# Patient Record
Sex: Male | Born: 1935 | Race: White | Hispanic: No | Marital: Married | Smoking: Never smoker
Health system: Southern US, Community
[De-identification: ages and names within clinical notes are randomized; demographics above are authoritative.]

## PROBLEM LIST (undated history)

## (undated) DIAGNOSIS — K5790 Diverticulosis of intestine, part unspecified, without perforation or abscess without bleeding: Secondary | ICD-10-CM

## (undated) DIAGNOSIS — I471 Supraventricular tachycardia, unspecified: Secondary | ICD-10-CM

## (undated) DIAGNOSIS — R001 Bradycardia, unspecified: Secondary | ICD-10-CM

## (undated) DIAGNOSIS — K589 Irritable bowel syndrome without diarrhea: Secondary | ICD-10-CM

## (undated) DIAGNOSIS — N400 Enlarged prostate without lower urinary tract symptoms: Secondary | ICD-10-CM

## (undated) DIAGNOSIS — E7800 Pure hypercholesterolemia, unspecified: Secondary | ICD-10-CM

## (undated) DIAGNOSIS — N200 Calculus of kidney: Secondary | ICD-10-CM

## (undated) DIAGNOSIS — M25552 Pain in left hip: Secondary | ICD-10-CM

## (undated) DIAGNOSIS — M1990 Unspecified osteoarthritis, unspecified site: Secondary | ICD-10-CM

## (undated) DIAGNOSIS — D369 Benign neoplasm, unspecified site: Secondary | ICD-10-CM

## (undated) DIAGNOSIS — I2510 Atherosclerotic heart disease of native coronary artery without angina pectoris: Secondary | ICD-10-CM

## (undated) DIAGNOSIS — I5022 Chronic systolic (congestive) heart failure: Secondary | ICD-10-CM

## (undated) DIAGNOSIS — I10 Essential (primary) hypertension: Secondary | ICD-10-CM

## (undated) DIAGNOSIS — N183 Chronic kidney disease, stage 3 unspecified: Secondary | ICD-10-CM

## (undated) DIAGNOSIS — I219 Acute myocardial infarction, unspecified: Secondary | ICD-10-CM

## (undated) DIAGNOSIS — I255 Ischemic cardiomyopathy: Secondary | ICD-10-CM

## (undated) DIAGNOSIS — K219 Gastro-esophageal reflux disease without esophagitis: Secondary | ICD-10-CM

## (undated) DIAGNOSIS — G4730 Sleep apnea, unspecified: Secondary | ICD-10-CM

## (undated) HISTORY — DX: Pure hypercholesterolemia, unspecified: E78.00

## (undated) HISTORY — DX: Benign neoplasm, unspecified site: D36.9

## (undated) HISTORY — PX: TRANSURETHRAL RESECTION OF PROSTATE: SHX73

## (undated) HISTORY — DX: Atherosclerotic heart disease of native coronary artery without angina pectoris: I25.10

## (undated) HISTORY — PX: BACK SURGERY: SHX140

## (undated) HISTORY — DX: Pain in left hip: M25.552

## (undated) HISTORY — DX: Acute myocardial infarction, unspecified: I21.9

## (undated) HISTORY — PX: APPENDECTOMY: SHX54

## (undated) HISTORY — PX: FINGER SURGERY: SHX640

## (undated) HISTORY — DX: Benign prostatic hyperplasia without lower urinary tract symptoms: N40.0

## (undated) HISTORY — DX: Ischemic cardiomyopathy: I25.5

## (undated) HISTORY — DX: Supraventricular tachycardia, unspecified: I47.10

## (undated) HISTORY — PX: JOINT REPLACEMENT: SHX530

## (undated) HISTORY — DX: Unspecified osteoarthritis, unspecified site: M19.90

## (undated) HISTORY — DX: Calculus of kidney: N20.0

## (undated) HISTORY — DX: Irritable bowel syndrome, unspecified: K58.9

## (undated) HISTORY — DX: Diverticulosis of intestine, part unspecified, without perforation or abscess without bleeding: K57.90

## (undated) HISTORY — DX: Supraventricular tachycardia: I47.1

## (undated) HISTORY — PX: TONSILLECTOMY: SUR1361

---

## 1998-05-25 ENCOUNTER — Other Ambulatory Visit: Admission: RE | Admit: 1998-05-25 | Discharge: 1998-05-25 | Payer: Self-pay | Admitting: Urology

## 1999-01-25 ENCOUNTER — Ambulatory Visit (HOSPITAL_BASED_OUTPATIENT_CLINIC_OR_DEPARTMENT_OTHER): Admission: RE | Admit: 1999-01-25 | Discharge: 1999-01-25 | Payer: Self-pay | Admitting: Surgery

## 1999-09-26 ENCOUNTER — Emergency Department (HOSPITAL_COMMUNITY): Admission: EM | Admit: 1999-09-26 | Discharge: 1999-09-26 | Payer: Self-pay | Admitting: Emergency Medicine

## 1999-10-02 ENCOUNTER — Ambulatory Visit (HOSPITAL_COMMUNITY): Admission: RE | Admit: 1999-10-02 | Discharge: 1999-10-02 | Payer: Self-pay | Admitting: Orthopedic Surgery

## 1999-10-02 ENCOUNTER — Encounter: Payer: Self-pay | Admitting: Orthopedic Surgery

## 1999-10-12 ENCOUNTER — Inpatient Hospital Stay (HOSPITAL_COMMUNITY): Admission: RE | Admit: 1999-10-12 | Discharge: 1999-10-13 | Payer: Self-pay | Admitting: Neurosurgery

## 1999-10-12 ENCOUNTER — Encounter: Payer: Self-pay | Admitting: Neurosurgery

## 1999-12-17 DIAGNOSIS — I219 Acute myocardial infarction, unspecified: Secondary | ICD-10-CM

## 1999-12-17 HISTORY — DX: Acute myocardial infarction, unspecified: I21.9

## 2000-10-28 ENCOUNTER — Ambulatory Visit (HOSPITAL_COMMUNITY): Admission: RE | Admit: 2000-10-28 | Discharge: 2000-10-28 | Payer: Self-pay | Admitting: Cardiology

## 2000-10-28 ENCOUNTER — Encounter: Payer: Self-pay | Admitting: Cardiology

## 2000-12-16 HISTORY — PX: ANGIOPLASTY: SHX39

## 2001-01-29 ENCOUNTER — Encounter: Payer: Self-pay | Admitting: Urology

## 2001-01-29 ENCOUNTER — Encounter: Admission: RE | Admit: 2001-01-29 | Discharge: 2001-01-29 | Payer: Self-pay | Admitting: Urology

## 2001-05-21 ENCOUNTER — Encounter (INDEPENDENT_AMBULATORY_CARE_PROVIDER_SITE_OTHER): Payer: Self-pay | Admitting: Specialist

## 2001-05-21 ENCOUNTER — Other Ambulatory Visit: Admission: RE | Admit: 2001-05-21 | Discharge: 2001-05-21 | Payer: Self-pay | Admitting: Internal Medicine

## 2001-10-11 ENCOUNTER — Encounter: Payer: Self-pay | Admitting: Cardiology

## 2001-10-11 ENCOUNTER — Inpatient Hospital Stay (HOSPITAL_COMMUNITY): Admission: EM | Admit: 2001-10-11 | Discharge: 2001-10-17 | Payer: Self-pay | Admitting: Emergency Medicine

## 2001-10-27 ENCOUNTER — Encounter (HOSPITAL_COMMUNITY): Admission: RE | Admit: 2001-10-27 | Discharge: 2002-01-25 | Payer: Self-pay | Admitting: *Deleted

## 2002-01-26 ENCOUNTER — Encounter (HOSPITAL_COMMUNITY): Admission: RE | Admit: 2002-01-26 | Discharge: 2002-01-28 | Payer: Self-pay | Admitting: *Deleted

## 2002-02-12 ENCOUNTER — Encounter: Payer: Self-pay | Admitting: Emergency Medicine

## 2002-02-13 ENCOUNTER — Inpatient Hospital Stay (HOSPITAL_COMMUNITY): Admission: EM | Admit: 2002-02-13 | Discharge: 2002-02-16 | Payer: Self-pay | Admitting: Emergency Medicine

## 2002-02-14 ENCOUNTER — Encounter: Payer: Self-pay | Admitting: Cardiovascular Disease

## 2002-04-18 ENCOUNTER — Emergency Department (HOSPITAL_COMMUNITY): Admission: EM | Admit: 2002-04-18 | Discharge: 2002-04-18 | Payer: Self-pay | Admitting: Emergency Medicine

## 2002-04-20 ENCOUNTER — Encounter: Admission: RE | Admit: 2002-04-20 | Discharge: 2002-04-20 | Payer: Self-pay | Admitting: Urology

## 2002-04-20 ENCOUNTER — Encounter: Payer: Self-pay | Admitting: Urology

## 2002-05-07 ENCOUNTER — Encounter: Payer: Self-pay | Admitting: Emergency Medicine

## 2002-05-07 ENCOUNTER — Inpatient Hospital Stay (HOSPITAL_COMMUNITY): Admission: EM | Admit: 2002-05-07 | Discharge: 2002-05-08 | Payer: Self-pay | Admitting: Emergency Medicine

## 2002-05-07 HISTORY — PX: CARDIAC CATHETERIZATION: SHX172

## 2003-09-06 ENCOUNTER — Emergency Department (HOSPITAL_COMMUNITY): Admission: EM | Admit: 2003-09-06 | Discharge: 2003-09-06 | Payer: Self-pay | Admitting: Emergency Medicine

## 2005-01-02 ENCOUNTER — Ambulatory Visit (HOSPITAL_COMMUNITY): Admission: RE | Admit: 2005-01-02 | Discharge: 2005-01-02 | Payer: Self-pay | Admitting: Neurosurgery

## 2005-01-10 ENCOUNTER — Ambulatory Visit: Payer: Self-pay | Admitting: Internal Medicine

## 2005-01-11 ENCOUNTER — Ambulatory Visit: Payer: Self-pay | Admitting: Internal Medicine

## 2005-01-18 ENCOUNTER — Encounter: Admission: RE | Admit: 2005-01-18 | Discharge: 2005-02-14 | Payer: Self-pay | Admitting: Neurosurgery

## 2005-03-20 ENCOUNTER — Ambulatory Visit (HOSPITAL_COMMUNITY): Admission: RE | Admit: 2005-03-20 | Discharge: 2005-03-20 | Payer: Self-pay | Admitting: Neurosurgery

## 2005-10-03 ENCOUNTER — Ambulatory Visit: Payer: Self-pay | Admitting: Internal Medicine

## 2006-01-21 ENCOUNTER — Ambulatory Visit (HOSPITAL_COMMUNITY): Admission: RE | Admit: 2006-01-21 | Discharge: 2006-01-21 | Payer: Self-pay | Admitting: Neurosurgery

## 2006-04-29 ENCOUNTER — Ambulatory Visit (HOSPITAL_BASED_OUTPATIENT_CLINIC_OR_DEPARTMENT_OTHER): Admission: RE | Admit: 2006-04-29 | Discharge: 2006-04-29 | Payer: Self-pay | Admitting: Cardiology

## 2006-05-04 ENCOUNTER — Ambulatory Visit: Payer: Self-pay | Admitting: Internal Medicine

## 2006-09-04 ENCOUNTER — Encounter: Admission: RE | Admit: 2006-09-04 | Discharge: 2006-09-04 | Payer: Self-pay | Admitting: Neurosurgery

## 2006-09-30 ENCOUNTER — Encounter: Admission: RE | Admit: 2006-09-30 | Discharge: 2006-09-30 | Payer: Self-pay | Admitting: Neurosurgery

## 2006-12-15 ENCOUNTER — Ambulatory Visit: Payer: Self-pay | Admitting: Internal Medicine

## 2007-01-28 ENCOUNTER — Ambulatory Visit: Payer: Self-pay | Admitting: Internal Medicine

## 2007-02-03 ENCOUNTER — Ambulatory Visit: Payer: Self-pay | Admitting: Internal Medicine

## 2007-03-11 ENCOUNTER — Ambulatory Visit (HOSPITAL_COMMUNITY): Admission: RE | Admit: 2007-03-11 | Discharge: 2007-03-11 | Payer: Self-pay | Admitting: Internal Medicine

## 2007-03-17 ENCOUNTER — Ambulatory Visit: Payer: Self-pay | Admitting: Internal Medicine

## 2007-04-21 ENCOUNTER — Encounter: Admission: RE | Admit: 2007-04-21 | Discharge: 2007-04-21 | Payer: Self-pay | Admitting: Orthopedic Surgery

## 2007-05-12 ENCOUNTER — Encounter: Admission: RE | Admit: 2007-05-12 | Discharge: 2007-05-12 | Payer: Self-pay | Admitting: Orthopedic Surgery

## 2007-05-13 ENCOUNTER — Ambulatory Visit (HOSPITAL_BASED_OUTPATIENT_CLINIC_OR_DEPARTMENT_OTHER): Admission: RE | Admit: 2007-05-13 | Discharge: 2007-05-13 | Payer: Self-pay | Admitting: Orthopedic Surgery

## 2007-05-13 ENCOUNTER — Encounter (INDEPENDENT_AMBULATORY_CARE_PROVIDER_SITE_OTHER): Payer: Self-pay | Admitting: Orthopedic Surgery

## 2008-02-25 ENCOUNTER — Encounter: Payer: Self-pay | Admitting: *Deleted

## 2008-02-25 DIAGNOSIS — M6530 Trigger finger, unspecified finger: Secondary | ICD-10-CM | POA: Insufficient documentation

## 2008-02-25 DIAGNOSIS — N138 Other obstructive and reflux uropathy: Secondary | ICD-10-CM

## 2008-02-25 DIAGNOSIS — Z8669 Personal history of other diseases of the nervous system and sense organs: Secondary | ICD-10-CM | POA: Insufficient documentation

## 2008-02-25 DIAGNOSIS — Z9889 Other specified postprocedural states: Secondary | ICD-10-CM | POA: Insufficient documentation

## 2008-02-25 DIAGNOSIS — K5730 Diverticulosis of large intestine without perforation or abscess without bleeding: Secondary | ICD-10-CM

## 2008-02-25 DIAGNOSIS — N401 Enlarged prostate with lower urinary tract symptoms: Secondary | ICD-10-CM

## 2008-02-25 DIAGNOSIS — E785 Hyperlipidemia, unspecified: Secondary | ICD-10-CM | POA: Insufficient documentation

## 2008-02-25 DIAGNOSIS — Z9861 Coronary angioplasty status: Secondary | ICD-10-CM

## 2008-02-25 DIAGNOSIS — I219 Acute myocardial infarction, unspecified: Secondary | ICD-10-CM

## 2008-02-25 DIAGNOSIS — K219 Gastro-esophageal reflux disease without esophagitis: Secondary | ICD-10-CM

## 2008-02-25 DIAGNOSIS — I25119 Atherosclerotic heart disease of native coronary artery with unspecified angina pectoris: Secondary | ICD-10-CM

## 2008-02-25 DIAGNOSIS — Z9189 Other specified personal risk factors, not elsewhere classified: Secondary | ICD-10-CM

## 2008-02-25 DIAGNOSIS — Z9089 Acquired absence of other organs: Secondary | ICD-10-CM

## 2008-02-25 DIAGNOSIS — D126 Benign neoplasm of colon, unspecified: Secondary | ICD-10-CM

## 2008-02-25 DIAGNOSIS — I110 Hypertensive heart disease with heart failure: Secondary | ICD-10-CM

## 2008-02-25 DIAGNOSIS — R7309 Other abnormal glucose: Secondary | ICD-10-CM | POA: Insufficient documentation

## 2008-02-25 DIAGNOSIS — I471 Supraventricular tachycardia: Secondary | ICD-10-CM

## 2008-02-25 DIAGNOSIS — M479 Spondylosis, unspecified: Secondary | ICD-10-CM

## 2008-02-25 DIAGNOSIS — Z87442 Personal history of urinary calculi: Secondary | ICD-10-CM | POA: Insufficient documentation

## 2008-02-25 HISTORY — DX: Hypertensive heart disease with heart failure: I11.0

## 2008-02-25 HISTORY — DX: Other specified personal risk factors, not elsewhere classified: Z91.89

## 2008-02-25 HISTORY — DX: Gastro-esophageal reflux disease without esophagitis: K21.9

## 2008-02-25 HISTORY — DX: Acute myocardial infarction, unspecified: I21.9

## 2008-02-25 HISTORY — DX: Coronary angioplasty status: Z98.61

## 2008-02-25 HISTORY — DX: Benign neoplasm of colon, unspecified: D12.6

## 2008-02-25 HISTORY — DX: Atherosclerotic heart disease of native coronary artery with unspecified angina pectoris: I25.119

## 2008-02-25 HISTORY — DX: Personal history of urinary calculi: Z87.442

## 2008-02-25 HISTORY — DX: Other abnormal glucose: R73.09

## 2008-02-25 HISTORY — DX: Diverticulosis of large intestine without perforation or abscess without bleeding: K57.30

## 2008-02-25 HISTORY — DX: Acquired absence of other organs: Z90.89

## 2008-02-25 HISTORY — DX: Spondylosis, unspecified: M47.9

## 2008-02-25 HISTORY — DX: Hyperlipidemia, unspecified: E78.5

## 2008-02-25 HISTORY — DX: Other obstructive and reflux uropathy: N13.8

## 2008-02-25 HISTORY — DX: Trigger finger, unspecified finger: M65.30

## 2008-02-25 HISTORY — DX: Other specified postprocedural states: Z98.89

## 2008-10-31 ENCOUNTER — Telehealth: Payer: Self-pay | Admitting: Internal Medicine

## 2008-11-02 ENCOUNTER — Telehealth (INDEPENDENT_AMBULATORY_CARE_PROVIDER_SITE_OTHER): Payer: Self-pay | Admitting: *Deleted

## 2008-11-03 ENCOUNTER — Ambulatory Visit: Payer: Self-pay | Admitting: Internal Medicine

## 2008-11-03 DIAGNOSIS — R059 Cough, unspecified: Secondary | ICD-10-CM

## 2008-11-03 DIAGNOSIS — R05 Cough: Secondary | ICD-10-CM | POA: Insufficient documentation

## 2008-11-03 HISTORY — DX: Cough, unspecified: R05.9

## 2008-11-07 ENCOUNTER — Inpatient Hospital Stay (HOSPITAL_COMMUNITY): Admission: RE | Admit: 2008-11-07 | Discharge: 2008-11-10 | Payer: Self-pay | Admitting: Orthopedic Surgery

## 2008-11-08 ENCOUNTER — Telehealth: Payer: Self-pay | Admitting: Internal Medicine

## 2008-11-11 ENCOUNTER — Telehealth: Payer: Self-pay | Admitting: Internal Medicine

## 2009-05-18 ENCOUNTER — Ambulatory Visit (HOSPITAL_COMMUNITY): Admission: EM | Admit: 2009-05-18 | Discharge: 2009-05-18 | Payer: Self-pay | Admitting: Emergency Medicine

## 2009-06-14 ENCOUNTER — Ambulatory Visit (HOSPITAL_COMMUNITY): Admission: EM | Admit: 2009-06-14 | Discharge: 2009-06-15 | Payer: Self-pay | Admitting: Emergency Medicine

## 2009-07-11 ENCOUNTER — Telehealth: Payer: Self-pay | Admitting: Internal Medicine

## 2009-10-05 IMAGING — CR DG HIP 1V PORT*R*
1 series · 1 of 1 positions shown · non-contrast
Comparison: 10/27/2008

CLINICAL DATA: Postoperative status post right total hip
arthroplasty

PORTABLE RIGHT HIP - 1 VIEW

[view not recorded]
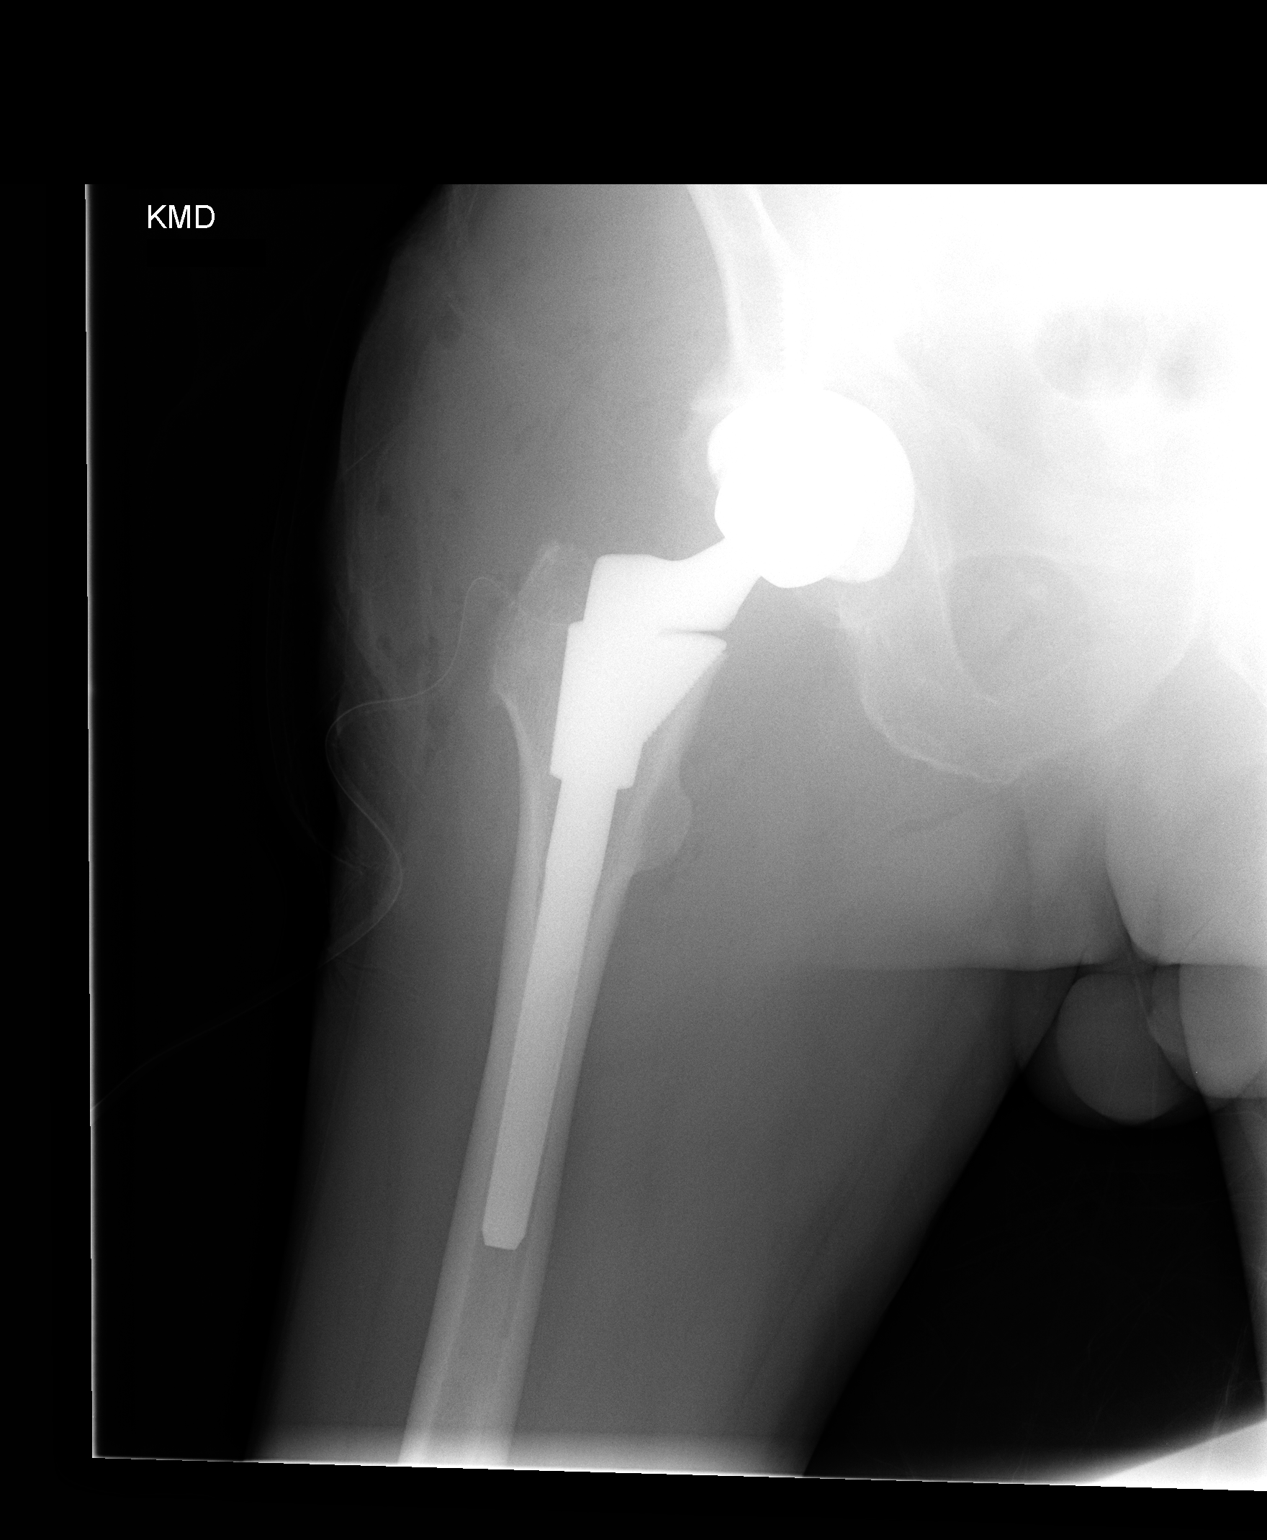

[1 of 1 positions shown; findings below may reference images not displayed]

FINDINGS: Right total hip arthroplasty noted without fracture line
or radiographic evidence for hardware failure.
IMPRESSION: Expected postoperative appearance after right total hip
arthroplasty.

## 2010-05-13 ENCOUNTER — Observation Stay (HOSPITAL_COMMUNITY): Admission: EM | Admit: 2010-05-13 | Discharge: 2010-05-14 | Payer: Self-pay | Admitting: Emergency Medicine

## 2010-08-10 ENCOUNTER — Ambulatory Visit: Payer: Self-pay | Admitting: Cardiology

## 2011-01-25 ENCOUNTER — Emergency Department (HOSPITAL_COMMUNITY): Payer: Medicare Other

## 2011-01-25 ENCOUNTER — Observation Stay (HOSPITAL_COMMUNITY)
Admission: EM | Admit: 2011-01-25 | Discharge: 2011-01-25 | Disposition: A | Discharge status: Home / Self Care | Payer: Medicare Other | Attending: Specialist | Admitting: Specialist

## 2011-01-25 DIAGNOSIS — X500XXA Overexertion from strenuous movement or load, initial encounter: Secondary | ICD-10-CM | POA: Insufficient documentation

## 2011-01-25 DIAGNOSIS — Z01812 Encounter for preprocedural laboratory examination: Secondary | ICD-10-CM | POA: Insufficient documentation

## 2011-01-25 DIAGNOSIS — I10 Essential (primary) hypertension: Secondary | ICD-10-CM | POA: Insufficient documentation

## 2011-01-25 DIAGNOSIS — Z96649 Presence of unspecified artificial hip joint: Secondary | ICD-10-CM | POA: Insufficient documentation

## 2011-01-25 DIAGNOSIS — T84029A Dislocation of unspecified internal joint prosthesis, initial encounter: Principal | ICD-10-CM | POA: Insufficient documentation

## 2011-01-25 DIAGNOSIS — K219 Gastro-esophageal reflux disease without esophagitis: Secondary | ICD-10-CM | POA: Insufficient documentation

## 2011-01-25 DIAGNOSIS — G4730 Sleep apnea, unspecified: Secondary | ICD-10-CM | POA: Insufficient documentation

## 2011-01-25 DIAGNOSIS — I2510 Atherosclerotic heart disease of native coronary artery without angina pectoris: Secondary | ICD-10-CM | POA: Insufficient documentation

## 2011-01-25 DIAGNOSIS — Z01810 Encounter for preprocedural cardiovascular examination: Secondary | ICD-10-CM | POA: Insufficient documentation

## 2011-01-25 DIAGNOSIS — I252 Old myocardial infarction: Secondary | ICD-10-CM | POA: Insufficient documentation

## 2011-01-25 LAB — CBC
MCH: 30.6 pg (ref 26.0–34.0)
MCHC: 34.3 g/dL (ref 30.0–36.0)
MCV: 89.4 fL (ref 78.0–100.0)
RBC: 4.34 MIL/uL (ref 4.22–5.81)
RDW: 12.9 % (ref 11.5–15.5)
WBC: 8.8 10*3/uL (ref 4.0–10.5)

## 2011-01-25 LAB — DIFFERENTIAL
Basophils Absolute: 0 10*3/uL (ref 0.0–0.1)
Eosinophils Relative: 1 % (ref 0–5)
Lymphocytes Relative: 12 % (ref 12–46)
Lymphs Abs: 1 10*3/uL (ref 0.7–4.0)
Monocytes Absolute: 0.6 10*3/uL (ref 0.1–1.0)
Neutro Abs: 7 10*3/uL (ref 1.7–7.7)
Neutrophils Relative %: 80 % — ABNORMAL HIGH (ref 43–77)

## 2011-01-25 LAB — BASIC METABOLIC PANEL
CO2: 26 mEq/L (ref 19–32)
GFR calc non Af Amer: 50 mL/min — ABNORMAL LOW (ref >60)
Glucose, Bld: 105 mg/dL — ABNORMAL HIGH (ref 70–99)
Potassium: 4.6 mEq/L (ref 3.5–5.1)
Sodium: 139 mEq/L (ref 135–145)

## 2011-02-12 ENCOUNTER — Ambulatory Visit (INDEPENDENT_AMBULATORY_CARE_PROVIDER_SITE_OTHER): Payer: Medicare Other | Admitting: Cardiology

## 2011-02-12 DIAGNOSIS — Z79899 Other long term (current) drug therapy: Secondary | ICD-10-CM

## 2011-02-12 DIAGNOSIS — I471 Supraventricular tachycardia: Secondary | ICD-10-CM

## 2011-02-12 DIAGNOSIS — I252 Old myocardial infarction: Secondary | ICD-10-CM

## 2011-02-12 DIAGNOSIS — E7800 Pure hypercholesterolemia, unspecified: Secondary | ICD-10-CM

## 2011-02-13 ENCOUNTER — Ambulatory Visit: Payer: Self-pay | Admitting: Cardiology

## 2011-02-27 ENCOUNTER — Telehealth (INDEPENDENT_AMBULATORY_CARE_PROVIDER_SITE_OTHER): Payer: Self-pay | Admitting: *Deleted

## 2011-02-28 ENCOUNTER — Encounter: Payer: Self-pay | Admitting: Internal Medicine

## 2011-02-28 ENCOUNTER — Ambulatory Visit (HOSPITAL_COMMUNITY): Payer: Medicare Other | Attending: Internal Medicine

## 2011-02-28 DIAGNOSIS — I498 Other specified cardiac arrhythmias: Secondary | ICD-10-CM

## 2011-02-28 DIAGNOSIS — I2510 Atherosclerotic heart disease of native coronary artery without angina pectoris: Secondary | ICD-10-CM | POA: Insufficient documentation

## 2011-02-28 DIAGNOSIS — I4949 Other premature depolarization: Secondary | ICD-10-CM

## 2011-02-28 DIAGNOSIS — R0602 Shortness of breath: Secondary | ICD-10-CM

## 2011-03-05 NOTE — Assessment & Plan Note (Signed)
Summary: Cardiology Nuclear Testing  Nuclear Med Background Indications for Stress Test: Evaluation for Ischemia, Surgical Clearance, Stent Patency, PTCA Patency  Indications Comments: Pending hip Surgery: Dr. Bevely Palmer  History: Ablation, Angioplasty, Heart Catheterization, Myocardial Infarction, Myocardial Perfusion Study, Stents  History Comments: '02 LAD stent '03 Heart Cath: EF= 20%, angioplasty '09 MPS: NL, EF= 38%  Symptoms: DOE, Fatigue, Palpitations    Nuclear Pre-Procedure Cardiac Risk Factors: Hypertension, Lipids Caffeine/Decaff Intake: None NPO After: 5:30 PM Lungs: clear IV 0.9% NS with Angio Cath: 22g     IV Site: R Forearm IV Started by: Bonnita Levan, RN Chest Size (in) 46     Height (in): 64 Weight (lb): 168 BMI: 28.94  Nuclear Med Study 1 or 2 day study:  1 day     Stress Test Type:  Lexiscan Reading MD:  Dietrich Pates, MD     Referring MD:  T.Brackbill Resting Radionuclide:  Technetium 27m Tetrofosmin     Resting Radionuclide Dose:  10.7 mCi  Stress Radionuclide:  Technetium 16m Tetrofosmin     Stress Radionuclide Dose:  33.0 mCi   Stress Protocol  Max Systolic BP: 178 mm Hg Lexiscan: 0.4 mg   Stress Test Technologist:  Milana Na, EMT-P     Nuclear Technologist:  Domenic Polite, CNMT  Rest Procedure  Myocardial perfusion imaging was performed at rest 45 minutes following the intravenous administration of Technetium 45m Tetrofosmin.  Stress Procedure  The patient received IV Lexiscan 0.4 mg over 15-seconds.  Technetium 55m Tetrofosmin injected at 30-seconds.  There were non specific changes and a rare pac with infusion.  Quantitative spect images were obtained after a 45 minute delay.  QPS Raw Data Images:  soft tissue (diaphragm, bowel activity) underlie heart. Stress Images:  Large defect involving anterior wall (base, mid, distal), anteroseptal wall (base, mid, distal), septal wall)base, mid, distal), anterolateral wall (mid, distal) and  apex. Rest Images:  Comparison with the stress images reveals no significant change. Subtraction (SDS):  No evidence of ischemia. Transient Ischemic Dilatation:  0.99  (Normal <1.22)  Lung/Heart Ratio:  0.26  (Normal <0.45)  Quantitative Gated Spect Images QGS EDV:  229 ml QGS ESV:  153 ml QGS EF:  33 %   Overall Impression  Exercise Capacity: Lexiscan with low level exercise BP Response: Normal blood pressure response. Clinical Symptoms: No chest pain ECG Impression: No significant ST T wave changes. Overall Impression Comments: Extensive scar in the anteriior, anterolateral, anteroseptal, septal and apical walls.  No evidence of ischemia.  LVEF calculated at 33%

## 2011-03-05 NOTE — Progress Notes (Signed)
Summary: Nuclear Pre-Procedure  Phone Note Outgoing Call Call back at [facility] Phone [phone]   Call placed by: Stanton Kidney, EMT-P,  February 27, 2011 1:32 PM Action Taken: Phone Call Completed Summary of Call: Reviewed information on Myoview Information Sheet (see scanned document for further details).  Spoke with the patient's wife. Stanton Kidney, EMT-P  February 27, 2011 1:32 PM      Nuclear Med Background Indications for Stress Test: Evaluation for Ischemia, Surgical Clearance, Stent Patency, PTCA Patency  Indications Comments: Pending hip Surgery: Dr. Bevely Palmer  History: Angioplasty, Heart Catheterization, Myocardial Infarction, Myocardial Perfusion Study, Stents  History Comments: '02 LAD stent '03 Heart Cath: EF= 20%, angioplasty '09 MPS: NL, EF= 38%     Nuclear Pre-Procedure Cardiac Risk Factors: Hypertension, Lipids Height (in): 64

## 2011-03-07 ENCOUNTER — Other Ambulatory Visit: Payer: Self-pay | Admitting: *Deleted

## 2011-03-07 DIAGNOSIS — N400 Enlarged prostate without lower urinary tract symptoms: Secondary | ICD-10-CM

## 2011-03-07 MED ORDER — FINASTERIDE 5 MG PO TABS: 5.0000 mg | ORAL_TABLET | Freq: Every day | ORAL | Status: DC

## 2011-03-07 NOTE — Telephone Encounter (Signed)
Refilled meds per fax request.  

## 2011-03-25 LAB — DIFFERENTIAL
Eosinophils Absolute: 0.2 10*3/uL (ref 0.0–0.7)
Eosinophils Absolute: 0.1 10*3/uL (ref 0.0–0.7)
Eosinophils Relative: 3 % (ref 0–5)
Eosinophils Relative: 2 % (ref 0–5)
Lymphocytes Relative: 12 % (ref 12–46)
Lymphs Abs: 1 10*3/uL (ref 0.7–4.0)
Lymphs Abs: 0.9 10*3/uL (ref 0.7–4.0)
Monocytes Absolute: 0.6 10*3/uL (ref 0.1–1.0)
Monocytes Relative: 10 % (ref 3–12)
Monocytes Relative: 9 % (ref 3–12)

## 2011-03-25 LAB — BASIC METABOLIC PANEL
BUN: 35 mg/dL — ABNORMAL HIGH (ref 6–23)
Chloride: 111 mEq/L (ref 96–112)
Chloride: 108 mEq/L (ref 96–112)
GFR calc Af Amer: 52 mL/min — ABNORMAL LOW (ref >60)
GFR calc non Af Amer: 43 mL/min — ABNORMAL LOW (ref >60)
Potassium: 4.4 mEq/L (ref 3.5–5.1)
Potassium: 4.6 mEq/L (ref 3.5–5.1)
Sodium: 140 mEq/L (ref 135–145)

## 2011-03-25 LAB — CBC
HCT: 35.5 % — ABNORMAL LOW (ref 39.0–52.0)
HCT: 38 % — ABNORMAL LOW (ref 39.0–52.0)
Hemoglobin: 11.9 g/dL — ABNORMAL LOW (ref 13.0–17.0)
Hemoglobin: 12.3 g/dL — ABNORMAL LOW (ref 13.0–17.0)
MCV: 91 fL (ref 78.0–100.0)
MCV: 89.8 fL (ref 78.0–100.0)
Platelets: 135 10*3/uL — ABNORMAL LOW (ref 150–400)
RBC: 4.24 MIL/uL (ref 4.22–5.81)
WBC: 6.4 10*3/uL (ref 4.0–10.5)
WBC: 7.2 10*3/uL (ref 4.0–10.5)

## 2011-03-25 LAB — PROTIME-INR
INR: 1.1 (ref 0.00–1.49)
Prothrombin Time: 14.5 seconds (ref 11.6–15.2)

## 2011-03-25 LAB — TYPE AND SCREEN

## 2011-03-30 ENCOUNTER — Other Ambulatory Visit: Payer: Self-pay | Admitting: Cardiology

## 2011-03-30 DIAGNOSIS — I10 Essential (primary) hypertension: Secondary | ICD-10-CM

## 2011-04-01 NOTE — Telephone Encounter (Signed)
Refill request

## 2011-04-15 ENCOUNTER — Telehealth: Payer: Self-pay | Admitting: Cardiology

## 2011-04-15 NOTE — Telephone Encounter (Signed)
NEEDS SAMPLES OF CRESTOR 5MG ,  PLAVIX ,   CELEBRIX 200MG 

## 2011-04-15 NOTE — Telephone Encounter (Signed)
Samples gathered for patient 

## 2011-04-23 ENCOUNTER — Other Ambulatory Visit: Payer: Self-pay | Admitting: Orthopedic Surgery

## 2011-04-23 ENCOUNTER — Other Ambulatory Visit (HOSPITAL_COMMUNITY): Payer: Self-pay | Admitting: Orthopedic Surgery

## 2011-04-23 ENCOUNTER — Ambulatory Visit (HOSPITAL_COMMUNITY)
Admission: RE | Admit: 2011-04-23 | Discharge: 2011-04-23 | Disposition: A | Discharge status: Home / Self Care | Payer: Medicare Other | Source: Ambulatory Visit | Attending: Orthopedic Surgery | Admitting: Orthopedic Surgery

## 2011-04-23 ENCOUNTER — Encounter (HOSPITAL_COMMUNITY): Payer: Medicare Other

## 2011-04-23 DIAGNOSIS — M47814 Spondylosis without myelopathy or radiculopathy, thoracic region: Secondary | ICD-10-CM | POA: Insufficient documentation

## 2011-04-23 DIAGNOSIS — Z01818 Encounter for other preprocedural examination: Secondary | ICD-10-CM

## 2011-04-23 DIAGNOSIS — Z01811 Encounter for preprocedural respiratory examination: Secondary | ICD-10-CM

## 2011-04-23 DIAGNOSIS — J984 Other disorders of lung: Secondary | ICD-10-CM | POA: Insufficient documentation

## 2011-04-23 DIAGNOSIS — I10 Essential (primary) hypertension: Secondary | ICD-10-CM | POA: Insufficient documentation

## 2011-04-23 LAB — COMPREHENSIVE METABOLIC PANEL
AST: 28 U/L (ref 0–37)
Albumin: 3.6 g/dL (ref 3.5–5.2)
BUN: 25 mg/dL — ABNORMAL HIGH (ref 6–23)
Calcium: 9.7 mg/dL (ref 8.4–10.5)
Chloride: 103 mEq/L (ref 96–112)
Creatinine, Ser: 1.56 mg/dL — ABNORMAL HIGH (ref 0.4–1.5)
GFR calc Af Amer: 52 mL/min — ABNORMAL LOW (ref >60)
Total Protein: 6.4 g/dL (ref 6.0–8.3)

## 2011-04-23 LAB — CBC
Hemoglobin: 13 g/dL (ref 13.0–17.0)
MCH: 29.7 pg (ref 26.0–34.0)
MCV: 90.6 fL (ref 78.0–100.0)
Platelets: 151 10*3/uL (ref 150–400)
RBC: 4.38 MIL/uL (ref 4.22–5.81)
WBC: 6.3 10*3/uL (ref 4.0–10.5)

## 2011-04-23 LAB — URINALYSIS, ROUTINE W REFLEX MICROSCOPIC
Glucose, UA: NEGATIVE mg/dL
Ketones, ur: NEGATIVE mg/dL
Nitrite: NEGATIVE
Specific Gravity, Urine: 1.019 (ref 1.005–1.030)
pH: 5.5 (ref 5.0–8.0)

## 2011-04-23 LAB — PROTIME-INR: INR: 1 (ref 0.00–1.49)

## 2011-04-23 LAB — SURGICAL PCR SCREEN: MRSA, PCR: NEGATIVE

## 2011-04-23 LAB — APTT: aPTT: 29 seconds (ref 24–37)

## 2011-04-27 ENCOUNTER — Other Ambulatory Visit: Payer: Self-pay | Admitting: Cardiology

## 2011-04-27 DIAGNOSIS — N400 Enlarged prostate without lower urinary tract symptoms: Secondary | ICD-10-CM

## 2011-04-29 NOTE — Telephone Encounter (Signed)
escribe request  

## 2011-04-30 NOTE — Op Note (Signed)
NAMEALVON, NYGAARD NO.:  [id number]   MEDICAL RECORD NO.:  [id number]          PATIENT TYPE:  INP   LOCATION:  0098                         FACILITY:  [facility]   PHYSICIAN:  Erasmo Leventhal, M.D.DATE OF BIRTH:  [birth year]   DATE OF PROCEDURE:  05/18/2009  DATE OF DISCHARGE:  05/18/2009                               OPERATIVE REPORT   PREOPERATIVE DIAGNOSIS:  Dislocated right total hip arthroplasty.   POSTOPERATIVE DIAGNOSIS:  Dislocated right total hip arthroplasty.   PROCEDURES:  1. Closed reduction of dislocated total hip arthroplasty on the right.  2. C-Arm radiography stress fuse.   SURGEON:  Erasmo Leventhal, M.D.   ASSISTANT:  Joan Mayans, PA-C.   ANESTHESIA:  General.   BLOOD LOSS:  None.   COMPLICATIONS:  None.   DISPOSITION:  PACU, stable.   DETAILS:  The patient was counseled in the holding area, taken to the  operating room, put in the supine position, general anesthesia, placed  on the OR table.  He had a flexed, shortened externally rotated profile.  Pulses were intact.  Utilizing general traction and hip reduction  maneuvers, the hip was reduced anatomically with a palpable and audible  reduction.  Leg lengths were symmetric at this point in time.  The C-Arm  was utilized to confirm no complicating factors or problems.  In  addition, he was stable with 90 degrees of flexion and 30 degrees of  external rotation.  Fluoroscopy confirmed the hip was also  concentrically reduced and articulating properly.   He was placed in a knee immobilizer, awakened, and taken to the  operating room and having stable condition.   PLAN:  Restabilization, PACU, discharged home with knee immobilizer.  Use ice pack.  Will be discharged on Norco and Robaxin.  He will follow  up with Dr. Lavinia Sharps.  Call me if he has any problems.  He will also have  neurovascular examination in the PACU prior to discharge to home.     ______________________________  Erasmo Leventhal, M.D.    RAC/MEDQ  D:  05/18/2009  T:  05/18/2009  Job:  [id number]

## 2011-04-30 NOTE — Op Note (Signed)
NAMEGRAHAM, [name]              ACCOUNT NO.:  [id number]   MEDICAL RECORD NO.:  [id number]          PATIENT TYPE:  AMB   LOCATION:  DSC                          FACILITY:  [facility]   PHYSICIAN:  Cindee Salt, M.D.       DATE OF BIRTH:  06-02-1925   DATE OF PROCEDURE:  05/13/2007  DATE OF DISCHARGE:                               OPERATIVE REPORT   PREOPERATIVE DIAGNOSIS:  Mass right index finger.   POSTOPERATIVE DIAGNOSIS:  Mass right index finger.   OPERATION:  Tenosynovectomy flexor tendons, debridement of FDS with a  release A1 pulley right middle finger.   SURGEON:  Cindee Salt, M.D.   ASSISTANTCarolyne Fiscal, R.N.   ANESTHESIA:  Forearm based IV regional.   HISTORY:  The patient is a 77 year old male with a history of an  enlarging mass right index finger palmar aspect at metacarpophalangeal  joint crease.  He is desirous of removal being aware of risks and  complications including infection, recurrence, injury to arteries,  nerves, tendons, complete relief of symptoms, dystrophy.  Questions were  encouraged and answered preoperatively.  In the preoperative area the  patient is seen.  The mass marked by both the patient and surgeon.  Antibiotic given.   PROCEDURE:  The patient is brought to the operating room where a forearm  based IV regional anesthetic was carried out without difficulty.  Was  prepped using DuraPrep, supine position, right arm free.  An oblique  incision was made over the A1 pulley palm of the index finger right  hand, carried down through subcutaneous tissue.  Bleeders were  electrocauterized.  Neurovascular structures were identified and  protected.  A large cystic mass was immediately encountered.  This was  followed down to the flexor tendons.  This was proximal to the A1 pulley  and revealed a large tenosynovitis of the flexor tendons.  Significant  fraying of the flexor digitorum superficialis was noted.  This was  debrided.  The profundus tendon showed  no significant changes.  The  tenosynovial tissue was removed with both sharp and blunt dissection  using rongeur and sharp dissection.  The A1 pulley was then released on  its radial aspect.  A  small incision made centrally in the A2 pulley.  No further lesions were identified.  The finger placed through full  range of motion.  The wound was then copiously irrigated with saline.  The tenosynovial tissue was sent for pathological examination.  The skin  was then closed with interrupted 5-0 Vicryl Rapide sutures.  A sterile compressive dressing was applied.  The patient tolerated the  procedure well was taken to the recovery room for observation in  satisfactory condition.  He will be discharged home to return to the  [facility] [place] in 1 week on Vicodin.           ______________________________  Cindee Salt, M.D.     GK/MEDQ  D:  05/13/2007  T:  05/13/2007  Job:  [id number]   cc:   Rosalyn Gess. Norins, MD

## 2011-04-30 NOTE — H&P (Signed)
[name], ALRED NO.:  [id number]   MEDICAL RECORD NO.:  [id number]          PATIENT TYPE:  EMS   LOCATION:  ED                           FACILITY:  [facility]   PHYSICIAN:  Erasmo Leventhal, M.D.DATE OF BIRTH:  1925-02-26   DATE OF ADMISSION:  05/18/2009  DATE OF DISCHARGE:                              HISTORY & PHYSICAL   CHIEF COMPLAINT:  Dislocated right hip.   HISTORY OF PRESENT ILLNESS:  The patient is an 77 year old gentleman,  patient of Dr. Ollen Gross, who was working at [employer] attic earlier  this date when he states he bent over and jerked his right hip, he felt  it pop out of place.  He has had this happen in the past with his left  total hip arthroplasty many times and knew what he did.  He was able to  hobble and get to where he could get some assistance and he was brought  to the emergency room by ambulance.  X-rays confirmed that he did in  fact have a dislocated total hip arthroplasty on the right.   PAST MEDICAL HISTORY:  1. Hypertension.  2. Coronary artery disease with history of MI.  3. Tachycardia.  4. Reflux disease.  5. Sleep apnea.  6. Hypercholesterolemia.  7. BPH.  8. History of a left total hip arthroplasty.   SOCIAL HISTORY:  The patient married and lives with his wife.  No  tobacco or ETOH.   ALLERGIES:  PREDNISONE.   MEDICATIONS:  Diovan, Plavix, Toprol, Hytrin, aspirin, Crestor and  Celebrex.   REVIEW OF SYSTEMS:  At present time, he has no other complaints of any  other injuries.  He has no shortness breath, chest pains, irregular  heart rhythms or any neurologic complaints.   PHYSICAL EXAMINATION:  VITAL SIGNS:  Temperature is 98.6, pulses is 55,  respirations 14, blood pressure 146/82.  GENERAL:  This a healthy-appearing gentleman, conscious, alert and  appropriate, appears to be fairly comfortable on a hospital room gurney  with his legs flexed up.  HEENT:  Head was normocephalic.  Pupils equal and round  and gross  hearing was intact.  NECK:  Supple.  No palpable lymphadenopathy.  CHEST:  Lung sounds were clear and equal bilaterally.  HEART:  Regular rate and rhythm.  ABDOMEN:  Soft.  Bowel sounds present.  EXTREMITIES:  In the upper extremities, he had good range of motion of  his upper extremities without any difficulty.  Right hip had pain with  hip flexed up and slightly shortened as compared to the opposite side.  His distal leg was neurologically intact.  He had good gentle motion of  his left hip without any discomfort.   X-rays reveal he does have a dislocated right total hip arthroplasty.   IMPRESSION:  Right total hip arthroplasty dislocation, requiring a  closed reduction.   PLAN:  The patient will have routine preoperative workups and taken to  the OR under general anesthesia for a  closed reduction of the right  total hip arthroplasty at then to be discharged as an outpatient with  follow-up  with Dr. Lequita Halt.      Jamelle Rushing, P.A.    ______________________________  Erasmo Leventhal, M.D.    RWK/MEDQ  D:  05/18/2009  T:  05/18/2009  Job:  [id number]   cc:   Erasmo Leventhal, M.D.  Fax: [phone]

## 2011-04-30 NOTE — Op Note (Signed)
[name], [name]              ACCOUNT NO.:  [id number]   MEDICAL RECORD NO.:  [id number]          PATIENT TYPE:  INP   LOCATION:  1525                         FACILITY:  [facility]   PHYSICIAN:  Marlowe Kays, M.D.  DATE OF BIRTH:  09/11/1925   DATE OF PROCEDURE:  06/14/2009  DATE OF DISCHARGE:                               OPERATIVE REPORT   PREOPERATIVE DIAGNOSIS:  Recurrent superior dislocation right total hip  replacement.   POSTOPERATIVE DIAGNOSIS:  Recurrent superior dislocation right total hip  replacement.   OPERATION:  Closed reduction of the dislocation of right total hip  replacement.   SURGEON:  Marlowe Kays, M.D.   ASSISTANTDruscilla Brownie. Cherlynn June.   ANESTHESIA:  General.   INDICATIONS FOR PROCEDURE:  Original surgery was done by Dr. Lequita Halt in  November 2009.  Three weeks ago he had his first dislocation with closed  reduction with Dr. Thomasena Edis, one of our partners and today he was bending  over at the waist and the hip dislocated one more time.  He is here  today consequently for the above-mentioned procedure.   PROCEDURE:  Under satisfactory general anesthesia, the C-arm utilized,  this proved somewhat difficult to reduce.  Basically I used a maneuver  of flexion and adduction, bringing the femoral head down to the  acetabulum and then with traction rotation was finally able to get it to  reduce.  This was confirmed by C-arm x-rays.  Placed in knee  immobilizer.  At the time of dictation he is on his way to recovery in  satisfactory condition with no known complication.           ______________________________  Marlowe Kays, M.D.     JA/MEDQ  D:  06/14/2009  T:  06/15/2009  Job:  [id number]

## 2011-04-30 NOTE — H&P (Signed)
NAMEAMIN, FORNWALT NO.:  [id number]   MEDICAL RECORD NO.:  [id number]          PATIENT TYPE:  INP   LOCATION:  1525                         FACILITY:  [facility]   PHYSICIAN:  Marlowe Kays, M.D.  DATE OF BIRTH:  02/22/1925   DATE OF ADMISSION:  06/14/2009  DATE OF DISCHARGE:                              HISTORY & PHYSICAL   PRESENT ILLNESS:  This is an 77 year old white male who was at home in  his garden today when he bent over to couple two hoses together.  He had  immediate instability to his right hip and fell in the garden.  He  suffered no other injuries.  The ambulance was called and brought him to  the [facility] Emergency Room where x-rays revealed a superior  dislocation of his right total hip replacement arthroplasty.  He tells  [place] that this hip was inserted by Ollen Gross, M.D. back in November of  2009.  About 3 weeks ago, he was bending to pick up a board and  dislocated his right hip at that time.  That was reduced and he was  doing fairly well according to his report until today.  He has a total  hip in the other side which has done well.   Examination reveals the right knee and hip to be flexed which he finds  to be a comfortable position.  Neurovascular is intact to his right  lower extremity and he can move his ankle well.   PAST MEDICAL HISTORY:  1. The patient has history of MI in the past with stents applied.  2. He has also had an appendectomy.   MEDICAL PROBLEMS:  Include:  1. BPH.  2. HTN.  3. Coronary artery disease with MI.  4. Reflux problems.  5. Sleep apnea.   HE IS ALLERGIC TO PREDNISONE.   CURRENT MEDICATIONS:  1. Aricept 10 mg daily.  2. Aspirin 81 mg daily.  3. Celebrex 200 mg daily.  4. Diovan/hydrochlorothiazide 160/12 daily.  5. Finasteride 5 mg daily.  6. Fish oil one tablet daily.  7. Metoprolol tartrate 50 mg b.i.d.  8. Plavix 75 mg daily.  9. Terazosin 5 mg daily.   FAMILY HISTORY:  Noncontributory.   SOCIAL HISTORY:  The patient is retired, married, no intake of alcohol  or tobacco products.   REVIEW OF SYSTEMS:  CNS:  No seizure disorder, paralysis, numbness or  double vision.  RESPIRATORY:  No productive cough, no hemoptysis or  shortness of breath.  Cardiovascular:  No chest pain, no angina or  orthopnea.  Gastrointestinal:  No nausea, vomiting, or melenic stool.  GENITOURINARY:  No discharge, dysuria or hematuria, but he does have  difficulty getting his stream started and he has frequency.  MUSCULOSKELETAL:  Primarily in present illness.   PHYSICAL EXAMINATION:  Alert, cooperative and friendly 77 year old white  male seen lying on emergency room stretcher accompanied by his wife.  VITAL SIGNS:  Blood pressure 129/64, pulse 47, respirations 16,  temperature 98.1.  HEENT:  Normocephalic, PERRLA.  Oropharynx is clear.  CHEST:  Clear to auscultation.  No rhonchi or  rales.  No wheezes with  the patient  auscultated supine.  HEART:  Regular rate and rhythm.  Heart sounds are a little distant.  No  murmurs are heard.  ABDOMEN:  Soft, nontender.  Bowel sounds present.  GENITALIA/RECTAL:  Not done not pertinent to present illness.  RIGHT LOWER EXTREMITY:  As in present illness above.   ADMITTING DIAGNOSES:  1. Recurrent dislocation, right total knee replacement arthroplasty.  2. Hypertension.  3. History of myocardial infarction.  4. Sleep apnea.  5. Hypercholesterolemia.  6. Coronary artery disease with history of myocardial infarction.   PLAN:  We will perform a closed reduction under general anesthesia as  soon as operating room schedule permits.      Dooley L. Cherlynn June.    ______________________________  Marlowe Kays, M.D.    DLU/MEDQ  D:  06/14/2009  T:  06/15/2009  Job:  [id number]

## 2011-04-30 NOTE — H&P (Signed)
[name], SLYTER NO.:  [id number]   MEDICAL RECORD NO.:  [id number]          PATIENT TYPE:  INP   LOCATION:                               FACILITY:  [facility]   PHYSICIAN:  Ollen Gross, M.D.    DATE OF BIRTH:  05-31-1925   DATE OF ADMISSION:  11/07/2008  DATE OF DISCHARGE:                              HISTORY & PHYSICAL   CHIEF COMPLAINT:  Right hip pain.   HISTORY OF PRESENT ILLNESS:  The patient is 77 year old gentleman with  several years' history of progressively worsening pain in his right hip  and difficulty with range of motion.  The patient has failed  conservative treatment.  The patient has elected to proceed with a total  hip arthroplasty. His x-ray shows he is bone on bone, femoral head and  acetabular.   ALLERGIES:  PREDNISONE.   PRIMARY CARE PHYSICIAN:  Dr. Illene Regulus.   UROLOGIST:  Dr. Marcelyn Bruins.   CARDIOLOGIST:  Dr. Patty Sermons.   CURRENT MEDICATIONS:  1. Diovan 80 mg a day.  2. Plavix 75 mg a day.  3. Toprol 100 mg b.i.d.  4. Hytrin 5 mg a day.  5. Aspirin 81 mg a day.  6. Foltx 400 mg once a day.  7. Crestor 5 mg a day.  8. Celebrex 200 mg a day.   PRESENT MEDICAL HISTORY:  1. Hypertension.  2. Coronary artery disease with a history of an MI and cardiac      stenting in 2001.  3. Occasional tachycardia secondary to stress and reflux.  Self treats      with an ice cold glass of water.  4. Sleep apnea.  5. Hypercholesterolemia.  6. History of BPH.  7. History of kidney stones.  8. History of left total hip arthroplasty in 1989.   REVIEW OF SYSTEMS:  Negative for any neurologic issues other than  reading glasses.  PULMONARY: He is recently tested for sleep apnea.  He  has, over the last 2 months, started using a CPAP machine.  CARDIOVASCULAR:  He occasionally has tachycardiac events.  He states it  is related to his reflux.  He drinks a large ice cold glass of water  that quiets down his heart rate.  Last stress test 10  years previous.  He does have one cardiac stent with a history of MI in 2001.  He denies  any recent chest pains.  GI:  Positive for occasional reflux.  GU:  He  does have a history of a kidney stone 10 years previous.  He does have  some BPH which is well controlled with his Hytrin.   PAST SURGICAL HISTORY:  Includes appendectomy, a hernia repair, left  total hip arthroplasty, sleep apnea surgery in 2002, and back surgery in  2003 without any anesthesia complications.   FAMILY MEDICAL HISTORY:  Father is deceased from a heart attack and  stroke issues at the age of 70.  Mother is deceased from Alzheimer's at  35.   SOCIAL HISTORY:  The patient is married.  He has never smoked.  No  alcohol.  He  has three grown children, lives with his wife who will help  provide care for him postop.   PHYSICAL EXAMINATION:  VITALS:  Height is 5 feet 8 inches, weight is 158  pounds, blood pressure was 154/68, pulse of 60.  Respirations 12.  The  patient was afebrile.  GENERAL:  This is a healthy-appearing gentleman, conscious, alert and  appropriate, easily gets on and off the exam table.  Appears to be in no  distress.  HEENT: Head was normocephalic.  Pupils equal, round and reactive.  Gross  hearing is intact.  NECK:  Neck was supple.  No palpable lymphadenopathy.  Good range of  motion.  CHEST:  Lung sounds were clear and equal bilaterally.  No wheezes,  rales, rhonchi.  HEART:  Regular rate and rhythm.  No murmurs, rubs or gallops.  ABDOMEN:  Soft, nontender.  Bowel sounds present.  EXTREMITIES:  Upper extremities had excellent range of motion of  shoulders, elbows, wrists.  Motor strength is 5/5.  LOWER EXTREMITIES:  Left hip had full extension.  He easily flexed up to  120.  He had 30 degrees internal-external rotation without any  discomfort.  His right hip he was able to fully extend. He is only able  to flex it up about 110. He had zero degrees of internal rotation, about  20 degrees of  external rotation with groin pain.  Both knees, he was  able to fully extend, flexed them back to 120, no instability.  Both  ankles had good motion.  Peripheral vascular carotid pulses were 2+ no  bruits.  Radial pulses 2+, dorsalis pedis pulses were 1+.  He had no  lower extremity edema.  NEURO:  The patient was conscious, alert and appropriate.  He is a good  conversationalist. He had no gross neurologic defects.  BREAST/RECTAL/GU:  Exams were deferred at this time.   IMPRESSION:  1. End-stage osteoarthritis, right hip, bone on bone.  2. Hypertension.  3. History of myocardial infarction in 2001 with cardiac stenting.  4. History of occasional tachycardia which he self treats with a glass      of ice cold water.  5. Sleep apnea on a continuous positive airway pressure machine.  6. Reflux disease.  7. Benign prostatic hypertrophy.  8. History of kidney stones.   PLAN:  The patient has been evaluated by Dr. Cassell Clement and has  been cleared for this upcoming surgical procedure.  The patient will  undergo all routine labs and tests prior to having a right total hip  arthroplasty by Dr. Lequita Halt at [facility] on November 07, 2008.      Jamelle Rushing, P.A.      Ollen Gross, M.D.  Electronically Signed    RWK/MEDQ  D:  10/21/2008  T:  10/21/2008  Job:  [id number]

## 2011-04-30 NOTE — Op Note (Signed)
NAMESHLOIME, KEILMAN NO.:  [id number]   MEDICAL RECORD NO.:  [id number]          PATIENT TYPE:  INP   LOCATION:  0003                         FACILITY:  [facility]   PHYSICIAN:  Ollen Gross, M.D.    DATE OF BIRTH:  03-26-1925   DATE OF PROCEDURE:  11/07/2008  DATE OF DISCHARGE:                               OPERATIVE REPORT   PREOPERATIVE DIAGNOSIS:  Osteoarthritis, right hip.   POSTOPERATIVE DIAGNOSIS:  Osteoarthritis, right hip.   PROCEDURE:  Right total hip arthroplasty.   SURGEON:  Ollen Gross, M.D.   ASSISTANT:  Alexzandrew L. Perkins, P.A.C.   ANESTHESIA:  General.   ESTIMATED BLOOD LOSS:  600.   DRAINS:  Hemovac x1.   COMPLICATIONS:  None.   CONDITION:  Stable to the recovery room.   BRIEF CLINICAL NOTE:  Mr. Rawdon is an 77 year old male with severe end-  stage arthritis of the right hip with a significantly dysplastic  acetabulum.  He has had intractable pain and presents with total hip  arthroplasty.   PROCEDURE IN DETAIL:  After successful administration of a general  anesthetic, the patient is placed in a left lateral decubitus position  with the right side up and held with a positioner.  The right lower  extremity is isolated from his perineum with plastic drapes.  Prepped  and draped in the usual sterile fashion.  A short posterolateral  incision is made through the skin, through subcutaneous tissue, to the  level of the fascia lata, which is increased in-line with the skin  incision.  The sciatic nerve is palpated and protected, and a short  external rotator is isolated off the femur.  The capsulectomy is  performed, and the hip is dislocated.  The center of the femoral head is  marked, and a trial prosthesis is placed such that the center of the  trial head corresponds to the center of his native femoral head.  An  osteotomy line is marked on the femoral neck, and an osteotomy made with  the oscillating saw.  The femoral head is  then removed, and the femur  retracted anteriorly to gain acetabular exposure.   Acetabular retractors are placed in the labrum, and osteophytes removed.  Acetabular reaming begins at 45 mm, coursing increments of 2 to a 55,  and a 56-mm Pinnacle acetabular shell is placed in anatomic position and  transfixed with 2 dome screws.  There is a little bit of uncovering  superiorly due to the dysplasia.  The trial 36-mm neutral +4 liner is  placed.   The femur is prepared with a canal finder and irrigation.  Axial reaming  is performed up to 13.5 mm proximally, reaming to an 18.5 and a sleeve  machine to a large.  The 18.5 large trial sleeve is placed with an 18 x  13 stem and a 36+8 neck, 10 degrees beyond native anteversion.  The  trial 36 posterior head is placed, and the hip is reduced with  outstanding stability.  There is full extension and full external  rotation, 70 degrees flexion, 40 degrees abduction, and 90 degrees  internal rotation, and 90 degrees of flexion and 70 degrees of internal  rotation.  By placing the right leg on top of the left, it is felt as  though the leg lengths are equal.  The hip is then dislocated, and all  trials are removed.  The permanent apex hole eliminator is placed into  the acetabular shell, then a permanent 32-mm neutral +4 Marathon liner  is placed.  On the femoral side, the 70F large sleeve is placed with an  18 x 13 stem and a 36+8 neck, again 10 degrees beyond native  anteversion.  For the trial, we used a 36+3, so we used a 36+3 head for  the permanent.  The hip is reduced at the same stability parameters.  It  was copiously irrigated with saline solution.  Short rotators were  reattached to the femur through the drill holes.  The fascia lata is  closed over a Hemovac drain with interrupted #1 Vicryl.  The subcu  closed with #1 and 2-0 Vicryl, and subcuticular with running 4-0  Monocryl.  The drains are hooked to suction.  The incision is  cleaned  and dried.  Steri-Strips and a bulky sterile dressing are applied.  He  is then placed into a knee immobilizer, awakened and transported to  recovery in stable condition.      Ollen Gross, M.D.  Electronically Signed     FA/MEDQ  D:  11/07/2008  T:  11/07/2008  Job:  [id number]

## 2011-05-01 ENCOUNTER — Inpatient Hospital Stay (HOSPITAL_COMMUNITY)
Admission: RE | Admit: 2011-05-01 | Discharge: 2011-05-03 | DRG: 468 | Disposition: A | Discharge status: Home Health | Payer: Medicare Other | Source: Ambulatory Visit | Attending: Orthopedic Surgery | Admitting: Orthopedic Surgery

## 2011-05-01 ENCOUNTER — Inpatient Hospital Stay (HOSPITAL_COMMUNITY): Payer: Medicare Other

## 2011-05-01 DIAGNOSIS — Z7902 Long term (current) use of antithrombotics/antiplatelets: Secondary | ICD-10-CM

## 2011-05-01 DIAGNOSIS — Z96649 Presence of unspecified artificial hip joint: Secondary | ICD-10-CM

## 2011-05-01 DIAGNOSIS — Z7982 Long term (current) use of aspirin: Secondary | ICD-10-CM

## 2011-05-01 DIAGNOSIS — I10 Essential (primary) hypertension: Secondary | ICD-10-CM | POA: Diagnosis present

## 2011-05-01 DIAGNOSIS — Z9861 Coronary angioplasty status: Secondary | ICD-10-CM

## 2011-05-01 DIAGNOSIS — G4730 Sleep apnea, unspecified: Secondary | ICD-10-CM | POA: Diagnosis present

## 2011-05-01 DIAGNOSIS — IMO0002 Reserved for concepts with insufficient information to code with codable children: Secondary | ICD-10-CM | POA: Diagnosis present

## 2011-05-01 DIAGNOSIS — N400 Enlarged prostate without lower urinary tract symptoms: Secondary | ICD-10-CM | POA: Diagnosis present

## 2011-05-01 DIAGNOSIS — T84069A Wear of articular bearing surface of unspecified internal prosthetic joint, initial encounter: Principal | ICD-10-CM | POA: Diagnosis present

## 2011-05-01 DIAGNOSIS — Z79899 Other long term (current) drug therapy: Secondary | ICD-10-CM

## 2011-05-01 DIAGNOSIS — I2510 Atherosclerotic heart disease of native coronary artery without angina pectoris: Secondary | ICD-10-CM | POA: Diagnosis present

## 2011-05-01 DIAGNOSIS — Y831 Surgical operation with implant of artificial internal device as the cause of abnormal reaction of the patient, or of later complication, without mention of misadventure at the time of the procedure: Secondary | ICD-10-CM | POA: Diagnosis present

## 2011-05-01 DIAGNOSIS — I252 Old myocardial infarction: Secondary | ICD-10-CM

## 2011-05-01 LAB — TYPE AND SCREEN
ABO/RH(D): O POS
Antibody Screen: NEGATIVE

## 2011-05-02 LAB — BASIC METABOLIC PANEL
BUN: 17 mg/dL (ref 6–23)
Calcium: 8.1 mg/dL — ABNORMAL LOW (ref 8.4–10.5)
Creatinine, Ser: 1.28 mg/dL (ref 0.4–1.5)
GFR calc non Af Amer: 54 mL/min — ABNORMAL LOW (ref >60)
Glucose, Bld: 131 mg/dL — ABNORMAL HIGH (ref 70–99)

## 2011-05-02 LAB — CBC
HCT: 33.4 % — ABNORMAL LOW (ref 39.0–52.0)
MCH: 29.5 pg (ref 26.0–34.0)
MCHC: 32.9 g/dL (ref 30.0–36.0)
MCV: 89.5 fL (ref 78.0–100.0)
Platelets: 126 10*3/uL — ABNORMAL LOW (ref 150–400)
RDW: 13.1 % (ref 11.5–15.5)

## 2011-05-02 NOTE — H&P (Signed)
[name], OZBURN NO.:  [id number]  MEDICAL RECORD NO.:  [id number]           PATIENT TYPE:  I  LOCATION:  0007                         FACILITY:  [facility]  PHYSICIAN:  Rozell Searing, [facility]    DATE OF BIRTH:  09-24-1925  DATE OF ADMISSION:  05/01/2011 DATE OF DISCHARGE:                             HISTORY & PHYSICAL   PRIMARY CARE PHYSICIAN:  Windle Guard, M.D.  PHYSICIAN:  Ollen Gross, M.D.  UROLOGIST:  Jamison Neighbor, M.D.  CARDIOLOGIST:  Cassell Clement, M.D.  CHIEF COMPLAINT:  Left hip pain.  BRIEF HISTORY:  Mr. Potenza have been seen by Dr. Lequita Halt for both hips. Dr. Lequita Halt performed right total hip arthroplasty.  Mr. Jaggers already had a left total hip arthroplasty over 20 years ago.  He came in to see Dr. Lequita Halt in February for worsening pain with weightbearing in the left hip.  He was found to have significant poly-wear in the left hip. He now presents for left hip prosthetic joint implant failure.  MEDICATION ALLERGIES:  PREDNISONE causes increased heart rate.  He has been cleared for surgery by Dr. Patty Sermons.  CURRENT MEDICATIONS: 1. Diovan. 2. Plavix, which he will stop 5 days before surgery. 3. Toprol. 4. Hytrin. 5. Aspirin. 6. Proscar. 7. Crestor. 8. Celebrex. 9. Vitamin D. 10.Vitamin C. 11.Vitamin E.  PAST MEDICAL HISTORY: 1. Sleep apnea.  Patient uses CPAP machine and he will bring the mask. 2. Myocardial infarction in October 2002. 3. History of heart stenting x1. 4. Degenerative disk disease.  PAST SURGICAL HISTORY: 1. Appendectomy. 2. Hernia repair. 3. Left total hip arthroplasty. 4. Sleep apnea surgery. 5. Back surgery. 6. Right total hip arthroplasty.  Patient says he has no complications with anesthesia.  FAMILY HISTORY:  Father passed at the age of 41 of CVA.  Mother passed at the age of 17.  She had Alzheimer's.  SOCIAL HISTORY:  The patient is married.  He denies past or present use of alcohol or  tobacco products.  He lives at home with his wife.  He does plan to go home following his hospital stay.  REVIEW OF SYSTEMS:  GENERAL:  Negative for fevers, chills or weight change. HEENT:  Positive for hearing loss.  Negative for headache or insomnia. DERMATOLOGIC:  Negative for rash or lesion. RESPIRATORY:  Negative for shortness of breath at rest or on exertion. Last chest x-ray was in 2011. CARDIOVASCULAR:  Negative for chest pain or palpitations.  EKG and stress test performed recently by Dr. Patty Sermons. GI:  Negative for nausea, vomiting, diarrhea. GU:  Negative for hematuria, dysuria. MUSCULOSKELETAL:  Positive for joint pain.  PHYSICAL EXAMINATION:  VITAL SIGNS:  Pulse 54, respirations 18, blood pressure 138/72 in the left arm. GENERAL:  Mr. [name] is alert and oriented x3.  He is a pleasant 77- year-old male.  He has a stated height of 5 feet 7 inches and stated weight of 165 pounds, well-developed, well-nourished, in no apparent distress. HEENT:  Normocephalic, atraumatic.  Extraocular movements intact. NECK:  Supple.  Full range of motion without lymphadenopathy. CHEST:  Lungs are clear to auscultation bilaterally without wheezes, rhonchi  or rales. HEART:  Regular rate and rhythm without murmur. ABDOMEN:  Bowel sounds present in all four quadrants.  Abdomen is soft, nontender to palpation. EXTREMITIES:  Left hip able to be flexed to 100 degrees, 20 degrees of internal rotation, 30 degrees of external rotation and he is able to abduct to 30 degrees. SKIN:  Unremarkable. NEUROLOGIC:  Intact. PERIPHERAL VASCULAR:  Carotid pulses 2+ bilaterally without bruits.  RADIOGRAPHS:  AP and lateral views of the left hip revealed eccentric polyethylene wear with ostial lysis of proximal femur.  IMPRESSION:  Failed left total hip arthroplasty.  PLAN:  Left total hip arthroplasty revision.     Rozell Searing, [facility]     LD/MEDQ  D:  05/01/2011  T:  05/01/2011  Job:   [id number]  Electronically Signed by Rozell Searing  on 05/02/2011 10:43:56 AM

## 2011-05-03 LAB — CBC
HCT: 34.5 % — ABNORMAL LOW (ref 39.0–52.0)
MCHC: 33.3 g/dL (ref 30.0–36.0)
Platelets: 115 10*3/uL — ABNORMAL LOW (ref 150–400)
RDW: 13.2 % (ref 11.5–15.5)
WBC: 9.8 10*3/uL (ref 4.0–10.5)

## 2011-05-03 LAB — PROTIME-INR
INR: 1.41 (ref 0.00–1.49)
Prothrombin Time: 17.5 seconds — ABNORMAL HIGH (ref 11.6–15.2)

## 2011-05-03 LAB — BASIC METABOLIC PANEL
BUN: 17 mg/dL (ref 6–23)
Calcium: 8 mg/dL — ABNORMAL LOW (ref 8.4–10.5)
Chloride: 104 mEq/L (ref 96–112)
Creatinine, Ser: 1.41 mg/dL (ref 0.4–1.5)
GFR calc Af Amer: 58 mL/min — ABNORMAL LOW (ref >60)

## 2011-05-03 NOTE — Discharge Summary (Signed)
[place]. [facility]  Patient:    [name], [name] Visit Number: [id number] MRN: [id number]          Service Type: MED Location: [facility] [id number] Attending Physician:  Darlin Priestly Dictated by:   Raymon Mutton, P.A. Admit Date:  10/11/2001 Discharge Date: 10/17/2001                             Discharge Summary  DATE OF BIRTH:  01/29/1925.  DISCHARGE DIAGNOSES: 1. Coronary artery disease, status post acute myocardial infarction of    anterior wall, treated with emergency percutaneous coronary intervention    to left anterior descending coronary artery. 2. Left ventricular function with ejection fraction 35 to 40%. 3. Hypertension. 4. Episode of supraventricular tachycardia resolved with increased dose of    beta-blockers. 5. Status post left hip replacement. 6. Status post laminectomy. 7. Benign prostatic hypertrophy. 8. Gastroesophageal reflux disease/hiatal hernia. 9. Sleep apnea.  HISTORY OF PRESENT ILLNESS:  A 77 year old Caucasian man presented to the emergency room with chest pain with left and right shoulder radiation and upper epigastric pain.  He denied diaphoresis and had some shortness of breath and also severe nausea and vomiting.  He has been experiencing these symptoms of less degree for four to five days but did not report them and he has no prior history of coronary artery disease.  EKG in the emergency room showed elevated ST and Q-wave in the anterolateral leads and cardiac enzymes showed CPK 175, CK-MB 6.7 and troponin 0.13 first set.  The second set CPK was 714 and CK-MB 574.  The third set showed _____________ to 4426 of CPK and 327 CK-MB with 175.84 troponin.  The enzymes were monitored for a few days and on November 2001 showed 75 CPK but CK-MB came back to normal 2.9.  At the emergency room the patient was given IV heparin and IV nitroglycerin and was taken to the lab for the risk of PTCI.  Cardiac  catheterization performed by Lenise Herald, M.D., on October 11, 2001.  The procedure revealed lesion in the proximal mid LAD and he underwent AngioJet anxiolytics atherectomy with adjunct percutaneous transluminal coronary balloon angioplasty and placement of VAX velocity 3.0 x 28 mm stent and the patient tolerated that well and it showed the final angiogram revealed less than 10% of residual stenosis in the proximal to mid LAD with TIMI-2.5 to 3 flow in the distal LAD.  He also was found to have a depressed left ventricular systolic function with wall motion abnormalities ad ejection fraction was estimated at 35 to 40%.  The patient was transferred to telemetry in stable condition and was given bolus and drip of Integrelin and continued to remained stable throughout admission.  On October 14, 2001, he developed supraventricular tachycardia with rate of 150 to 140 and the dose of his Toprol was elevated and the patient came back to normal sinus rhythm with heart rate 70 to 80 beats per minute.  He was discharged on October 17, 2001, after his blood pressure and heart rate stabilized and 72 hours passed since that acute episode of MI and procedure.  DISCHARGE MEDICATIONS: 1. Toprol XL 50 mg q.d. 2. Lipitor 20 mg q.d. 3. Enteric coated aspirin 325 mg q.d. 4. Plavix 75 mg q.d. 5. Flomax 0.4 mg q.d. 6. Lanoxin 0.125 mg q.d. 7. Nitroglycerin p.r.n. 8. Altace 2.5 mg q.d. for a week and then increase to  5 mg q.d.  DISCHARGE INSTRUCTIONS: No strenuous physical activity.  No driving for two to three days after discharge.  DISCHARGE DIET:  Low salt, low cholesterol, low fat diet.  WOUND CARE:  The patient was allowed to take a shower.  He was instructed to wash groin __________ with mild soap and avoid rubbing that dry.  He was also instructed instructed to report any problems such as oozing or bleeding, swelling or increased pain or redness in the area area.  FOLLOW-UP:  He will be seen  in follow-up by Dr. Jenne Campus in two weeks. Since discharge was performed on the weekend, the patient was provided with the phone number in the office and was advised to make a phone call to schedule a follow-up appointment with Dr. Jenne Campus. Dictated by:   Raymon Mutton, P.A. Attending Physician:  Darlin Priestly DD:  10/26/01 TD:  10/27/01 Job: [id number]

## 2011-05-03 NOTE — Discharge Summary (Signed)
NAMEMINARD, MILLIRONS              ACCOUNT NO.:  [id number]   MEDICAL RECORD NO.:  [id number]          PATIENT TYPE:  INP   LOCATION:  1606                         [facility]ACILITY:  [facility]   PHYSICIAN:  Ollen Gross, M.D.    DATE OF BIRTH:  1925-01-24   DATE OF ADMISSION:  11/07/2008  DATE OF DISCHARGE:  11/10/2008                               DISCHARGE SUMMARY   ADMITTING DIAGNOSES:  1. End-stage arthritis right hip bone-on-bone.  2. Hypertension.  3. History of myocardial infarction 2001 with cardiac stenting.  4. History of occasional tachycardia.  5. Sleep apnea with CPAP.  6. Reflux disease.  7. Benign prostatic hypertrophy.  8. History of renal calculi.   DISCHARGE DIAGNOSES:  1. Osteoarthritis right hip status post right total hip replacement      arthroplasty.  2. Mild postop blood loss anemia with transfusion.  3. Hypertension.  4. History of myocardial infarction 2001 with cardiac stenting.  5. History of occasional tachycardia.  6. Sleep apnea with CPAP.  7. Reflux disease.  8. Benign prostatic hypertrophy.  9. History of renal calculi.   PROCEDURE:  November 04, 2008 right total hip.  Surgeon, Dr. Lequita Halt.  Assistant, Avel Peace PA-C.  Anesthesia was spinal.   BRIEF HISTORY:  Mr. Laski is an 77 year old male with severe  osteoarthritis of the right hip with significant dysplastic acetabulum,  intractable pain now presents for total hip arthroplasty.   LABORATORY DATA:  Previous CBC showed hemoglobin 12, hematocrit 39.4  white cell count 7.6, platelets 161,000.  Postop hemoglobin down to 9.5  given 2 units blood back up to 10, drifted down to 8.7.  Last H and H  9.0 and 26.4.  PT/PTT preop 13.6, 26 respectively with INR of 1.0.  Serial pro time followed.  PT/INR 19.9 and 1.6 Chem panel on admission  all within normal limits.  Serial BMET followed.  Electrolytes remained  within normal limits.  Preop UA negative.  Blood group type O positive.   EKG October 27, 2008 sinus bradycardia, septal infarct, T-wave  abnormality consider lateral ischemia worsened since last tracing.  ST-T  wave changes are less pronounced, confirmed with Dr. Diona Browner.   Chest x-ray October 27, 2008 no acute cardiopulmonary disease noted.  Right hip film October 27, 2008 severe osteoarthritis right hip.  Lucency present around the proximal aspect of left arthroplasty femoral  stem.  Pelvis and right hip films November 07, 2008, expected  postoperative appearance of right total hip arthroplasty.   HOSPITAL COURSE:  The patient was admitted to [facility] taken  to OR, underwent above-stated procedure without complication.  The  patient tolerated procedure well.  He was noted to have some low  pressures postoperatively and significant amount of output from his  Hemovac that did require some fluid boluses to help with pressure in  recovery room.  By the next morning his pressure had improved and he was  doing fairly well without too much pain, tolerated the PCA.  Weaned over  to p.o. meds.  We started back on his blood pressure meds put on  parameters since  his blood pressure was a little on the soft side and a  history of tachycardia but his rate was controlled around 60.  He had  decent urinary output.  Had good response to fluids.  Actually got up  walking 50 feet on day 1.  By day 2, pressure stabilized.  Hemoglobin  was down to 8.7 and dressing change incision looked good.  Monitoring  his pressure.  He did receive 1 unit of blood on the evening of surgery,  early morning hours his hemoglobin dropped back down to 8.7.  By day 2.  We rechecked and by day 3, came back up to 9 and was stable.  Electrolytes were doing well and remained stable.  He progressed with  physical therapy and by November 10, 2008 he was meeting his goals,  tolerating his medicine and was discharged home.   DISCHARGE/PLAN:  1. The patient discharged home November 10, 2008.  2.  Discharge diagnoses, please see above.  3. Discharge meds Percocet, Robaxin, Coumadin and Nu-Iron.   ACTIVITY:  Partial weightbearing hip precautions.  Home health PT, home  health nursing follow up 2 weeks.   DISPOSITION:  Home.   CONDITION ON DISCHARGE:  Improved.      Alexzandrew L. Perkins, P.A.C.      Ollen Gross, M.D.  Electronically Signed    ALP/MEDQ  D:  12/14/2008  T:  12/14/2008  Job:  [id number]   cc:   Cassell Clement, M.D.  Fax: [phone]   Rosalyn Gess. Norins, MD  [address]  [place]  [place] [address]

## 2011-05-03 NOTE — H&P (Signed)
[place]. [facility]  Patient:    [name], [name]. Visit Number: [id number] MRN: [id number]          Service Type: Attending:  Winn Jock. Smitty Cords, M.D. Dictated by:   Winn Jock Smitty Cords, M.D. Adm. Date:  10/11/01   CC:         Lenise Herald, M.D.                         History and Physical  CHIEF COMPLAINT:  Chest pain about three or four hours duration.  PRESENT ILLNESS:  This is a 77 year old retired [employer] who was admitted because of chest pain described as epigastric and midsternal with left and right shoulder radiation, no arm radiation, no diaphoresis.  However, it was associated with rather severe nausea and vomiting and pain becoming increasingly severe.  In retrospect he had been having symptoms of less degree for four or five days but had not reported it.  He had no past history of coronary disease but has had moderate essential hypertension which has been under good control.  Likewise, he had been treated for reflux esophageal hernia and had also been treated for apnea with pulmonary involvement.  MEDICATIONS: 1. Cozaar 50 mg. 2. HCT 25. 3. Toprol 25. 4. Hytrin 5. 5. Celebrex one daily.  PRIOR SURGERIES:  Disk operation for degenerative disk disease, left hip replacement, nasopharyngeal procedure by Dr. Jearld Fenton, appendectomy.  FAMILY HISTORY:  Father died of stroke.  No family history of diabetes.  No coronary disease.  SOCIAL HISTORY:  The patient is married with three children living and well.  ALLERGIES:  Currently has reaction to PREDNISONE manifested by tachycardia.  REVIEW OF SYSTEMS:  EENT: Negative no history of syncope, headache or dizziness.  Respiratory: Has had symptoms related to sleep apnea and presently has shortness of breath, frequent cough and frequent bronchitis like illness. No history of tobacco or other inhalation irritants.  Cardiovascular: No prior history of angina.  Prior EKGs have revealed mild left  ventricular hypertrophy by U-wave only.  Gastrointestinal: Has had frequent symptoms of indigestion, gas and history of reflux which had been on occasional Prilosec.  In addition, he has had two colonoscopies for a benign polyp.  No history of blood, vomiting or black stools.  Genitourinary: There is occasional nocturia, currently on Hytrin.  Musculoskeletal: Significant degenerative arthritis involving the lower spine and right hip.  He had a left hip replacement. Endocrine: There is no history of diabetes and his weight is stable. Neuropsychiatric: Negative.  PHYSICAL EXAMINATION:  GENERAL:  This is a well-developed white male in some moderate distress. VITAL SIGNS:  Blood pressure 150/86, pulse 60 and regular.  Temperature 98.6. Weight is 160.  HEENT:  No neck bruits.  No jugular venous distension.  LUNGS:  Clear.  HEART:  Somewhat slow but no gallop or murmur.  ABDOMEN:  Soft and nontender.  No masses.  GENITALIA:  Normal.  RECTAL:  Examination reveals prostate firm and large x 2.  Stool negative guaiac.  SKIN:  Slightly dry with no jaundice.  NEUROLOGIC:  Negative.  EKG in the emergency room revealed acute ST elevation involving a Q-wave in aVL suggestive of an anterolateral myocardial infarction.  Enzymes are pending.  IMPRESSION:  Acute myocardial infarction suspected.  Dr. Lenise Herald to see the patient.  OTHER DIAGNOSES: 1. History of hiatal hernia with reflux. 2. Sleep apnea with secondary pulmonary involvement. 3. Status post left hip replacement.  4. Status post laminectomy.  PRESENT PLAN:  Proceed probably with cardiac catheterization. Dictated by:   Winn Jock Smitty Cords, M.D. Attending:  Winn Jock. Smitty Cords, M.D. DD:  10/11/01 TD:  10/12/01 Job: [id number]

## 2011-05-03 NOTE — Discharge Summary (Signed)
[place]. [facility]  Patient:    [name], [name] Visit Number: [id number] MRN: [id number]          Service Type: MED Location: [id number] Attending Physician:  Rudean Hitt Dictated by:   Alvia Grove., M.D. Admit Date:  02/12/2002 Discharge Date: 02/16/2002   CC:         Fayrene Fearing C. Smitty Cords, M.D.  Thomas A. Patty Sermons, M.D.   Discharge Summary  DISCHARGE DIAGNOSES: 1. Supraventricular tachycardia. 2. Small subendocardial myocardial infarction. 3. Status post percutaneous transluminal coronary angioplasty with    re-expansion of his LAD stent. 4. Significant left ventricular systolic dysfunction secondary to    previous anterior wall myocardial infarction.  DISCHARGE MEDICATIONS: 1. Plavix 75 mg q.d. 2. Enteric-coated aspirin 325 mg q.d. 3. Lipitor 20 mg q.d. 4. Toprol XL 50 mg q.d. 5. Diovan 80 mg q.d. 6. Flomax 0.4 mg q.d. 7. Nitroglycerin 0.4 mg sublingually as needed. 8. Propranolol 10 mg four times a day as needed.  DISPOSITION:  The patient will see Dr. Patty Sermons in 1-2 weeks.  He is to call if he has any further episodes of pain or problems.  HISTORY:  [name] is a 77 year old gentleman with a history of supraventricular tachycardia.  He was admitted with SVT and some episodes of chest pain.  Please see the dictated H&P for further details.  HOSPITAL COURSE: #1 - CHEST PAIN/ABNORMAL EKG:  The patient had ST segment depression during his episode of SVT.  Subsequent cardiac enzymes revealed a small subendocardial myocardial infarction.  On February 15, 2002, he underwent heart catheterization which revealed a 70 percent stenosis within the stent.  He underwent successful PTCA with cutting balloon of the in-stent restenosis. The residual stenosis after the procedure was about 5-10 percent.  The patient tolerated the procedure quite well.  His EKG was unremarkable following the procedure.  #2 - SUPRAVENTRICULAR  TACHYCARDIA:  The patients Toprol was increased from 50 mg up to 100 mg q.d.  He did well and had no further episodes of SVT.  On the morning of discharge, his heart rate was noted to be 45.  The Toprol was decreased back down to 50 mg q.d. and he was given some Inderal tablets to take as needed.  His eventual home dose might be somewhere near 75 mg of Toprol q.d.  We will let Dr. Patty Sermons adjust that.  #3 - HYPERCHOLESTEROLEMIA:  Stable. Dictated by:   Alvia Grove., M.D. Attending Physician:  Rudean Hitt DD:  02/16/02 TD:  02/16/02 Job: [id number]

## 2011-05-03 NOTE — Op Note (Signed)
[name], [name]              ACCOUNT NO.:  [id number]   MEDICAL RECORD NO.:  [id number]          PATIENT TYPE:  OIB   LOCATION:  2899                         FACILITY:  [facility]   PHYSICIAN:  Hilda Lias, M.D.   DATE OF BIRTH:  06/26/1925   DATE OF PROCEDURE:  03/20/2005  DATE OF DISCHARGE:                                 OPERATIVE REPORT   PREOPERATIVE DIAGNOSIS:  Bilateral carpal tunnel syndrome, right worse than  the left one.   POSTOPERATIVE DIAGNOSIS:  Bilateral carpal tunnel syndrome, right worse than  the left one.   PROCEDURE:  Decompression of the right median nerve.   SURGEON:  Hilda Lias, M.D.   ANESTHESIA:  Local plus IV sedation.   CLINICAL HISTORY:  The patient has been complaining of bilateral hand pain  with weakness and numbness.  The nerve conduction velocity was positive for  carpal tunnel syndrome.  The right hand is worse than the left one.  He  wanted to have decompression of the right median nerve.  The surgery was  explained to him, including the possibilities of infection, no improvement  whatsoever, and need for further surgery.   PROCEDURE:  The patient was taken to the O.R.  Then, the right hand was  prepped with Betadine.  Infiltration with Xylocaine was made along the base  of the thumb.  Incision following the base of the thumb was made through the  skin, volar ligament, and through a thick, calcified carpal ligament.  Decompression medial and distal was made along the ulnar aspect of the  nerve.  At the end of the procedure, we had plenty of decompression and  plenty of room for the median nerve.  The nerve was found to be flat and  yellowish.  At the end, he was able to move the fingers including the right  thumb.  From then on, the area was irrigated.  Hemostasis was done with  bipolar.  The wound was closed with nylon.  The patient is going to go home,  to be followed by me in my office tomorrow.      EB/MEDQ  D:  03/20/2005  T:   03/20/2005  Job:  [id number]

## 2011-05-03 NOTE — Discharge Summary (Signed)
[place]  Patient:    [name], [name] Visit Number: [id number] MRN: [id number]          Service Type: MED Location: [id number] Attending Physician:  Rudean Hitt Dictated by:   Jennet Maduro Earl Gala, R.N., A.N.P. Admit Date:  05/07/2002 Discharge Date: 05/08/2002   CC:         Thomas A. Patty Sermons, M.D.   Discharge Summary  DISCHARGE DIAGNOSES: 1. Chest pain in the setting of known coronary atherosclerosis and previous    percutaneous coronary intervention; with subsequent elective cardiac    catheterization by Alvia Grove., M.D. showing no restenosis of the    previously placed stent in the left anterior descending. 2. Known left ventricular dysfunction. 3. Longstanding supraventricular tachycardia. 4. Recent hematuria/prostatitis. 5. Hypertension. 6. Previous acute anterior septal myocardial infarction in October of 2002. 7. Hypercholesterolemia.  HISTORY OF PRESENT ILLNESS:  The patient is a 77 year old white male who has a history of anterior septal myocardial infarction in October of 2002.  At that time that was treated with percutaneous coronary intervention involving a 3.0 x 28 mm BX Velocity stent originally placed by Lenise Herald, M.D.  He was admitted in March of 2003 with recurrent episode of supraventricular tachycardia and had abnormal enzymes.  Repeat cardiac catheterization at that time revealed in-stent restenosis which was treated with cutting balloon by Alvia Grove., M.D.  He presents to the emergency department on the early morning hours of May 07, 2002, with lower abdominal and epigastric discomfort that was similar to his previous chest pain syndrome.  He took nitroglycerin x3 as well as propanolol and became nauseated.  He subsequently became frightened and was referred to the emergency room.  His pain did not move into the chest area.  When he arrived to the emergency room, he  was treated with IV nitroglycerin and heparin and was subsequently pain-free when seen and evaluated by W. Ashley Royalty., M.D.  Please see the history and physical per W. Ashley Royalty., M.D. for further patient presentation and profile.  ADMISSION LABORATORY DATA:  His EKG shows a previous anterior septal MI with ST elevation in lead V2 to V4 and it was unchanged from prior tracing.  CBC showed a hemoglobin was 12.5, hematocrit 36.7, white count 5.9, platelets 193.  Chemistries were all within normal limits.  Cardiac enzymes were negative x3.  Troponin remained slightly elevated at 0.06 and 0.08.  HOSPITAL COURSE:  The patient was admitted.  He was placed on IV heparin and IV nitroglycerin.  His aspirin and Plavix were continued along with home medications.  Repeat cardiac catheterization was subsequently carried out by Alvia Grove., M.D. on May 07, 2002.  This revealed no restenosis of the left anterior descending stent that had previously been placed as well as the recent cutting balloon procedure as well.  His ejection fraction was noted to be 25%.  The LV is dilated.  Please see the dictated report for further details.  On May 08, 2002, he is doing well without complaints.  He is up and ambulatory within the room and he is felt to be a stable candidate for discharge with further evaluation to occur on an outpatient basis.  CONDITION ON DISCHARGE:  Stable.  DISCHARGE MEDICATIONS:  He will resume his home medicines.  We will add Nexium 40 mg a day.  ACTIVITY:  Progressive.  FOLLOW-UP:  Follow up with Maisie Fus A. Patty Sermons, M.D. in  approximately 10 days. He is asked to call the office to schedule that appointment. Dictated by:   Jennet Maduro Earl Gala, R.N., A.N.P. Attending Physician:  Rudean Hitt DD:  05/08/02 TD:  05/11/02 Job: [id number]

## 2011-05-03 NOTE — Cardiovascular Report (Signed)
[name]. [facility]  Patient:    UMBERTO, [name] Visit Number: [id number] MRN: [id number]          Service Type: MED Location: [facility] 2930 [id number] Attending Physician:  Darlin Priestly Dictated by:   Lenise Herald, M.D. Proc. Date: 10/11/01 Admit Date:  10/11/2001   CC:         [name], M.D.   Cardiac Catheterization  PROCEDURES: 1. Left heart catheterization. 2. Coronary angiography. 3. Left ventriculogram. 4. Left anterior descending artery--proximal    - AngioJet rheolytic    - Percutaneous transluminal coronary balloon angioplasty    - Placement of intracoronary stent  ATTENDING:  Lenise Herald, M.D.  COMPLICATIONS:  None.  INDICATIONS:  [name] is a 77 year old white male patient of Dr. Smitty Randolph with a history of hypertension and BPH who was admitted on October 11, 2001, with acute anterior wall MI.  The patient had stuttering chest pain for five days; however, he presented with two hours of sudden onset of substernal chest pain which began approximately 7:30 in the evening on October 27.  He is now brought to the cardiac catheterization lab for emergent PTCA.  DESCRIPTION OF PROCEDURE:  After giving informed written consent, the patient was brought to the cardiac catheterization lab where his right and left groin were shaved, prepped, and draped in the usual sterile fashion.  ECG monitoring was established.  Using a modified Seldinger technique, a 6-French venous sheath was inserted in the right femoral vein and an 8-French arterial sheath inserted in the right femoral artery.  The 6-French diagnostic catheters were then used to perform diagnostic angiography.  This reveals a large RCA which is noted to be dominant that gives rise to a PDA as well as a posterolateral branch.  The RCA is noted to have a shepherds crook in its proximal portion with about a 50% stenosis.  There is no significant disease in the remainder of the RCA.   The PDA and posterolateral branches are large vessels with no significant disease.  Following the right coronary angiography, an 8-French JL4 guiding catheter was then coaxially engaged in the left coronary ostium and selective angiogram was then performed.  This reveals a large left main with no significant disease. The LAD was a large vessel which was noted to be totally occluded in its proximal portion.  It did give rise to one small diagonal and one septal perforator before its occlusion.  The circumflex is a large vessel which coursed in the AV groove and gave rise to one large obtuse marginal branch and a small AV nodal branch.  The AV groove circumflex had no significant disease. The first OM is a large vessel which bifurcates in its mid segment and has no significant disease.  A left ventriculogram reveals a mildly depressed EF with 35-40% with anterior anterolateral and apical akinesis.  HEMODYNAMICS:  Systemic arterial pressure 143/78.  LV systemic pressure 130/12.  LVEDP of 26.  INTERVENTIONAL PROCEDURE:  Following diagnostic angiography, a 0.014 exchange length Patriot guidewire was advanced out of the guiding catheter into the proximal LAD.  The guidewire was then easily passed across the total occlusion and positioned in the distal LAD without difficulty.  Prior to the procedure, the patient had consented for possible enrollment in the acute MI trial using the AngioJet rheolytic catheter.  At this point, he was unblinded and he was chosen to receive the AngioJet catheter.  A 4-French AngioJet catheter was then easily passed into  the mid LAD.  Three subsequent pullbacks were then performed throughout the mid and proximal LAD.  Follow-up angiogram revealed no evidence of dissection or thrombus with TIMI-2 to -3 flow in the distal LAD.  This catheter was then removed.  There was a residual 90% lesion in the proximal LAD.  The AngioJet catheter was then removed and a Maverick  3.0- x 20-mm balloon was then tracked over the LAD guidewire and positioned in the proximal LAD.  Multiple inflations were then performed throughout the mid and proximal LAD up to eight atmospheres for a total of two minutes.  Follow-up angiogram revealed TIMI-2 flow into the distal LAD with persistent irregularities.  This balloon was then removed and a BX Velocity 3.0- x 28-mm hepacoat stent was then positioned in the proximal LAD with careful attention to cover both the proximal and distal ends of disease.  We also confirmed the stent was not encroaching on the left main.  The stent was then deployed to a maximum of 18 atmospheres for a total of one minute and five seconds. Follow-up angiogram revealed no evidence of dissection or thrombus; however, there was noted to be TIMI-2 flow into the distal LAD.  Multiple doses of intracoronary nitroglycerin, intracoronary adenosine, and intracoronary verapamil were given and the patient did ultimately develop TIMI-2.5 to 3 flow in the distal LAD.  His ST segments had returned to baseline.  The patient continued to have some mild chest soreness; however, his chest pain had almost completely resolved.  IV Integrilin was used.  IV doses of heparin were given to maintain ACT within 200-300.  Final orthogonal angiograms revealed less than 10% residual stenosis in the proximal and mid LAD stenotic lesion with TIMI-2.5 to 3 flow in the distal LAD.  At this point, we elected to conclude the procedure.  All balloons, wires, and catheters were removed.  Transvenous pacing wire was then removed. The hemostatic sheath was sewn in place.  The patient was transferred back to the ward in stable condition.  CONCLUSIONS: 1. Successful AngioJet rheolytic atherectomy with adjunct percutaneous    transluminal coronary balloon angioplasty and placement of a BX Velocity    3.0- x 28-mm stent in the proximal and mid left anterior descending artery    stenotic  lesion. 2. Moderately depressed left ventricular systolic function with wall motion     abnormalities noted above. 3. Elevated left ventricular end diastolic pressure. 4. Insertion of temporary transvenous pacing wire. 5. Adjunct use of Integrilin infusion.Dictated by:   Lenise Herald, M.D.  Attending Physician:  Darlin Priestly DD:  10/12/01 TD:  10/12/01 Job: [id number]

## 2011-05-03 NOTE — Procedures (Signed)
[name], [name] NO.:  [id number]   MEDICAL RECORD NO.:  [id number]          PATIENT TYPE:  OUT   LOCATION:  SLEEP CENTER                 FACILITY:  [facility]   PHYSICIAN:  Clinton D. Maple Hudson, M.D. DATE OF BIRTH:  1925-12-01   DATE OF STUDY:  04/29/2006                              NOCTURNAL POLYSOMNOGRAM   REFERRING PHYSICIAN:  Dr. Othelia Pulling.   INDICATIONS FOR STUDY:  Hypersomnia with sleep apnea.   EPWORTH SLEEPINESS SCORE:  03/24.   BMI:  23.7.   WEIGHT:  157 pounds.   HOME MEDICATIONS:  Diovan, Plavix, Toprol, Hytrin, aspirin, Foltx, Celebrex.   SLEEP ARCHITECTURE:  Total sleep time 309 minutes with sleep efficiency 72%.  Stage I was 8%, stage II 69%, stages III and IV 1%, REM 23% of total sleep  time.  Sleep latency 46 minutes, REM latency 62 minutes, awake after sleep  onset 79 minutes, arousal index 20.  He had taken all of his medications  prior to arrival.   RESPIRATORY DATA:  Split study protocol.  Apnea/hypopnea index (AHI, RDI)  14.7 obstructive events per hour indicating mild to moderate obstructive  sleep apnea/hypopnea syndrome before CPAP.  Normal range is 0 to 5 per hour.  This included a total of four obstructive apneas, five central apneas and 29  hypopneas before CPAP.  Events were not positional, noted mostly while  supine and on right side.  REM AHI 3.4 per hour.  CPAP was titrated to 16  CWP, AHI 0 per hour.  A medium ResMed Quattro full-face mask was used with  heated humidifier.   OXYGEN DATA:  Moderate snoring with oxygen desaturation to a nadir of 80% on  room air before CPAP.  After CPAP control saturation held 91-94% on room  air.  He was noted to a cough intermittently through the study, interfering  with his sleep.  The patient attributed this to a cold or allergy.   CARDIAC DATA:  Sinus rhythm with inverted T-wave on single lead monitor and  occasional PVCs.   MOVEMENT/PARASOMNIA:  Occasional leg jerk, insignificant.   Bathroom times  one.   IMPRESSION/RECOMMENDATIONS:  1.  Mild to moderate obstructive sleep apnea slash hypopnea syndrome, AHI      14.7 obstructive events per hour, non positional.  Moderate snoring with      oxygen desaturation to a nadir of 80% before CPAP.  2.  Successful CPAP titration to 16 CWP, AHI 0 per hour.  A medium ResMed      Quattro full-face mask was used with heated humidifier.  3.  Note report of frequent coughing through the study which fragmented      sleep to some degree.  If it is a sustained problem then specific      therapy may be appropriate.      Clinton D. Maple Hudson, M.D.  Diplomate, Biomedical engineer of Sleep Medicine  Electronically Signed     CDY/MEDQ  D:  05/04/2006 14:09:27  T:  05/05/2006 10:03:00  Job:  [id number]

## 2011-05-03 NOTE — Assessment & Plan Note (Signed)
[facility]                         GASTROENTEROLOGY OFFICE NOTE   STRATTON, VILLWOCK                     MRN:          [id number]  DATE:02/03/2007                            DOB:          06-18-1925    Mr. [name] is a 77 year old gentleman, the father of [name], our  ultra-sonographer.  He is here to discuss colorectal screening.  We saw  Mr. [name] in June of 2002 for screening colonoscopy, which was done  with findings of tubulo adenoma of the right colon.  There was also mild  diverticulosis of the left colon.  He remains asymptomatic, having  irregular bowel habits.  He denies rectal bleeding.  Mr. [name] has a  history of coronary artery disease status post supraventricular  tachycardia.  He has hyperlipidemia and a coronary artery stent placed  in the LAD in October 2001 with occlusion and re catheterization in 2002  due to restenosis with successful redilatation by Dr. Elease Randolph.   MEDICATIONS:  1. Diovan 80 mg p.o. daily.  2. Plavix 75 mg p.o. daily.  3. Toprol 100 mg p.o. daily.  4. Hytrin 5 mg p.o. daily.  5. Aspirin 81 mg p.o. daily.  6. Foltx 400 mcg p.o. daily.  7. Pepcid AC p.r.n. heartburn.  8. Proscar 5 mg p.o. daily.  9. Crestor 5 mg p.o. daily.  10.Calcium supplements.   PHYSICAL EXAM:  Blood pressure 130/72, pulse 68, and weight 161 pounds.  He was alert and oriented.  Somewhat hard of hearing.  Sclerae not icteric.  LUNGS:  Clear to auscultation.  COR:  Quiet S1, S2.  No murmur.  ABDOMEN:  Soft and nontender with normoactive bowel sounds.  Liver edge  at costal margin.  No abnormal fullness.  RECTAL:  Not done.  EXTREMITIES:  Trace edema bilaterally.   IMPRESSION:  28. A 77 year old white male with a history of tubular adenoma of the      right colon status post  polypectomy in 2002.  He is due for repeat      colonoscopy.  2. Anticoagulation with Plavix and aspirin for coronary artery stent      placed in  2002.  The patient needs to stay on the anticoagulant to      prevent restenosis.   PLAN:  Colonoscopy scheduled with routine colonoscopy prep.  The patient  will remain on aspirin and Plavix throughout the procedure.     Hedwig Morton. Juanda Chance, MD  Electronically Signed    DMB/MedQ  DD: 02/03/2007  DT: 02/03/2007  Job #: [id number]   cc:   Rosalyn Gess. Norins, MD  Cassell Clement, M.D.

## 2011-05-03 NOTE — H&P (Signed)
[place]. [facility]  Patient:    [name], [name] Visit Number: [id number] MRN: [id number]          Service Type: MED Location: [id number] Attending Physician:  Rudean Hitt Dictated by:   Darden Palmer., M.D. Admit Date:  05/07/2002   CC:         Thomas A. Patty Sermons, M.D.  [place]. Smitty Cords, M.D.   History and Physical  HISTORY: This is a 77 year old male, who has a prior history of coronary artery disease.  He had an acute anterior MI October 11, 2001 and had cardiac catheterization by Dr. Jenne Campus done acutely at that time.  He had a 3.0 x 28 mm Hepacoat stent in the proximal LAD and had angio-jet atherectomy done at that time.  He evolved an anterior MI and had Integrelin given at that time. He has had recurrent SVT, which has been present for several years.  He was admitted with prolonged tachycardia on February 12, 2002 by Dr. Patty Sermons and had abnormal troponins and cardiac enzymes associated with that event.  He underwent catheterization by Dr. [name] on February 15, 2002 and was found to have a 70% in-stent restenosis distally and 40% proximally.  There was insignificant disease in the other vessels and there was a dilated LV with an anteroapical akinesis and some dyskinesis and EF of 30-35%.  He had angioplasty done with a 3.5 x 15 mm cutting balloon with good angiographic result and good TIMI flow.  He was stable following the procedure. He was discharged on aspirin and Persantine.  About three weeks ago he developed prostatitis and fairly severe gross hematuria.  He was seen by Dr. Logan Bores and had a Foley catheter placed.  His aspirin and Plavix were stopped for a week, and he then had restarted Plavix alone approximately ten days ago.  He has had at least three episodes of recurrent supraventricular tachycardia requiring propranolol since his angioplasty back in March 2003.  Around 11 p.m. this evening he developed the onset  of mid epigastric and lower abdominal pain similar to his previous symptoms with some radiation upwards into the chest.  He thought he might be developing SVT, took a propranolol, took an aspirin, and then he took a series of three nitroglycerin, which had some transient improvement in symptoms.  He then began to get nauseated and after several hours came to the emergency room, where he was having ongoing epigastric and abdominal symptoms similar to his previous symptoms.  He was administered intravenous heparin, IV nitroglycerin, and morphine, and at the present time is currently pain-free. His EKG is unchanged from the previous ECG done in March 2003 following his angioplasty.  PAST MEDICAL HISTORY:  1. Long-standing hypertension.  2. Hyperlipidemia, treated with Lipitor.  3. Known history of supraventricular tachycardia.  PAST SURGICAL HISTORY:  1. Lumbar disk surgery by Dr. Jeral Fruit in 2001.  2. Left hip surgery in 1987 by Dr. Fannie Knee.  3. Several procedures for sleep apnea.  ALLERGIES: He develops a cough on ACE INHIBITORS.  CURRENT MEDICATIONS:  1. Lipitor 20 mg q.d.  2. Plavix 75 mg q.d.  3. Toprol-XL 50 mg q.d.  4. Flomax 0.4 mg q.d.  5. Diovan 80 mg q.d.  FAMILY HISTORY: Recorded in previously dictated records in February 2003 and unchanged.  SOCIAL HISTORY: He is married.  He does not use alcohol or tobacco to excess. He is a retired [employer] social research officer.  REVIEW OF SYSTEMS: He has  no skin problems.  Normally no eye, ear, nose, or throat problems.  He has had symptoms of indigestion and gastroesophageal reflux treated previously with Prilosec.  He has had colonoscopies for polyps in the past.  He has had significant hematuria noted as in the above history. There is no history of diabetes.  Does have some degenerative joint disease. Other than as noted above, the remainder of the Review Of Systems is unremarkable.  PHYSICAL EXAMINATION:  GENERAL: He is a pleasant male,  appearing his stated age, and is currently in no acute distress and is pain-free.  VITAL SIGNS: Blood pressure 145/86, pulse 50 and regular.  SKIN: Warm and dry.  No diaphoresis.  HEENT: EOMI.  PERRLA.  C&S clear.  Pharynx negative.  NECK: Supple without masses.  No JVD and no thyromegaly.  LUNGS: Clear.  CARDIAC: Normal S1 and S2.  No S3.  ABDOMEN: Slightly firm but is nontender.  There is no mass, no guarding. EXTREMITIES: Femoral pulses 2+, peripheral pulses 2+.  GU/RECTAL: Deferred due to cardiac cause.  IMPRESSION:  1. Lower abdominal pain, possible unstable angina pectoris.  2. Coronary artery disease with     a. Anterior myocardial infarction.     b. Angioplasty with a 3.0 x 28 mm Hepacoat stent in October 2002.     c. Cutting balloon angioplasty with 3.5 device by Dr. [name] February 15, 2002.  3. Hypertensive heart disease without heart failure.  4. Hiatal hernia with reflux.  5. Hyperlipidemia, under treatment.  6. History of sleep apnea.  7. Recent hematuria during prostatitis and benign prostatic hypertrophy.  8. Supraventricular tachycardia, which has been recurrent through the     years.  PLAN: The patient is currently pain-free on heparin and IV nitroglycerin.  We will place him on the catheterization schedule for repeat catheterization to exclude restenosis of the previously stented segment as a cause for his recurrent presentation.  Because of the frequency of the supraventricular tachycardia he could conceivably be a candidate for catheter ablation once he stabilizes from this. Dictated by:   Darden Palmer., M.D. Attending Physician:  Rudean Hitt DD:  05/07/02 TD:  05/08/02 Job: [id number]
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
## 2011-05-03 NOTE — Procedures (Signed)
[name], MALAND NO.:  [id number]   MEDICAL RECORD NO.:  [id number]          PATIENT TYPE:  OUT   LOCATION:  SLEEP CENTER                 FACILITY:  [facility]   PHYSICIAN:  Clinton D. Maple Hudson, M.D. DATE OF BIRTH:  [birth year]   DATE OF STUDY:  04/29/2006                              NOCTURNAL POLYSOMNOGRAM   REFERRING PHYSICIAN:  Dr. Othelia Pulling.   INDICATIONS FOR STUDY:  Hypersomnia with sleep apnea.   EPWORTH SLEEPINESS SCORE:  03/24.   BMI:  23.7.   WEIGHT:  157 pounds.   HOME MEDICATIONS:  Diovan, Plavix, Toprol, Hytrin, aspirin, Foltx, Celebrex.   SLEEP ARCHITECTURE:  Total sleep time 309 minutes with sleep efficiency 72%.  Stage I was 8%, stage II 69%, stages III and IV 1%, REM 23% of total sleep  time.  Sleep latency 46 minutes, REM latency 62 minutes, awake after sleep  onset 79 minutes, arousal index 20.  He had taken all of his medications  prior to arrival.   RESPIRATORY DATA:  Split study protocol.  Apnea/hypopnea index (AHI, RDI)  14.7 obstructive events per hour indicating mild to moderate obstructive  sleep apnea/hypopnea syndrome before CPAP.  Normal range is 0 to 5 per hour.  This included a total of four obstructive apneas, five central apneas and 29  hypopneas before CPAP.  Events were not positional, noted mostly while  supine and on right side.  REM AHI 3.4 per hour.  CPAP was titrated to 16  CWP, AHI 0 per hour.  A medium ResMed Quattro full-face mask was used with  heated humidifier.   OXYGEN DATA:  Moderate snoring with oxygen desaturation to a nadir of 80% on  room air before CPAP.  After CPAP control saturation held 91-94% on room  air.  He was noted to a cough intermittently through the study, interfering  with his sleep.  The patient attributed this to a cold or allergy.   CARDIAC DATA:  Sinus rhythm with inverted T-wave on single lead monitor and  occasional PVCs.   MOVEMENT/PARASOMNIA:  Occasional leg jerk, insignificant.   Bathroom times  one.   IMPRESSION/RECOMMENDATIONS:  1.  Mild to moderate obstructive sleep apnea slash hypopnea syndrome, AHI      14.7 obstructive events per hour, non positional.  Moderate snoring with      oxygen desaturation to a nadir of 80% before CPAP.  2.  Successful CPAP titration to 16 CWP, AHI 0 per hour.  A medium ResMed      Quattro full-face mask was used with heated humidifier.  3.  Note report of frequent coughing through the study which fragmented      sleep to some degree.  If it is a sustained problem then specific      therapy may be appropriate.      Clinton D. Maple Hudson, M.D.  Diplomate, Biomedical engineer of Sleep Medicine  Electronically Signed     CDY/MEDQ  D:  05/04/2006 14:09:27  T:  05/05/2006 10:02:41  Job:  [id number]
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
## 2011-05-03 NOTE — Cardiovascular Report (Signed)
. [facility]  Patient:    WEILAND, TOMICH Visit Number: [id number] MRN: [id number]          Service Type: MED Location: [id number] Attending Physician:  Rudean Hitt Dictated by:   Alvia Grove., M.D. Proc. Date: 05/07/02 Admit Date:  05/07/2002 Discharge Date: 05/08/2002   CC:         Thomas A. Patty Sermons, M.D.   Cardiac Catheterization  INDICATIONS: The patient is a 77 year old gentleman with a history of coronary artery disease and ischemic dilated cardiomyopathy. He was referred from the emergency room for episodes of chest pain and shortness of breath. He is referred for heart catheterization for further evaluation.  PROCEDURE: Left heart catheterization with coronary angiography.  HEMODYNAMICS: The left ventricular pressure is 153/31 with an aortic pressure of 152/77.  ANGIOGRAPHY: The left main coronary artery is smooth and normal.  The left anterior descending artery has a stent in the proximal segment. This stent has only minor luminal irregularities between 10 and 15%. These restenoses do not appear to obstruct flow. This appears to be about the same as his postangioplasty images in March.  His first diagonal branch was a moderate sized vessel and is basically smooth and normal. The remainder of the LAD is relatively smooth and normal.  The left circumflex artery is a fairly large vessel. It gives off a large obtuse marginal artery and is normal. The OM-1 in the posterolateral segment artery are both normal.  The right coronary artery is relatively smooth and normal and is dominant. The posterior descending artery and the posterolateral segment artery are normal.  LEFT VENTRICULOGRAM: The left ventriculogram was performed in a 30 RAO position. It reveals a moderate to secondary left ventricular dilatation with an ejection fraction of around 20%. There is mild mitral regurgitation. There is akinesis of  the anterior apex. There is dyskinesis of the apical walls. This is basically the same as his previous left ventriculograms.  COMPLICATIONS: None.  CONCLUSIONS: 1. A history of coronary artery disease but no evidence of obstructive    plaque now. His left anterior descending stent remains patent. 2. Moderate to severe left ventricular dysfunction presumably caused by    ischemia and his previous anterior apical myocardial infarction.  PLAN: We will continue with medical therapy. We will watch him overnight and anticipate discharge tomorrow. Dictated by:   Alvia Grove., M.D. Attending Physician:  Rudean Hitt DD:  05/07/02 TD:  05/10/02 Job: [id number]

## 2011-05-03 NOTE — Cardiovascular Report (Signed)
[place]. [facility]  Patient:    [name], [name] Visit Number: [id number] MRN: [id number]          Service Type: MED Location: [id number] Attending Physician:  Rudean Hitt Dictated by:   Alvia Grove., M.D. Proc. Date: 02/15/02 Admit Date:  02/12/2002   CC:         Thomas A. Patty Sermons, M.D.  Cardiac Catheterization Laboratory   Cardiac Catheterization  INDICATIONS: The patient is a 77 year old gentleman with a history of a rather large anterior wall myocardial infarction back in October. He is status post successful PTCA and stenting of his proximal LAD. He recently presented with an episode of supraventricular tachycardia, which resolved with adenosine. He was kept overnight for observation and was found to rule in for a small myocardial infarction with serial CPKs. He was scheduled for heart catheterization based on this enzyme abnormality.  PROCEDURES: Left heart catheterization with coronary angiography. He had PTCA/Cutting Balloon of the proximal left anterior descending stent.  DESCRIPTION OF PROCEDURE: The right femoral artery was easily cannulated using a modified Seldinger technique.  HEMODYNAMICS: The left ventricular pressure is 148/6 with an aortic pressure of 151/73.  ANGIOGRAPHY: The left main coronary artery is smooth and normal.  The left anterior descending artery has a stent proximally. There is a 40% stenosis within the proximal aspect of this stent. There is a 70% in-stent re-stenosis in the distal aspect of the stent. The remainder of the LAD is unremarkable.  The left circumflex artery is smooth and normal.  The right coronary artery is large and dominant. It is quite tortuous and there is a proximal shepherds crook. There is no significant stenosis. The posterior descending artery and the posterolateral segment artery are normal.  LEFT VENTRICULOGRAM: The left ventriculogram was performed in a  30 RAO position. It reveals a mildly to moderately dilated left ventricle with moderate left ventricular dysfunction. The ejection fraction is between 30-35%. There is anterior apical akinesis/dyskinesis. The remaining wall seemed to contract fairly normally.  PERCUTANEOUS TRANSLUMINAL CORONARY ANGIOPLASTY: The patient was given 3600 units of heparin followed by a double bolus Integrilin drip. The left main was cannulated using a 7 French Judkins left 4 catheter.  A trooper guide wire was placed down the left anterior descending artery. A 3.5 x 15 mm Cutting Balloon was positioned in the distal aspect of the stent. It was inflated twice up to 7 atmospheres for 60 seconds and then 48 seconds. It was then pulled back to the proximal aspect of the stent and was inflated up to 7 atmospheres for 34 seconds and then 32 seconds. This resulted in a very nice angiographic result. There was still perhaps a 5-10% residual stenosis in the distal aspect of the stent.  There is no significant stenosis in the proximal aspect of the stent. There is good TIMI grade 3 flow throughout the stent.  COMPLICATIONS: None.  CONCLUSIONS: Successful percutaneous transluminal coronary angioplasty/Cutting Balloon reexpansion of the proximal left anterior descending stent. The patient does have significant left ventricular dysfunction. He is already on Toprol-XL 100 mg a day and is on Diovan. We may need to think about adding Coreg. He has been on Toprol-XL for his post myocardial infarction care, as well as his supraventricular tachycardia. We need to maximize his congestive heart failure medicines.  He also has significant akinesis/dyskinesis of his anterior apical wall. We may need to consider Coumadin therapy if this area does not heal and certainly  if he develops any sort of evidence of thrombus. He is stable in the catheterization lab. Dictated by:   Alvia Grove., M.D. Attending Physician:   Rudean Hitt DD:  02/15/02 TD:  02/15/02 Job: [id number]

## 2011-05-08 NOTE — Op Note (Signed)
[name], [name]              ACCOUNT NO.:  [id number]  MEDICAL RECORD NO.:  [id number]           PATIENT TYPE:  I  LOCATION:  1515                         FACILITY:  [facility]  PHYSICIAN:  Ollen Gross, M.D.    DATE OF BIRTH:  [birth year]  DATE OF PROCEDURE:  05/01/2011 DATE OF DISCHARGE:                              OPERATIVE REPORT   PREOPERATIVE DIAGNOSIS:  Failed polyethylene left total hip arthroplasty.  POSTOPERATIVE DIAGNOSIS:  Failed polyethylene left total hip arthroplasty.  PROCEDURE:  Left hip acetabular polyethylene revision.  SURGEON:  Ollen Gross, MD  ASSISTANT:  Alexzandrew L. Julien Girt, PA-C  ANESTHESIA:  General.  ESTIMATED BLOOD LOSS:  100.  DRAIN:  Hemovac x1.  COMPLICATIONS:  None.  CONDITION:  Stable to Recovery.  BRIEF CLINICAL NOTE:  [name] is an 77 year old male who had a left total hip arthroplasty done 15 years ago.  He has gone on to wear out his polyethylene.  He has had significant eccentric wear.  Started to get some discomfort in the groin.  The components looked well fixed on radiographs.  He does not have evidence of any significant osteolysis in the acetabulum.  There was a little bit of proximal femoral lysis, but no signs of loosening of the component.  He presents now for polyethylene revision versus total hip revision.  PROCEDURE IN DETAIL:  After successful administration of general anesthetic, the patient was placed in the right lateral decubitus position with the left side up and held with a hip positioner.  Left lower extremity was isolated from its perineum with plastic drapes and prepped and draped in usual sterile fashion.  Short posterolateral incision was made with a 10 blade through subcutaneous tissue to the fascia lata which was incised in line with the skin incision.  The sciatic nerve was palpated and protected and then the posterior pseudocapsule excised off the femur.  We did not encounter any  intra- articular fluid.  There was some intra-articular wear debris which was all debrided.  We cleared off the edge of the acetabular shell and dislocated the hip.  The femoral component was in good position and is well fixed.  I removed the femoral head from the trunnion and then retracted the femur anteriorly to gain acetabular exposure.  Tissues cleaned from the edge of the cup again and then the acetabular liner was removed from the acetabular shell.  This was a 54/56 shell. We placed a trial liner which is a 32 mm internal diameter for a 54, 56 Osteonics cup.  The liner was impacted and then the trial head which was a 32 +10 was placed.  The hip was reduced with outstanding stability. There was full extension and external rotation, 70 degrees flexion, 40 degrees adduction, 90 degrees internal rotation, 90 degrees of flexion, and about 50 degrees of internal rotation.  By placing the left leg on top of the right, I felt as though the leg lengths were equal.  Hip was then dislocated and trials were removed.  Femur was again retracted anteriorly and then a permanent 32 mm 10 degree liner was impacted into the acetabular shell  with a 10 degree wall in approximally the 2 o'clock position.  The permanent 32 +10 Morse taper head was placed and then the hip was reduced with the same stability parameters.  We used a +10 head because that is what came out from his previous hip.  Wound was then copiously irrigated with saline solution.  Note that the acetabular component was in good position and was well fixed.  Wound was copiously irrigated with saline solution, then the short rotators and capsule reattached to the femur through drill holes with Ethibond suture. Fascia lata was closed over Hemovac drain with interrupted #1 Vicryl, subcutaneous closed with #1-0 and #2-0 Vicryl, and subcuticular closed with a running 4-0 Monocryl.  The incision was then cleaned and dried. The catheter for  Marcaine pain pump was placed and the pump was initiated.  Steri-Strips and a bulky sterile dressing were applied and he was placed into a knee immobilizer, awakened, and transported to Recovery in stable condition.     Ollen Gross, M.D.     FA/MEDQ  D:  05/01/2011  T:  05/02/2011  Job:  [id number]  Electronically Signed by Ollen Gross M.D. on 05/08/2011 12:55:04 PM

## 2011-05-08 NOTE — Discharge Summary (Signed)
[name], ESGUERRA              ACCOUNT NO.:  [id number]  MEDICAL RECORD NO.:  [id number]           PATIENT TYPE:  I  LOCATION:  1515                         FACILITY:  [facility]  PHYSICIAN:  Ollen Gross, M.D.    DATE OF BIRTH:  06-13-1925  DATE OF ADMISSION:  05/01/2011 DATE OF DISCHARGE:  05/03/2011                              DISCHARGE SUMMARY   ADMITTING DIAGNOSES: 1. 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 2. Sleep apnea. 3. History of myocardial infarction. 4. Coronary arterial disease, status post coronary stenting. 5. Degenerative disk disease. 6. History of paroxysmal supraventricular tachycardia. 7. History of benign prostatic hypertrophy. 8. Hypertension.  DISCHARGE DIAGNOSES: 1. Failed polyethylene of left total hip arthroplasty status post     revision left total hip, head and liner exchange. 2. Sleep apnea. 3. History of myocardial infarction. 4. Coronary arterial disease, status post coronary stenting. 5. Degenerative disk disease. 6. History of paroxysmal supraventricular tachycardia. 7. History of benign prostatic hypertrophy. 8. Hypertension.  PROCEDURE:  May 01, 2011, left hip acetabular polyethylene revision with head liner exchange.  Surgeon, Dr. Lequita Halt.  Assistant, [name], P.A.C.  Anesthesia, general.  CONSULTS:  None.  BRIEF HISTORY:  [name] is an 77 year old male with left total hip done approximately 15 years ago.  He has gone on to have polyethylene wear, has eccentric wearing of the acetabular liner.  The components looked very well fixed on radiograph and do not have any evidence of osteolysis.  He has a little bit of proximal femoral lysis but no signs of loosening.  It is felt he would benefit from undergoing head liner exchange and revision.  Risks and benefits have been discussed.  He elected to proceed with surgery.  LABORATORY DATA:  Preop CBC showed hemoglobin preop of 13.0,  hematocrit of 39.7, white cell count 6.3, platelets 151.  PT/INR 13.41 with PTT of 29.  Chem panel on admission, slightly elevated potassium preop of 5.3, BUN 25, creatinine slightly elevated to 1.56.  Remaining Chem panel all within normal limits.  Preop UA was negative.  Blood group type is O positive.  Nasal swabs were negative for staph aureus and negative for MRSA.  Serial CBCs were followed for 2 days.  Hemoglobin dropped down to 11 where it stabilized and last noted H and H of 11.5.  White count remained normal.  Serial protime were followed per Coumadin protocol. Last noted PT/INR on postop day #2 was 17.5 with an INR 1.41.  Serial BMETs were followed for 48 hours.  The slightly elevated BUN and creatinine preop came down to normal levels of 17 and 1.41, may have just been a little bit dehydration component preop but his BUN and creatinine were normal on day #1 and day #2.  The potassium also came down to a normal level of 4.0 and remained normal for 2 days.  EKG dated February 2012, marked sinus bradycardia; lateral infarct, age indeterminate; ST and T-wave abnormalities; consider inferior ischemia; marked sinus bradycardia since September 27, 2009; no change; rate of 43, confirmed by Dr. Patty Sermons.  HOSPITAL COURSE:  The patient  was admitted to [facility], taken to OR, underwent the above-stated procedure.  It was noted upon induction of his anesthetic the patient had severe elevation in his blood pressures with systolic above 200 and his diastolic near 110.  He was treated with IV medications through the anesthesia department and responded very quickly and his pressure stabilized prior to proceeding with the surgery.  He remained stable during the procedure, later was transferred to the recovery room and was recommended to go to the telemetry floor.  He was placed on telemetry postop, did very well on the evening of surgery.  He was seen on morning of day #1 by  Dr. Lequita Halt.  He was initially placed on p.o. and IV analgesics.  He was actually doing very well.  His pulse was about 48 but he was on calcium channel blocker and also beta blockers and his rate was in the 50s preop.  He is also noted to have rate of 43 on his preop EKG from February, so he was a blocked heart rate patient because of his heart history.  His pressure was very good with systolic in the 120s on postop day #1.  Therefore, he did not have any further problems with elevated pressure, felt to be due to anesthetic induction.  He was placed back on his cardiac meds including his Diovan and his metoprolol.  He has a prior history of SVT and we wanted to make sure that he remained stable. We monitored his pressures closely and also was heart rate, started back on all of his home meds.  Hemoglobin was 11 on day #1.  He was doing very well.  Since he only had liner exchange, we did not have to adjust the components.  He was allowed to be weightbearing as tolerated. Actually got up with therapy on the first day, doing few steps, turn and pivot.  By that afternoon though he actually got up and walked in the hallway approximately 300 feet.  He did extremely well and was seen on the morning of day #2 by Dr. Lequita Halt, doing well, no complaints. Dressing changed.  Incision looked good.  He was tolerating his p.o. meds.  Since he was doing so well, he was discharged home.  It was noted he was put on Coumadin protocol.  Since he was doing so well, we will have him on  Coumadin for total of 9 to 10 days.  He is normally on Plavix.  His Plavix was held postop.  We would resume his Plavix as soon as he completes approximately 10-day course of Coumadin.  DISCHARGE/PLAN: 1. The patient is discharged home on May 03, 2011. 2. Discharge diagnoses, please see above. 3. Discharge meds:  Current medications at time of discharge include: 1. Robaxin. 2. OxyIR. 3. Coumadin protocol.  He will be on  Coumadin for approximately 9 to     10 days.  His last dose of Coumadin will be on Saturday May 11, 2011. 4. Resume aspirin. 5. Celebrex. 6. Crestor. 7. Diovan/HCTZ 8. Donepezil. 9. Hytrin 10.Metoprolol. 11.Proscar.  Medications on hold include his Plavix but he will resume his Plavix on Sunday May 12, 2011, the day after completing his Coumadin protocol. His vitamins and supplements remain on hold while he is on his Coumadin.  DIET:  Heart-healthy cardiac diet.  ACTIVITY:  He is weightbearing as tolerated to the left lower extremity, will have home health PT, home health nursing for gait training, ambulation, ADLs.  Follow up in the office in approximately 2 weeks.  DISPOSITION:  He will be going home today.  CONDITION ON DISCHARGE:  Improved.     Charles L. Julien Girt, P.A.C.   ______________________________ Ollen Gross, M.D.    ALP/MEDQ  D:  05/03/2011  T:  05/03/2011  Job:  [id number]  cc:   Windle Guard, M.D. Fax: [phone]  Jamison Neighbor, M.D. Fax: [phone]  Cassell Clement, M.D. Fax: [phone]  Electronically Signed by Patrica Duel P.A.C. on 05/06/2011 11:07:16 AM Electronically Signed by Ollen Gross M.D. on 05/08/2011 12:55:06 PM

## 2011-05-20 ENCOUNTER — Other Ambulatory Visit: Payer: Self-pay | Admitting: Urology

## 2011-05-20 DIAGNOSIS — N200 Calculus of kidney: Secondary | ICD-10-CM

## 2011-05-27 ENCOUNTER — Telehealth: Payer: Self-pay | Admitting: Cardiology

## 2011-05-27 NOTE — Telephone Encounter (Signed)
Called in today wanting to know if we had any samples of Crestor to give to her husband. Please call back and feel free to leave a message on the voice mail if you are unable to get them.

## 2011-05-27 NOTE — Telephone Encounter (Signed)
Samples gathered for patient 

## 2011-05-29 ENCOUNTER — Telehealth: Payer: Self-pay | Admitting: Cardiology

## 2011-05-29 NOTE — Telephone Encounter (Signed)
Pt's wife called wanted samples of plavix and celebrex please call and let her know if we have any

## 2011-05-29 NOTE — Telephone Encounter (Signed)
Samples gathered for patient

## 2011-06-03 ENCOUNTER — Other Ambulatory Visit: Payer: Self-pay | Admitting: *Deleted

## 2011-06-03 DIAGNOSIS — R413 Other amnesia: Secondary | ICD-10-CM

## 2011-06-03 MED ORDER — DONEPEZIL HCL 10 MG PO TABS: 10.0000 mg | ORAL_TABLET | Freq: Every evening | ORAL | Status: DC | PRN

## 2011-06-03 NOTE — Telephone Encounter (Signed)
Faxed refill to [facility] for donepezil

## 2011-06-10 ENCOUNTER — Ambulatory Visit
Admission: RE | Admit: 2011-06-10 | Discharge: 2011-06-10 | Disposition: A | Discharge status: Home / Self Care | Payer: Medicare Other | Source: Ambulatory Visit | Attending: Urology | Admitting: Urology

## 2011-06-10 ENCOUNTER — Telehealth: Payer: Self-pay | Admitting: Cardiology

## 2011-06-10 DIAGNOSIS — N200 Calculus of kidney: Secondary | ICD-10-CM

## 2011-06-10 NOTE — Telephone Encounter (Signed)
Pt's wife called wants samples for Don of plavix celebrex and crestor

## 2011-06-11 NOTE — Telephone Encounter (Signed)
Samples of crestor gathered for patient, advised wife

## 2011-07-11 ENCOUNTER — Other Ambulatory Visit: Payer: Self-pay | Admitting: Cardiology

## 2011-07-11 DIAGNOSIS — I259 Chronic ischemic heart disease, unspecified: Secondary | ICD-10-CM

## 2011-07-30 ENCOUNTER — Other Ambulatory Visit: Payer: Self-pay | Admitting: Cardiology

## 2011-07-30 DIAGNOSIS — M1990 Unspecified osteoarthritis, unspecified site: Secondary | ICD-10-CM

## 2011-07-30 NOTE — Telephone Encounter (Signed)
Pt had been using wife's Celebrex.  Pt's wife states to please call in new script for Celebrex 200mg  for himself.  Pt nearly out, wife states does not need fast shipping, Pt uses [facility]

## 2011-07-31 ENCOUNTER — Encounter: Payer: Self-pay | Admitting: Cardiology

## 2011-07-31 MED ORDER — CELECOXIB 200 MG PO CAPS: 200.0000 mg | ORAL_CAPSULE | Freq: Every day | ORAL | Status: DC

## 2011-07-31 NOTE — Telephone Encounter (Signed)
Ok to fill per  Dr. Brackbill   

## 2011-08-01 ENCOUNTER — Encounter: Payer: Self-pay | Admitting: Cardiology

## 2011-08-12 ENCOUNTER — Encounter: Payer: Self-pay | Admitting: Cardiology

## 2011-08-12 ENCOUNTER — Other Ambulatory Visit: Payer: Self-pay | Admitting: Cardiology

## 2011-08-12 ENCOUNTER — Other Ambulatory Visit (INDEPENDENT_AMBULATORY_CARE_PROVIDER_SITE_OTHER): Payer: Medicare Other | Admitting: *Deleted

## 2011-08-12 ENCOUNTER — Ambulatory Visit (INDEPENDENT_AMBULATORY_CARE_PROVIDER_SITE_OTHER): Payer: Medicare Other | Admitting: Cardiology

## 2011-08-12 VITALS — BP 160/80 | HR 60 | Wt 165.0 lb

## 2011-08-12 DIAGNOSIS — N400 Enlarged prostate without lower urinary tract symptoms: Secondary | ICD-10-CM

## 2011-08-12 DIAGNOSIS — E785 Hyperlipidemia, unspecified: Secondary | ICD-10-CM

## 2011-08-12 DIAGNOSIS — I219 Acute myocardial infarction, unspecified: Secondary | ICD-10-CM

## 2011-08-12 DIAGNOSIS — I471 Supraventricular tachycardia: Secondary | ICD-10-CM

## 2011-08-12 DIAGNOSIS — I10 Essential (primary) hypertension: Secondary | ICD-10-CM

## 2011-08-12 DIAGNOSIS — I2510 Atherosclerotic heart disease of native coronary artery without angina pectoris: Secondary | ICD-10-CM

## 2011-08-12 DIAGNOSIS — I119 Hypertensive heart disease without heart failure: Secondary | ICD-10-CM

## 2011-08-12 DIAGNOSIS — I259 Chronic ischemic heart disease, unspecified: Secondary | ICD-10-CM

## 2011-08-12 DIAGNOSIS — Z8679 Personal history of other diseases of the circulatory system: Secondary | ICD-10-CM

## 2011-08-12 DIAGNOSIS — M1990 Unspecified osteoarthritis, unspecified site: Secondary | ICD-10-CM

## 2011-08-12 DIAGNOSIS — E7800 Pure hypercholesterolemia, unspecified: Secondary | ICD-10-CM

## 2011-08-12 DIAGNOSIS — I498 Other specified cardiac arrhythmias: Secondary | ICD-10-CM

## 2011-08-12 LAB — BASIC METABOLIC PANEL
BUN: 27 mg/dL — ABNORMAL HIGH (ref 6–23)
CO2: 27 mEq/L (ref 19–32)
Chloride: 107 mEq/L (ref 96–112)
Potassium: 4.4 mEq/L (ref 3.5–5.1)

## 2011-08-12 LAB — HEPATIC FUNCTION PANEL
ALT: 19 U/L (ref 0–53)
AST: 34 U/L (ref 0–37)
Bilirubin, Direct: 0.1 mg/dL (ref 0.0–0.3)
Total Bilirubin: 0.7 mg/dL (ref 0.3–1.2)
Total Protein: 6.6 g/dL (ref 6.0–8.3)

## 2011-08-12 LAB — LIPID PANEL
Cholesterol: 141 mg/dL (ref 0–200)
LDL Cholesterol: 74 mg/dL (ref 0–99)

## 2011-08-12 NOTE — Assessment & Plan Note (Signed)
The patient is on Crestor 10 mg daily.  He's not having a myalgias.  Blood work is pending.

## 2011-08-12 NOTE — Assessment & Plan Note (Signed)
The patient has not been experiencing any chest pain or angina pectoris. 

## 2011-08-12 NOTE — Progress Notes (Signed)
[name] Date of Birth:  [birth year] [facility] / [facility] [address]   [address] [place], [place]  [address] [phone]           Fax   [phone]  History of Present Illness: This pleasant 77 year old gentleman is seen for a scheduled followup office visit.  He has a history of known ischemic heart disease.  He had angioplasty and stent in 2002 and another angioplasty in 2003.  In November 2009 he had a nuclear stress test which did not show any reversible ischemia.  He had another nuclear stress test on 02/28/11 as preop clearance for a hip revision procedure and the nuclear stress test showed no evidence of ischemia but did show an old scar.  The patient denies any recent chest discomfort or shortness of breath.  He's had no dizziness or syncope.  He's had occasional short runs of paroxysmal supraventricular tachycardia which he can abort by drinking cold water or drinking some soda and burping.  Current Outpatient Prescriptions  Medication Sig Dispense Refill  . aspirin 81 MG tablet Take 81 mg by mouth daily.        [name] CALCIUM PO Take by mouth.        . celecoxib (CELEBREX) 200 MG capsule Take 1 capsule (200 mg total) by mouth daily.  90 capsule  3  . Cholecalciferol (VITAMIN D PO) Take by mouth.        . clopidogrel (PLAVIX) 75 MG tablet TAKE 1 TABLET DAILY  90 tablet  3  . DIOVAN HCT 160-12.5 MG per tablet TAKE 1 TABLET DAILY  90 tablet  3  . donepezil (ARICEPT) 10 MG tablet Take 1 tablet (10 mg total) by mouth at bedtime as needed.  90 tablet  3  . Famotidine (PEPCID PO) Take by mouth. As needed      . finasteride (PROSCAR) 5 MG tablet Take 1 tablet (5 mg total) by mouth daily.  90 tablet  3  . metoprolol (LOPRESSOR) 50 MG tablet Take 50 mg by mouth 2 (two) times daily.        . Multiple Vitamin (MULTI-VITAMIN PO) Take by mouth.        . rosuvastatin (CRESTOR) 10 MG tablet Take 5 mg by mouth daily.       [name] terazosin (HYTRIN) 5 MG capsule TAKE 1 CAPSULE  DAILY  90 capsule  3  . VITAMIN A PO Take by mouth.          Allergies  Allergen Reactions  . Prednisone     REACTION: causes tachycardia    Patient Active Problem List  Diagnoses  . TUBULOVILLOUS ADENOMA, COLON  . HYPERLIPIDEMIA  . HYPERTENSION  . MYOCARDIAL INFARCTION  . CORONARY ARTERY DISEASE  . GERD  . DIVERTICULOSIS, COLON  . DEGENERATIVE JOINT DISEASE, CERVICAL SPINE  . TRIGGER FINGER  . COUGH  . HYPERGLYCEMIA  . CARPAL TUNNEL SYNDROME, HX OF  . SUPRAVENTRICULAR TACHYCARDIA, HX OF  . NEPHROLITHIASIS, HX OF  . BENIGN PROSTATIC HYPERTROPHY, HX OF  . Unspecified Personal History Presenting Hazards to Health  . APPENDECTOMY, HX OF  . PERCUTANEOUS TRANSLUMINAL CORONARY ANGIOPLASTY, HX OF  . CARPAL TUNNEL RELEASE, RIGHT, HX OF    History  Smoking status  . Never Smoker   Smokeless tobacco  . Not on file    History  Alcohol Use No    Family History  Problem Relation Age of Onset  . Heart attack Father   . Stroke  Father     Review of Systems: Constitutional: no fever chills diaphoresis or fatigue or change in weight.  Head and neck: no hearing loss, no epistaxis, no photophobia or visual disturbance. Respiratory: No cough, shortness of breath or wheezing. Cardiovascular: No chest pain peripheral edema, palpitations. Gastrointestinal: No abdominal distention, no abdominal pain, no change in bowel habits hematochezia or melena. Genitourinary: No dysuria, no frequency, no urgency, no nocturia. Musculoskeletal:No arthralgias, no back pain, no gait disturbance or myalgias. Neurological: No dizziness, no headaches, no numbness, no seizures, no syncope, no weakness, no tremors. Hematologic: No lymphadenopathy, no easy bruising. Psychiatric: No confusion, no hallucinations, no sleep disturbance.    Physical Exam: Filed Vitals:   08/12/11 0942  BP: 160/80  Pulse: 60  The general appearance reveals a well-developed well-nourished elderly gentleman in no  distress.The head and neck exam reveals pupils equal and reactive.  Extraocular movements are full.  There is no scleral icterus.  The mouth and pharynx are normal.  The neck is supple.  The carotids reveal no bruits.  The jugular venous pressure is normal.  The  thyroid is not enlarged.  There is no lymphadenopathy.  The chest is clear to percussion and auscultation.  There are no rales or rhonchi.  Expansion of the chest is symmetrical.  The precordium is quiet.  The first heart sound is normal.  The second heart sound is physiologically split.  There is no murmur gallop rub or click.  There is no abnormal lift or heave.  The abdomen is soft and nontender.  The bowel sounds are normal.  The liver and spleen are not enlarged.  There are no abdominal masses.  There are no abdominal bruits.  Extremities reveal good pedal pulses.  There is no phlebitis or edema.  There is no cyanosis or clubbing.  Strength is normal and symmetrical in all extremities.  There is no lateralizing weakness.  There are no sensory deficits.  The skin is warm and dry.  There is no rash.     Assessment / Plan: Continue same medication.  Recheck in 6 months for followup office visit and fasting lab work

## 2011-08-12 NOTE — Assessment & Plan Note (Signed)
Patient has had no recent episodes of supraventricular tachycardia.

## 2011-08-12 NOTE — Assessment & Plan Note (Signed)
The patient had recent successful revision of his left hip surgery and did well with no cardiac complications.

## 2011-08-14 ENCOUNTER — Telehealth: Payer: Self-pay | Admitting: *Deleted

## 2011-08-14 NOTE — Telephone Encounter (Signed)
Mailed copy of labs 

## 2011-08-14 NOTE — Telephone Encounter (Signed)
Message copied by Eugenia Pancoast on Wed Aug 14, 2011 12:41 PM ------      Message from: Cassell Clement      Created: Tue Aug 13, 2011  8:26 AM       Lipids are very good. LFTs are normal.      BS is better.      Kidneys dry so drink plenty of water including when he comes for fasting lab work

## 2011-08-14 NOTE — Progress Notes (Signed)
Mailed copy of labs 

## 2011-08-20 ENCOUNTER — Other Ambulatory Visit: Payer: Self-pay | Admitting: *Deleted

## 2011-08-20 DIAGNOSIS — I119 Hypertensive heart disease without heart failure: Secondary | ICD-10-CM

## 2011-08-20 MED ORDER — METOPROLOL TARTRATE 50 MG PO TABS: 50.0000 mg | ORAL_TABLET | Freq: Two times a day (BID) | ORAL | Status: DC

## 2011-08-20 NOTE — Telephone Encounter (Signed)
escribe request Refill error, resent to [facility]

## 2011-09-13 ENCOUNTER — Telehealth: Payer: Self-pay | Admitting: Cardiology

## 2011-09-13 NOTE — Telephone Encounter (Signed)
Gathered samples of crestor

## 2011-09-13 NOTE — Telephone Encounter (Signed)
Pt wants samples of aricept 10 mg, crestor 5 mg please call

## 2011-09-17 LAB — BASIC METABOLIC PANEL
BUN: 18
BUN: 22
CO2: 28
Calcium: 8.1 — ABNORMAL LOW
Chloride: 104
Creatinine, Ser: 1.35
Creatinine, Ser: 1.38
GFR calc Af Amer: >60
GFR calc non Af Amer: 49 — ABNORMAL LOW
Glucose, Bld: 121 — ABNORMAL HIGH
Glucose, Bld: 157 — ABNORMAL HIGH

## 2011-09-17 LAB — CBC
HCT: 26.2 — ABNORMAL LOW
HCT: 39.4
MCHC: 32.6
MCHC: 33.5
MCHC: 34.1
MCV: 92.2
MCV: 92.3
MCV: 92.4
Platelets: 113 — ABNORMAL LOW
Platelets: 123 — ABNORMAL LOW
Platelets: 139 — ABNORMAL LOW
Platelets: 161
RBC: 2.86 — ABNORMAL LOW
RBC: 3.24 — ABNORMAL LOW
RDW: 13.2
RDW: 13.2
RDW: 13.4
WBC: 13 — ABNORMAL HIGH
WBC: 14.5 — ABNORMAL HIGH

## 2011-09-17 LAB — COMPREHENSIVE METABOLIC PANEL
Albumin: 3.6
BUN: 23
Calcium: 9.2
Creatinine, Ser: 1.33
Total Bilirubin: 0.7
Total Protein: 6.8

## 2011-09-17 LAB — TYPE AND SCREEN: Antibody Screen: NEGATIVE

## 2011-09-17 LAB — PROTIME-INR
INR: 1
INR: 1.2
INR: 1.6 — ABNORMAL HIGH
Prothrombin Time: 15.8 — ABNORMAL HIGH
Prothrombin Time: 19.9 — ABNORMAL HIGH
Prothrombin Time: 20.8 — ABNORMAL HIGH

## 2011-09-17 LAB — APTT: aPTT: 26

## 2011-09-17 LAB — URINALYSIS, ROUTINE W REFLEX MICROSCOPIC
Glucose, UA: NEGATIVE
Hgb urine dipstick: NEGATIVE
Ketones, ur: NEGATIVE
Protein, ur: NEGATIVE

## 2011-09-17 LAB — PREPARE RBC (CROSSMATCH)

## 2011-09-17 LAB — ABO/RH: ABO/RH(D): O POS

## 2011-11-06 ENCOUNTER — Telehealth: Payer: Self-pay | Admitting: Cardiology

## 2011-11-06 NOTE — Telephone Encounter (Signed)
New message:  Pt is needing samples of Crestor 5 mg.  Please call and let them know if any are available. [phone]

## 2011-11-06 NOTE — Telephone Encounter (Signed)
Crestor 5 mg x 4 lot # [id number] exp 09/15 gathered for patient, advised wife

## 2011-11-26 ENCOUNTER — Telehealth: Payer: Self-pay | Admitting: Cardiology

## 2011-11-27 ENCOUNTER — Other Ambulatory Visit: Payer: Self-pay | Admitting: *Deleted

## 2011-11-27 DIAGNOSIS — I119 Hypertensive heart disease without heart failure: Secondary | ICD-10-CM

## 2011-11-27 MED ORDER — METOPROLOL TARTRATE 50 MG PO TABS: 50.0000 mg | ORAL_TABLET | Freq: Two times a day (BID) | ORAL | Status: DC

## 2011-11-27 NOTE — Telephone Encounter (Signed)
Called to let patient know we called refill

## 2011-11-27 NOTE — Telephone Encounter (Signed)
Refilled metoprolol 

## 2011-11-27 NOTE — Telephone Encounter (Signed)
New message:  Pt needs Metoprolol 8-10 pills called into [facility].  [phone] to hold him until his mail order comes in.  He is out.

## 2011-11-28 ENCOUNTER — Other Ambulatory Visit: Payer: Self-pay | Admitting: *Deleted

## 2011-12-04 ENCOUNTER — Telehealth: Payer: Self-pay | Admitting: Cardiology

## 2011-12-04 NOTE — Telephone Encounter (Signed)
New message:  Do you have any samples of Crestor that patient can get?  Please call wife and let him know if she can pick any up.

## 2011-12-04 NOTE — Telephone Encounter (Signed)
Crestor 10 mg 1/2 daily #28 lot #[id number] exp 09/15. Left message

## 2011-12-04 NOTE — Telephone Encounter (Signed)
erore

## 2011-12-30 ENCOUNTER — Other Ambulatory Visit: Payer: Self-pay | Admitting: Cardiology

## 2011-12-30 DIAGNOSIS — I259 Chronic ischemic heart disease, unspecified: Secondary | ICD-10-CM

## 2011-12-30 MED ORDER — CLOPIDOGREL BISULFATE 75 MG PO TABS: 75.0000 mg | ORAL_TABLET | Freq: Every day | ORAL | Status: DC

## 2011-12-30 MED ORDER — VALSARTAN-HYDROCHLOROTHIAZIDE 160-12.5 MG PO TABS: 1.0000 | ORAL_TABLET | Freq: Every day | ORAL | Status: DC

## 2012-01-28 ENCOUNTER — Telehealth: Payer: Self-pay | Admitting: Cardiology

## 2012-01-28 NOTE — Telephone Encounter (Signed)
New Msg: Pt wife calling wanting to know if we have any crestor 5 mg samples. Please return pt wife call to discuss further.

## 2012-01-29 NOTE — Telephone Encounter (Signed)
Done by Hector Brunswick

## 2012-01-31 ENCOUNTER — Encounter (HOSPITAL_COMMUNITY)
Admission: EM | Disposition: A | Discharge status: Home / Self Care | Payer: Self-pay | Source: Home / Self Care | Attending: Emergency Medicine

## 2012-01-31 ENCOUNTER — Emergency Department (HOSPITAL_COMMUNITY): Payer: Medicare Other

## 2012-01-31 ENCOUNTER — Emergency Department (HOSPITAL_COMMUNITY): Payer: Medicare Other | Admitting: Anesthesiology

## 2012-01-31 ENCOUNTER — Other Ambulatory Visit: Payer: Self-pay

## 2012-01-31 ENCOUNTER — Encounter (HOSPITAL_COMMUNITY): Payer: Self-pay | Admitting: Anesthesiology

## 2012-01-31 ENCOUNTER — Ambulatory Visit (HOSPITAL_COMMUNITY)
Admission: EM | Admit: 2012-01-31 | Discharge: 2012-01-31 | Disposition: A | Discharge status: Home / Self Care | Payer: Medicare Other | Attending: Emergency Medicine | Admitting: Emergency Medicine

## 2012-01-31 ENCOUNTER — Encounter (HOSPITAL_COMMUNITY): Payer: Self-pay

## 2012-01-31 DIAGNOSIS — M1990 Unspecified osteoarthritis, unspecified site: Secondary | ICD-10-CM | POA: Insufficient documentation

## 2012-01-31 DIAGNOSIS — E7800 Pure hypercholesterolemia, unspecified: Secondary | ICD-10-CM | POA: Insufficient documentation

## 2012-01-31 DIAGNOSIS — I2510 Atherosclerotic heart disease of native coronary artery without angina pectoris: Secondary | ICD-10-CM | POA: Insufficient documentation

## 2012-01-31 DIAGNOSIS — X500XXA Overexertion from strenuous movement or load, initial encounter: Secondary | ICD-10-CM | POA: Insufficient documentation

## 2012-01-31 DIAGNOSIS — Z79899 Other long term (current) drug therapy: Secondary | ICD-10-CM | POA: Insufficient documentation

## 2012-01-31 DIAGNOSIS — T84029A Dislocation of unspecified internal joint prosthesis, initial encounter: Secondary | ICD-10-CM | POA: Insufficient documentation

## 2012-01-31 DIAGNOSIS — S73006A Unspecified dislocation of unspecified hip, initial encounter: Secondary | ICD-10-CM

## 2012-01-31 DIAGNOSIS — Z7982 Long term (current) use of aspirin: Secondary | ICD-10-CM | POA: Insufficient documentation

## 2012-01-31 DIAGNOSIS — Z9861 Coronary angioplasty status: Secondary | ICD-10-CM | POA: Insufficient documentation

## 2012-01-31 DIAGNOSIS — Z96649 Presence of unspecified artificial hip joint: Secondary | ICD-10-CM | POA: Insufficient documentation

## 2012-01-31 DIAGNOSIS — N400 Enlarged prostate without lower urinary tract symptoms: Secondary | ICD-10-CM | POA: Insufficient documentation

## 2012-01-31 DIAGNOSIS — I252 Old myocardial infarction: Secondary | ICD-10-CM | POA: Insufficient documentation

## 2012-01-31 HISTORY — PX: HIP CLOSED REDUCTION: SHX983

## 2012-01-31 HISTORY — DX: Unspecified dislocation of unspecified hip, initial encounter: S73.006A

## 2012-01-31 LAB — BASIC METABOLIC PANEL
BUN: 30 mg/dL — ABNORMAL HIGH (ref 6–23)
CO2: 29 mEq/L (ref 19–32)
Calcium: 9.6 mg/dL (ref 8.4–10.5)
Creatinine, Ser: 1.59 mg/dL — ABNORMAL HIGH (ref 0.50–1.35)
Glucose, Bld: 119 mg/dL — ABNORMAL HIGH (ref 70–99)

## 2012-01-31 LAB — DIFFERENTIAL
Basophils Absolute: 0 10*3/uL (ref 0.0–0.1)
Eosinophils Relative: 4 % (ref 0–5)
Lymphocytes Relative: 15 % (ref 12–46)
Lymphs Abs: 1 10*3/uL (ref 0.7–4.0)
Monocytes Absolute: 0.7 10*3/uL (ref 0.1–1.0)
Monocytes Relative: 11 % (ref 3–12)

## 2012-01-31 LAB — CBC
Hemoglobin: 12.5 g/dL — ABNORMAL LOW (ref 13.0–17.0)
MCH: 30 pg (ref 26.0–34.0)
MCHC: 33.3 g/dL (ref 30.0–36.0)
Platelets: 139 10*3/uL — ABNORMAL LOW (ref 150–400)
RBC: 4.17 MIL/uL — ABNORMAL LOW (ref 4.22–5.81)

## 2012-01-31 SURGERY — CLOSED MANIPULATION, JOINT, HIP
Anesthesia: General | Site: Hip | Laterality: Right

## 2012-01-31 MED ORDER — FENTANYL CITRATE 0.05 MG/ML IJ SOLN
INTRAMUSCULAR | Status: DC | PRN
  Administered 2012-01-31: 25 ug via INTRAVENOUS

## 2012-01-31 MED ORDER — LIDOCAINE HCL (CARDIAC) 20 MG/ML IV SOLN
INTRAVENOUS | Status: DC | PRN
  Administered 2012-01-31: 50 mg via INTRAVENOUS

## 2012-01-31 MED ORDER — ETOMIDATE 2 MG/ML IV SOLN
INTRAVENOUS | Status: DC | PRN
  Administered 2012-01-31: 10 mg via INTRAVENOUS

## 2012-01-31 MED ORDER — PROPOFOL 10 MG/ML IV BOLUS
INTRAVENOUS | Status: DC | PRN
  Administered 2012-01-31: 100 mg via INTRAVENOUS

## 2012-01-31 MED ORDER — LACTATED RINGERS IV SOLN
INTRAVENOUS | Status: DC | PRN
  Administered 2012-01-31: 19:00:00 via INTRAVENOUS

## 2012-01-31 MED ORDER — SUCCINYLCHOLINE CHLORIDE 20 MG/ML IJ SOLN
INTRAMUSCULAR | Status: DC | PRN
  Administered 2012-01-31: 100 mg via INTRAVENOUS

## 2012-01-31 MED ORDER — HYDROMORPHONE HCL PF 1 MG/ML IJ SOLN: 0.2500 mg | INTRAMUSCULAR | Status: DC | PRN

## 2012-01-31 MED ORDER — PROMETHAZINE HCL 25 MG/ML IJ SOLN: 6.2500 mg | INTRAMUSCULAR | Status: DC | PRN

## 2012-01-31 SURGICAL SUPPLY — 11 items
BANDAGE ADHESIVE 1X3 (GAUZE/BANDAGES/DRESSINGS) IMPLANT
CLOTH BEACON ORANGE TIMEOUT ST (SAFETY) ×2 IMPLANT
GOWN STRL NON-REIN LRG LVL3 (GOWN DISPOSABLE) ×2 IMPLANT
GOWN STRL REIN XL XLG (GOWN DISPOSABLE) ×2 IMPLANT
MANIFOLD NEPTUNE II (INSTRUMENTS) ×2 IMPLANT
NDL SAFETY ECLIPSE 18X1.5 (NEEDLE) IMPLANT
NEEDLE HYPO 18GX1.5 SHARP (NEEDLE)
POSITIONER SURGICAL ARM (MISCELLANEOUS) ×2 IMPLANT
SPONGE GAUZE 4X4 12PLY (GAUZE/BANDAGES/DRESSINGS) IMPLANT
SYR CONTROL 10ML LL (SYRINGE) IMPLANT
TOWEL OR 17X26 10 PK STRL BLUE (TOWEL DISPOSABLE) ×4 IMPLANT

## 2012-01-31 NOTE — H&P (Signed)
[name] is an 78 y.o. male.   Chief Complaint: right hip pain HPI: Patient bent over today working on tractor and felt a pop.  He has had multiple dislocations of right THA.  Patient will be taken to OR by Dr Lequita Halt for closed reduction.  Risks and benefits of surgery discussed with patient and they wish to proceed. Past Medical History  Diagnosis Date  . Coronary artery disease   . Myocardial infarction     Previous anterolateral myocardial infarction with presistent ST-T wave changes  . Left hip pain     Chronic left hip discomfort  . 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 Tachycardia     History of paroxysmal supraventricular tachycardia  . Hypercholesterolemia   . History of BPH   . Osteoarthritis     Past Surgical History  Procedure Date  . Cardiac catheterization 05/07/2002    Ejection fraction 20%  . Angioplasty 2002    with stent and angioplasty in 2003  . Hip surgery     Family History  Problem Relation Age of Onset  . Heart attack Father   . Stroke Father    Social History:  reports that he has never smoked. He has never used smokeless tobacco. He reports that he does not drink alcohol. His drug history not on file.  Allergies:  Allergies  Allergen Reactions  . Prednisone     REACTION: causes tachycardia    No current facility-administered medications on file as of 01/31/2012.   Medications Prior to Admission  Medication Sig Dispense Refill  . aspirin 81 MG tablet Take 81 mg by mouth daily.        . celecoxib (CELEBREX) 200 MG capsule Take 1 capsule (200 mg total) by mouth daily.  90 capsule  3  . Cholecalciferol (VITAMIN D PO) Take by mouth.        . clopidogrel (PLAVIX) 75 MG tablet Take 1 tablet (75 mg total) by mouth daily.  90 tablet  3  . donepezil (ARICEPT) 10 MG tablet Take 1 tablet (10 mg total) by mouth at bedtime as needed.  90 tablet  3  . finasteride (PROSCAR) 5 MG tablet Take 1 tablet (5 mg  total) by mouth daily.  90 tablet  3  . metoprolol (LOPRESSOR) 50 MG tablet Take 1 tablet (50 mg total) by mouth 2 (two) times daily.  180 tablet  3  . rosuvastatin (CRESTOR) 10 MG tablet Take 5 mg by mouth daily.       [name] terazosin (HYTRIN) 5 MG capsule TAKE 1 CAPSULE DAILY  90 capsule  3  . valsartan-hydrochlorothiazide (DIOVAN HCT) 160-12.5 MG per tablet Take 1 tablet by mouth daily.  90 tablet  3    Results for orders placed during the hospital encounter of 01/31/12 (from the past 48 hour(s))  CBC     Status: Abnormal   Collection Time   01/31/12  4:15 PM      Component Value Range Comment   WBC 7.0  4.0 - 10.5 (K/uL)    RBC 4.17 (*) 4.22 - 5.81 (MIL/uL)    Hemoglobin 12.5 (*) 13.0 - 17.0 (g/dL)    HCT 04.5 (*) 40.9 - 52.0 (%)    MCV 89.9  78.0 - 100.0 (fL)    MCH 30.0  26.0 - 34.0 (pg)    MCHC 33.3  30.0 - 36.0 (g/dL)    RDW 81.1  91.4 - 78.2 (%)  Platelets 139 (*) 150 - 400 (K/uL)   DIFFERENTIAL     Status: Normal   Collection Time   01/31/12  4:15 PM      Component Value Range Comment   Neutrophils Relative 71  43 - 77 (%)    Neutro Abs 4.9  1.7 - 7.7 (K/uL)    Lymphocytes Relative 15  12 - 46 (%)    Lymphs Abs 1.0  0.7 - 4.0 (K/uL)    Monocytes Relative 11  3 - 12 (%)    Monocytes Absolute 0.7  0.1 - 1.0 (K/uL)    Eosinophils Relative 4  0 - 5 (%)    Eosinophils Absolute 0.3  0.0 - 0.7 (K/uL)    Basophils Relative 1  0 - 1 (%)    Basophils Absolute 0.0  0.0 - 0.1 (K/uL)   BASIC METABOLIC PANEL     Status: Abnormal   Collection Time   01/31/12  4:15 PM      Component Value Range Comment   Sodium 139  135 - 145 (mEq/L)    Potassium 4.9  3.5 - 5.1 (mEq/L)    Chloride 105  96 - 112 (mEq/L)    CO2 29  19 - 32 (mEq/L)    Glucose, Bld 119 (*) 70 - 99 (mg/dL)    BUN 30 (*) 6 - 23 (mg/dL)    Creatinine, Ser 1.61 (*) 0.50 - 1.35 (mg/dL)    Calcium 9.6  8.4 - 10.5 (mg/dL)    GFR calc non Af Amer 38 (*) >90 (mL/min)    GFR calc Af Amer 44 (*) >90 (mL/min)   PROTIME-INR      Status: Normal   Collection Time   01/31/12  4:15 PM      Component Value Range Comment   Prothrombin Time 14.0  11.6 - 15.2 (seconds) RESULT CHECKED   INR 1.06  0.00 - 1.49     Dg Chest 1 View  01/31/2012  *RADIOLOGY REPORT*  Clinical Data: Hypertension, right hip dislocation  CHEST - 1 VIEW  Comparison: 04/23/2011  Findings: Enlargement of cardiac silhouette. Atherosclerotic calcification aorta. Slight pulmonary vascular congestion. No acute infiltrate, pleural effusion or pneumothorax. No acute osseous findings.  IMPRESSION: Enlargement of cardiac silhouette with mild pulmonary vascular congestion. No acute abnormalities.  Original Report Authenticated By: Lollie Marrow, M.D.   Dg Hip Complete Right  01/31/2012  *RADIOLOGY REPORT*  Clinical Data: Right hip dislocation  RIGHT HIP - COMPLETE 2+ VIEW  Comparison: 01/25/2011  Findings: Bilateral hip prostheses. Superior dislocation of right hip prosthesis. Left hip prosthesis appears normally located. No periprosthetic lucency. No acute fracture or bone destruction identified. Scattered pelvic phleboliths.  IMPRESSION: Superior dislocation of right hip prosthesis. Normally located left hip prosthesis. Osseous demineralization.  Original Report Authenticated By: Lollie Marrow, M.D.    Review of Systems: The patient denies any fever, chills, night sweats, or bleeding tendencies. CNS: No blurred double vision, seizure, headache, paralysis. RESPIRATORY: No shortness of breath, productive cough, or hemoptysis. CARDIOVASCULAR: No chest pain, angina, or orthopnea, GU: No dysuria, hematuria, or discharge. MUSCULOSKELETAL: Pertinent per HPI.  Blood pressure 175/84, pulse 45, resp. rate 20, SpO2 98.00%. Extremities: positive deformity to right LE NV intact right LE.  Assessment/Plan To OR for closed reduction right hip by Dr Lequita Halt  STRADER,CHRISTINE R. 01/31/2012, 6:00 PM  Gus Rankin. Bexley Laubach, MD    01/31/2012, 6:19 PM

## 2012-01-31 NOTE — ED Provider Notes (Addendum)
History     CSN: [id number]  Arrival date & time 01/31/12  1403   First MD Initiated Contact with Patient 01/31/12 1459      Chief Complaint  Patient presents with  . Dislocation    right hip    (Consider location/radiation/quality/duration/timing/severity/associated sxs/prior treatment) HPI Patient presents to the ED complaints of right hip dislocation.  He had a hip replacement surgery approximately 2-3 years ago. He states that since that time he's had 5 prior dislocations of that hip. Patient was bending down working on his tractor when he felt his hip pop out. Patient complains of pain in the right hip area. He is unable to move the hip because of the discomfort. He denies any other injuries. Patient did not lose consciousness Past Medical History  Diagnosis Date  . Coronary artery disease   . Myocardial infarction     Previous anterolateral myocardial infarction with presistent ST-T wave changes  . Left hip pain     Chronic left hip discomfort  . 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 Tachycardia     History of paroxysmal supraventricular tachycardia  . Hypercholesterolemia   . History of BPH   . Osteoarthritis     Past Surgical History  Procedure Date  . Cardiac catheterization 05/07/2002    Ejection fraction 20%  . Angioplasty 2002    with stent and angioplasty in 2003  . Hip surgery     Family History  Problem Relation Age of Onset  . Heart attack Father   . Stroke Father     History  Substance Use Topics  . Smoking status: Never Smoker   . Smokeless tobacco: Not on file  . Alcohol Use: No      Review of Systems  All other systems reviewed and are negative.    Allergies  Prednisone  Home Medications   Current Outpatient Rx  Name Route Sig Dispense Refill  . ASPIRIN 81 MG PO TABS Oral Take 81 mg by mouth daily.      . CELECOXIB 200 MG PO CAPS Oral Take 1 capsule (200 mg total) by mouth daily. 90  capsule 3  . VITAMIN D PO Oral Take by mouth.      . CLOPIDOGREL BISULFATE 75 MG PO TABS Oral Take 1 tablet (75 mg total) by mouth daily. 90 tablet 3  . DONEPEZIL HCL 10 MG PO TABS Oral Take 1 tablet (10 mg total) by mouth at bedtime as needed. 90 tablet 3  . FINASTERIDE 5 MG PO TABS Oral Take 1 tablet (5 mg total) by mouth daily. 90 tablet 3  . METOPROLOL TARTRATE 50 MG PO TABS Oral Take 1 tablet (50 mg total) by mouth 2 (two) times daily. 180 tablet 3  . ROSUVASTATIN CALCIUM 10 MG PO TABS Oral Take 5 mg by mouth daily.     [name] TERAZOSIN HCL 5 MG PO CAPS  TAKE 1 CAPSULE DAILY 90 capsule 3  . VALSARTAN-HYDROCHLOROTHIAZIDE 160-12.5 MG PO TABS Oral Take 1 tablet by mouth daily. 90 tablet 3    There were no vitals taken for this visit.  Physical Exam  Nursing note and vitals reviewed. Constitutional: He appears well-developed and well-nourished. No distress.  HENT:  Head: Normocephalic and atraumatic.  Right Ear: External ear normal.  Left Ear: External ear normal.  Eyes: Conjunctivae are normal. Right eye exhibits no discharge. Left eye exhibits no discharge. No scleral icterus.  Neck: Neck supple. No  tracheal deviation present.  Cardiovascular: Normal rate, regular rhythm and intact distal pulses.   Pulmonary/Chest: Effort normal and breath sounds normal. No stridor. No respiratory distress. He has no wheezes. He has no rales.  Abdominal: Soft. Bowel sounds are normal. He exhibits no distension. There is no tenderness. There is no rebound and no guarding.  Musculoskeletal: He exhibits tenderness. He exhibits no edema.       Right hip: He exhibits decreased range of motion, tenderness, bony tenderness and deformity. He exhibits normal strength, no swelling and no crepitus.  Neurological: He is alert. He has normal strength. No sensory deficit. Cranial nerve deficit:  no gross defecits noted. He exhibits normal muscle tone. He displays no seizure activity. Coordination normal.  Skin: Skin is  warm and dry. No rash noted.  Psychiatric: He has a normal mood and affect.    ED Course  Procedures (including critical care time)  Labs Reviewed  CBC - Abnormal; Notable for the following:    RBC 4.17 (*)    Hemoglobin 12.5 (*)    HCT 37.5 (*)    Platelets 139 (*)    All other components within normal limits  BASIC METABOLIC PANEL - Abnormal; Notable for the following:    Glucose, Bld 119 (*)    BUN 30 (*)    Creatinine, Ser 1.59 (*)    GFR calc non Af Amer 38 (*)    GFR calc Af Amer 44 (*)    All other components within normal limits  DIFFERENTIAL  PROTIME-INR   Dg Chest 1 View  01/31/2012  *RADIOLOGY REPORT*  Clinical Data: Hypertension, right hip dislocation  CHEST - 1 VIEW  Comparison: 04/23/2011  Findings: Enlargement of cardiac silhouette. Atherosclerotic calcification aorta. Slight pulmonary vascular congestion. No acute infiltrate, pleural effusion or pneumothorax. No acute osseous findings.  IMPRESSION: Enlargement of cardiac silhouette with mild pulmonary vascular congestion. No acute abnormalities.  Original Report Authenticated By: Lollie Marrow, M.D.   Dg Hip Complete Right  01/31/2012  *RADIOLOGY REPORT*  Clinical Data: Right hip dislocation  RIGHT HIP - COMPLETE 2+ VIEW  Comparison: 01/25/2011  Findings: Bilateral hip prostheses. Superior dislocation of right hip prosthesis. Left hip prosthesis appears normally located. No periprosthetic lucency. No acute fracture or bone destruction identified. Scattered pelvic phleboliths.  IMPRESSION: Superior dislocation of right hip prosthesis. Normally located left hip prosthesis. Osseous demineralization.  Original Report Authenticated By: Lollie Marrow, M.D.     Date: 01/31/2012  Rate: 47  Rhythm: sinus bradycardia  QRS Axis: normal  Intervals: normal  ST/T Wave abnormalities: ST elevations anteriorly inverted lateral T waves Conduction Disutrbances:none  Narrative Interpretation:   Old EKG Reviewed:  unchanged     MDM  Patient does have a recurrent dislocation of his right hip. In the past he states he has gone to the operating room for reduction. I will consult the orthopedic surgeon on call for Dr. Despina Hick.  Medical clearance labs for the OR have been ordered.      Celene Kras, MD 01/31/12 1626  Celene Kras, MD 01/31/12 [phone]

## 2012-01-31 NOTE — Transfer of Care (Signed)
Immediate Anesthesia Transfer of Care Note  Patient: [name]  Procedure(s) Performed: Procedure(s) (LRB): CLOSED MANIPULATION HIP (Right)  Patient Location: PACU  Anesthesia Type: General  Level of Consciousness: sedated  Airway & Oxygen Therapy: Patient Spontanous Breathing and Patient connected to face mask oxygen  Post-op Assessment: Report given to PACU RN and Post -op Vital signs reviewed and stable  Post vital signs: Reviewed and stable  Complications: No apparent anesthesia complications

## 2012-01-31 NOTE — Anesthesia Procedure Notes (Signed)
Procedure Name: Intubation Date/Time: 01/31/2012 7:06 PM Performed by: Joycie Peek Pre-anesthesia Checklist: Patient identified, Timeout performed, Emergency Drugs available, Suction available and Patient being monitored Patient Re-evaluated:Patient Re-evaluated prior to inductionOxygen Delivery Method: Circle System Utilized Preoxygenation: Pre-oxygenation with 100% oxygen Intubation Type: IV induction, Rapid sequence and Cricoid Pressure applied Laryngoscope Size: Mac and 4 Grade View: Grade II Tube type: Oral Tube size: 7.5 mm Number of attempts: 1 Airway Equipment and Method: stylet Placement Confirmation: ETT inserted through vocal cords under direct vision,  breath sounds checked- equal and bilateral and positive ETCO2 Secured at: 22 cm Tube secured with: Tape Dental Injury: Teeth and Oropharynx as per pre-operative assessment

## 2012-01-31 NOTE — Brief Op Note (Signed)
01/31/2012  7:13 PM  PATIENT:  [name]  78 y.o. male  PRE-OPERATIVE DIAGNOSIS:  dislocated right hip  POST-OPERATIVE DIAGNOSIS:  dislocated right hip  PROCEDURE:  Procedure(s) (LRB): Closed reduction right hip dislocation  SURGEON:  Surgeon(s) and Role:    * Loanne Drilling, MD - Primary  ASSISTANTS: none   ANESTHESIA:   general  DICTATION: .Other Dictation: Dictation Number [id number]  PLAN OF CARE: Discharge to home after PACU  PATIENT DISPOSITION:  PACU - hemodynamically stable.   Gus Rankin Toneisha Savary, MD    01/31/2012, 7:16 PM

## 2012-01-31 NOTE — Interval H&P Note (Signed)
History and Physical Interval Note:  01/31/2012 6:19 PM  [name]  has presented today for surgery, with the diagnosis of dislocated right hip  The various methods of treatment have been discussed with the patient and family. After consideration of risks, benefits and other options for treatment, the patient has consented to  Procedure(s) (LRB): CLOSED MANIPULATION HIP (Right) as a surgical intervention .  The patients' history has been reviewed, patient examined, no change in status, stable for surgery.  I have reviewed the patients' chart and labs.  Questions were answered to the patient's satisfaction.     Loanne Drilling

## 2012-01-31 NOTE — ED Provider Notes (Signed)
History     CSN: [id number]  Arrival date & time 01/31/12  1403   First MD Initiated Contact with Patient 01/31/12 1459      Chief Complaint  Patient presents with  . Dislocation    right hip    (Consider location/radiation/quality/duration/timing/severity/associated sxs/prior treatment) HPI  Past Medical History  Diagnosis Date  . Coronary artery disease   . Myocardial infarction     Previous anterolateral myocardial infarction with presistent ST-T wave changes  . Left hip pain     Chronic left hip discomfort  . 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 Tachycardia     History of paroxysmal supraventricular tachycardia  . Hypercholesterolemia   . History of BPH   . Osteoarthritis     Past Surgical History  Procedure Date  . Cardiac catheterization 05/07/2002    Ejection fraction 20%  . Angioplasty 2002    with stent and angioplasty in 2003  . Hip surgery     Family History  Problem Relation Age of Onset  . Heart attack Father   . Stroke Father     History  Substance Use Topics  . Smoking status: Never Smoker   . Smokeless tobacco: Not on file  . Alcohol Use: No      Review of Systems  Allergies  Prednisone  Home Medications   Current Outpatient Rx  Name Route Sig Dispense Refill  . ASPIRIN 81 MG PO TABS Oral Take 81 mg by mouth daily.      [name] CALCIUM PO Oral Take by mouth.      . CELECOXIB 200 MG PO CAPS Oral Take 1 capsule (200 mg total) by mouth daily. 90 capsule 3  . VITAMIN D PO Oral Take by mouth.      . CLOPIDOGREL BISULFATE 75 MG PO TABS Oral Take 1 tablet (75 mg total) by mouth daily. 90 tablet 3  . DONEPEZIL HCL 10 MG PO TABS Oral Take 1 tablet (10 mg total) by mouth at bedtime as needed. 90 tablet 3  . PEPCID PO Oral Take by mouth. As needed    . FINASTERIDE 5 MG PO TABS Oral Take 1 tablet (5 mg total) by mouth daily. 90 tablet 3  . METOPROLOL TARTRATE 50 MG PO TABS Oral Take 1 tablet (50 mg  total) by mouth 2 (two) times daily. 180 tablet 3  . MULTI-VITAMIN PO Oral Take by mouth.      . ROSUVASTATIN CALCIUM 10 MG PO TABS Oral Take 5 mg by mouth daily.     [name] TERAZOSIN HCL 5 MG PO CAPS  TAKE 1 CAPSULE DAILY 90 capsule 3  . VALSARTAN-HYDROCHLOROTHIAZIDE 160-12.5 MG PO TABS Oral Take 1 tablet by mouth daily. 90 tablet 3  . VITAMIN A PO Oral Take by mouth.        There were no vitals taken for this visit.  Physical Exam  ED Course  Procedures (including critical care time)  Labs Reviewed - No data to display No results found.   No diagnosis found.    MDM  Not my patient. Not seen by me        Doug Sou, MD 02/01/12 [phone]

## 2012-01-31 NOTE — ED Notes (Signed)
[id number]> Expected date:<BR> Expected time:<BR> Means of arrival:<BR> Comments:<BR> Ems/ dislocation of hip

## 2012-01-31 NOTE — ED Notes (Signed)
Pt brought by ems from home s/p injury to right hip. Pt was working on his tractor and when he twisted his body he felt it pop. Pt has had this happen before.

## 2012-01-31 NOTE — Anesthesia Preprocedure Evaluation (Signed)
Anesthesia Evaluation  Patient identified by MRN, date of birth, ID band Patient awake  General Assessment Comment:Ate lunch 11:00  Reviewed: Allergy & Precautions, H&P , NPO status , Patient's Chart, lab work & pertinent test results, reviewed documented beta blocker date and time   Airway Mallampati: II TM Distance: >3 FB Neck ROM: Full    Dental  (+) Teeth Intact and Dental Advisory Given   Pulmonary neg pulmonary ROS,  clear to auscultation        Cardiovascular hypertension, Pt. on medications + CAD and + Past MI Regular Normal Cad, s/p PTca w/ stent 2002 Currently asymptomatic   Neuro/Psych Some memory deficits Negative Psych ROS   GI/Hepatic negative GI ROS, Neg liver ROS,   Endo/Other  Negative Endocrine ROS  Renal/GU Cr 1.59  Genitourinary negative   Musculoskeletal negative musculoskeletal ROS (+)   Abdominal   Peds negative pediatric ROS (+)  Hematology negative hematology ROS (+)   Anesthesia Other Findings   Reproductive/Obstetrics negative OB ROS                           Anesthesia Physical Anesthesia Plan  ASA: III and Emergent  Anesthesia Plan: General   Post-op Pain Management:    Induction: Intravenous, Rapid sequence and Cricoid pressure planned  Airway Management Planned: Oral ETT  Additional Equipment:   Intra-op Plan:   Post-operative Plan: Extubation in OR  Informed Consent: I have reviewed the patients History and Physical, chart, labs and discussed the procedure including the risks, benefits and alternatives for the proposed anesthesia with the patient or authorized representative who has indicated his/her understanding and acceptance.     Plan Discussed with: CRNA and Surgeon  Anesthesia Plan Comments:         Anesthesia Quick Evaluation

## 2012-02-01 NOTE — Op Note (Signed)
NAMEKRISTJAN, DERNER NO.:  [id number]  MEDICAL RECORD NO.:  [id number]  LOCATION:  [facility]                         FACILITY:  [facility]  PHYSICIAN:  Ollen Gross, M.D.    DATE OF BIRTH:  1925/02/21  DATE OF PROCEDURE:  01/31/2012 DATE OF DISCHARGE:  01/31/2012                              OPERATIVE REPORT   PREOPERATIVE DIAGNOSIS:  Dislocated right total hip arthroplasty.  POSTOPERATIVE DIAGNOSIS:  Dislocated right total hip arthroplasty.  PROCEDURE:  Closed reduction of right hip.  SURGEON:  Ollen Gross, M.D.  ASSISTANT:  No assistant.  ANESTHESIA:  General.  ESTIMATED BLOOD LOSS:  None.  COMPLICATIONS:  None.  CONDITION:  Stable to Recovery.  BRIEF CLINICAL NOTE:  Mr. Louvier is an 78 year old male who had a right total hip arthroplasty done several years ago.  He has had few prior dislocations.  Today, he was working on his tractor, bent down to get something and dislocated the hip again.  He was noted to have a posterior superior dislocation in the x-ray in the emergency room.  He was taken to the operating room now for closed reduction.  PROCEDURE IN DETAIL:  After successful administration of general anesthetic, a time-out was performed and his hip was placed into 90:90 position and traction applied.  Audible reduction was noted.  I was then able to flex the hip to 90, rotated about 30 in each direction and abduct 40 without any instability.  He had full extension, full external rotation, also.  Radiograph of AP pelvis confirmed concentric reduction of the hip.  He was placed into a knee immobilizer, awakened, and transported to Recovery in stable condition.     Ollen Gross, M.D.    FA/MEDQ  D:  01/31/2012  T:  02/01/2012  Job:  [id number]

## 2012-02-01 NOTE — Anesthesia Postprocedure Evaluation (Signed)
  Anesthesia Post-op Note  Patient: [name]  Procedure(s) Performed: Procedure(s) (LRB): CLOSED MANIPULATION HIP (Right)  Patient Location: PACU  Anesthesia Type: General  Level of Consciousness: oriented and sedated  Airway and Oxygen Therapy: Patient Spontanous Breathing  Post-op Pain: mild  Post-op Assessment: Post-op Vital signs reviewed, Patient's Cardiovascular Status Stable, Respiratory Function Stable and Patent Airway  Post-op Vital Signs: stable  Complications: No apparent anesthesia complications

## 2012-02-11 ENCOUNTER — Ambulatory Visit (INDEPENDENT_AMBULATORY_CARE_PROVIDER_SITE_OTHER): Payer: Medicare Other | Admitting: Cardiology

## 2012-02-11 ENCOUNTER — Encounter: Payer: Self-pay | Admitting: Cardiology

## 2012-02-11 VITALS — BP 158/80 | HR 52 | Ht 68.0 in | Wt 173.0 lb

## 2012-02-11 DIAGNOSIS — Z8679 Personal history of other diseases of the circulatory system: Secondary | ICD-10-CM

## 2012-02-11 DIAGNOSIS — E7800 Pure hypercholesterolemia, unspecified: Secondary | ICD-10-CM

## 2012-02-11 DIAGNOSIS — I2510 Atherosclerotic heart disease of native coronary artery without angina pectoris: Secondary | ICD-10-CM

## 2012-02-11 DIAGNOSIS — I219 Acute myocardial infarction, unspecified: Secondary | ICD-10-CM

## 2012-02-11 DIAGNOSIS — S73006A Unspecified dislocation of unspecified hip, initial encounter: Secondary | ICD-10-CM

## 2012-02-11 LAB — HEPATIC FUNCTION PANEL
Alkaline Phosphatase: 93 U/L (ref 39–117)
Bilirubin, Direct: 0.1 mg/dL (ref 0.0–0.3)
Total Bilirubin: 1 mg/dL (ref 0.3–1.2)

## 2012-02-11 LAB — BASIC METABOLIC PANEL
BUN: 30 mg/dL — ABNORMAL HIGH (ref 6–23)
Chloride: 105 mEq/L (ref 96–112)
Creatinine, Ser: 1.3 mg/dL (ref 0.4–1.5)
Glucose, Bld: 93 mg/dL (ref 70–99)
Potassium: 4.5 mEq/L (ref 3.5–5.1)

## 2012-02-11 LAB — LIPID PANEL
Cholesterol: 143 mg/dL (ref 0–200)
LDL Cholesterol: 72 mg/dL (ref 0–99)
VLDL: 12.8 mg/dL (ref 0.0–40.0)

## 2012-02-11 NOTE — Assessment & Plan Note (Signed)
The patient had another dislocation of his prosthetic hip joint recently.  He was trying to fix a heavy lawnmower and had his leg propped unusual angle when the hip popped out of joint.  He had to go to the emergency room where he was given conscious sedation anesthesia and his orthopedist at the hip joint back in place.

## 2012-02-11 NOTE — Progress Notes (Signed)
[name] Date of Birth:  1925-10-23 [facility] [address] [place], [place]  [address] [phone]         Fax   [phone]  History of Present Illness: This pleasant 78 year old gentleman is seen for a followup office visit.  He has a history of ischemic heart disease.  He had angioplasty and stent in 2002.  He had another angioplasty in 2003.  In November 2009 he had a nuclear stress test which showed no reversible ischemia.  On 02/28/11 he had another nuclear stress test as preop clearance for a hip revision procedure and there was no evidence of ischemia but there was an old scar present.  Patient has a past history of paroxysmal supraventricular tachycardia.  As a history of osteoarthritis.  He has had dyslipidemia.  Current Outpatient Prescriptions  Medication Sig Dispense Refill  . aspirin 81 MG tablet Take 81 mg by mouth daily.        . clopidogrel (PLAVIX) 75 MG tablet Take 1 tablet (75 mg total) by mouth daily.  90 tablet  3  . finasteride (PROSCAR) 5 MG tablet Take 1 tablet (5 mg total) by mouth daily.  90 tablet  3  . metoprolol (LOPRESSOR) 50 MG tablet Take 1 tablet (50 mg total) by mouth 2 (two) times daily.  180 tablet  3  . rosuvastatin (CRESTOR) 10 MG tablet Take 5 mg by mouth daily.       [name] terazosin (HYTRIN) 5 MG capsule TAKE 1 CAPSULE DAILY  90 capsule  3  . valsartan-hydrochlorothiazide (DIOVAN HCT) 160-12.5 MG per tablet Take 1 tablet by mouth daily.  90 tablet  3  . celecoxib (CELEBREX) 200 MG capsule Take 1 capsule (200 mg total) by mouth daily.  90 capsule  3  . Cholecalciferol (VITAMIN D PO) Take by mouth.        . donepezil (ARICEPT) 10 MG tablet Take 1 tablet (10 mg total) by mouth at bedtime as needed.  90 tablet  3    Allergies  Allergen Reactions  . Prednisone     REACTION: causes tachycardia    Patient Active Problem List  Diagnoses  . TUBULOVILLOUS ADENOMA, COLON  . HYPERLIPIDEMIA  . HYPERTENSION  . MYOCARDIAL  INFARCTION  . CORONARY ARTERY DISEASE  . GERD  . DIVERTICULOSIS, COLON  . DEGENERATIVE JOINT DISEASE, CERVICAL SPINE  . TRIGGER FINGER  . COUGH  . HYPERGLYCEMIA  . CARPAL TUNNEL SYNDROME, HX OF  . SUPRAVENTRICULAR TACHYCARDIA, HX OF  . NEPHROLITHIASIS, HX OF  . BENIGN PROSTATIC HYPERTROPHY, HX OF  . Unspecified Personal History Presenting Hazards to Health  . APPENDECTOMY, HX OF  . PERCUTANEOUS TRANSLUMINAL CORONARY ANGIOPLASTY, HX OF  . CARPAL TUNNEL RELEASE, RIGHT, HX OF  . Osteoarthritis  . Hip dislocation    History  Smoking status  . Never Smoker   Smokeless tobacco  . Never Used    History  Alcohol Use No    Family History  Problem Relation Age of Onset  . Heart attack Father   . Stroke Father     Review of Systems: Constitutional: no fever chills diaphoresis or fatigue or change in weight.  Head and neck: no hearing loss, no epistaxis, no photophobia or visual disturbance. Respiratory: No cough, shortness of breath or wheezing. Cardiovascular: No chest pain peripheral edema, palpitations. Gastrointestinal: No abdominal distention, no abdominal pain, no change in bowel habits hematochezia or melena. Genitourinary: No dysuria, no frequency, no urgency, no nocturia. Musculoskeletal:No  arthralgias, no back pain, no gait disturbance or myalgias. Neurological: No dizziness, no headaches, no numbness, no seizures, no syncope, no weakness, no tremors. Hematologic: No lymphadenopathy, no easy bruising. Psychiatric: No confusion, no hallucinations, no sleep disturbance.    Physical Exam: Filed Vitals:   02/11/12 0828  BP: 158/80  Pulse: 52   the general appearance reveals a well-developed well-nourished gentleman in no distress.Pupils equal and reactive.   Extraocular Movements are full.  There is no scleral icterus.  The mouth and pharynx are normal.  The neck is supple.  The carotids reveal no bruits.  The jugular venous pressure is normal.  The thyroid is not  enlarged.  There is no lymphadenopathy.  The chest is clear to percussion and auscultation. There are no rales or rhonchi. Expansion of the chest is symmetrical.  The precordium is quiet.  The first heart sound is normal.  The second heart sound is physiologically split.  There is no murmur gallop rub or click.  There is no abnormal lift or heave.  The abdomen is soft and nontender. Bowel sounds are normal. The liver and spleen are not enlarged. There Are no abdominal masses. There are no bruits.  The pedal pulses are good.  There is no phlebitis or edema.  There is no cyanosis or clubbing. Strength is normal and symmetrical in all extremities.  There is no lateralizing weakness.  There are no sensory deficits.  The skin is warm and dry.  There is no rash.    Assessment / Plan: Continue on same medication.  Recheck in 6 months for a followup office visit EKG and fasting lab work.  Blood work today is pending.  He will work hard to get the extra 8 pounds off before next visit

## 2012-02-11 NOTE — Patient Instructions (Signed)
Will obtain labs today and call you with the results (lp/bmet/hfp) Your physician recommends that you continue on your current medications as directed. Please refer to the Current Medication list given to you today. Your physician wants you to follow-up in: 6 months You will receive a reminder letter in the mail two months in advance. If you don't receive a letter, please call our office to schedule the follow-up appointment.     

## 2012-02-11 NOTE — Assessment & Plan Note (Signed)
Patient has not been experiencing any chest pain or angina pectoris.  He has been more short of breath which she attributes to having gained 8 pounds over the winter.

## 2012-02-11 NOTE — Assessment & Plan Note (Signed)
The patient has had no recurrent episodes of SVT.  If he does have an episode he can stop it by drinking a glass of cold water or drinking cold soda.

## 2012-02-12 ENCOUNTER — Telehealth: Payer: Self-pay | Admitting: *Deleted

## 2012-02-12 NOTE — Telephone Encounter (Signed)
Message copied by Burnell Blanks on Wed Feb 12, 2012 10:27 AM ------      Message from: Cassell Clement      Created: Wed Feb 12, 2012  6:09 AM       Please report to patient.  The recent labs are stable. Continue same medication and careful diet.

## 2012-02-12 NOTE — Telephone Encounter (Signed)
Mailed copy of labs and left message to call if any questions  

## 2012-02-12 NOTE — Progress Notes (Signed)
Quick Note:  Please report to patient. The recent labs are stable. Continue same medication and careful diet. ______ 

## 2012-02-17 ENCOUNTER — Encounter (HOSPITAL_COMMUNITY): Payer: Self-pay | Admitting: Orthopedic Surgery

## 2012-02-26 ENCOUNTER — Telehealth: Payer: Self-pay | Admitting: Cardiology

## 2012-02-26 NOTE — Telephone Encounter (Signed)
Please return call to patient wife Pryor Curia at hm# [phone]  Patient calling concerning crestor samples, wife can be reached at hm# [phone]

## 2012-02-26 NOTE — Telephone Encounter (Signed)
Samples gathered crestor 10 mg [id number] ex 9/15   No answer at home or cell

## 2012-02-27 NOTE — Telephone Encounter (Signed)
F/U  Patient calling to fu on crestor sample request.  Patient can be reached at [phone].

## 2012-02-27 NOTE — Telephone Encounter (Signed)
Advised wife  

## 2012-03-20 IMAGING — CR DG HIP (WITH OR WITHOUT PELVIS) 2-3V*L*
3 series · 3 of 3 positions shown · non-contrast
Comparison: None

CLINICAL DATA: Osteolysis.  Preop revision.

LEFT HIP - COMPLETE 2+ VIEW

[t pelvis a.p.]
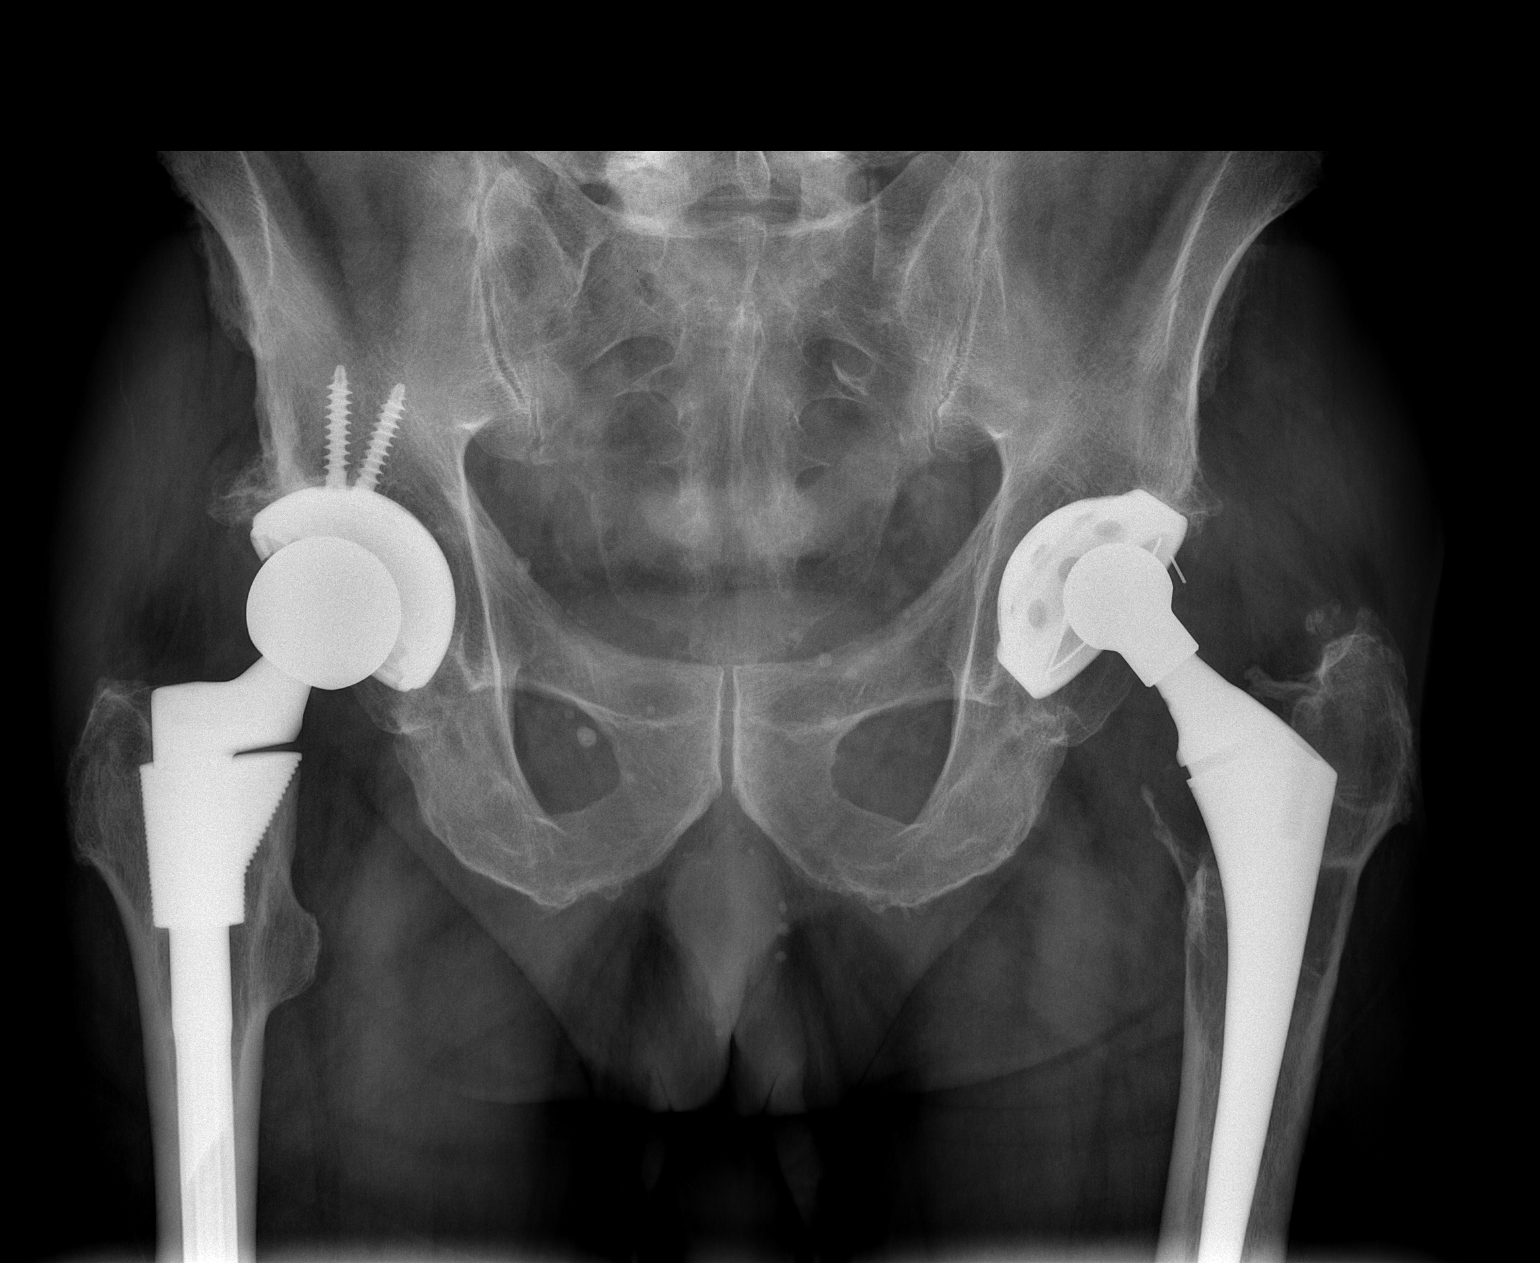

[t hip ap left]
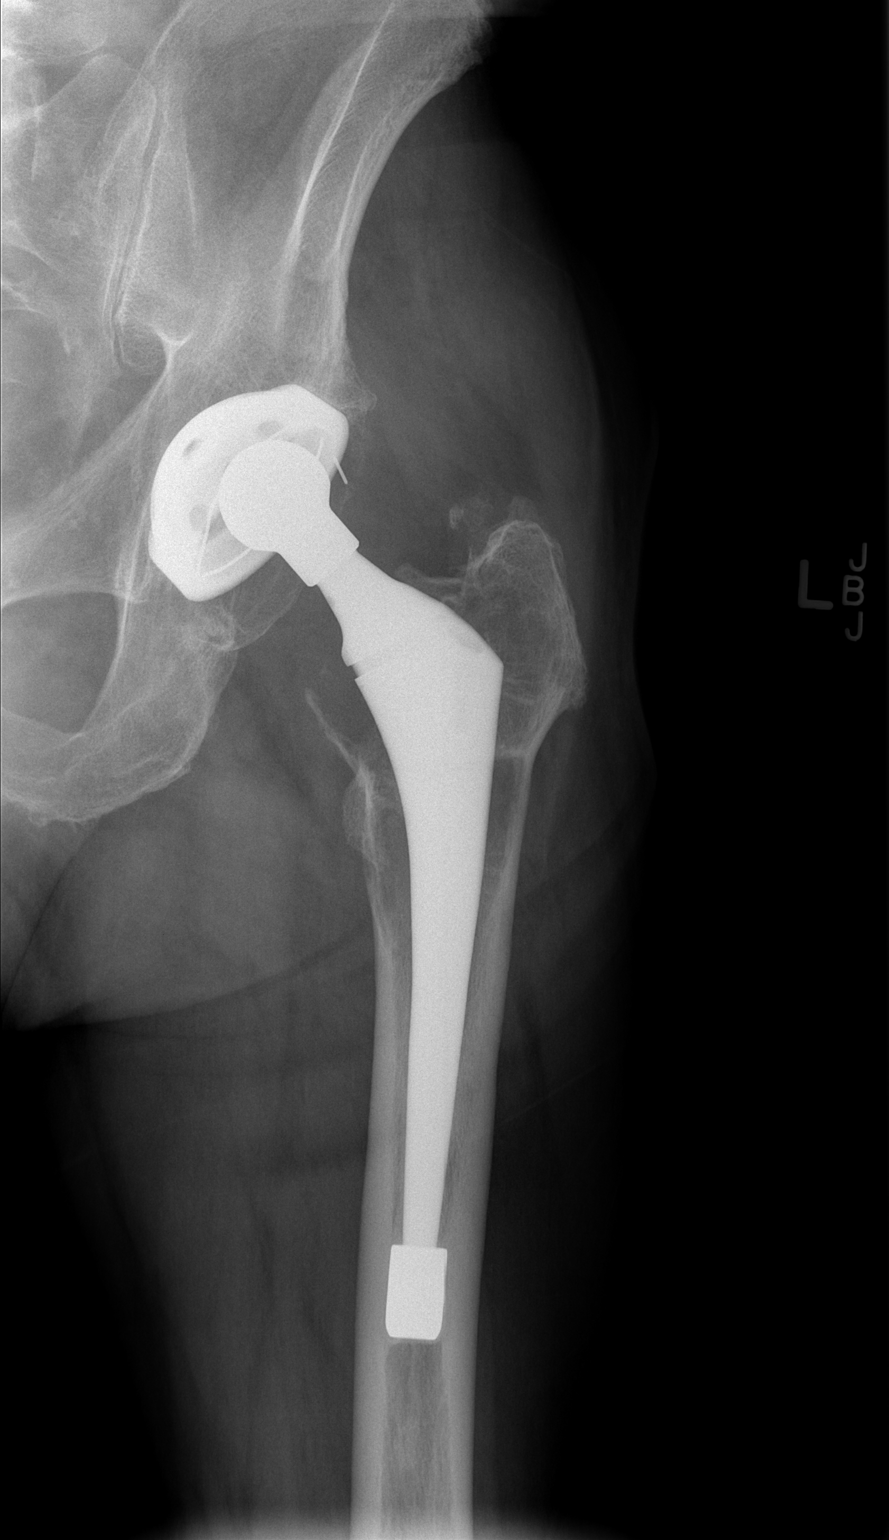

[t hip frog leg left]
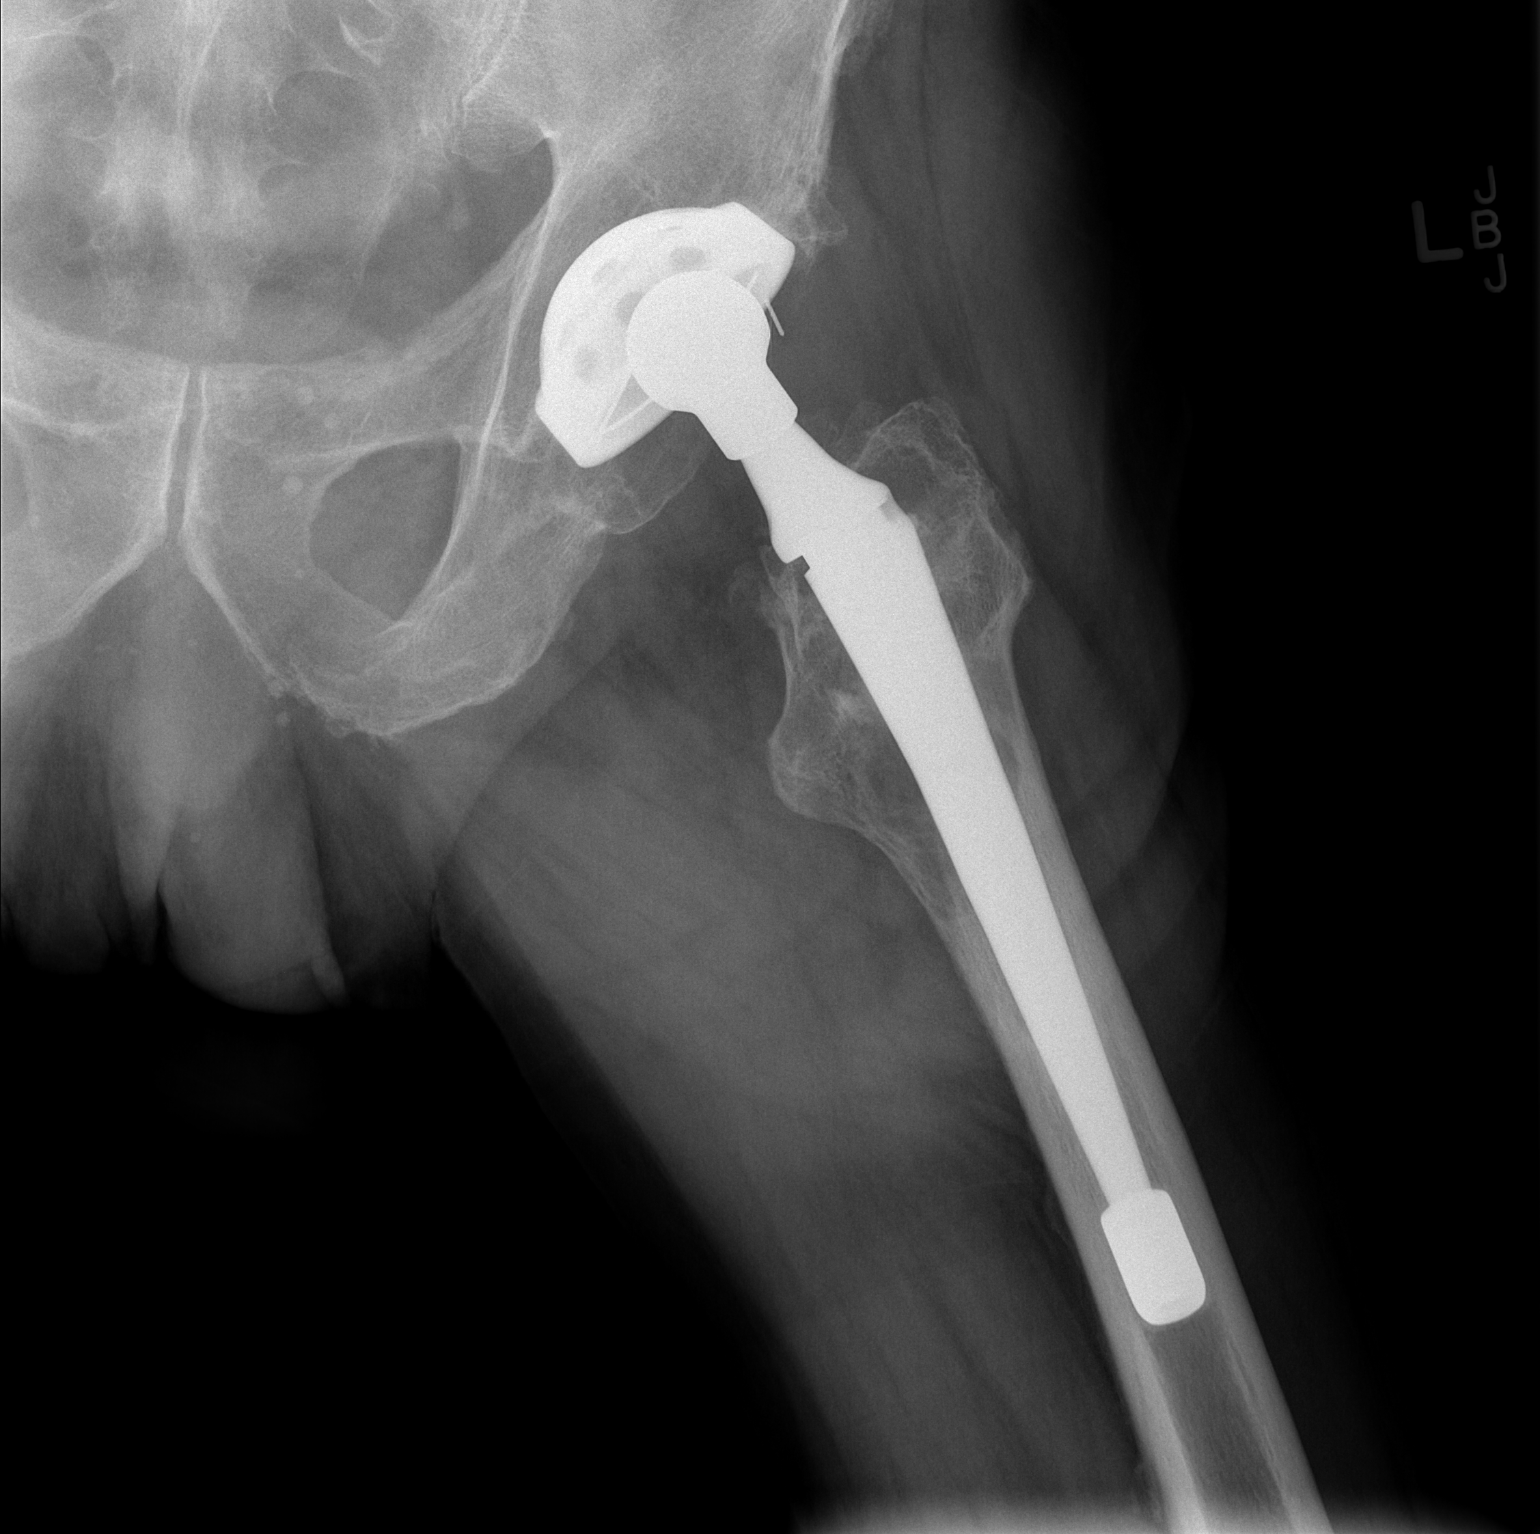

[3 of 3 positions shown; findings below may reference images not displayed]

FINDINGS: Bilateral hip replacements are noted.  Lucency noted
around the proximal femoral shaft component in the
intertrochanteric region, presumably loosening.  No fracture.
IMPRESSION: Lucency around the proximal femoral stem of the left hip
replacement.  Compatible with loosening.

## 2012-03-28 IMAGING — CR DG HIP 1V PORT*L*
1 series · 1 of 1 positions shown · non-contrast
Comparison: 04/23/2011

CLINICAL DATA: Postop left hip

PORTABLE LEFT HIP - 1 VIEW

[AP]
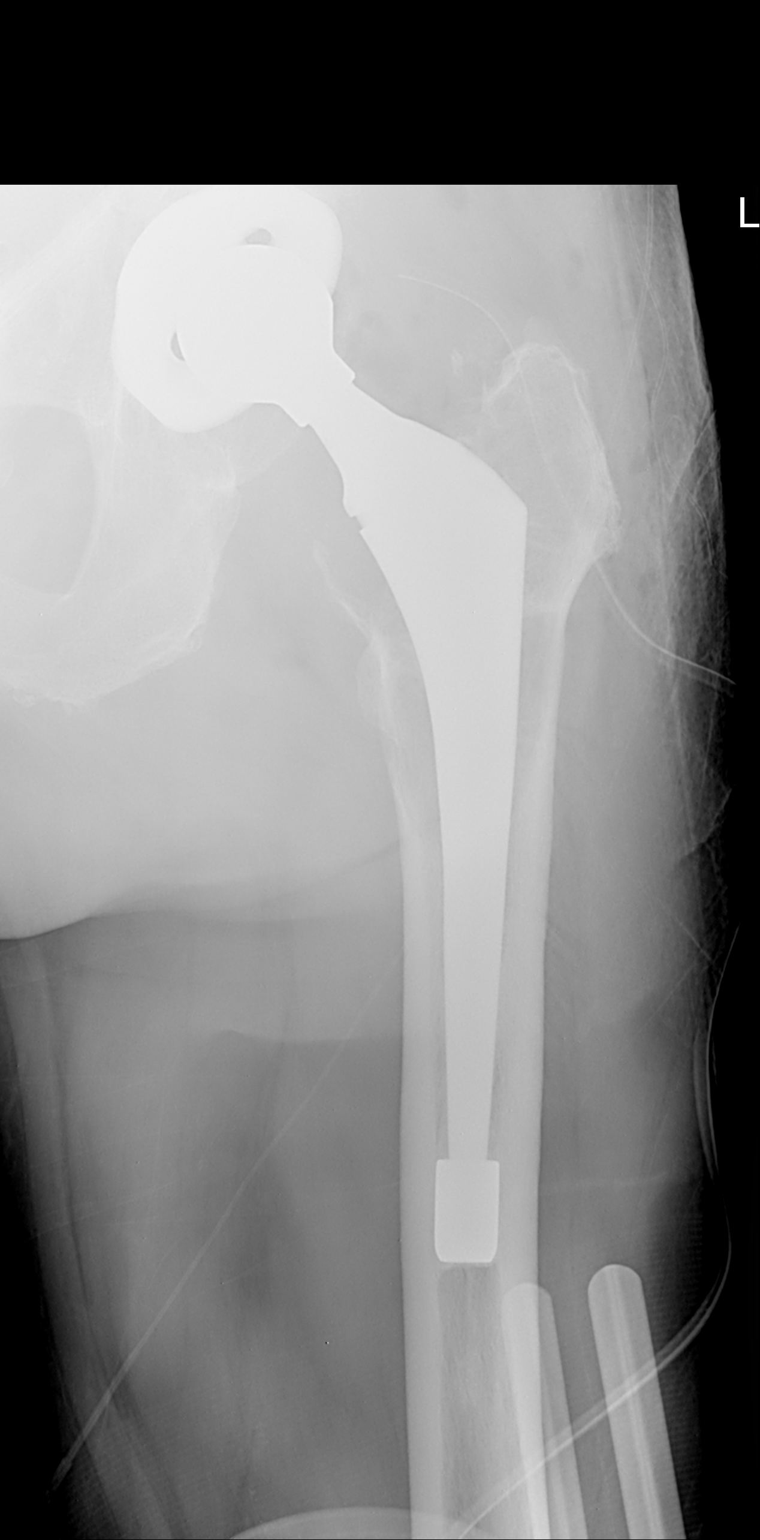

[1 of 1 positions shown; findings below may reference images not displayed]

FINDINGS: Revision of left hip replacement.  There appears to be
new femoral head component.  Normal alignment and no acute
radiographic complication.
IMPRESSION: Satisfactory left hip replacement.

## 2012-04-06 ENCOUNTER — Telehealth: Payer: Self-pay | Admitting: Cardiology

## 2012-04-06 NOTE — Telephone Encounter (Signed)
Samples of crestor 5 mg # 28 Lot # [id number] exp 10/15 gathered, called and advised

## 2012-04-06 NOTE — Telephone Encounter (Signed)
Please return call to patient wife Pryor Curia [phone] regarding crestor samples.

## 2012-05-06 ENCOUNTER — Telehealth: Payer: Self-pay | Admitting: Cardiology

## 2012-05-06 NOTE — Telephone Encounter (Signed)
Pt needs samples of crestor, pls call [phone]

## 2012-05-06 NOTE — Telephone Encounter (Signed)
Samples gathered and put up front

## 2012-06-22 ENCOUNTER — Telehealth: Payer: Self-pay | Admitting: Cardiology

## 2012-06-22 NOTE — Telephone Encounter (Signed)
Pt needs samples of crestor 5mg 

## 2012-06-22 NOTE — Telephone Encounter (Signed)
Patient's wife called crestor 10 mg take 1/2 tablet samples left at front desk 3 rd floor.

## 2012-07-21 ENCOUNTER — Other Ambulatory Visit: Payer: Self-pay | Admitting: Cardiology

## 2012-07-21 DIAGNOSIS — N400 Enlarged prostate without lower urinary tract symptoms: Secondary | ICD-10-CM

## 2012-07-21 NOTE — Telephone Encounter (Signed)
Refill medication

## 2012-07-22 MED ORDER — TERAZOSIN HCL 5 MG PO CAPS: 5.0000 mg | ORAL_CAPSULE | Freq: Every day | ORAL | Status: DC

## 2012-08-05 ENCOUNTER — Telehealth: Payer: Self-pay | Admitting: Cardiology

## 2012-08-05 NOTE — Telephone Encounter (Signed)
Samples left up front for patient

## 2012-08-05 NOTE — Telephone Encounter (Signed)
New msg Pt's wife called and wanted crestor samples. Please let her know

## 2012-08-18 ENCOUNTER — Other Ambulatory Visit: Payer: Self-pay | Admitting: *Deleted

## 2012-08-18 ENCOUNTER — Telehealth: Payer: Self-pay | Admitting: Cardiology

## 2012-08-18 DIAGNOSIS — N400 Enlarged prostate without lower urinary tract symptoms: Secondary | ICD-10-CM

## 2012-08-18 DIAGNOSIS — I119 Hypertensive heart disease without heart failure: Secondary | ICD-10-CM

## 2012-08-18 MED ORDER — FINASTERIDE 5 MG PO TABS: 5.0000 mg | ORAL_TABLET | Freq: Every day | ORAL | Status: DC

## 2012-08-18 MED ORDER — METOPROLOL TARTRATE 50 MG PO TABS: 50.0000 mg | ORAL_TABLET | Freq: Two times a day (BID) | ORAL | Status: DC

## 2012-08-18 NOTE — Telephone Encounter (Signed)
.   Requested Prescriptions   Signed Prescriptions Disp Refills  . finasteride (PROSCAR) 5 MG tablet 90 tablet 3    Sig: Take 1 tablet (5 mg total) by mouth daily.    Authorizing Provider: Cassell Clement    Ordering User: Lacie Scotts metoprolol (LOPRESSOR) 50 MG tablet 180 tablet 3    Sig: Take 1 tablet (50 mg total) by mouth 2 (two) times daily.    Authorizing Provider: Cassell Clement    Ordering User: Lacie Scotts   E-filled to listed pharmacy in epic.

## 2012-08-18 NOTE — Telephone Encounter (Signed)
New Problem:    Patient's wife called in needing refills of the patient's metoprolol (LOPRESSOR) 50 MG tablet and finasteride (PROSCAR) 5 MG tablet faxed in to the pharmacy listed on file.

## 2012-08-25 ENCOUNTER — Telehealth: Payer: Self-pay | Admitting: Cardiology

## 2012-08-25 NOTE — Telephone Encounter (Signed)
Left message to call back  

## 2012-08-25 NOTE — Telephone Encounter (Signed)
Wife will call PCP since  Dr. Patty Sermons out of the office until 9/23

## 2012-08-25 NOTE — Telephone Encounter (Signed)
Pt's wife requesting pt go back on aricept, wants faxed  into [facility]

## 2012-08-28 ENCOUNTER — Telehealth: Payer: Self-pay | Admitting: Cardiology

## 2012-08-28 DIAGNOSIS — M1990 Unspecified osteoarthritis, unspecified site: Secondary | ICD-10-CM

## 2012-08-28 MED ORDER — CELECOXIB 200 MG PO CAPS: 200.0000 mg | ORAL_CAPSULE | Freq: Every day | ORAL | Status: AC

## 2012-08-28 NOTE — Telephone Encounter (Signed)
Rx sent to [facility] as requested.  

## 2012-08-28 NOTE — Telephone Encounter (Signed)
plz return call to patient  Wife [phone], who is trying to refill Celebrex via [facility] but there is no RX on file.

## 2012-09-14 ENCOUNTER — Telehealth: Payer: Self-pay | Admitting: Cardiology

## 2012-09-14 NOTE — Telephone Encounter (Signed)
Crestor 5 mg lot # [id number]  Exp 3/16. Up front for patient to pick up, advised wife

## 2012-09-14 NOTE — Telephone Encounter (Signed)
plz return call to pt wife gwen concerning Crestor samples. She can be reached at hm#

## 2012-10-05 ENCOUNTER — Telehealth: Payer: Self-pay | Admitting: Cardiology

## 2012-10-05 NOTE — Telephone Encounter (Signed)
crestor samples 10 mg exp 04/16 # [id number] placed up front for pick up  No answer, will try again tomorrow

## 2012-10-05 NOTE — Telephone Encounter (Signed)
Pt needs samples of crestor °

## 2012-10-06 NOTE — Telephone Encounter (Signed)
Advised wife  

## 2012-10-10 ENCOUNTER — Encounter (HOSPITAL_COMMUNITY): Payer: Self-pay | Admitting: *Deleted

## 2012-10-10 ENCOUNTER — Emergency Department (HOSPITAL_COMMUNITY): Payer: Medicare Other

## 2012-10-10 ENCOUNTER — Emergency Department (HOSPITAL_COMMUNITY)
Admission: EM | Admit: 2012-10-10 | Discharge: 2012-10-10 | Disposition: A | Discharge status: Home / Self Care | Payer: Medicare Other | Attending: Emergency Medicine | Admitting: Emergency Medicine

## 2012-10-10 ENCOUNTER — Encounter (HOSPITAL_COMMUNITY)
Admission: EM | Disposition: A | Discharge status: Home / Self Care | Payer: Self-pay | Source: Home / Self Care | Attending: Emergency Medicine

## 2012-10-10 ENCOUNTER — Emergency Department (HOSPITAL_COMMUNITY): Payer: Medicare Other | Admitting: *Deleted

## 2012-10-10 DIAGNOSIS — X500XXA Overexertion from strenuous movement or load, initial encounter: Secondary | ICD-10-CM | POA: Insufficient documentation

## 2012-10-10 DIAGNOSIS — I252 Old myocardial infarction: Secondary | ICD-10-CM | POA: Insufficient documentation

## 2012-10-10 DIAGNOSIS — E7800 Pure hypercholesterolemia, unspecified: Secondary | ICD-10-CM | POA: Insufficient documentation

## 2012-10-10 DIAGNOSIS — T84029A Dislocation of unspecified internal joint prosthesis, initial encounter: Secondary | ICD-10-CM | POA: Insufficient documentation

## 2012-10-10 DIAGNOSIS — I10 Essential (primary) hypertension: Secondary | ICD-10-CM | POA: Insufficient documentation

## 2012-10-10 DIAGNOSIS — I2510 Atherosclerotic heart disease of native coronary artery without angina pectoris: Secondary | ICD-10-CM | POA: Insufficient documentation

## 2012-10-10 DIAGNOSIS — Z96649 Presence of unspecified artificial hip joint: Secondary | ICD-10-CM | POA: Insufficient documentation

## 2012-10-10 HISTORY — PX: HIP CLOSED REDUCTION: SHX983

## 2012-10-10 HISTORY — DX: Essential (primary) hypertension: I10

## 2012-10-10 LAB — CBC WITH DIFFERENTIAL/PLATELET
Basophils Absolute: 0 10*3/uL (ref 0.0–0.1)
Basophils Relative: 1 % (ref 0–1)
Eosinophils Absolute: 0.2 10*3/uL (ref 0.0–0.7)
Eosinophils Relative: 3 % (ref 0–5)
HCT: 35 % — ABNORMAL LOW (ref 39.0–52.0)
MCH: 30.4 pg (ref 26.0–34.0)
MCHC: 33.7 g/dL (ref 30.0–36.0)
MCV: 90.2 fL (ref 78.0–100.0)
Monocytes Absolute: 0.6 10*3/uL (ref 0.1–1.0)
Platelets: 140 10*3/uL — ABNORMAL LOW (ref 150–400)
RDW: 13.2 % (ref 11.5–15.5)

## 2012-10-10 LAB — POCT I-STAT, CHEM 8
Creatinine, Ser: 1.6 mg/dL — ABNORMAL HIGH (ref 0.50–1.35)
HCT: 37 % — ABNORMAL LOW (ref 39.0–52.0)
Hemoglobin: 12.6 g/dL — ABNORMAL LOW (ref 13.0–17.0)
Sodium: 142 mEq/L (ref 135–145)
TCO2: 24 mmol/L (ref 0–100)

## 2012-10-10 LAB — BASIC METABOLIC PANEL
CO2: 26 mEq/L (ref 19–32)
Calcium: 8.7 mg/dL (ref 8.4–10.5)
Creatinine, Ser: 1.48 mg/dL — ABNORMAL HIGH (ref 0.50–1.35)
GFR calc Af Amer: 48 mL/min — ABNORMAL LOW (ref >90)
GFR calc non Af Amer: 42 mL/min — ABNORMAL LOW (ref >90)
Sodium: 137 mEq/L (ref 135–145)

## 2012-10-10 SURGERY — CLOSED MANIPULATION, JOINT, HIP
Anesthesia: General | Site: Hip | Laterality: Right | Wound class: Clean

## 2012-10-10 MED ORDER — ETOMIDATE 2 MG/ML IV SOLN
INTRAVENOUS | Status: DC | PRN
  Administered 2012-10-10: 4 mg via INTRAVENOUS

## 2012-10-10 MED ORDER — METHOCARBAMOL 500 MG PO TABS: 500.0000 mg | ORAL_TABLET | Freq: Four times a day (QID) | ORAL | Status: DC | PRN

## 2012-10-10 MED ORDER — GLYCOPYRROLATE 0.2 MG/ML IJ SOLN
INTRAMUSCULAR | Status: DC | PRN
  Administered 2012-10-10: 0.2 mg via INTRAVENOUS

## 2012-10-10 MED ORDER — PROMETHAZINE HCL 25 MG/ML IJ SOLN: 6.2500 mg | INTRAMUSCULAR | Status: DC | PRN

## 2012-10-10 MED ORDER — FENTANYL CITRATE 0.05 MG/ML IJ SOLN: 25.0000 ug | INTRAMUSCULAR | Status: DC | PRN

## 2012-10-10 MED ORDER — LIDOCAINE HCL (CARDIAC) 20 MG/ML IV SOLN
INTRAVENOUS | Status: DC | PRN
  Administered 2012-10-10: 50 mg via INTRAVENOUS

## 2012-10-10 MED ORDER — ONDANSETRON HCL 4 MG/2ML IJ SOLN
INTRAMUSCULAR | Status: DC | PRN
  Administered 2012-10-10: .5 mg via INTRAVENOUS

## 2012-10-10 MED ORDER — MEPERIDINE HCL 50 MG/ML IJ SOLN: 6.2500 mg | INTRAMUSCULAR | Status: DC | PRN

## 2012-10-10 MED ORDER — LACTATED RINGERS IV SOLN: INTRAVENOUS | Status: DC

## 2012-10-10 MED ORDER — PROPOFOL 10 MG/ML IV EMUL
INTRAVENOUS | Status: DC | PRN
  Administered 2012-10-10: 40 mg via INTRAVENOUS

## 2012-10-10 MED ORDER — HYDROCODONE-ACETAMINOPHEN 5-325 MG PO TABS: 1.0000 | ORAL_TABLET | ORAL | Status: DC | PRN

## 2012-10-10 MED ORDER — FENTANYL CITRATE 0.05 MG/ML IJ SOLN
INTRAMUSCULAR | Status: DC | PRN
  Administered 2012-10-10: 25 ug via INTRAVENOUS

## 2012-10-10 MED ORDER — LACTATED RINGERS IV SOLN
INTRAVENOUS | Status: DC | PRN
  Administered 2012-10-10: 20:00:00 via INTRAVENOUS

## 2012-10-10 SURGICAL SUPPLY — 3 items
CLOTH BEACON ORANGE TIMEOUT ST (SAFETY) ×2 IMPLANT
IMMOBILIZER KNEE 20 (SOFTGOODS) ×2
IMMOBILIZER KNEE 20 THIGH 36 (SOFTGOODS) ×1 IMPLANT

## 2012-10-10 NOTE — H&P (Signed)
[name] is an 78 y.o. male.   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 Patient is a patient of Dr. Lequita Halt. Patient was bending over to put a shoe on this evening when he felt his hip dislocate. Patient was brought to the emergency room for evaluation found to have a dislocated right total hip arthroplasty  Past Medical History  Diagnosis Date  . Coronary artery disease   . Myocardial infarction     Previous anterolateral myocardial infarction with presistent ST-T wave changes  . Left hip pain     Chronic left hip discomfort  . 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 Tachycardia     History of paroxysmal supraventricular tachycardia  . Hypercholesterolemia   . History of BPH   . Osteoarthritis   . Hypertension     Past Surgical History  Procedure Date  . Cardiac catheterization 05/07/2002    Ejection fraction 20%  . Angioplasty 2002    with stent and angioplasty in 2003  . Hip surgery   . Hip closed reduction 01/31/2012    Procedure: CLOSED MANIPULATION HIP;  Surgeon: Loanne Drilling, MD;  Location: [facility];  Service: Orthopedics;  Laterality: Right;  closed reduction right dislocated hip  . Appendectomy   . Tonsillectomy     Family History  Problem Relation Age of Onset  . Heart attack Father   . Stroke Father   . Stroke Mother   . Alzheimer's disease Mother    Social History:  reports that he has never smoked. He has never used smokeless tobacco. He reports that he does not drink alcohol or use illicit drugs.  Allergies:  Allergies  Allergen Reactions  . Prednisone     REACTION: causes tachycardia     (Not in a hospital admission)  Results for orders placed during the hospital encounter of 10/10/12 (from the past 48 hour(s))  CBC WITH DIFFERENTIAL     Status: Abnormal   Collection Time   10/10/12  7:25 PM      Component Value Range Comment   WBC 5.9  4.0 - 10.5 K/uL    RBC 3.88 (*) 4.22 - 5.81 MIL/uL    Hemoglobin 11.8 (*) 13.0 - 17.0 g/dL    HCT 16.1 (*) 09.6 - 52.0 %    MCV 90.2  78.0 - 100.0 fL    MCH 30.4  26.0 - 34.0 pg    MCHC 33.7  30.0 - 36.0 g/dL    RDW 04.5  40.9 - 81.1 %    Platelets 140 (*) 150 - 400 K/uL    Neutrophils Relative 69  43 - 77 %    Neutro Abs 4.1  1.7 - 7.7 K/uL    Lymphocytes Relative 18  12 - 46 %    Lymphs Abs 1.1  0.7 - 4.0 K/uL    Monocytes Relative 10  3 - 12 %    Monocytes Absolute 0.6  0.1 - 1.0 K/uL    Eosinophils Relative 3  0 - 5 %    Eosinophils Absolute 0.2  0.0 - 0.7 K/uL    Basophils Relative 1  0 - 1 %    Basophils Absolute 0.0  0.0 - 0.1 K/uL    Dg Hip Complete Right  10/10/2012  *RADIOLOGY REPORT*  Clinical Data: 78 year old male with pain in the right hip.  RIGHT HIP - COMPLETE 2+ VIEW  Comparison: 01/31/2012.  Findings: Recurrent the superior dislocation of the right total hip arthroplasty.  Similar positioning to the prior episode.  Hardware components appear intact.  No associated acute fracture identified. Left total hip arthroplasty changes appear stable.  Degenerative changes in the lower lumbar spine with disc space narrowing and endplate spurring and sclerosis.  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   Original Report Authenticated By: Harley Hallmark, M.D.     Review of Systems  Musculoskeletal: Positive for joint pain.       Right hip after popping out.  All other systems reviewed and are negative.    Blood pressure 174/70, pulse 41, temperature 98.8 F (37.1 C), resp. rate 14, SpO2 93.00%. Physical Exam patient is conscious alert and appropriate appears to be fairly comfortable in the emergency room gurney. He is sitting with his right hip flexed up on a pillow for comfort he's got good pulses good sensation in his right ankle. His left lower extremity was normal to physical  exam with range of motion the hip knee and ankle with good pulses. Patient's lungs were clear throughout his heart was regular though slow his abdomen was soft and nontender. You good range of motion of his upper extremities he had no pain with range of motion of his neck  Assessment/Plan Dislocated right total hip arthroplasty   Plan blood work will be drawn patient will be taken to the OR for a closed reduction under general anesthesia. Patient is aware of the procedure as he has had it done 5 or 6 times previous.  Jamelle Rushing 10/10/2012, 7:48 PM

## 2012-10-10 NOTE — Anesthesia Postprocedure Evaluation (Signed)
  Anesthesia Post-op Note  Patient: [name]  Procedure(s) Performed: Procedure(s) (LRB): CLOSED MANIPULATION HIP (Right)  Patient Location: PACU  Anesthesia Type: General  Level of Consciousness: fully awake and alert. Chatting. appropriate  Airway and Oxygen Therapy: Patient Spontanous Breathing  Post-op Pain: none  Post-op Assessment: Post-op Vital signs reviewed, Patient's Cardiovascular Status Stable, Respiratory Function Stable, Patent Airway and No signs of Nausea or vomiting  Post-op Vital Signs: stable  Complications: No apparent anesthesia complications

## 2012-10-10 NOTE — ED Notes (Signed)
Pt states he was bending over to put shoes on and felt/heard a pop in the R hip with some pain. Pt states his hip occasionally (has happened 4 times prior to this one) and he has to "get it put back in."

## 2012-10-10 NOTE — ED Notes (Signed)
Bed:WA15<BR> Expected date:<BR> Expected time:<BR> Means of arrival:<BR> Comments:<BR>

## 2012-10-10 NOTE — Preoperative (Signed)
Beta Blockers   Reason not to administer Beta Blockers :Not Applicable took beta blocker this a.m. 

## 2012-10-10 NOTE — Op Note (Signed)
Preop diagnosis dislocated right total hip arthroplasty Postoperative diagnosis same Procedure closed reduction of dislocated total hip arthroplasty. Exam under anesthesia. Stress radiography Surgeon Valma Cava M.D. Anesthesia Gen. Estimate blood loss none Complications none Disposition PACU stable.  Operative details Patient was counseled in the holding area cracks thousand and 5 marked appropriately. Also discussed this with the patient and his wife. Taken to the operating room placed under general anesthesia. Placed on the OR table gently. Utilizing longitudinal traction flexion and gentle internal or external rotation to close reduction was obtained palpable and audible. Lightly for symmetric. Stable range of motion and 9 of flexion 30 internal and external rotation and 20 of abduction. C-arm confirmed about stress radiography findings. C-arm was utilized to confirm no complicating factors. Placed into a knee immobilizer. Awakened taken from the operating room to the PACU in stable condition. Pulses remained intact unchanged.  Plan will be stable patient will be stabilized in the PACU and then discharge to home in the care of his wife as he is familiar with dislocations what to do afterwards.

## 2012-10-10 NOTE — H&P (Signed)
[name] is an 78 y.o. male.   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 Patient is a patient of Dr. Lequita Halt. Patient was bending over to put a shoe on this evening when he felt his hip dislocate. Patient was brought to the emergency room for evaluation found to have a dislocated right total hip arthroplasty  Past Medical History  Diagnosis Date  . Coronary artery disease   . Myocardial infarction     Previous anterolateral myocardial infarction with presistent ST-T wave changes  . Left hip pain     Chronic left hip discomfort  . 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 Tachycardia     History of paroxysmal supraventricular tachycardia  . Hypercholesterolemia   . History of BPH   . Osteoarthritis   . Hypertension     Past Surgical History  Procedure Date  . Cardiac catheterization 05/07/2002    Ejection fraction 20%  . Angioplasty 2002    with stent and angioplasty in 2003  . Hip surgery   . Hip closed reduction 01/31/2012    Procedure: CLOSED MANIPULATION HIP;  Surgeon: Loanne Drilling, MD;  Location: [facility];  Service: Orthopedics;  Laterality: Right;  closed reduction right dislocated hip  . Appendectomy   . Tonsillectomy     Family History  Problem Relation Age of Onset  . Heart attack Father   . Stroke Father   . Stroke Mother   . Alzheimer's disease Mother    Social History:  reports that he has never smoked. He has never used smokeless tobacco. He reports that he does not drink alcohol or use illicit drugs.  Allergies:  Allergies  Allergen Reactions  . Prednisone     REACTION: causes tachycardia     (Not in a hospital admission)  Results for orders placed during the hospital encounter of 10/10/12 (from the past 48 hour(s))  CBC WITH DIFFERENTIAL     Status: Abnormal   Collection Time   10/10/12  7:25 PM      Component Value Range Comment   WBC 5.9  4.0 - 10.5 K/uL    RBC 3.88 (*) 4.22 - 5.81 MIL/uL    Hemoglobin 11.8 (*) 13.0 - 17.0 g/dL    HCT 16.1 (*) 09.6 - 52.0 %    MCV 90.2  78.0 - 100.0 fL    MCH 30.4  26.0 - 34.0 pg    MCHC 33.7  30.0 - 36.0 g/dL    RDW 04.5  40.9 - 81.1 %    Platelets 140 (*) 150 - 400 K/uL    Neutrophils Relative 69  43 - 77 %    Neutro Abs 4.1  1.7 - 7.7 K/uL    Lymphocytes Relative 18  12 - 46 %    Lymphs Abs 1.1  0.7 - 4.0 K/uL    Monocytes Relative 10  3 - 12 %    Monocytes Absolute 0.6  0.1 - 1.0 K/uL    Eosinophils Relative 3  0 - 5 %    Eosinophils Absolute 0.2  0.0 - 0.7 K/uL    Basophils Relative 1  0 - 1 %    Basophils Absolute 0.0  0.0 - 0.1 K/uL   POCT I-STAT, CHEM 8     Status: Abnormal   Collection Time   10/10/12  7:56 PM  Component Value Range Comment   Sodium 142  135 - 145 mEq/L    Potassium 4.9  3.5 - 5.1 mEq/L    Chloride 107  96 - 112 mEq/L    BUN 32 (*) 6 - 23 mg/dL    Creatinine, Ser 2.13 (*) 0.50 - 1.35 mg/dL    Glucose, Bld 97  70 - 99 mg/dL    Calcium, Ion 0.86  5.78 - 1.30 mmol/L    TCO2 24  0 - 100 mmol/L    Hemoglobin 12.6 (*) 13.0 - 17.0 g/dL    HCT 46.9 (*) 62.9 - 52.0 %    Dg Hip Complete Right  10/10/2012  *RADIOLOGY REPORT*  Clinical Data: 78 year old male with pain in the right hip.  RIGHT HIP - COMPLETE 2+ VIEW  Comparison: 01/31/2012.  Findings: Recurrent the superior dislocation of the right total hip arthroplasty.  Similar positioning to the prior episode.  Hardware components appear intact.  No associated acute fracture identified. Left total hip arthroplasty changes appear stable.  Degenerative changes in the lower lumbar spine with disc space narrowing and endplate spurring and sclerosis.  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   Original Report Authenticated By: Harley Hallmark, M.D.     Review of Systems  Musculoskeletal: Positive for joint pain.        Right hip after popping out.  All other systems reviewed and are negative.    Blood pressure 174/70, pulse 41, temperature 98.8 F (37.1 C), resp. rate 14, SpO2 93.00%. Physical Exam patient is conscious alert and appropriate appears to be fairly comfortable in the emergency room gurney. He is sitting with his right hip flexed up on a pillow for comfort he's got good pulses good sensation in his right ankle. His left lower extremity was normal to physical exam with range of motion the hip knee and ankle with good pulses. Patient's lungs were clear throughout his heart was regular though slow his abdomen was soft and nontender. You good range of motion of his upper extremities he had no pain with range of motion of his neck  Assessment/Plan Dislocated right total hip arthroplasty   Plan blood work will be drawn patient will be taken to the OR for a closed reduction under general anesthesia. Patient is aware of the procedure as he has had it done 5 or 6 times previous.  Whitnie Deleon ANDREW 10/10/2012, 8:22 PM

## 2012-10-10 NOTE — Transfer of Care (Signed)
Immediate Anesthesia Transfer of Care Note  Patient: [name]  Procedure(s) Performed: Procedure(s) (LRB) with comments: CLOSED MANIPULATION HIP (Right)  Patient Location: PACU  Anesthesia Type: General  Level of Consciousness: awake, alert , oriented and patient cooperative  Airway & Oxygen Therapy: Patient Spontanous Breathing  Post-op Assessment: Report given to PACU RN, Post -op Vital signs reviewed and stable and Patient moving all extremities  Post vital signs: Reviewed and stable  Complications: No apparent anesthesia complications

## 2012-10-10 NOTE — Anesthesia Preprocedure Evaluation (Signed)
Anesthesia Evaluation  Patient identified by MRN, date of birth, ID band Patient awake  General Assessment Comment:Ate lunch 11:00  Reviewed: Allergy & Precautions, H&P , NPO status , Patient's Chart, lab work & pertinent test results, reviewed documented beta blocker date and time   Airway Mallampati: II TM Distance: >3 FB Neck ROM: Full    Dental  (+) Teeth Intact and Dental Advisory Given   Pulmonary neg pulmonary ROS,  breath sounds clear to auscultation        Cardiovascular hypertension, Pt. on medications + CAD and + Past MI Rhythm:Regular Rate:Normal  Cad, s/p PTca w/ stent 2002 Currently asymptomatic   Neuro/Psych Some memory deficits negative psych ROS   GI/Hepatic negative GI ROS, Neg liver ROS,   Endo/Other  negative endocrine ROS  Renal/GU Cr 1.59  negative genitourinary   Musculoskeletal negative musculoskeletal ROS (+)   Abdominal   Peds negative pediatric ROS (+)  Hematology negative hematology ROS (+)   Anesthesia Other Findings   Reproductive/Obstetrics negative OB ROS                           Anesthesia Physical  Anesthesia Plan  ASA: III and Emergent  Anesthesia Plan: General   Post-op Pain Management:    Induction: Intravenous  Airway Management Planned: Mask  Additional Equipment:   Intra-op Plan:   Post-operative Plan:   Informed Consent: I have reviewed the patients History and Physical, chart, labs and discussed the procedure including the risks, benefits and alternatives for the proposed anesthesia with the patient or authorized representative who has indicated his/her understanding and acceptance.     Plan Discussed with: CRNA and Surgeon  Anesthesia Plan Comments:         Anesthesia Quick Evaluation

## 2012-10-10 NOTE — ED Provider Notes (Addendum)
History    CSN: [id number] Arrival date & time 10/10/12  1756 First MD Initiated Contact with Patient 10/10/12 1853     Chief Complaint  Patient presents with  . Hip Pain    pt states, "it is popped out of joint."   HPI Comments: Pt has history of recurrent shoulder dislocation.  He was bending over to put on his shoes when he felt a pop in his right hip.  This is the 5th time it has occurred.  He previously had it reduced in the OR.  Patient is a 78 y.o. male presenting with hip pain. The history is provided by the patient.  Hip Pain This is a new problem. The current episode started 1 to 2 hours ago. The problem occurs constantly. The problem has not changed since onset.Pertinent negatives include no chest pain, no abdominal pain, no headaches and no shortness of breath. Exacerbated by: movement.    Past Medical History  Diagnosis Date  . Coronary artery disease   . Myocardial infarction     Previous anterolateral myocardial infarction with presistent ST-T wave changes  . Left hip pain     Chronic left hip discomfort  . 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 Tachycardia     History of paroxysmal supraventricular tachycardia  . Hypercholesterolemia   . History of BPH   . Osteoarthritis   . Hypertension     Past Surgical History  Procedure Date  . Cardiac catheterization 05/07/2002    Ejection fraction 20%  . Angioplasty 2002    with stent and angioplasty in 2003  . Hip surgery   . Hip closed reduction 01/31/2012    Procedure: CLOSED MANIPULATION HIP;  Surgeon: Loanne Drilling, MD;  Location: [facility];  Service: Orthopedics;  Laterality: Right;  closed reduction right dislocated hip  . Appendectomy   . Tonsillectomy     Family History  Problem Relation Age of Onset  . Heart attack Father   . Stroke Father   . Stroke Mother   . Alzheimer's disease Mother     History  Substance Use Topics  . Smoking status: Never Smoker     . Smokeless tobacco: Never Used  . Alcohol Use: No      Review of Systems  Constitutional: Negative for fever.  Respiratory: Negative for shortness of breath.   Cardiovascular: Negative for chest pain.  Gastrointestinal: Negative for abdominal pain.  Neurological: Negative for headaches.  All other systems reviewed and are negative.    Allergies  Prednisone  Home Medications   Current Outpatient Rx  Name Route Sig Dispense Refill  . ASPIRIN EC 81 MG PO TBEC Oral Take 81 mg by mouth daily.    . CELECOXIB 200 MG PO CAPS Oral Take 1 capsule (200 mg total) by mouth daily. 90 capsule 3  . VITAMIN D 1000 UNITS PO TABS Oral Take 1,000 Units by mouth daily.    [name] CLOPIDOGREL BISULFATE 75 MG PO TABS Oral Take 1 tablet (75 mg total) by mouth daily. 90 tablet 3  . DONEPEZIL HCL 10 MG PO TABS Oral Take 10 mg by mouth at bedtime.    [name] FINASTERIDE 5 MG PO TABS Oral Take 1 tablet (5 mg total) by mouth daily. 90 tablet 3  . OMEGA-3 FATTY ACIDS 1000 MG PO CAPS Oral Take 1 g by mouth daily.    [name] METOPROLOL TARTRATE 50 MG PO TABS Oral Take 1 tablet (50  mg total) by mouth 2 (two) times daily. 60 tablet 3  . ROSUVASTATIN CALCIUM 10 MG PO TABS Oral Take 5 mg by mouth daily.     [name] TERAZOSIN HCL 5 MG PO CAPS Oral Take 1 capsule (5 mg total) by mouth at bedtime. 90 capsule 3  . VALSARTAN-HYDROCHLOROTHIAZIDE 160-12.5 MG PO TABS Oral Take 1 tablet by mouth daily. 90 tablet 3    BP 171/74  Temp 98.8 F (37.1 C)  Resp 12  SpO2 99%  Physical Exam  Nursing note and vitals reviewed. Constitutional: He appears well-developed and well-nourished. No distress.  HENT:  Head: Normocephalic and atraumatic.  Right Ear: External ear normal.  Left Ear: External ear normal.  Eyes: Conjunctivae normal are normal. Right eye exhibits no discharge. Left eye exhibits no discharge. No scleral icterus.  Neck: Neck supple. No tracheal deviation present.  Cardiovascular: Normal rate, regular rhythm and intact distal  pulses.   Pulmonary/Chest: Effort normal and breath sounds normal. No stridor. No respiratory distress. He has no wheezes. He has no rales.  Abdominal: Soft. Bowel sounds are normal. He exhibits no distension. There is no tenderness. There is no rebound and no guarding.  Musculoskeletal: He exhibits no edema and no tenderness.       Right hip: He exhibits decreased range of motion, tenderness and deformity.       Distal nv intact, shortened rle  Neurological: He is alert. He has normal strength. No sensory deficit. Cranial nerve deficit:  no gross defecits noted. He exhibits normal muscle tone. He displays no seizure activity. Coordination normal.  Skin: Skin is warm and dry. No rash noted.  Psychiatric: He has a normal mood and affect.    ED Course  Procedures (including critical care time)  Rate: 42  Rhythm: Sinus bradycardia  QRS Axis: normal  Intervals: normal  ST/T Wave abnormalities: Anterior repolarization abnormality  Conduction Disutrbances:none  Narrative Interpretation: Left ventricular hypertrophy  Old EKG Reviewed: No significant change compared to prior tracing 01/31/2012  Labs Reviewed - No data to display Dg Hip Complete Right  10/10/2012  *RADIOLOGY REPORT*  Clinical Data: 78 year old male with pain in the right hip.  RIGHT HIP - COMPLETE 2+ VIEW  Comparison: 01/31/2012.  Findings: Recurrent the superior dislocation of the right total hip arthroplasty.  Similar positioning to the prior episode.  Hardware components appear intact.  No associated acute fracture identified. Left total hip arthroplasty changes appear stable.  Degenerative changes in the lower lumbar spine with disc space narrowing and endplate spurring and sclerosis.  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   Original Report Authenticated By: Ulla Potash III, M.D.      1. Recurrent dislocation of hip joint prosthesis       MDM  Pt has a recurrent hip dislocation.  Old  records reviewed.  Previously reduced in the OR.  Considering his cardiac risk factors more appropriately reduced in that setting.  Pt made NPO.  Dr Thomasena Edis will be coming to see the patient to plan on reduction in the OR.  I offered pain medications but the patient did not want anything at this time. Preop labs and EKG have been ordered    Celene Kras, MD 10/10/12 1928

## 2012-10-12 ENCOUNTER — Encounter (HOSPITAL_COMMUNITY): Payer: Self-pay | Admitting: Specialist

## 2012-10-28 ENCOUNTER — Telehealth: Payer: Self-pay | Admitting: Cardiology

## 2012-10-28 NOTE — Telephone Encounter (Signed)
Pt's wife calling re crestor 5 mg, pt thought dr Patty Sermons told him to take 10mg , wife wants to confirm , also requesting samples, pls call  Wife gwen [phone]

## 2012-10-28 NOTE — Telephone Encounter (Signed)
Spoke with pt and told him he should be taking Crestor 5 mg daily.  He states he has been taking Crestor 10 mg daily.  He will go back to 5 mg daily.  Crestor 5 mg--28 tablets-Lot [id number], exp 2/16 left at front desk for pt to pick up.  He is due for follow up with Dr. Patty Sermons.  Appt made for November 26,2013 at 10:45

## 2012-11-10 ENCOUNTER — Ambulatory Visit (INDEPENDENT_AMBULATORY_CARE_PROVIDER_SITE_OTHER): Payer: Medicare Other | Admitting: Cardiology

## 2012-11-10 ENCOUNTER — Encounter: Payer: Self-pay | Admitting: Cardiology

## 2012-11-10 VITALS — BP 140/78 | HR 56 | Resp 18 | Ht 66.0 in | Wt 172.0 lb

## 2012-11-10 DIAGNOSIS — E785 Hyperlipidemia, unspecified: Secondary | ICD-10-CM

## 2012-11-10 DIAGNOSIS — I10 Essential (primary) hypertension: Secondary | ICD-10-CM

## 2012-11-10 DIAGNOSIS — Z8679 Personal history of other diseases of the circulatory system: Secondary | ICD-10-CM

## 2012-11-10 DIAGNOSIS — I259 Chronic ischemic heart disease, unspecified: Secondary | ICD-10-CM

## 2012-11-10 DIAGNOSIS — I2510 Atherosclerotic heart disease of native coronary artery without angina pectoris: Secondary | ICD-10-CM

## 2012-11-10 DIAGNOSIS — E7800 Pure hypercholesterolemia, unspecified: Secondary | ICD-10-CM

## 2012-11-10 LAB — BASIC METABOLIC PANEL
BUN: 30 mg/dL — ABNORMAL HIGH (ref 6–23)
CO2: 27 mEq/L (ref 19–32)
Chloride: 104 mEq/L (ref 96–112)
Creatinine, Ser: 1.3 mg/dL (ref 0.4–1.5)
Potassium: 3.9 mEq/L (ref 3.5–5.1)

## 2012-11-10 LAB — LIPID PANEL
LDL Cholesterol: 72 mg/dL (ref 0–99)
Total CHOL/HDL Ratio: 3
VLDL: 15.6 mg/dL (ref 0.0–40.0)

## 2012-11-10 LAB — HEPATIC FUNCTION PANEL
Alkaline Phosphatase: 96 U/L (ref 39–117)
Bilirubin, Direct: 0 mg/dL (ref 0.0–0.3)
Total Protein: 6.8 g/dL (ref 6.0–8.3)

## 2012-11-10 NOTE — Patient Instructions (Addendum)
Your physician recommends that you continue on your current medications as directed. Please refer to the Current Medication list given to you today.  Your physician wants you to follow-up in: 6 months with fasting labs (lp/bmet/hfp)  You will receive a reminder letter in the mail two months in advance. If you don't receive a letter, please call our office to schedule the follow-up appointment.  Will obtain labs today and call you with the results (lp/bmet/hfp)  

## 2012-11-10 NOTE — Assessment & Plan Note (Signed)
The patient is now wearing an external brace to prevent him from suffering another hip dislocation of the right hip

## 2012-11-10 NOTE — Progress Notes (Signed)
Quick Note:  Please report to patient. The recent labs are stable. Continue same medication and careful diet. Continue to drink plenty of water ______

## 2012-11-10 NOTE — Progress Notes (Signed)
[name] Date of Birth:  17-May-1925 [facility] [address] [place], [place]  [address] [phone]         Fax   [phone]  History of Present Illness: This pleasant 78 year old gentleman is seen for a followup office visit. He has a history of ischemic heart disease. He had angioplasty and stent in 2002. He had another angioplasty in 2003. In November 2009 he had a nuclear stress test which showed no reversible ischemia. On 02/28/11 he had another nuclear stress test as preop clearance for a hip revision procedure and there was no evidence of ischemia but there was an old scar present. Patient has a past history of paroxysmal supraventricular tachycardia.  In addition the patient has a history of osteoarthritis. He has had dyslipidemia and is on Crestor.  He has had prior bilateral hip replacements.  He has had recurrent dislocations of his right hip prosthesis.  He may have to have additional right hip surgery.  He is scheduled to see Dr. Lequita Halt on 11/24/12.   Current Outpatient Prescriptions  Medication Sig Dispense Refill  . aspirin EC 81 MG tablet Take 81 mg by mouth daily.      . celecoxib (CELEBREX) 200 MG capsule Take 1 capsule (200 mg total) by mouth daily.  90 capsule  3  . cholecalciferol (VITAMIN D) 1000 UNITS tablet Take 1,000 Units by mouth daily.      . clopidogrel (PLAVIX) 75 MG tablet Take 1 tablet (75 mg total) by mouth daily.  90 tablet  3  . donepezil (ARICEPT) 10 MG tablet Take 10 mg by mouth at bedtime.      . finasteride (PROSCAR) 5 MG tablet Take 1 tablet (5 mg total) by mouth daily.  90 tablet  3  . fish oil-omega-3 fatty acids 1000 MG capsule Take 1 g by mouth daily.      [name] HYDROcodone-acetaminophen (NORCO) 5-325 MG per tablet Take 1-2 tablets by mouth every 4 (four) hours as needed for pain.  30 tablet  0  . methocarbamol (ROBAXIN) 500 MG tablet Take 1 tablet (500 mg total) by mouth every 6 (six) hours as needed.  40 tablet  1  .  metoprolol (LOPRESSOR) 50 MG tablet Take 1 tablet (50 mg total) by mouth 2 (two) times daily.  60 tablet  3  . rosuvastatin (CRESTOR) 10 MG tablet Take 5 mg by mouth daily.       [name] terazosin (HYTRIN) 5 MG capsule Take 1 capsule (5 mg total) by mouth at bedtime.  90 capsule  3  . valsartan-hydrochlorothiazide (DIOVAN HCT) 160-12.5 MG per tablet Take 1 tablet by mouth daily.  90 tablet  3    Allergies  Allergen Reactions  . Prednisone     REACTION: causes tachycardia    Patient Active Problem List  Diagnosis  . TUBULOVILLOUS ADENOMA, COLON  . HYPERLIPIDEMIA  . HYPERTENSION  . MYOCARDIAL INFARCTION  . CORONARY ARTERY DISEASE  . GERD  . DIVERTICULOSIS, COLON  . DEGENERATIVE JOINT DISEASE, CERVICAL SPINE  . TRIGGER FINGER  . COUGH  . HYPERGLYCEMIA  . CARPAL TUNNEL SYNDROME, HX OF  . SUPRAVENTRICULAR TACHYCARDIA, HX OF  . NEPHROLITHIASIS, HX OF  . BENIGN PROSTATIC HYPERTROPHY, HX OF  . Unspecified Personal History Presenting Hazards to Health  . APPENDECTOMY, HX OF  . PERCUTANEOUS TRANSLUMINAL CORONARY ANGIOPLASTY, HX OF  . CARPAL TUNNEL RELEASE, RIGHT, HX OF  . Osteoarthritis  . Hip dislocation    History  Smoking  status  . Never Smoker   Smokeless tobacco  . Never Used    History  Alcohol Use No    Family History  Problem Relation Age of Onset  . Heart attack Father   . Stroke Father   . Stroke Mother   . Alzheimer's disease Mother     Review of Systems: Constitutional: no fever chills diaphoresis or fatigue or change in weight.  Head and neck: no hearing loss, no epistaxis, no photophobia or visual disturbance. Respiratory: No cough, shortness of breath or wheezing. Cardiovascular: No chest pain peripheral edema, palpitations. Gastrointestinal: No abdominal distention, no abdominal pain, no change in bowel habits hematochezia or melena. Genitourinary: No dysuria, no frequency, no urgency, no nocturia. Musculoskeletal:No arthralgias, no back pain, no gait  disturbance or myalgias. Neurological: No dizziness, no headaches, no numbness, no seizures, no syncope, no weakness, no tremors. Hematologic: No lymphadenopathy, no easy bruising. Psychiatric: No confusion, no hallucinations, no sleep disturbance.    Physical Exam: Filed Vitals:   11/10/12 1057  BP: 140/78  Pulse: 56  Resp: 18   the general appearance reveals a well-developed well-nourished gentleman who appears younger than his stated age.The head and neck exam reveals pupils equal and reactive.  Extraocular movements are full.  There is no scleral icterus.  The mouth and pharynx are normal.  The neck is supple.  The carotids reveal no bruits.  The jugular venous pressure is normal.  The  thyroid is not enlarged.  There is no lymphadenopathy.  The chest is clear to percussion and auscultation.  There are no rales or rhonchi.  Expansion of the chest is symmetrical.  The precordium is quiet.  The first heart sound is normal.  The second heart sound is physiologically split.  There is a soft systolic ejection murmur at the base.  There is no abnormal lift or heave.  The abdomen is soft and nontender.  The bowel sounds are normal.  The liver and spleen are not enlarged.  There are no abdominal masses.  There are no abdominal bruits.  Extremities reveal good pedal pulses.  There is no phlebitis or edema.  There is no cyanosis or clubbing.  Strength is normal and symmetrical in all extremities.  There is no lateralizing weakness.  There are no sensory deficits.  The skin is warm and dry.  There is no rash.  EKG today shows marked sinus bradycardia with sinus arrhythmia and lateral ST-T wave abnormalities.  Improved since previous EKG.   Assessment / Plan: The patient is to continue same medications.  We will plan to recheck him in 6 months for followup office visit lipid panel hepatic function panel and basal metabolic panel.  Blood work today is pending.

## 2012-11-10 NOTE — Assessment & Plan Note (Signed)
Blood pressure today is at the upper limit of normal.  He will work harder on diet and weight loss and avoid dietary salt and continue on current therapy

## 2012-11-10 NOTE — Assessment & Plan Note (Signed)
The patient has had no recurrence of his symptomatic paroxysmal supraventricular tachycardia.

## 2012-11-10 NOTE — Assessment & Plan Note (Signed)
The patient has not been experiencing any exertional chest pain or angina.  He does not have to take any sublingual nitroglycerin.  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

## 2012-11-10 NOTE — Assessment & Plan Note (Signed)
The patient has a history of hyperlipidemia.  He is on Crestor 10 mg daily.  Is not having any myalgias or side effects from his statin therapy.  Blood work today is pending

## 2012-11-19 ENCOUNTER — Telehealth: Payer: Self-pay | Admitting: *Deleted

## 2012-11-19 NOTE — Telephone Encounter (Signed)
Advised patient of lab results  

## 2012-11-19 NOTE — Telephone Encounter (Signed)
Message copied by Burnell Blanks on Thu Nov 19, 2012  4:44 PM ------      Message from: Cassell Clement      Created: Tue Nov 10, 2012  5:59 PM       Please report to patient.  The recent labs are stable. Continue same medication and careful diet.  Continue to drink plenty of water

## 2013-01-08 ENCOUNTER — Other Ambulatory Visit: Payer: Self-pay | Admitting: *Deleted

## 2013-01-08 DIAGNOSIS — N400 Enlarged prostate without lower urinary tract symptoms: Secondary | ICD-10-CM

## 2013-01-08 MED ORDER — TERAZOSIN HCL 5 MG PO CAPS: 5.0000 mg | ORAL_CAPSULE | Freq: Every day | ORAL | Status: DC

## 2013-01-11 ENCOUNTER — Other Ambulatory Visit: Payer: Self-pay

## 2013-01-11 DIAGNOSIS — I259 Chronic ischemic heart disease, unspecified: Secondary | ICD-10-CM

## 2013-01-11 MED ORDER — VALSARTAN-HYDROCHLOROTHIAZIDE 160-12.5 MG PO TABS: 1.0000 | ORAL_TABLET | Freq: Every day | ORAL | Status: DC

## 2013-01-11 MED ORDER — CLOPIDOGREL BISULFATE 75 MG PO TABS
75.0000 mg | ORAL_TABLET | Freq: Every day | ORAL | Status: DC
Start: 2013-01-11 — End: 2013-01-18

## 2013-01-12 ENCOUNTER — Other Ambulatory Visit: Payer: Self-pay

## 2013-01-12 DIAGNOSIS — N400 Enlarged prostate without lower urinary tract symptoms: Secondary | ICD-10-CM

## 2013-01-12 MED ORDER — TERAZOSIN HCL 5 MG PO CAPS: 5.0000 mg | ORAL_CAPSULE | Freq: Every day | ORAL | Status: DC

## 2013-01-14 ENCOUNTER — Other Ambulatory Visit: Payer: Self-pay

## 2013-01-14 DIAGNOSIS — N400 Enlarged prostate without lower urinary tract symptoms: Secondary | ICD-10-CM

## 2013-01-14 MED ORDER — TERAZOSIN HCL 5 MG PO CAPS: 5.0000 mg | ORAL_CAPSULE | Freq: Every day | ORAL | Status: DC

## 2013-01-18 ENCOUNTER — Telehealth: Payer: Self-pay | Admitting: Cardiology

## 2013-01-18 DIAGNOSIS — N400 Enlarged prostate without lower urinary tract symptoms: Secondary | ICD-10-CM

## 2013-01-18 DIAGNOSIS — I259 Chronic ischemic heart disease, unspecified: Secondary | ICD-10-CM

## 2013-01-18 MED ORDER — CLOPIDOGREL BISULFATE 75 MG PO TABS: 75.0000 mg | ORAL_TABLET | Freq: Every day | ORAL | Status: DC

## 2013-01-18 MED ORDER — TERAZOSIN HCL 5 MG PO CAPS: 5.0000 mg | ORAL_CAPSULE | Freq: Every day | ORAL | Status: DC

## 2013-01-18 MED ORDER — VALSARTAN-HYDROCHLOROTHIAZIDE 160-12.5 MG PO TABS: 1.0000 | ORAL_TABLET | Freq: Every day | ORAL | Status: DC

## 2013-01-18 NOTE — Telephone Encounter (Signed)
Pt needs refill of valsartin hctz, clopidogrel, and terazosin called in for 30 days to [facility] and 90 days to [facility], pt out and needs local pharmacy called asap

## 2013-01-18 NOTE — Telephone Encounter (Signed)
All three medications were sent to [facility] last week. I sent 30 day rx with 1 back up refill to [facility].     Micki Riley   CMA

## 2013-01-19 ENCOUNTER — Telehealth: Payer: Self-pay | Admitting: Cardiology

## 2013-01-19 NOTE — Telephone Encounter (Signed)
New Problem:    Patient's wife called in wanting to discuss with you the difficulty receiving her husband's medications.  Please call back.

## 2013-01-19 NOTE — Telephone Encounter (Signed)
Per pt spouse call pt got meds today so no need to rtn a call

## 2013-01-26 ENCOUNTER — Other Ambulatory Visit: Payer: Self-pay | Admitting: Family Medicine

## 2013-01-26 DIAGNOSIS — R109 Unspecified abdominal pain: Secondary | ICD-10-CM

## 2013-01-27 ENCOUNTER — Ambulatory Visit
Admission: RE | Admit: 2013-01-27 | Discharge: 2013-01-27 | Disposition: A | Discharge status: Home / Self Care | Payer: Medicare Other | Source: Ambulatory Visit | Attending: Family Medicine | Admitting: Family Medicine

## 2013-01-27 DIAGNOSIS — R109 Unspecified abdominal pain: Secondary | ICD-10-CM

## 2013-02-08 ENCOUNTER — Telehealth: Payer: Self-pay | Admitting: Cardiology

## 2013-02-08 DIAGNOSIS — E7800 Pure hypercholesterolemia, unspecified: Secondary | ICD-10-CM

## 2013-02-08 MED ORDER — ROSUVASTATIN CALCIUM 5 MG PO TABS: 5.0000 mg | ORAL_TABLET | Freq: Every day | ORAL | Status: DC

## 2013-02-08 NOTE — Telephone Encounter (Signed)
Pt's wife requesting crestor samples

## 2013-02-08 NOTE — Telephone Encounter (Signed)
Will send Rx to [facility] 30 day supply

## 2013-02-08 NOTE — Telephone Encounter (Signed)
New Problem   Pt would like clarification in regards to ordering more Crestor.

## 2013-02-08 NOTE — Telephone Encounter (Signed)
No samples at this time, advised patient  

## 2013-03-01 ENCOUNTER — Telehealth: Payer: Self-pay | Admitting: Cardiology

## 2013-03-01 DIAGNOSIS — E7800 Pure hypercholesterolemia, unspecified: Secondary | ICD-10-CM

## 2013-03-01 MED ORDER — DONEPEZIL HCL 10 MG PO TABS: 10.0000 mg | ORAL_TABLET | Freq: Every day | ORAL | Status: DC

## 2013-03-01 MED ORDER — ROSUVASTATIN CALCIUM 5 MG PO TABS: 5.0000 mg | ORAL_TABLET | Freq: Every day | ORAL | Status: DC

## 2013-03-01 NOTE — Telephone Encounter (Signed)
Spoke with patients wife, no samples. Will send both to [facility]

## 2013-03-01 NOTE — Telephone Encounter (Signed)
New Problem:    Patient's wife called in wantign to know if there were any samples of rosuvastatin (CRESTOR) 5 MG tablet  available for him to have.  Patient also needs to have a refill of his donepezil (ARICEPT) 10 MG tablet sent into [facility].  Please call back.

## 2013-05-14 ENCOUNTER — Encounter: Payer: Self-pay | Admitting: Cardiology

## 2013-05-14 ENCOUNTER — Ambulatory Visit (INDEPENDENT_AMBULATORY_CARE_PROVIDER_SITE_OTHER): Payer: Medicare Other | Admitting: Cardiology

## 2013-05-14 VITALS — BP 152/80 | HR 60 | Ht 67.0 in | Wt 168.0 lb

## 2013-05-14 DIAGNOSIS — E785 Hyperlipidemia, unspecified: Secondary | ICD-10-CM

## 2013-05-14 DIAGNOSIS — I259 Chronic ischemic heart disease, unspecified: Secondary | ICD-10-CM

## 2013-05-14 DIAGNOSIS — E7800 Pure hypercholesterolemia, unspecified: Secondary | ICD-10-CM

## 2013-05-14 DIAGNOSIS — I2510 Atherosclerotic heart disease of native coronary artery without angina pectoris: Secondary | ICD-10-CM

## 2013-05-14 DIAGNOSIS — Z8679 Personal history of other diseases of the circulatory system: Secondary | ICD-10-CM

## 2013-05-14 DIAGNOSIS — K219 Gastro-esophageal reflux disease without esophagitis: Secondary | ICD-10-CM

## 2013-05-14 LAB — HEPATIC FUNCTION PANEL
ALT: 19 U/L (ref 0–53)
AST: 26 U/L (ref 0–37)
Bilirubin, Direct: 0.1 mg/dL (ref 0.0–0.3)
Total Bilirubin: 0.8 mg/dL (ref 0.3–1.2)

## 2013-05-14 LAB — LIPID PANEL: VLDL: 15.4 mg/dL (ref 0.0–40.0)

## 2013-05-14 NOTE — Progress Notes (Signed)
[name] Date of Birth:  06/03/1925 [facility] [address] [place], [place]  [address] [phone]         Fax   [phone]  History of Present Illness: This pleasant 79 year old gentleman is seen for a followup office visit. He has a history of ischemic heart disease. He had angioplasty and stent in 2002. He had another angioplasty in 2003. In November 2009 he had a nuclear stress test which showed no reversible ischemia. On 02/28/11 he had another nuclear stress test as preop clearance for a hip revision procedure and there was no evidence of ischemia but there was an old scar present. Patient has a past history of paroxysmal supraventricular tachycardia. In addition the patient has a history of osteoarthritis. He has had dyslipidemia and is on Crestor. He has had prior bilateral hip replacements. He has had recurrent dislocations of his right hip prosthesis.    Current Outpatient Prescriptions  Medication Sig Dispense Refill  . aspirin EC 81 MG tablet Take 81 mg by mouth daily.      . celecoxib (CELEBREX) 200 MG capsule Take 1 capsule (200 mg total) by mouth daily.  90 capsule  3  . cholecalciferol (VITAMIN D) 1000 UNITS tablet Take 1,000 Units by mouth daily.      . clopidogrel (PLAVIX) 75 MG tablet Take 1 tablet (75 mg total) by mouth daily.  30 tablet  1  . donepezil (ARICEPT) 10 MG tablet Take 1 tablet (10 mg total) by mouth at bedtime.  90 tablet  3  . finasteride (PROSCAR) 5 MG tablet Take 1 tablet (5 mg total) by mouth daily.  90 tablet  3  . fish oil-omega-3 fatty acids 1000 MG capsule Take 1 g by mouth daily.      . metoprolol (LOPRESSOR) 50 MG tablet Take 1 tablet (50 mg total) by mouth 2 (two) times daily.  60 tablet  3  . rosuvastatin (CRESTOR) 5 MG tablet Take 1 tablet (5 mg total) by mouth daily.  90 tablet  3  . terazosin (HYTRIN) 5 MG capsule Take 1 capsule (5 mg total) by mouth at bedtime.  30 capsule  1  . valsartan-hydrochlorothiazide  (DIOVAN HCT) 160-12.5 MG per tablet Take 1 tablet by mouth daily.  30 tablet  1   No current facility-administered medications for this visit.    Allergies  Allergen Reactions  . Prednisone     REACTION: causes tachycardia    Patient Active Problem List   Diagnosis Date Noted  . Hip dislocation 01/31/2012  . Osteoarthritis 08/12/2011  . COUGH 11/03/2008  . TUBULOVILLOUS ADENOMA, COLON 02/25/2008  . HYPERLIPIDEMIA 02/25/2008  . HYPERTENSION 02/25/2008  . MYOCARDIAL INFARCTION 02/25/2008  . CORONARY ARTERY DISEASE 02/25/2008  . GERD 02/25/2008  . DIVERTICULOSIS, COLON 02/25/2008  . DEGENERATIVE JOINT DISEASE, CERVICAL SPINE 02/25/2008  . TRIGGER FINGER 02/25/2008  . HYPERGLYCEMIA 02/25/2008  . CARPAL TUNNEL SYNDROME, HX OF 02/25/2008  . SUPRAVENTRICULAR TACHYCARDIA, HX OF 02/25/2008  . NEPHROLITHIASIS, HX OF 02/25/2008  . BENIGN PROSTATIC HYPERTROPHY, HX OF 02/25/2008  . Unspecified Personal History Presenting Hazards to Health 02/25/2008  . APPENDECTOMY, HX OF 02/25/2008  . PERCUTANEOUS TRANSLUMINAL CORONARY ANGIOPLASTY, HX OF 02/25/2008  . CARPAL TUNNEL RELEASE, RIGHT, HX OF 02/25/2008    History  Smoking status  . Never Smoker   Smokeless tobacco  . Never Used    History  Alcohol Use No    Family History  Problem Relation Age of Onset  .  Heart attack Father   . Stroke Father   . Stroke Mother   . Alzheimer's disease Mother     Review of Systems: Constitutional: no fever chills diaphoresis or fatigue or change in weight.  Head and neck: no hearing loss, no epistaxis, no photophobia or visual disturbance. Respiratory: No cough, shortness of breath or wheezing. Cardiovascular: No chest pain peripheral edema, palpitations. Gastrointestinal: No abdominal distention, no abdominal pain, no change in bowel habits hematochezia or melena. Genitourinary: No dysuria, no frequency, no urgency, no nocturia. Musculoskeletal:No arthralgias, no back pain, no gait  disturbance or myalgias. Neurological: No dizziness, no headaches, no numbness, no seizures, no syncope, no weakness, no tremors. Hematologic: No lymphadenopathy, no easy bruising. Psychiatric: No confusion, no hallucinations, no sleep disturbance.    Physical Exam: Filed Vitals:   05/14/13 0832  BP: 152/80  Pulse: 60   the general appearance reveals a well-developed well-nourished gentleman in no distress.  He appears younger than his stated age.The head and neck exam reveals pupils equal and reactive.  Extraocular movements are full.  There is no scleral icterus.  The mouth and pharynx are normal.  The neck is supple.  The carotids reveal no bruits.  The jugular venous pressure is normal.  The  thyroid is not enlarged.  There is no lymphadenopathy.  The chest is clear to percussion and auscultation.  There are no rales or rhonchi.  Expansion of the chest is symmetrical.  The precordium is quiet.  The first heart sound is normal.  The second heart sound is physiologically split.  There is no murmur gallop rub or click.  There is no abnormal lift or heave.  The abdomen is soft and nontender.  The bowel sounds are normal.  The liver and spleen are not enlarged.  There are no abdominal masses.  There are no abdominal bruits.  Extremities reveal good pedal pulses.  There is no phlebitis or edema.  There is no cyanosis or clubbing.  Strength is normal and symmetrical in all extremities.  There is no lateralizing weakness.  There are no sensory deficits.  The skin is warm and dry.  There is no rash.    Assessment / Plan: Continue on same medication.  Blood work today pending.  Recheck 6 months for office visit EKG lipid panel hepatic function panel and basal metabolic panel.

## 2013-05-14 NOTE — Assessment & Plan Note (Signed)
The patient has not had any recurrent chest pain or angina pectoris.  He has not had to take any recent nitroglycerin.  He stays extremely busy.  He volunteers at his church on an almost daily basis.  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  He has not been able to train him he has a younger deacons to take over any of the work.  Fortunately he still has good stamina and the ability to work hard.

## 2013-05-14 NOTE — Assessment & Plan Note (Signed)
The patient is on Crestor 5 mg daily.  Is not having any myalgias or obvious side effects from the statin therapy.

## 2013-05-14 NOTE — Assessment & Plan Note (Signed)
The patient has not been having any significant symptoms of GERD recently

## 2013-05-14 NOTE — Progress Notes (Signed)
Quick Note:  Please report to patient. The recent labs are stable. Continue same medication and careful diet. (BMET is pending?) ______

## 2013-05-14 NOTE — Assessment & Plan Note (Signed)
The patient has occasional episodes of SVT.  These are usually triggered by overeating especially at breakfast.  The episodes of SVT are abolished by burping or by drinking ice cold water.

## 2013-05-14 NOTE — Patient Instructions (Addendum)
Will obtain labs today and call you with the results (lp/bmet/hfp)  Your physician recommends that you continue on your current medications as directed. Please refer to the Current Medication list given to you today.  Your physician wants you to follow-up in: 6 months with fasting labs (lp/bmet/hfp)  You will receive a reminder letter in the mail two months in advance. If you don't receive a letter, please call our office to schedule the follow-up appointment.  

## 2013-05-17 ENCOUNTER — Ambulatory Visit (INDEPENDENT_AMBULATORY_CARE_PROVIDER_SITE_OTHER): Payer: Medicare Other | Admitting: Cardiology

## 2013-05-17 DIAGNOSIS — Z79899 Other long term (current) drug therapy: Secondary | ICD-10-CM

## 2013-05-17 LAB — BASIC METABOLIC PANEL
Chloride: 106 mEq/L (ref 96–112)
Creatinine, Ser: 1.5 mg/dL (ref 0.4–1.5)
Potassium: 5 mEq/L (ref 3.5–5.1)
Sodium: 141 mEq/L (ref 135–145)

## 2013-05-17 NOTE — Progress Notes (Signed)
Quick Note:  Please report to patient. The recent labs are stable. Continue same medication and careful diet. ______ 

## 2013-05-18 ENCOUNTER — Telehealth: Payer: Self-pay | Admitting: *Deleted

## 2013-05-18 NOTE — Telephone Encounter (Signed)
Message copied by Burnell Blanks on Tue May 18, 2013 10:20 AM ------      Message from: Cassell Clement      Created: Mon May 17, 2013  5:22 PM       Please report to patient.  The recent labs are stable. Continue same medication and careful diet. ------

## 2013-05-18 NOTE — Telephone Encounter (Signed)
Message copied by Burnell Blanks on Tue May 18, 2013 10:20 AM ------      Message from: Cassell Clement      Created: Fri May 14, 2013  6:57 PM       Please report to patient.  The recent labs are stable. Continue same medication and careful diet. (BMET is pending?) ------

## 2013-05-18 NOTE — Telephone Encounter (Signed)
Advised patient of lab results  

## 2013-05-20 ENCOUNTER — Other Ambulatory Visit: Payer: Self-pay | Admitting: *Deleted

## 2013-05-20 MED ORDER — VALSARTAN-HYDROCHLOROTHIAZIDE 160-12.5 MG PO TABS: 1.0000 | ORAL_TABLET | Freq: Every day | ORAL | Status: DC

## 2013-06-30 ENCOUNTER — Other Ambulatory Visit: Payer: Self-pay | Admitting: Urology

## 2013-06-30 ENCOUNTER — Ambulatory Visit
Admission: RE | Admit: 2013-06-30 | Discharge: 2013-06-30 | Disposition: A | Discharge status: Home / Self Care | Payer: Medicare Other | Source: Ambulatory Visit | Attending: Urology | Admitting: Urology

## 2013-06-30 DIAGNOSIS — Z87442 Personal history of urinary calculi: Secondary | ICD-10-CM

## 2013-06-30 DIAGNOSIS — N200 Calculus of kidney: Secondary | ICD-10-CM

## 2013-06-30 HISTORY — DX: Calculus of kidney: N20.0

## 2013-07-07 ENCOUNTER — Other Ambulatory Visit: Payer: Self-pay | Admitting: *Deleted

## 2013-07-07 DIAGNOSIS — N400 Enlarged prostate without lower urinary tract symptoms: Secondary | ICD-10-CM

## 2013-07-07 MED ORDER — FINASTERIDE 5 MG PO TABS: 5.0000 mg | ORAL_TABLET | Freq: Every day | ORAL | Status: DC

## 2013-08-18 ENCOUNTER — Telehealth: Payer: Self-pay | Admitting: *Deleted

## 2013-08-18 ENCOUNTER — Encounter: Payer: Self-pay | Admitting: Internal Medicine

## 2013-08-18 NOTE — Telephone Encounter (Signed)
Left a message for patient to call me(to move up OV as per Dr. Juanda Chance).

## 2013-08-19 ENCOUNTER — Encounter: Payer: Self-pay | Admitting: *Deleted

## 2013-08-19 NOTE — Telephone Encounter (Signed)
Spoke with patient and moved appointment to 08/31/13 at 8:45 AM.

## 2013-08-30 ENCOUNTER — Telehealth: Payer: Self-pay | Admitting: *Deleted

## 2013-08-30 MED ORDER — CELECOXIB 200 MG PO CAPS: 200.0000 mg | ORAL_CAPSULE | Freq: Every day | ORAL | Status: DC

## 2013-08-30 NOTE — Telephone Encounter (Signed)
Left message for patient script sent for mail order

## 2013-08-31 ENCOUNTER — Encounter: Payer: Self-pay | Admitting: Internal Medicine

## 2013-08-31 ENCOUNTER — Ambulatory Visit (INDEPENDENT_AMBULATORY_CARE_PROVIDER_SITE_OTHER): Payer: Medicare Other | Admitting: Internal Medicine

## 2013-08-31 VITALS — BP 120/70 | HR 48 | Ht 67.0 in | Wt 166.2 lb

## 2013-08-31 DIAGNOSIS — K219 Gastro-esophageal reflux disease without esophagitis: Secondary | ICD-10-CM

## 2013-08-31 DIAGNOSIS — R195 Other fecal abnormalities: Secondary | ICD-10-CM

## 2013-08-31 NOTE — Progress Notes (Signed)
COBIN CADAVID 1925-06-17 MRN [id number]   History of Present Illness:  This is an 79 year old white male who was seen today at the request of his daughter [name] to evaluate gastroesophageal reflux. He has  known hiatal hernia and reflux dating back to 2002. He was given Prilosec by Dr Jeannetta Nap but could not take it because of diarrhea. In recent days, patient purchased Famotidine 20 mg which has completely controlled his symptoms. He has no complaints today. He was having occasional dysphagia. We have see him in the past for a screening colonoscopy which was done in 2002 and again in 2008. He had a tubular adenoma in 2002 but no polyps in 2008. He denies any rectal bleeding or change in bowel habits. Patient has been on Plavix and aspirin and has a history of SVT and coronary artery disease with stent placement in 2001. An upper abdominal ultrasound in February 2014 showed renal cysts and a normal common bile duct and gallbladder.   Past Medical History  Diagnosis Date  . Coronary artery disease   . Myocardial infarction     Previous anterolateral myocardial infarction with presistent ST-T wave changes  . Left hip pain     Chronic left hip discomfort  . 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 Tachycardia     History of paroxysmal supraventricular tachycardia  . Hypercholesterolemia   . History of BPH   . Osteoarthritis   . Hypertension   . Diverticulosis   . SVT (supraventricular tachycardia)   . Tubular adenoma   . Carpal tunnel syndrome   . BPH (benign prostatic hyperplasia)   . IBS (irritable bowel syndrome)   . Kidney stone    Past Surgical History  Procedure Laterality Date  . Cardiac catheterization  05/07/2002    Ejection fraction 20%  . Angioplasty  2002    with stent and angioplasty in 2003  . Hip surgery    . Hip closed reduction  01/31/2012    Procedure: CLOSED MANIPULATION HIP;  Surgeon: Loanne Drilling, MD;  Location:  [facility];  Service: Orthopedics;  Laterality: Right;  closed reduction right dislocated hip  . Appendectomy    . Tonsillectomy    . Hip closed reduction  10/10/2012    Procedure: CLOSED MANIPULATION HIP;  Surgeon: Eugenia Mcalpine, MD;  Location: [facility];  Service: Orthopedics;  Laterality: Right;  . Transurethral resection of prostate    . Back surgery    . Finger surgery      reports that he has never smoked. He has never used smokeless tobacco. He reports that he does not drink alcohol or use illicit drugs. family history includes Alzheimer's disease in his mother; Heart attack in his father; Stroke in his father and mother. Allergies  Allergen Reactions  . Prednisone     REACTION: causes tachycardia        Review of Systems: Currently denies heartburn indigestion abdominal pain  The remainder of the 10 point ROS is negative except as outlined in H&P   Physical Exam: General appearance  Well developed, in no distress. Eyes- non icteric. HEENT nontraumatic, normocephalic. Mouth no lesions, tongue papillated, no cheilosis. Neck supple without adenopathy, thyroid not enlarged, no carotid bruits, no JVD. Lungs Clear to auscultation bilaterally. Cor normal S1, normal S2, regular rhythm, no murmur,  quiet precordium. Abdomen: Soft nontender abdomen with normoactive bowel sounds. Liver edge at costal margin. Rectal: Soft trace positive stool. Extremities 1+ pedal  edema. Skin no lesions. Neurological alert and oriented x 3. Psychological normal mood and affect.  Assessment and Plan:  Problem #58 79 year old white male with a history of gastroesophageal reflux. He could not take Prilosec because of side effects. He is now taking over-the-counter famotidine which is controlling his symptoms. He prefers not to have an upper endoscopy.He is an increased risk for sedation given his age and cardiac hx. He is on Plavix. Continue famotidine and  return when necessary  Problem #2 Hemoccult  positive stool on today's exam. This is likely due to Plavix. He is up-to-date on his colonoscopy. . Problem #3 Coronary artery disease and history of SVT. He is on long-term Plavix, Diovan and Lopressor.   08/31/2013 Lina Sar

## 2013-08-31 NOTE — Patient Instructions (Addendum)
Dr W.Elkins, Dr Patty Sermons

## 2013-09-09 ENCOUNTER — Other Ambulatory Visit: Payer: Self-pay

## 2013-09-09 DIAGNOSIS — I119 Hypertensive heart disease without heart failure: Secondary | ICD-10-CM

## 2013-09-09 MED ORDER — METOPROLOL TARTRATE 50 MG PO TABS: 50.0000 mg | ORAL_TABLET | Freq: Two times a day (BID) | ORAL | Status: DC

## 2013-09-14 ENCOUNTER — Other Ambulatory Visit: Payer: Self-pay

## 2013-09-14 DIAGNOSIS — I119 Hypertensive heart disease without heart failure: Secondary | ICD-10-CM

## 2013-09-14 MED ORDER — METOPROLOL TARTRATE 50 MG PO TABS: 50.0000 mg | ORAL_TABLET | Freq: Two times a day (BID) | ORAL | Status: DC

## 2013-09-17 ENCOUNTER — Ambulatory Visit: Payer: Medicare Other | Admitting: Internal Medicine

## 2013-10-08 ENCOUNTER — Telehealth: Payer: Self-pay | Admitting: Cardiology

## 2013-10-08 NOTE — Telephone Encounter (Signed)
Left message, no samples. 

## 2013-10-08 NOTE — Telephone Encounter (Signed)
New problem     Pt called about crestor samples.  None at this time.    Pt's wife will call back next week.

## 2013-10-28 ENCOUNTER — Telehealth: Payer: Self-pay | Admitting: Cardiology

## 2013-10-28 MED ORDER — MEMANTINE HCL 28 X 5 MG & 21 X 10 MG PO TABS: ORAL_TABLET | ORAL | Status: DC

## 2013-10-28 NOTE — Telephone Encounter (Signed)
Namenda comes as a starter pack over 4 weeks. Starts at 5 mg daily for the first week, then 5 mg BID for a week. Then 10 mg in am and 5 mg in pm then 10 mg BID. I believe it may be generic now. Brand name is expensive.

## 2013-10-28 NOTE — Telephone Encounter (Signed)
Pt's wife called because she states Dr. Patty Sermons on the last office visit recommended for pt to take a medication name  "Numenda" and if he decided to take it to call [place]. Pt's wife does not know the milligrams. Wife is aware that this message will be send to MD for recommendation.

## 2013-10-28 NOTE — Telephone Encounter (Signed)
Namenda medication starter pack  Over 4 weeks, send to [facility]. Pt is aware to take  Starting with 5 mg daily for the first week, then 5 mg BID for a week. Then 10 mg in am and 5 mg in PM for 1 week ;  then 10 mg BID. Order sent to [facility] pt and wife aware.

## 2013-10-28 NOTE — Telephone Encounter (Signed)
New Problem:  Pt's wife states she is calling about Numenda.. She states Dr. Patty Sermons had mentioned it to them before and she was wondering if he would call in a new prescription of Numenda to  [facility]. Please advise

## 2013-11-10 ENCOUNTER — Encounter: Payer: Self-pay | Admitting: Cardiology

## 2013-11-10 ENCOUNTER — Ambulatory Visit (INDEPENDENT_AMBULATORY_CARE_PROVIDER_SITE_OTHER): Payer: Medicare Other | Admitting: Cardiology

## 2013-11-10 VITALS — BP 150/80 | HR 46 | Ht 67.0 in | Wt 168.8 lb

## 2013-11-10 DIAGNOSIS — Z8679 Personal history of other diseases of the circulatory system: Secondary | ICD-10-CM

## 2013-11-10 DIAGNOSIS — E7800 Pure hypercholesterolemia, unspecified: Secondary | ICD-10-CM

## 2013-11-10 DIAGNOSIS — I2510 Atherosclerotic heart disease of native coronary artery without angina pectoris: Secondary | ICD-10-CM

## 2013-11-10 DIAGNOSIS — E785 Hyperlipidemia, unspecified: Secondary | ICD-10-CM

## 2013-11-10 DIAGNOSIS — I259 Chronic ischemic heart disease, unspecified: Secondary | ICD-10-CM

## 2013-11-10 DIAGNOSIS — I10 Essential (primary) hypertension: Secondary | ICD-10-CM

## 2013-11-10 LAB — LIPID PANEL
Cholesterol: 151 mg/dL (ref 0–200)
HDL: 50.2 mg/dL (ref >39.00)
Total CHOL/HDL Ratio: 3
Triglycerides: 59 mg/dL (ref 0.0–149.0)
VLDL: 11.8 mg/dL (ref 0.0–40.0)

## 2013-11-10 LAB — HEPATIC FUNCTION PANEL
AST: 28 U/L (ref 0–37)
Albumin: 3.8 g/dL (ref 3.5–5.2)
Alkaline Phosphatase: 80 U/L (ref 39–117)
Total Bilirubin: 0.8 mg/dL (ref 0.3–1.2)

## 2013-11-10 LAB — BASIC METABOLIC PANEL
CO2: 30 mEq/L (ref 19–32)
Calcium: 9.2 mg/dL (ref 8.4–10.5)
Creatinine, Ser: 1.5 mg/dL (ref 0.4–1.5)
GFR: 48.66 mL/min — ABNORMAL LOW (ref >60.00)
Glucose, Bld: 98 mg/dL (ref 70–99)
Potassium: 4.6 mEq/L (ref 3.5–5.1)
Sodium: 137 mEq/L (ref 135–145)

## 2013-11-10 NOTE — Progress Notes (Signed)
[name] Date of Birth:  [birth year] [address] [place], [place]  [address] [phone]         Fax   [phone]  History of Present Illness: This pleasant 79 year old gentleman is seen for a followup office visit. He has a history of ischemic heart disease. He had angioplasty and stent in 2002. He had another angioplasty in 2003. In November 2009 he had a nuclear stress test which showed no reversible ischemia. On 02/28/11 he had another nuclear stress test as preop clearance for a hip revision procedure and there was no evidence of ischemia but there was an old scar present. Patient has a past history of paroxysmal supraventricular tachycardia. In addition the patient has a history of osteoarthritis. He has had dyslipidemia and is on Crestor. He has had prior bilateral hip replacements. He has had recurrent dislocations of his right hip prosthesis.    Current Outpatient Prescriptions  Medication Sig Dispense Refill  . aspirin EC 81 MG tablet Take 81 mg by mouth daily.      . celecoxib (CELEBREX) 200 MG capsule Take 1 capsule (200 mg total) by mouth daily.  90 capsule  3  . cholecalciferol (VITAMIN D) 1000 UNITS tablet Take 1,000 Units by mouth daily.      . clopidogrel (PLAVIX) 75 MG tablet Take 1 tablet (75 mg total) by mouth daily.  30 tablet  1  . donepezil (ARICEPT) 10 MG tablet Take 1 tablet (10 mg total) by mouth at bedtime.  90 tablet  3  . finasteride (PROSCAR) 5 MG tablet Take 1 tablet (5 mg total) by mouth daily.  90 tablet  3  . fish oil-omega-3 fatty acids 1000 MG capsule Take 1 g by mouth daily.      . metoprolol (LOPRESSOR) 50 MG tablet Take 1 tablet (50 mg total) by mouth 2 (two) times daily.  180 tablet  3  . NON FORMULARY famodine 20 mg prn      . omeprazole (PRILOSEC) 20 MG capsule       . rosuvastatin (CRESTOR) 5 MG tablet Take 1 tablet (5 mg total) by mouth daily.  90 tablet  3  . terazosin (HYTRIN) 5 MG capsule Take 1 capsule (5 mg total)  by mouth at bedtime.  30 capsule  1  . valsartan-hydrochlorothiazide (DIOVAN HCT) 160-12.5 MG per tablet Take 1 tablet by mouth daily.  30 tablet  3   No current facility-administered medications for this visit.    Allergies  Allergen Reactions  . Prednisone     REACTION: causes tachycardia  . Prilosec [Omeprazole]     Diarrhea, stomach cramps    Patient Active Problem List   Diagnosis Date Noted  . Hip dislocation 01/31/2012  . Osteoarthritis 08/12/2011  . COUGH 11/03/2008  . TUBULOVILLOUS ADENOMA, COLON 02/25/2008  . HYPERLIPIDEMIA 02/25/2008  . HYPERTENSION 02/25/2008  . MYOCARDIAL INFARCTION 02/25/2008  . CORONARY ARTERY DISEASE 02/25/2008  . GERD 02/25/2008  . DIVERTICULOSIS, COLON 02/25/2008  . DEGENERATIVE JOINT DISEASE, CERVICAL SPINE 02/25/2008  . TRIGGER FINGER 02/25/2008  . HYPERGLYCEMIA 02/25/2008  . CARPAL TUNNEL SYNDROME, HX OF 02/25/2008  . SUPRAVENTRICULAR TACHYCARDIA, HX OF 02/25/2008  . NEPHROLITHIASIS, HX OF 02/25/2008  . BENIGN PROSTATIC HYPERTROPHY, HX OF 02/25/2008  . Unspecified Personal History Presenting Hazards to Health 02/25/2008  . APPENDECTOMY, HX OF 02/25/2008  . PERCUTANEOUS TRANSLUMINAL CORONARY ANGIOPLASTY, HX OF 02/25/2008  . CARPAL TUNNEL RELEASE, RIGHT, HX OF 02/25/2008    History  Smoking  status  . Never Smoker   Smokeless tobacco  . Never Used    History  Alcohol Use No    Family History  Problem Relation Age of Onset  . Heart attack Father   . Stroke Father   . Stroke Mother   . Alzheimer's disease Mother     Review of Systems: Constitutional: no fever chills diaphoresis or fatigue or change in weight.  Head and neck: no hearing loss, no epistaxis, no photophobia or visual disturbance. Respiratory: No cough, shortness of breath or wheezing. Cardiovascular: No chest pain peripheral edema, palpitations. Gastrointestinal: No abdominal distention, no abdominal pain, no change in bowel habits hematochezia or  melena. Genitourinary: No dysuria, no frequency, no urgency, no nocturia. Musculoskeletal:No arthralgias, no back pain, no gait disturbance or myalgias. Neurological: No dizziness, no headaches, no numbness, no seizures, no syncope, no weakness, no tremors. Hematologic: No lymphadenopathy, no easy bruising. Psychiatric: No confusion, no hallucinations, no sleep disturbance.    Physical Exam: Filed Vitals:   11/10/13 0915  BP: 150/80  Pulse:    the general appearance reveals a well-developed well-nourished gentleman in no distress.  He appears younger than his stated age.The head and neck exam reveals pupils equal and reactive.  Extraocular movements are full.  There is no scleral icterus.  The mouth and pharynx are normal.  The neck is supple.  The carotids reveal no bruits.  The jugular venous pressure is normal.  The  thyroid is not enlarged.  There is no lymphadenopathy.  The chest is clear to percussion and auscultation.  There are no rales or rhonchi.  Expansion of the chest is symmetrical.  The precordium is quiet.  The first heart sound is normal.  The second heart sound is physiologically split.  There is no murmur gallop rub or click.  There is no abnormal lift or heave.  The abdomen is soft and nontender.  The bowel sounds are normal.  The liver and spleen are not enlarged.  There are no abdominal masses.  There are no abdominal bruits.  Extremities reveal good pedal pulses.  There is no phlebitis or edema.  There is no cyanosis or clubbing.  Strength is normal and symmetrical in all extremities.  There is no lateralizing weakness.  There are no sensory deficits.  The skin is warm and dry.  There is no rash.  EKG today shows marked sinus bradycardia and lateral T-wave changes unchanged since 11/10/12  Assessment / Plan: Continue on same medication.  Blood work today pending.  Recheck 6 months for office visit EKG lipid panel hepatic function panel and basal metabolic panel.

## 2013-11-10 NOTE — Assessment & Plan Note (Signed)
The patient has a history of hyperlipidemia and is on Crestor.  He denies any myalgias or side effects.

## 2013-11-10 NOTE — Progress Notes (Signed)
Quick Note:  Please report to patient. The recent labs are stable. Continue same medication and careful diet. ______ 

## 2013-11-10 NOTE — Assessment & Plan Note (Signed)
The patient continues to have occasional episodes of SVT usually brought on by over 80 and relieved by drinking some baking soda or cold water.  If he can belch, the SVT is aborted.

## 2013-11-10 NOTE — Patient Instructions (Addendum)
Your physician wants you to follow-up in: in 6 months with fasting labs. You will receive a reminder letter in the mail two months in advance. If you don't receive a letter, please call our office to schedule the follow-up appointment.   Your physician recommends that you continue on your current medications as directed. Please refer to the Current Medication list given to you today.

## 2013-11-10 NOTE — Assessment & Plan Note (Signed)
The patient has not been experiencing any exertional chest pain or angina pectoris.

## 2014-01-10 ENCOUNTER — Other Ambulatory Visit: Payer: Self-pay | Admitting: *Deleted

## 2014-01-10 DIAGNOSIS — I119 Hypertensive heart disease without heart failure: Secondary | ICD-10-CM

## 2014-01-10 MED ORDER — METOPROLOL TARTRATE 50 MG PO TABS
50.0000 mg | ORAL_TABLET | Freq: Two times a day (BID) | ORAL | Status: DC
Start: 2014-01-10 — End: 2014-04-18

## 2014-02-04 ENCOUNTER — Telehealth: Payer: Self-pay | Admitting: *Deleted

## 2014-02-04 ENCOUNTER — Other Ambulatory Visit: Payer: Self-pay | Admitting: *Deleted

## 2014-02-04 DIAGNOSIS — I259 Chronic ischemic heart disease, unspecified: Secondary | ICD-10-CM

## 2014-02-04 DIAGNOSIS — N400 Enlarged prostate without lower urinary tract symptoms: Secondary | ICD-10-CM

## 2014-02-04 MED ORDER — CLOPIDOGREL BISULFATE 75 MG PO TABS: 75.0000 mg | ORAL_TABLET | Freq: Every day | ORAL | Status: DC

## 2014-02-04 MED ORDER — FINASTERIDE 5 MG PO TABS: 5.0000 mg | ORAL_TABLET | Freq: Every day | ORAL | Status: DC

## 2014-02-04 MED ORDER — VALSARTAN-HYDROCHLOROTHIAZIDE 160-12.5 MG PO TABS: 1.0000 | ORAL_TABLET | Freq: Every day | ORAL | Status: DC

## 2014-02-04 NOTE — Telephone Encounter (Signed)
Patients wife requests finasteride refill for patient to be sent to [facility]. Thanks, MI

## 2014-02-04 NOTE — Telephone Encounter (Signed)
Refilled as requested  

## 2014-02-09 ENCOUNTER — Telehealth: Payer: Self-pay

## 2014-02-09 NOTE — Telephone Encounter (Signed)
Patient came to office to pick up samples of crestor gave them to him at front desk

## 2014-02-28 ENCOUNTER — Other Ambulatory Visit: Payer: Self-pay

## 2014-02-28 DIAGNOSIS — N400 Enlarged prostate without lower urinary tract symptoms: Secondary | ICD-10-CM

## 2014-02-28 MED ORDER — TERAZOSIN HCL 5 MG PO CAPS: 5.0000 mg | ORAL_CAPSULE | Freq: Every day | ORAL | Status: DC

## 2014-03-14 ENCOUNTER — Telehealth: Payer: Self-pay | Admitting: *Deleted

## 2014-03-14 NOTE — Telephone Encounter (Signed)
Crestor samples placed at front per patients wife request.

## 2014-04-12 ENCOUNTER — Telehealth: Payer: Self-pay | Admitting: *Deleted

## 2014-04-12 NOTE — Telephone Encounter (Signed)
Patient requests crestor samples. I will place at the front for pick up.

## 2014-04-15 ENCOUNTER — Other Ambulatory Visit: Payer: Self-pay | Admitting: *Deleted

## 2014-04-15 MED ORDER — DONEPEZIL HCL 10 MG PO TABS: 10.0000 mg | ORAL_TABLET | Freq: Every day | ORAL | Status: DC

## 2014-04-18 ENCOUNTER — Other Ambulatory Visit: Payer: Self-pay | Admitting: *Deleted

## 2014-04-18 DIAGNOSIS — I119 Hypertensive heart disease without heart failure: Secondary | ICD-10-CM

## 2014-04-18 MED ORDER — METOPROLOL TARTRATE 50 MG PO TABS: 50.0000 mg | ORAL_TABLET | Freq: Two times a day (BID) | ORAL | Status: DC

## 2014-04-28 ENCOUNTER — Telehealth: Payer: Self-pay

## 2014-04-28 NOTE — Telephone Encounter (Signed)
Patient's wife called to get samples of crestor 5 mg placed samples up front

## 2014-05-14 ENCOUNTER — Emergency Department (HOSPITAL_COMMUNITY)
Admission: EM | Admit: 2014-05-14 | Discharge: 2014-05-14 | Disposition: A | Discharge status: Home / Self Care | Payer: Medicare Other | Attending: Emergency Medicine | Admitting: Emergency Medicine

## 2014-05-14 ENCOUNTER — Encounter (HOSPITAL_COMMUNITY): Payer: Self-pay | Admitting: Emergency Medicine

## 2014-05-14 ENCOUNTER — Emergency Department (HOSPITAL_COMMUNITY): Payer: Medicare Other

## 2014-05-14 DIAGNOSIS — Z7902 Long term (current) use of antithrombotics/antiplatelets: Secondary | ICD-10-CM | POA: Insufficient documentation

## 2014-05-14 DIAGNOSIS — T84099A Other mechanical complication of unspecified internal joint prosthesis, initial encounter: Secondary | ICD-10-CM | POA: Insufficient documentation

## 2014-05-14 DIAGNOSIS — Z79899 Other long term (current) drug therapy: Secondary | ICD-10-CM | POA: Insufficient documentation

## 2014-05-14 DIAGNOSIS — I252 Old myocardial infarction: Secondary | ICD-10-CM | POA: Insufficient documentation

## 2014-05-14 DIAGNOSIS — I2510 Atherosclerotic heart disease of native coronary artery without angina pectoris: Secondary | ICD-10-CM | POA: Insufficient documentation

## 2014-05-14 DIAGNOSIS — Z87442 Personal history of urinary calculi: Secondary | ICD-10-CM | POA: Insufficient documentation

## 2014-05-14 DIAGNOSIS — Z96649 Presence of unspecified artificial hip joint: Secondary | ICD-10-CM | POA: Insufficient documentation

## 2014-05-14 DIAGNOSIS — Z9889 Other specified postprocedural states: Secondary | ICD-10-CM | POA: Insufficient documentation

## 2014-05-14 DIAGNOSIS — S73004A Unspecified dislocation of right hip, initial encounter: Secondary | ICD-10-CM

## 2014-05-14 DIAGNOSIS — N400 Enlarged prostate without lower urinary tract symptoms: Secondary | ICD-10-CM | POA: Insufficient documentation

## 2014-05-14 DIAGNOSIS — I10 Essential (primary) hypertension: Secondary | ICD-10-CM | POA: Insufficient documentation

## 2014-05-14 DIAGNOSIS — E7800 Pure hypercholesterolemia, unspecified: Secondary | ICD-10-CM | POA: Insufficient documentation

## 2014-05-14 DIAGNOSIS — Y831 Surgical operation with implant of artificial internal device as the cause of abnormal reaction of the patient, or of later complication, without mention of misadventure at the time of the procedure: Secondary | ICD-10-CM | POA: Insufficient documentation

## 2014-05-14 DIAGNOSIS — Z7982 Long term (current) use of aspirin: Secondary | ICD-10-CM | POA: Insufficient documentation

## 2014-05-14 DIAGNOSIS — M1990 Unspecified osteoarthritis, unspecified site: Secondary | ICD-10-CM | POA: Insufficient documentation

## 2014-05-14 DIAGNOSIS — I498 Other specified cardiac arrhythmias: Secondary | ICD-10-CM | POA: Insufficient documentation

## 2014-05-14 DIAGNOSIS — Z8719 Personal history of other diseases of the digestive system: Secondary | ICD-10-CM | POA: Insufficient documentation

## 2014-05-14 MED ORDER — PROPOFOL 10 MG/ML IV BOLUS
1.0000 mg/kg | Freq: Once | INTRAVENOUS | Status: DC
  Filled 2014-05-14: qty 1

## 2014-05-14 MED ORDER — PROPOFOL 10 MG/ML IV BOLUS
INTRAVENOUS | Status: DC | PRN
  Administered 2014-05-14: 35 mg via INTRAVENOUS
  Administered 2014-05-14: 20 mg via INTRAVENOUS

## 2014-05-14 MED ORDER — SODIUM CHLORIDE 0.9 % IV BOLUS (SEPSIS)
500.0000 mL | Freq: Once | INTRAVENOUS | Status: AC
  Administered 2014-05-14: 500 mL via INTRAVENOUS

## 2014-05-14 NOTE — ED Provider Notes (Signed)
I saw and evaluated the patient, reviewed the resident's note and I agree with the findings and plan.  Procedural sedation Performed by: Hoy Morn Consent: Verbal consent obtained. Risks and benefits: risks, benefits and alternatives were discussed Required items: required blood products, implants, devices, and special equipment available Patient identity confirmed: arm band and provided demographic data Time out: Immediately prior to procedure a "time out" was called to verify the correct patient, procedure, equipment, support staff and site/side marked as required. Sedation type: moderate (conscious) sedation NPO time confirmed and considered Sedatives: PROPOFOL Physician Time at Bedside: 60 Vitals: Vital signs were monitored during sedation. Cardiac Monitor, pulse oximeter Patient tolerance: Patient tolerated the procedure well with no immediate complications. Comments: Pt with uneventful recovery. Returned to pre-procedural sedation baseline   Reduction of dislocation Date/Time: 05/16/2013  Performed by: Hoy Morn Authorized by: Hoy Morn Consent: Verbal consent obtained. Risks and benefits: risks, benefits and alternatives were discussed Consent given by: patient Required items: required blood products, implants, devices, and special equipment available Time out: Immediately prior to procedure a "time out" was called to verify the correct patient, procedure, equipment, support staff and site/side marked as required. Patient sedated: Propofol Vitals: Vital signs were monitored during sedation. Patient tolerance: Patient tolerated the procedure well with no immediate complications. Joint: Right prosthetic hip Reduction technique: Manual reduction with flexion, internal and external rotation and traction     Dg Hip 1 View Right 05/14/2014   CLINICAL DATA:  Previous right hip dislocation  EXAM: RIGHT HIP - 1 VIEW  COMPARISON:  05/14/2014  FINDINGS: The right hip  prosthesis has been relocated into the acetabular component. No acute bony abnormality is seen.   Electronically Signed   By: Inez Catalina M.D.   On: 05/14/2014 09:00   Dg Hip Complete Right 05/14/2014   CLINICAL DATA:  Right hip pain  EXAM: RIGHT HIP - COMPLETE 2+ VIEW  COMPARISON:  10/10/2012  FINDINGS: The patient has bilateral total hip arthroplasty device. There has been superior right hip dislocation. The left hip appears located. Degenerative disc disease is noted within the lower lumbar spine.  IMPRESSION: 1. Recurrent superior dislocation of right hip.   Electronically Signed   By: Kerby Moors M.D.   On: 05/14/2014 07:54   I personally reviewed the imaging tests through PACS system I reviewed available ER/hospitalization records through the EMR   Tolerated procedure and sedation well.  Home in a knee immobilizer with orthopedic followup  Hoy Morn, MD 05/14/14 1416

## 2014-05-14 NOTE — ED Notes (Signed)
Per EMS, pt.from home with complaint  With right hip pain and  felt that his right hip popped while digging his toenails around 06am this morning.pt. Claimed of pain at 5/10 , had right hip replaced several years ago. Alert and oriented x3.

## 2014-05-14 NOTE — Discharge Instructions (Signed)
Your hip dislocation was reduced in the Emergency Department. You are in a knee immobilizer which you should keep on until you see your Orthopedic doctor. You should call tomorrow and make an appt with your orthopedic doctor in about 1 week. Seek immediate care if you develop leg weakness, numbness, or other new concerns.   Hip Dislocation A hip dislocation happens when your thigh bone (the ball) separates from your hip bone (the socket). Hip dislocation is an emergency. If you think you have a hip dislocation and cannot move your leg, get help right away. Do not try to move.  Your doctor will put your thigh bone back in the joint (the ball back in the socket). Sometimes this can be done without surgery. Surgery may be needed if blood vessels or nerves are damaged, or if the ball cannot be put back into the socket by hand. HOME CARE  Rest your injured joint. Do not move it.  Avoid the activity that caused your injury.  Put ice on your injured joint for 1 to 2 days or as told by your doctor.  Put ice in a plastic bag.  Place a towel between your skin and the bag.  Leave the ice on for 15 to 20 minutes, every 2 hours while you are awake.  Use crutches or a walker as told by your doctor.  Exercise your hip and leg as told by your doctor.  Only take medicines as told by your doctor. GET HELP RIGHT AWAY IF:  Your pain gets worse, not better.  You feel like your hip has dislocated again. MAKE SURE YOU:  Understand these instructions.  Will watch your condition.  Will get help right away if you are not doing well or get worse. Document Released: 07/31/2011 Document Revised: 02/24/2012 Document Reviewed: 07/31/2011 [facility] Patient Information 2014 [place], [place].

## 2014-05-14 NOTE — ED Provider Notes (Signed)
CSN: [id number]     Arrival date & time 05/14/14  0707 History   First MD Initiated Contact with Patient 05/14/14 0710     Chief Complaint  Patient presents with  . right hip pain   . hip popped out     right    HPI  80 y.o. male with h/o CAD, prior MI, and bilateral hip replacements with multiple dislocations in the past here with right hip dislocation. Patient was bending over with right foot on counter clipping toenails when he felt the joint pop out of place this morning. He is in 5/10 pain but has no numbness or weakness in the RLE. He has felt this multiple times before and is certain it is dislocated. He denies chest pain, dyspnea, or other concerns. Does not report incontinence.  Past Medical History  Diagnosis Date  . Coronary artery disease   . Myocardial infarction     Previous anterolateral myocardial infarction with presistent ST-T wave changes  . Left hip pain     Chronic left hip discomfort  . 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 Tachycardia     History of paroxysmal supraventricular tachycardia  . Hypercholesterolemia   . History of BPH   . Osteoarthritis   . Hypertension   . Diverticulosis   . SVT (supraventricular tachycardia)   . Tubular adenoma   . Carpal tunnel syndrome   . BPH (benign prostatic hyperplasia)   . IBS (irritable bowel syndrome)   . Kidney stone    Past Surgical History  Procedure Laterality Date  . Cardiac catheterization  05/07/2002    Ejection fraction 20%  . Angioplasty  2002    with stent and angioplasty in 2003  . Hip surgery    . Hip closed reduction  01/31/2012    Procedure: CLOSED MANIPULATION HIP;  Surgeon: [name], MD;  Location: [facility];  Service: Orthopedics;  Laterality: Right;  closed reduction right dislocated hip  . Appendectomy    . Tonsillectomy    . Hip closed reduction  10/10/2012    Procedure: CLOSED MANIPULATION HIP;  Surgeon: [name], MD;  Location: [facility]  [facility];  Service: Orthopedics;  Laterality: Right;  . Transurethral resection of prostate    . Back surgery    . Finger surgery     Family History  Problem Relation Age of Onset  . Heart attack Father   . Stroke Father   . Stroke Mother   . Alzheimer's disease Mother    History  Substance Use Topics  . Smoking status: Never Smoker   . Smokeless tobacco: Never Used  . Alcohol Use: No    Review of Systems  All other systems reviewed and are negative.   Allergies  Prilosec and Prednisone  Home Medications   Prior to Admission medications   Medication Sig Start Date End Date Taking? Authorizing Provider  aspirin EC 81 MG tablet Take 81 mg by mouth daily.   Yes Historical Provider, MD  celecoxib (CELEBREX) 200 MG capsule Take 200 mg by mouth daily.   Yes Historical Provider, MD  clopidogrel (PLAVIX) 75 MG tablet Take 75 mg by mouth daily with breakfast.   Yes Historical Provider, MD  donepezil (ARICEPT) 10 MG tablet Take 10 mg by mouth at bedtime.   Yes Historical Provider, MD  finasteride (PROSCAR) 5 MG tablet Take 5 mg by mouth daily.   Yes Historical Provider, MD  metoprolol (LOPRESSOR) 50 MG  tablet Take 50 mg by mouth 2 (two) times daily.   Yes Historical Provider, MD  omega-3 acid ethyl esters (LOVAZA) 1 G capsule Take 1 g by mouth 2 (two) times daily.   Yes Historical Provider, MD  rosuvastatin (CRESTOR) 5 MG tablet Take 5 mg by mouth at bedtime.   Yes Historical Provider, MD  terazosin (HYTRIN) 10 MG capsule Take 10 mg by mouth at bedtime.   Yes Historical Provider, MD  valsartan-hydrochlorothiazide (DIOVAN-HCT) 160-12.5 MG per tablet Take 1 tablet by mouth daily.   Yes Historical Provider, MD   BP 156/64  Pulse 41  Temp(Src) 98.2 F (36.8 C) (Oral)  Resp 12  Ht 5\' 6"  (1.676 m)  Wt 160 lb (72.576 kg)  BMI 25.84 kg/m2  SpO2 100% Physical Exam GEN: NAD, lying in bed HEENT: Atraumatic, normocephalic, neck supple, EOMI, sclera clear  CV: Bradycardia, regular rhythm, no  murmurs, rubs, or gallops PULM: normal effort SKIN: No rash or cyanosis; warm and well-perfused EXTR: RLE unable to be straightened, holding in flexed position at knee to 30 degrees, right hip palpated posteriolateral to normal position, no erythema or inflammation, No lower extremity edema or calf tenderness, able to wiggle toes and dorsiflex and plantarflex foot against resistance, 1+ bilateral DP pulses PSYCH: Mood and affect euthymic, normal rate and volume of speech NEURO: Awake, alert, no focal deficits grossly, normal speech  ED Course  Procedures (including critical care time) Labs Review Labs Reviewed - No data to display  Imaging Review Dg Hip 1 View Right  05/14/2014   CLINICAL DATA:  Previous right hip dislocation  EXAM: RIGHT HIP - 1 VIEW  COMPARISON:  05/14/2014  FINDINGS: The right hip prosthesis has been relocated into the acetabular component. No acute bony abnormality is seen.   Electronically Signed   By: Inez Catalina M.D.   On: 05/14/2014 09:00   Dg Hip Complete Right  05/14/2014   CLINICAL DATA:  Right hip pain  EXAM: RIGHT HIP - COMPLETE 2+ VIEW  COMPARISON:  10/10/2012  FINDINGS: The patient has bilateral total hip arthroplasty device. There has been superior right hip dislocation. The left hip appears located. Degenerative disc disease is noted within the lower lumbar spine.  IMPRESSION: 1. Recurrent superior dislocation of right hip.   Electronically Signed   By: Kerby Moors M.D.   On: 05/14/2014 07:54     EKG Interpretation None      MDM   Final diagnoses:  Hip dislocation, right   80 y.o. male with right hip dislocation confirmed on exam and xray. Patient otherwise stable. Patient tolerated conscious sedation with propofol and reduction of hip dislocation. Post-reduction hip xray normal. - applied straight knee immobilizer - Recommended follow up early next week with [facility] and continue knee immobilizer until that time. - Return  precautions reveiwed  Hilton Sinclair, MD PGY-2, [facility]    Hilton Sinclair, MD 05/14/14 [phone]

## 2014-05-14 NOTE — ED Notes (Signed)
Pt reports normal HR of 40-50. Campos at bedside and aware.

## 2014-05-14 NOTE — ED Notes (Signed)
Right hip placed back in place by [facility].

## 2014-05-14 NOTE — ED Notes (Signed)
Pt transported to xray 

## 2014-05-14 NOTE — ED Notes (Signed)
Pt talking and moving extremities.

## 2014-05-14 NOTE — ED Notes (Signed)
Bed: HE17 Expected date:  Expected time:  Means of arrival:  Comments: EMS/80 yo male clipping toenails and popped out hip

## 2014-05-14 NOTE — ED Notes (Signed)
Pt transported to xray. Pt states [right hip] "feels like its supposed to.Marland KitchenMarland Kitchen"

## 2014-05-14 NOTE — ED Notes (Signed)
Pt O2 sat on RA 91-93%. Pt placed on 2 lpm [place]. Spink notified.

## 2014-05-17 ENCOUNTER — Other Ambulatory Visit: Payer: Medicare Other

## 2014-05-17 ENCOUNTER — Ambulatory Visit (INDEPENDENT_AMBULATORY_CARE_PROVIDER_SITE_OTHER): Payer: Medicare Other | Admitting: Cardiology

## 2014-05-17 ENCOUNTER — Encounter: Payer: Self-pay | Admitting: Cardiology

## 2014-05-17 VITALS — BP 158/67 | HR 43 | Ht 66.0 in | Wt 160.0 lb

## 2014-05-17 DIAGNOSIS — E785 Hyperlipidemia, unspecified: Secondary | ICD-10-CM

## 2014-05-17 DIAGNOSIS — I119 Hypertensive heart disease without heart failure: Secondary | ICD-10-CM

## 2014-05-17 DIAGNOSIS — Z8679 Personal history of other diseases of the circulatory system: Secondary | ICD-10-CM

## 2014-05-17 DIAGNOSIS — E7800 Pure hypercholesterolemia, unspecified: Secondary | ICD-10-CM

## 2014-05-17 DIAGNOSIS — I2510 Atherosclerotic heart disease of native coronary artery without angina pectoris: Secondary | ICD-10-CM

## 2014-05-17 DIAGNOSIS — I259 Chronic ischemic heart disease, unspecified: Secondary | ICD-10-CM

## 2014-05-17 DIAGNOSIS — M1990 Unspecified osteoarthritis, unspecified site: Secondary | ICD-10-CM

## 2014-05-17 LAB — LIPID PANEL
Cholesterol: 126 mg/dL (ref 0–200)
HDL: 47.9 mg/dL (ref >39.00)
LDL CALC: 73 mg/dL (ref 0–99)
TRIGLYCERIDES: 24 mg/dL (ref 0.0–149.0)
Total CHOL/HDL Ratio: 3
VLDL: 4.8 mg/dL (ref 0.0–40.0)

## 2014-05-17 LAB — HEPATIC FUNCTION PANEL
ALBUMIN: 3.7 g/dL (ref 3.5–5.2)
ALK PHOS: 97 U/L (ref 39–117)
ALT: 19 U/L (ref 0–53)
AST: 33 U/L (ref 0–37)
Bilirubin, Direct: 0.1 mg/dL (ref 0.0–0.3)
TOTAL PROTEIN: 6.4 g/dL (ref 6.0–8.3)
Total Bilirubin: 0.7 mg/dL (ref 0.2–1.2)

## 2014-05-17 LAB — BASIC METABOLIC PANEL
BUN: 25 mg/dL — ABNORMAL HIGH (ref 6–23)
CHLORIDE: 108 meq/L (ref 96–112)
CO2: 26 meq/L (ref 19–32)
CREATININE: 1.3 mg/dL (ref 0.4–1.5)
Calcium: 9 mg/dL (ref 8.4–10.5)
GFR: 53.65 mL/min — ABNORMAL LOW (ref >60.00)
Glucose, Bld: 91 mg/dL (ref 70–99)
Potassium: 4.4 mEq/L (ref 3.5–5.1)
SODIUM: 141 meq/L (ref 135–145)

## 2014-05-17 NOTE — Assessment & Plan Note (Signed)
The patient has not been experiencing any chest pain or exertional angina.  He does get chest pain if he goes into SVT.  This was relieved by bicarbonate of soda and belching.

## 2014-05-17 NOTE — Assessment & Plan Note (Signed)
Since we last saw him he had another dislocation of his prosthetic right hip joint.  This time it was fixed in the emergency room and he was discharged without having to stay overnight

## 2014-05-17 NOTE — Assessment & Plan Note (Signed)
The patient has episodes of SVT about once a month.  Usually it occurs if he eats too fast rates too much.  It is relieved by drinking a glass of ice water and taking some bicarbonate of soda and belching.

## 2014-05-17 NOTE — Patient Instructions (Addendum)
Will obtain labs today and call you with the results (lp/bmet/hfp)  Your physician recommends that you continue on your current medications as directed. Please refer to the Current Medication list given to you today.  Your physician wants you to follow-up in: 6 months with fasting labs (lp/bmet/hfp) and ekg You will receive a reminder letter in the mail two months in advance. If you don't receive a letter, please call our office to schedule the follow-up appointment.  

## 2014-05-17 NOTE — Progress Notes (Signed)
[name] Date of Birth:  1925/01/02 [place] [address] [place], [place]  [address] [phone]        Fax   [phone]   History of Present Illness: This pleasant 80 year old gentleman is seen for a followup office visit. He has a history of ischemic heart disease. He had angioplasty and stent in 2002. He had another angioplasty in 2003. In November 2009 he had a nuclear stress test which showed no reversible ischemia. On 02/28/11 he had another nuclear stress test as preop clearance for a hip revision procedure and there was no evidence of ischemia but there was an old scar present. Patient has a past history of paroxysmal supraventricular tachycardia. In addition the patient has a history of osteoarthritis. He has had dyslipidemia and is on Crestor. He has had prior bilateral hip replacements. He has had recurrent dislocations of his right hip prosthesis.    Current Outpatient Prescriptions  Medication Sig Dispense Refill  . aspirin EC 81 MG tablet Take 81 mg by mouth daily.      . celecoxib (CELEBREX) 200 MG capsule Take 200 mg by mouth daily.      . clopidogrel (PLAVIX) 75 MG tablet Take 75 mg by mouth daily with breakfast.      . donepezil (ARICEPT) 10 MG tablet Take 10 mg by mouth at bedtime.      . finasteride (PROSCAR) 5 MG tablet Take 5 mg by mouth daily.      . metoprolol (LOPRESSOR) 50 MG tablet Take 50 mg by mouth 2 (two) times daily.      [name] omega-3 acid ethyl esters (LOVAZA) 1 G capsule Take 1 g by mouth 2 (two) times daily.      . rosuvastatin (CRESTOR) 5 MG tablet Take 5 mg by mouth at bedtime.      [name] terazosin (HYTRIN) 10 MG capsule Take 10 mg by mouth at bedtime.      . valsartan-hydrochlorothiazide (DIOVAN-HCT) 160-12.5 MG per tablet Take 1 tablet by mouth daily.       No current facility-administered medications for this visit.    Allergies  Allergen Reactions  . Prilosec [Omeprazole]     Diarrhea, stomach cramps  .  Prednisone Palpitations    REACTION: causes tachycardia    Patient Active Problem List   Diagnosis Date Noted  . Hip dislocation 01/31/2012  . Osteoarthritis 08/12/2011  . COUGH 11/03/2008  . TUBULOVILLOUS ADENOMA, COLON 02/25/2008  . HYPERLIPIDEMIA 02/25/2008  . HYPERTENSION 02/25/2008  . MYOCARDIAL INFARCTION 02/25/2008  . CORONARY ARTERY DISEASE 02/25/2008  . GERD 02/25/2008  . DIVERTICULOSIS, COLON 02/25/2008  . DEGENERATIVE JOINT DISEASE, CERVICAL SPINE 02/25/2008  . TRIGGER FINGER 02/25/2008  . HYPERGLYCEMIA 02/25/2008  . CARPAL TUNNEL SYNDROME, HX OF 02/25/2008  . SUPRAVENTRICULAR TACHYCARDIA, HX OF 02/25/2008  . NEPHROLITHIASIS, HX OF 02/25/2008  . BENIGN PROSTATIC HYPERTROPHY, HX OF 02/25/2008  . Unspecified Personal History Presenting Hazards to Health 02/25/2008  . APPENDECTOMY, HX OF 02/25/2008  . PERCUTANEOUS TRANSLUMINAL CORONARY ANGIOPLASTY, HX OF 02/25/2008  . CARPAL TUNNEL RELEASE, RIGHT, HX OF 02/25/2008    History  Smoking status  . Never Smoker   Smokeless tobacco  . Never Used    History  Alcohol Use No    Family History  Problem Relation Age of Onset  . Heart attack Father   . Stroke Father   . Stroke Mother   . Alzheimer's disease Mother     Review of Systems: Constitutional:  no fever chills diaphoresis or fatigue or change in weight.  Head and neck: no hearing loss, no epistaxis, no photophobia or visual disturbance. Respiratory: No cough, shortness of breath or wheezing. Cardiovascular: No chest pain peripheral edema, palpitations. Gastrointestinal: No abdominal distention, no abdominal pain, no change in bowel habits hematochezia or melena. Genitourinary: No dysuria, no frequency, no urgency, no nocturia. Musculoskeletal:No arthralgias, no back pain, no gait disturbance or myalgias. Neurological: No dizziness, no headaches, no numbness, no seizures, no syncope, no weakness, no tremors. Hematologic: No lymphadenopathy, no easy  bruising. Psychiatric: No confusion, no hallucinations, no sleep disturbance.    Physical Exam: Filed Vitals:   05/17/14 0902  BP: 158/67  Pulse: 43   the general appearance reveals an elderly gentleman who is alert and in no distress.The head and neck exam reveals pupils equal and reactive.  Extraocular movements are full.  There is no scleral icterus.  The mouth and pharynx are normal.  The neck is supple.  The carotids reveal no bruits.  The jugular venous pressure is normal.  The  thyroid is not enlarged.  There is no lymphadenopathy.  The chest is clear to percussion and auscultation.  There are no rales or rhonchi.  Expansion of the chest is symmetrical.  The precordium is quiet.  The first heart sound is normal.  The second heart sound is physiologically split.  There is no murmur gallop rub or click.  There is no abnormal lift or heave.  The abdomen is soft and nontender.  The bowel sounds are normal.  The liver and spleen are not enlarged.  There are no abdominal masses.  There are no abdominal bruits.  Extremities reveal good pedal pulses.  There is no phlebitis or edema.  There is no cyanosis or clubbing.  Strength is normal and symmetrical in all extremities.  There is no lateralizing weakness.  There are no sensory deficits.  The skin is warm and dry.  There is no rash.  He is wearing a brace on his right leg to remind him not to extend or flex his hip to much .  EKG shows marked sinus bradycardia.  There are new Q waves present since previous tracing of 11/10/13.  We will recheck his EKG again at his next visit   Assessment / Plan: 1. ischemic heart disease status post angioplasty and stent in 2002 and again in 2003.  Most recent nuclear stress test 2012 showing no ischemia but there was old scar present. 2. Hypercholesterolemia 3. hypertensive heart disease without CHF 4. hypercholesterolemia  Disposition.  Continue same medication.  He is staying very busy at home and having to  do all of the housework because his wife has been incapacitated with depression again.  Recheck here in 6 months for office visit EKG lipid panel hepatic function panel and basal metabolic panel

## 2014-05-17 NOTE — Assessment & Plan Note (Signed)
The patient is doing well on his Crestor.  He denies any myalgias.  We are checking lab work today

## 2014-05-18 NOTE — Progress Notes (Signed)
Quick Note:  Please report to patient. The recent labs are stable. Continue same medication and careful diet. ______ 

## 2014-05-23 ENCOUNTER — Other Ambulatory Visit: Payer: Self-pay | Admitting: *Deleted

## 2014-05-23 MED ORDER — VALSARTAN-HYDROCHLOROTHIAZIDE 160-12.5 MG PO TABS: 1.0000 | ORAL_TABLET | Freq: Every day | ORAL | Status: DC

## 2014-05-23 MED ORDER — ROSUVASTATIN CALCIUM 5 MG PO TABS: 5.0000 mg | ORAL_TABLET | Freq: Every day | ORAL | Status: DC

## 2014-07-18 ENCOUNTER — Other Ambulatory Visit: Payer: Self-pay | Admitting: Cardiology

## 2014-08-13 ENCOUNTER — Other Ambulatory Visit: Payer: Self-pay | Admitting: Cardiology

## 2014-08-17 ENCOUNTER — Telehealth: Payer: Self-pay | Admitting: *Deleted

## 2014-08-17 NOTE — Telephone Encounter (Signed)
Crestor samples placed at the front desk for pick up. 

## 2014-09-07 ENCOUNTER — Other Ambulatory Visit: Payer: Self-pay | Admitting: *Deleted

## 2014-09-07 MED ORDER — TERAZOSIN HCL 10 MG PO CAPS: 10.0000 mg | ORAL_CAPSULE | Freq: Every day | ORAL | Status: DC

## 2014-11-14 ENCOUNTER — Telehealth: Payer: Self-pay | Admitting: Cardiology

## 2014-11-14 ENCOUNTER — Encounter: Payer: Self-pay | Admitting: Cardiology

## 2014-11-14 ENCOUNTER — Ambulatory Visit (INDEPENDENT_AMBULATORY_CARE_PROVIDER_SITE_OTHER): Payer: Medicare Other | Admitting: Cardiology

## 2014-11-14 VITALS — BP 168/78 | HR 37 | Ht 66.0 in | Wt 168.0 lb

## 2014-11-14 DIAGNOSIS — E7800 Pure hypercholesterolemia, unspecified: Secondary | ICD-10-CM

## 2014-11-14 DIAGNOSIS — I471 Supraventricular tachycardia: Secondary | ICD-10-CM

## 2014-11-14 DIAGNOSIS — I2510 Atherosclerotic heart disease of native coronary artery without angina pectoris: Secondary | ICD-10-CM

## 2014-11-14 DIAGNOSIS — I10 Essential (primary) hypertension: Secondary | ICD-10-CM

## 2014-11-14 DIAGNOSIS — R001 Bradycardia, unspecified: Secondary | ICD-10-CM

## 2014-11-14 DIAGNOSIS — I259 Chronic ischemic heart disease, unspecified: Secondary | ICD-10-CM

## 2014-11-14 DIAGNOSIS — I119 Hypertensive heart disease without heart failure: Secondary | ICD-10-CM

## 2014-11-14 HISTORY — DX: Bradycardia, unspecified: R00.1

## 2014-11-14 NOTE — Assessment & Plan Note (Signed)
The patient has a past history of paroxysmal SVT.  It often occurs in the mornings.  He can stop it by drinking a glass of cold water with some baking soda and it and then belching.

## 2014-11-14 NOTE — Assessment & Plan Note (Signed)
The patient has chronic sinus bradycardia.  His heart rate today is 37.  At his last visit it was 51.  He is on beta blocker and Aricept.  He denies any dizziness or syncope.  He denies any symptoms from his bradycardia.

## 2014-11-14 NOTE — Telephone Encounter (Signed)
New Message       Pt calling stating that he drank a glass of milk at around 5am and forgot that he is coming in to see. Dr. Mare Ferrari and doing fasting labs. Pt wants to know if he can still come in for labs. Please call back and advise.

## 2014-11-14 NOTE — Progress Notes (Signed)
[name] Date of Birth:  [birth year] [place] [address] [place], [place]  [address] [phone]        Fax   [phone]   History of Present Illness: This pleasant 80 year old gentleman is seen for a followup office visit. He has a history of ischemic heart disease. He had angioplasty and stent in 2002. He had another angioplasty in 2003. In November 2009 he had a nuclear stress test which showed no reversible ischemia. On 02/28/11 he had another nuclear stress test as preop clearance for a hip revision procedure and there was no evidence of ischemia but there was an old scar present. Patient has a past history of paroxysmal supraventricular tachycardia. In addition the patient has a history of osteoarthritis. He has had dyslipidemia and is on Crestor. He has had prior bilateral hip replacements. He has had recurrent dislocations of his right hip prosthesis.  He has had some memory problems and is on generic Aricept.   Current Outpatient Prescriptions  Medication Sig Dispense Refill  . aspirin EC 81 MG tablet Take 81 mg by mouth daily.    . celecoxib (CELEBREX) 200 MG capsule Take 200 mg by mouth daily.    . clopidogrel (PLAVIX) 75 MG tablet Take 75 mg by mouth daily with breakfast.    . donepezil (ARICEPT) 10 MG tablet Take 10 mg by mouth at bedtime.    . finasteride (PROSCAR) 5 MG tablet Take 5 mg by mouth daily.    [name] FLUZONE HIGH-DOSE 0.5 ML SUSY   0  . metoprolol (LOPRESSOR) 50 MG tablet Take 50 mg by mouth as directed. 1 IN THE MORNING AND 1/2 IN THE EVENING    . omega-3 acid ethyl esters (LOVAZA) 1 G capsule Take 1 g by mouth 2 (two) times daily.    . rosuvastatin (CRESTOR) 5 MG tablet Take 1 tablet (5 mg total) by mouth at bedtime. 90 tablet 3  . terazosin (HYTRIN) 10 MG capsule Take 1 capsule (10 mg total) by mouth at bedtime. 10 capsule 0  . valsartan-hydrochlorothiazide (DIOVAN-HCT) 160-12.5 MG per tablet Take 1 tablet by mouth daily. 90  tablet 3   No current facility-administered medications for this visit.    Allergies  Allergen Reactions  . Prilosec [Omeprazole]     Diarrhea, stomach cramps  . Prednisone Palpitations    REACTION: causes tachycardia    Patient Active Problem List   Diagnosis Date Noted  . Hip dislocation 01/31/2012  . Osteoarthritis 08/12/2011  . COUGH 11/03/2008  . TUBULOVILLOUS ADENOMA, COLON 02/25/2008  . HYPERLIPIDEMIA 02/25/2008  . HYPERTENSION 02/25/2008  . MYOCARDIAL INFARCTION 02/25/2008  . CORONARY ARTERY DISEASE 02/25/2008  . GERD 02/25/2008  . DIVERTICULOSIS, COLON 02/25/2008  . DEGENERATIVE JOINT DISEASE, CERVICAL SPINE 02/25/2008  . TRIGGER FINGER 02/25/2008  . HYPERGLYCEMIA 02/25/2008  . CARPAL TUNNEL SYNDROME, HX OF 02/25/2008  . SUPRAVENTRICULAR TACHYCARDIA, HX OF 02/25/2008  . NEPHROLITHIASIS, HX OF 02/25/2008  . BENIGN PROSTATIC HYPERTROPHY, HX OF 02/25/2008  . Unspecified Personal History Presenting Hazards to Health 02/25/2008  . APPENDECTOMY, HX OF 02/25/2008  . PERCUTANEOUS TRANSLUMINAL CORONARY ANGIOPLASTY, HX OF 02/25/2008  . CARPAL TUNNEL RELEASE, RIGHT, HX OF 02/25/2008    History  Smoking status  . Never Smoker   Smokeless tobacco  . Never Used    History  Alcohol Use No    Family History  Problem Relation Age of Onset  . Heart attack Father   . Stroke Father   .  Stroke Mother   . Alzheimer's disease Mother     Review of Systems: Constitutional: no fever chills diaphoresis or fatigue or change in weight.  Head and neck: no hearing loss, no epistaxis, no photophobia or visual disturbance. Respiratory: No cough, shortness of breath or wheezing. Cardiovascular: No chest pain peripheral edema, palpitations. Gastrointestinal: No abdominal distention, no abdominal pain, no change in bowel habits hematochezia or melena. Genitourinary: No dysuria, no frequency, no urgency, no nocturia. Musculoskeletal:No arthralgias, no back pain, no gait  disturbance or myalgias. Neurological: No dizziness, no headaches, no numbness, no seizures, no syncope, no weakness, no tremors. Hematologic: No lymphadenopathy, no easy bruising. Psychiatric: No confusion, no hallucinations, no sleep disturbance.    Physical Exam: Filed Vitals:   11/14/14 1519  BP: 168/78  Pulse: 37   the general appearance reveals an elderly gentleman who is alert and in no distress.The head and neck exam reveals pupils equal and reactive.  Extraocular movements are full.  There is no scleral icterus.  The mouth and pharynx are normal.  The neck is supple.  The carotids reveal no bruits.  The jugular venous pressure is normal.  The  thyroid is not enlarged.  There is no lymphadenopathy.  The chest is clear to percussion and auscultation.  There are no rales or rhonchi.  Expansion of the chest is symmetrical.  The precordium is quiet.  The first heart sound is normal.  The second heart sound is physiologically split.  There is no murmur gallop rub or click.  There is no abnormal lift or heave.  The abdomen is soft and nontender.  The bowel sounds are normal.  The liver and spleen are not enlarged.  There are no abdominal masses.  There are no abdominal bruits.  Extremities reveal good pedal pulses.  There is no phlebitis or edema.  There is no cyanosis or clubbing.  Strength is normal and symmetrical in all extremities.  There is no lateralizing weakness.  There are no sensory deficits.  The skin is warm and dry.  There is no rash.  He is wearing a brace on his right leg to remind him not to extend or flex his hip to much .  EKG shows marked sinus bradycardia.  Since prior tracing the inferior wall Q waves are no longer seen.  Today's heart rate is 37, down from previous 43.   Assessment / Plan: 1. ischemic heart disease status post angioplasty and stent in 2002 and again in 2003.  Most recent nuclear stress test 2012 showing no ischemia but there was old scar present. 2.  Hypercholesterolemia 3. hypertensive heart disease without CHF 4.  Marked sinus bradycardia  Disposition.  Continue same medication.  Because of his bradycardia we are going to reduce his Lopressor to 50 mg in the morning but only 25 mg in the evening. We are checking fasting lab work today.  Recheck in 6 months for office visit lipid panel hepatic function panel and basal metabolic panel.

## 2014-11-14 NOTE — Assessment & Plan Note (Signed)
The patient has not been expressing any chest pain or angina.  He has not had to take any sublingual nitroglycerin.

## 2014-11-14 NOTE — Patient Instructions (Signed)
Will obtain labs today and call you with the results (LP/BMET/HFP)  DECREASE YOUR METOPROLOL TO 50 MG IN THE MORNING AND 1/2 (25 MG) IN THE EVENING  Your physician wants you to follow-up in: 6 months with fasting labs (lp/bmet/hfp)  You will receive a reminder letter in the mail two months in advance. If you don't receive a letter, please call our office to schedule the follow-up appointment.

## 2014-11-15 ENCOUNTER — Other Ambulatory Visit: Payer: Self-pay | Admitting: Cardiology

## 2014-11-15 LAB — BASIC METABOLIC PANEL
BUN: 31 mg/dL — AB (ref 6–23)
CHLORIDE: 107 meq/L (ref 96–112)
CO2: 27 mEq/L (ref 19–32)
Calcium: 9.6 mg/dL (ref 8.4–10.5)
Creatinine, Ser: 1.3 mg/dL (ref 0.4–1.5)
GFR: 54.06 mL/min — AB (ref >60.00)
Glucose, Bld: 81 mg/dL (ref 70–99)
POTASSIUM: 5.4 meq/L — AB (ref 3.5–5.1)
Sodium: 141 mEq/L (ref 135–145)

## 2014-11-15 LAB — LIPID PANEL
CHOL/HDL RATIO: 3
CHOLESTEROL: 147 mg/dL (ref 0–200)
HDL: 48.5 mg/dL (ref >39.00)
LDL CALC: 88 mg/dL (ref 0–99)
NonHDL: 98.5
TRIGLYCERIDES: 55 mg/dL (ref 0.0–149.0)
VLDL: 11 mg/dL (ref 0.0–40.0)

## 2014-11-15 LAB — HEPATIC FUNCTION PANEL
ALBUMIN: 4.2 g/dL (ref 3.5–5.2)
ALT: 21 U/L (ref 0–53)
AST: 32 U/L (ref 0–37)
Alkaline Phosphatase: 91 U/L (ref 39–117)
BILIRUBIN DIRECT: 0.1 mg/dL (ref 0.0–0.3)
TOTAL PROTEIN: 6.9 g/dL (ref 6.0–8.3)
Total Bilirubin: 0.9 mg/dL (ref 0.2–1.2)

## 2014-11-23 ENCOUNTER — Other Ambulatory Visit: Payer: Self-pay

## 2014-11-23 MED ORDER — CLOPIDOGREL BISULFATE 75 MG PO TABS: 75.0000 mg | ORAL_TABLET | Freq: Every day | ORAL | Status: DC

## 2014-12-05 ENCOUNTER — Other Ambulatory Visit: Payer: Self-pay | Admitting: *Deleted

## 2014-12-05 MED ORDER — CLOPIDOGREL BISULFATE 75 MG PO TABS: 75.0000 mg | ORAL_TABLET | Freq: Every day | ORAL | Status: DC

## 2014-12-12 ENCOUNTER — Telehealth: Payer: Self-pay

## 2014-12-12 NOTE — Telephone Encounter (Signed)
Patient called to get samples of crestor placed them up front

## 2015-01-10 ENCOUNTER — Telehealth: Payer: Self-pay

## 2015-01-10 NOTE — Telephone Encounter (Signed)
Patient called to get samples of crestor placed samples up front

## 2015-01-31 ENCOUNTER — Other Ambulatory Visit: Payer: Self-pay | Admitting: Cardiology

## 2015-02-01 NOTE — Telephone Encounter (Signed)
[name], MD at 11/14/2014 5:12 PM  Patient Instructions    Will obtain labs today and call you with the results (LP/BMET/HFP)  DECREASE YOUR METOPROLOL TO 50 MG IN THE MORNING AND 1/2 (25 MG) IN THE EVENING  Your physician wants you to follow-up in: 6 months with fasting labs (lp/bmet/hfp) You will receive a reminder letter in the mail two months in advance. If you don't receive a letter, please call our office to schedule the follow-up appointment

## 2015-02-10 ENCOUNTER — Other Ambulatory Visit: Payer: Self-pay | Admitting: *Deleted

## 2015-02-10 MED ORDER — ROSUVASTATIN CALCIUM 5 MG PO TABS: 5.0000 mg | ORAL_TABLET | Freq: Every day | ORAL | Status: DC

## 2015-02-15 ENCOUNTER — Other Ambulatory Visit: Payer: Self-pay | Admitting: Cardiology

## 2015-02-22 ENCOUNTER — Other Ambulatory Visit: Payer: Self-pay

## 2015-02-22 MED ORDER — VALSARTAN-HYDROCHLOROTHIAZIDE 160-12.5 MG PO TABS: 1.0000 | ORAL_TABLET | Freq: Every day | ORAL | Status: DC

## 2015-03-03 ENCOUNTER — Telehealth: Payer: Self-pay

## 2015-03-03 NOTE — Telephone Encounter (Signed)
Patient call for samples of crestor 5 mg placed samples up front

## 2015-03-17 ENCOUNTER — Encounter (HOSPITAL_COMMUNITY)
Admission: EM | Disposition: A | Discharge status: Home / Self Care | Payer: Self-pay | Source: Home / Self Care | Attending: Emergency Medicine

## 2015-03-17 ENCOUNTER — Emergency Department (HOSPITAL_COMMUNITY)
Admission: EM | Admit: 2015-03-17 | Discharge: 2015-03-17 | Disposition: A | Discharge status: Home / Self Care | Payer: Medicare Other | Attending: Emergency Medicine | Admitting: Emergency Medicine

## 2015-03-17 ENCOUNTER — Emergency Department (HOSPITAL_COMMUNITY): Payer: Medicare Other

## 2015-03-17 ENCOUNTER — Encounter (HOSPITAL_COMMUNITY): Payer: Self-pay | Admitting: Emergency Medicine

## 2015-03-17 ENCOUNTER — Emergency Department (HOSPITAL_COMMUNITY): Payer: Medicare Other | Admitting: Certified Registered"

## 2015-03-17 DIAGNOSIS — Z955 Presence of coronary angioplasty implant and graft: Secondary | ICD-10-CM | POA: Insufficient documentation

## 2015-03-17 DIAGNOSIS — Z79899 Other long term (current) drug therapy: Secondary | ICD-10-CM | POA: Insufficient documentation

## 2015-03-17 DIAGNOSIS — N400 Enlarged prostate without lower urinary tract symptoms: Secondary | ICD-10-CM | POA: Insufficient documentation

## 2015-03-17 DIAGNOSIS — I2510 Atherosclerotic heart disease of native coronary artery without angina pectoris: Secondary | ICD-10-CM | POA: Diagnosis not present

## 2015-03-17 DIAGNOSIS — Z7902 Long term (current) use of antithrombotics/antiplatelets: Secondary | ICD-10-CM | POA: Diagnosis not present

## 2015-03-17 DIAGNOSIS — Z7982 Long term (current) use of aspirin: Secondary | ICD-10-CM | POA: Diagnosis not present

## 2015-03-17 DIAGNOSIS — E780 Pure hypercholesterolemia: Secondary | ICD-10-CM | POA: Insufficient documentation

## 2015-03-17 DIAGNOSIS — Y9389 Activity, other specified: Secondary | ICD-10-CM | POA: Diagnosis not present

## 2015-03-17 DIAGNOSIS — I472 Ventricular tachycardia: Secondary | ICD-10-CM | POA: Insufficient documentation

## 2015-03-17 DIAGNOSIS — S73004A Unspecified dislocation of right hip, initial encounter: Secondary | ICD-10-CM

## 2015-03-17 DIAGNOSIS — I10 Essential (primary) hypertension: Secondary | ICD-10-CM | POA: Insufficient documentation

## 2015-03-17 DIAGNOSIS — S73004D Unspecified dislocation of right hip, subsequent encounter: Secondary | ICD-10-CM

## 2015-03-17 DIAGNOSIS — Y792 Prosthetic and other implants, materials and accessory orthopedic devices associated with adverse incidents: Secondary | ICD-10-CM | POA: Insufficient documentation

## 2015-03-17 DIAGNOSIS — I252 Old myocardial infarction: Secondary | ICD-10-CM | POA: Diagnosis not present

## 2015-03-17 DIAGNOSIS — Z96641 Presence of right artificial hip joint: Secondary | ICD-10-CM | POA: Insufficient documentation

## 2015-03-17 DIAGNOSIS — I259 Chronic ischemic heart disease, unspecified: Secondary | ICD-10-CM | POA: Insufficient documentation

## 2015-03-17 DIAGNOSIS — Z09 Encounter for follow-up examination after completed treatment for conditions other than malignant neoplasm: Secondary | ICD-10-CM

## 2015-03-17 DIAGNOSIS — T84020A Dislocation of internal right hip prosthesis, initial encounter: Secondary | ICD-10-CM | POA: Diagnosis present

## 2015-03-17 DIAGNOSIS — IMO0001 Reserved for inherently not codable concepts without codable children: Secondary | ICD-10-CM

## 2015-03-17 HISTORY — PX: HIP CLOSED REDUCTION: SHX983

## 2015-03-17 LAB — I-STAT CHEM 8, ED
BUN: 29 mg/dL — ABNORMAL HIGH (ref 6–23)
CALCIUM ION: 1.21 mmol/L (ref 1.13–1.30)
CHLORIDE: 105 mmol/L (ref 96–112)
Creatinine, Ser: 1.3 mg/dL (ref 0.50–1.35)
GLUCOSE: 107 mg/dL — AB (ref 70–99)
HCT: 37 % — ABNORMAL LOW (ref 39.0–52.0)
Hemoglobin: 12.6 g/dL — ABNORMAL LOW (ref 13.0–17.0)
Potassium: 4.4 mmol/L (ref 3.5–5.1)
Sodium: 142 mmol/L (ref 135–145)
TCO2: 22 mmol/L (ref 0–100)

## 2015-03-17 SURGERY — CLOSED REDUCTION, HIP
Anesthesia: General | Site: Hip | Laterality: Right

## 2015-03-17 MED ORDER — METOCLOPRAMIDE HCL 5 MG/ML IJ SOLN: 5.0000 mg | Freq: Three times a day (TID) | INTRAMUSCULAR | Status: DC | PRN

## 2015-03-17 MED ORDER — FENTANYL CITRATE 0.05 MG/ML IJ SOLN
INTRAMUSCULAR | Status: DC | PRN
  Administered 2015-03-17: 25 ug via INTRAVENOUS

## 2015-03-17 MED ORDER — MORPHINE SULFATE 2 MG/ML IJ SOLN
2.0000 mg | INTRAMUSCULAR | Status: AC | PRN
  Administered 2015-03-17 (×2): 2 mg via INTRAVENOUS
  Filled 2015-03-17 (×2): qty 1

## 2015-03-17 MED ORDER — LIDOCAINE HCL (CARDIAC) 20 MG/ML IV SOLN
INTRAVENOUS | Status: AC
  Filled 2015-03-17: qty 5

## 2015-03-17 MED ORDER — PROMETHAZINE HCL 25 MG/ML IJ SOLN: 6.2500 mg | INTRAMUSCULAR | Status: DC | PRN

## 2015-03-17 MED ORDER — ETOMIDATE 2 MG/ML IV SOLN
INTRAVENOUS | Status: AC
  Filled 2015-03-17: qty 10

## 2015-03-17 MED ORDER — LACTATED RINGERS IV SOLN
INTRAVENOUS | Status: DC
  Administered 2015-03-17: 1000 mL via INTRAVENOUS

## 2015-03-17 MED ORDER — ONDANSETRON HCL 4 MG PO TABS
4.0000 mg | ORAL_TABLET | Freq: Four times a day (QID) | ORAL | Status: DC | PRN
  Filled 2015-03-17: qty 1

## 2015-03-17 MED ORDER — METOCLOPRAMIDE HCL 5 MG PO TABS
5.0000 mg | ORAL_TABLET | Freq: Three times a day (TID) | ORAL | Status: DC | PRN
  Filled 2015-03-17: qty 2

## 2015-03-17 MED ORDER — GLYCOPYRROLATE 0.2 MG/ML IJ SOLN
INTRAMUSCULAR | Status: DC | PRN
  Administered 2015-03-17: 0.2 mg via INTRAVENOUS

## 2015-03-17 MED ORDER — ONDANSETRON HCL 4 MG/2ML IJ SOLN
INTRAMUSCULAR | Status: DC | PRN
  Administered 2015-03-17 (×2): 2 mg via INTRAVENOUS

## 2015-03-17 MED ORDER — FENTANYL CITRATE 0.05 MG/ML IJ SOLN
25.0000 ug | INTRAMUSCULAR | Status: DC | PRN
Start: 2015-03-17 — End: 2015-03-17

## 2015-03-17 MED ORDER — ONDANSETRON HCL 4 MG/2ML IJ SOLN: 4.0000 mg | Freq: Four times a day (QID) | INTRAMUSCULAR | Status: DC | PRN

## 2015-03-17 MED ORDER — FENTANYL CITRATE 0.05 MG/ML IJ SOLN
INTRAMUSCULAR | Status: AC
  Filled 2015-03-17: qty 2

## 2015-03-17 MED ORDER — MEPERIDINE HCL 50 MG/ML IJ SOLN: 6.2500 mg | INTRAMUSCULAR | Status: DC | PRN

## 2015-03-17 MED ORDER — ETOMIDATE 2 MG/ML IV SOLN
INTRAVENOUS | Status: DC | PRN
  Administered 2015-03-17: 4 mg via INTRAVENOUS

## 2015-03-17 MED ORDER — LIDOCAINE HCL (CARDIAC) 20 MG/ML IV SOLN
INTRAVENOUS | Status: DC | PRN
  Administered 2015-03-17: 50 mg via INTRAVENOUS

## 2015-03-17 MED ORDER — PROPOFOL 10 MG/ML IV BOLUS
INTRAVENOUS | Status: AC
  Filled 2015-03-17: qty 20

## 2015-03-17 MED ORDER — PROPOFOL 10 MG/ML IV BOLUS
INTRAVENOUS | Status: DC | PRN
  Administered 2015-03-17: 50 mg via INTRAVENOUS

## 2015-03-17 SURGICAL SUPPLY — 1 items: IMMOBILIZER KNEE 20 (SOFTGOODS) ×2 IMPLANT

## 2015-03-17 NOTE — Progress Notes (Signed)
Dr. Lyla Glassing verbalized to RN while in PACU that pt was to be discharged to home this afternoon once PACU discharge criteria was met.  Coolidge Breeze, RN 03/17/2015

## 2015-03-17 NOTE — Discharge Instructions (Signed)
Wear Knee Immobilizer at all times Do not bend hip past 90 degrees Do not cross your legs      General Anesthesia, Care After Refer to this sheet in the next few weeks. These instructions provide you with information on caring for yourself after your procedure. Your health care provider may also give you more specific instructions. Your treatment has been planned according to current medical practices, but problems sometimes occur. Call your health care provider if you have any problems or questions after your procedure. WHAT TO EXPECT AFTER THE PROCEDURE After the procedure, it is typical to experience:  Sleepiness.  Nausea and vomiting. HOME CARE INSTRUCTIONS  For the first 24 hours after general anesthesia:  Have a responsible person with you.  Do not drive a car. If you are alone, do not take public transportation.  Do not drink alcohol.  Do not take medicine that has not been prescribed by your health care provider.  Do not sign important papers or make important decisions.  You may resume a normal diet and activities as directed by your health care provider.  Change bandages (dressings) as directed.  If you have questions or problems that seem related to general anesthesia, call the hospital and ask for the anesthetist or anesthesiologist on call. SEEK MEDICAL CARE IF:  You have nausea and vomiting that continue the day after anesthesia.  You develop a rash. SEEK IMMEDIATE MEDICAL CARE IF:   You have difficulty breathing.  You have chest pain.  You have any allergic problems. Document Released: 03/10/2001 Document Revised: 12/07/2013 Document Reviewed: 06/17/2013 [facility] Patient Information 2015 [place], [place]. This information is not intended to replace advice given to you by your health care provider. Make sure you discuss any questions you have with your health care provider.

## 2015-03-17 NOTE — Anesthesia Postprocedure Evaluation (Signed)
  Anesthesia Post-op Note  Patient: [name]  Procedure(s) Performed: Procedure(s) (LRB): CLOSED REDUCTION HIP (Right)  Patient Location: PACU  Anesthesia Type: General  Level of Consciousness: awake and alert   Airway and Oxygen Therapy: Patient Spontanous Breathing  Post-op Pain: mild  Post-op Assessment: Post-op Vital signs reviewed, Patient's Cardiovascular Status Stable, Respiratory Function Stable, Patent Airway and No signs of Nausea or vomiting  Last Vitals:  Filed Vitals:   03/17/15 1641  BP: 171/70  Pulse: 51  Temp: 36.6 C  Resp: 16    Post-op Vital Signs: stable   Complications: No apparent anesthesia complications

## 2015-03-17 NOTE — ED Notes (Addendum)
OR ready for pt. Pt transferred to [place] by RN. Belongings with wife in [place] waiting room.

## 2015-03-17 NOTE — ED Notes (Signed)
Bed: XJ88 Expected date:  Expected time:  Means of arrival:  Comments: EMS/from SNF-hip popped out

## 2015-03-17 NOTE — ED Provider Notes (Signed)
CSN: [id number]     Arrival date & time 03/17/15  0609 History   First MD Initiated Contact with Patient 03/17/15 0700     Chief Complaint  Patient presents with  . Right hip pain   . Dislocation     (Consider location/radiation/quality/duration/timing/severity/associated sxs/prior Treatment) Patient is a 81 y.o. male presenting with leg pain.  Leg Pain Location:  Hip Time since incident:  3 hours Injury: no (pt was sitting in a chair and leaned forward to tie his shoe. )   Hip location:  R hip Pain details:    Quality:  Dull   Radiates to:  Does not radiate   Severity:  Severe   Onset quality:  Sudden   Duration:  3 hours   Timing:  Constant   Progression:  Unchanged Chronicity:  Recurrent Dislocation: yes   Relieved by:  Rest Exacerbated by: movement. Associated symptoms: decreased ROM     Past Medical History  Diagnosis Date  . Coronary artery disease   . Myocardial infarction     Previous anterolateral myocardial infarction with presistent ST-T wave changes  . Left hip pain     Chronic left hip discomfort  . 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 Tachycardia     History of paroxysmal supraventricular tachycardia  . Hypercholesterolemia   . History of BPH   . Osteoarthritis   . Hypertension   . Diverticulosis   . SVT (supraventricular tachycardia)   . Tubular adenoma   . Carpal tunnel syndrome   . BPH (benign prostatic hyperplasia)   . IBS (irritable bowel syndrome)   . Kidney stone    Past Surgical History  Procedure Laterality Date  . Cardiac catheterization  05/07/2002    Ejection fraction 20%  . Angioplasty  2002    with stent and angioplasty in 2003  . Hip surgery    . Hip closed reduction  01/31/2012    Procedure: CLOSED MANIPULATION HIP;  Surgeon: [name], MD;  Location: [facility];  Service: Orthopedics;  Laterality: Right;  closed reduction right dislocated hip  . Appendectomy    . Tonsillectomy     . Hip closed reduction  10/10/2012    Procedure: CLOSED MANIPULATION HIP;  Surgeon: [name], MD;  Location: [facility];  Service: Orthopedics;  Laterality: Right;  . Transurethral resection of prostate    . Back surgery    . Finger surgery     Family History  Problem Relation Age of Onset  . Heart attack Father   . Stroke Father   . Stroke Mother   . Alzheimer's disease Mother    History  Substance Use Topics  . Smoking status: Never Smoker   . Smokeless tobacco: Never Used  . Alcohol Use: No    Review of Systems  All other systems reviewed and are negative.     Allergies  Prilosec and Prednisone  Home Medications   Prior to Admission medications   Medication Sig Start Date End Date Taking? Authorizing Provider  aspirin EC 81 MG tablet Take 81 mg by mouth daily.   Yes Historical Provider, MD  clopidogrel (PLAVIX) 75 MG tablet Take 1 tablet (75 mg total) by mouth daily. 12/05/14  Yes [name], MD  donepezil (ARICEPT) 10 MG tablet Take 10 mg by mouth at bedtime.   Yes Historical Provider, MD  finasteride (PROSCAR) 5 MG tablet Take 5 mg by mouth daily.   Yes Historical Provider,  MD  finasteride (PROSCAR) 5 MG tablet TAKE 1 TABLET DAILY 02/15/15  Yes [name], MD  metoprolol (LOPRESSOR) 50 MG tablet 50 MG IN THE MORNING AND 1/2 (25 MG) IN THE EVENING Patient taking differently: Take 25-50 mg by mouth 2 (two) times daily. 50 MG IN THE MORNING AND 1/2 (25 MG) IN THE EVENING 02/01/15  Yes [name], MD  omega-3 acid ethyl esters (LOVAZA) 1 G capsule Take 1 g by mouth 2 (two) times daily.   Yes Historical Provider, MD  rosuvastatin (CRESTOR) 5 MG tablet Take 1 tablet (5 mg total) by mouth at bedtime. 02/10/15  Yes [name], MD  terazosin (HYTRIN) 10 MG capsule Take 1 capsule (10 mg total) by mouth at bedtime. 09/07/14  Yes [name], MD  valsartan-hydrochlorothiazide (DIOVAN-HCT) 160-12.5 MG per tablet Take 1 tablet by mouth daily. 02/22/15  Yes  [name], MD  FLUZONE HIGH-DOSE 0.5 ML SUSY  09/05/14   Historical Provider, MD  valsartan-hydrochlorothiazide (DIOVAN-HCT) 160-12.5 MG per tablet Take 1 tablet by mouth daily. Patient not taking: Reported on 03/17/2015 02/22/15   [name], MD   BP 183/71 mmHg  Pulse 48  Temp(Src) 98.2 F (36.8 C) (Oral)  Resp 18  SpO2 93% Physical Exam  Constitutional: He is oriented to person, place, and time. He appears well-developed and well-nourished. No distress.  HENT:  Head: Normocephalic and atraumatic.  Mouth/Throat: Oropharynx is clear and moist.  Eyes: Conjunctivae are normal. Pupils are equal, round, and reactive to light. No scleral icterus.  Neck: Neck supple.  Cardiovascular: Regular rhythm, normal heart sounds and intact distal pulses.  Bradycardia present.   No murmur heard. Pulmonary/Chest: Effort normal and breath sounds normal. No stridor. No respiratory distress. He has no wheezes. He has no rales.  Abdominal: Soft. He exhibits no distension. There is no tenderness.  Musculoskeletal: He exhibits no edema.       Right hip: He exhibits decreased range of motion and deformity.  Neurological: He is alert and oriented to person, place, and time.  Skin: Skin is warm and dry. No rash noted.  Psychiatric: He has a normal mood and affect. His behavior is normal.  Nursing note and vitals reviewed.   ED Course  Procedures (including critical care time) Labs Review Labs Reviewed  I-STAT CHEM 8, ED - Abnormal; Notable for the following:    BUN 29 (*)    Glucose, Bld 107 (*)    Hemoglobin 12.6 (*)    HCT 37.0 (*)    All other components within normal limits    Imaging Review Dg Hip Unilat  With Pelvis 2-3 Views Right  03/17/2015   CLINICAL DATA:  Right hip dislocated when patient bent over  EXAM: RIGHT HIP (WITH PELVIS) 2-3 VIEWS  COMPARISON:  May 14, 2014  FINDINGS: Frontal pelvis as well as frontal and lateral right hip images were obtained. There are total hip  replacements bilaterally. The prosthetic components on the left appear in anatomic alignment. On the right, of the femoral component is dislocated superior and lateral to the acetabular component. There is also a posterior component to the dislocation. No fracture. There is degenerative change in the visualized lumbar spine.  IMPRESSION: Hip prosthesis dislocation on the right with the femoral component dislocated superiorly, laterally, posteriorly with respect to the acetabular component. Total hip replacement on the left with prosthetic components appearing well-seated. No acute fracture.   Electronically Signed   By: Lowella Grip III M.D.   On: 03/17/2015 07:21  All radiology studies independently viewed by me.      EKG Interpretation   Date/Time:  Friday March 17 2015 08:34:22 EDT Ventricular Rate:  49 PR Interval:  198 QRS Duration: 94 QT Interval:  475 QTC Calculation: 429 R Axis:   39 Text Interpretation:  Sinus bradycardia Ventricular premature complex  Probable left ventricular hypertrophy Nonspecific T abnormalities, lateral  leads ST elevation, consider anterior injury Baseline wander in lead(s) V6  No significant change was found Confirmed by [facility]  MD, TREY ([id number]) on  03/17/2015 8:39:34 AM      MDM   Final diagnoses:  Hip dislocation, right, initial encounter    81 yo male with hx of right hip replacement with recurrent dislocations presenting with dislocation.   Appears uncomplicated.  NV intact.  I spoke with Dr. Rolena Infante who will arrange for patient to be taken to the OR.  Serita Grit, MD 03/17/15 [phone]

## 2015-03-17 NOTE — Brief Op Note (Signed)
03/17/2015  3:47 PM  PATIENT:  Kirstie Peri Yarbro  81 y.o. male  PRE-OPERATIVE DIAGNOSIS:  dislocation of right hip  POST-OPERATIVE DIAGNOSIS:  dislocation of right hip  PROCEDURE:  Procedure(s): CLOSED REDUCTION HIP (Right)  SURGEON:  Surgeon(s) and Role:    * Rod Can, MD - Primary  PHYSICIAN ASSISTANT:   ASSISTANTS: none   ANESTHESIA:   general  EBL:  Total I/O In: -  Out: 800 [Urine:800]  BLOOD ADMINISTERED:none  DRAINS: none   LOCAL MEDICATIONS USED:  NONE  SPECIMEN:  No Specimen  DISPOSITION OF SPECIMEN:  N/A  COUNTS:  YES  TOURNIQUET:  * No tourniquets in log *  DICTATION: .Other Dictation: Dictation Number [id number]  PLAN OF CARE: Discharge to home after PACU  PATIENT DISPOSITION:  PACU - hemodynamically stable.   Delay start of Pharmacological VTE agent (>24hrs) due to surgical blood loss or risk of bleeding: not applicable

## 2015-03-17 NOTE — ED Notes (Signed)
Pt comes from home via EMS with right hip dislocation. Pt reports he was tying his shoe when his right hip dislocated. Pt hx of frequent hip dislocation.

## 2015-03-17 NOTE — Anesthesia Procedure Notes (Signed)
Procedure Name: LMA Insertion Date/Time: 03/17/2015 3:31 PM Performed by: Montel Clock Pre-anesthesia Checklist: Patient identified, Emergency Drugs available, Suction available, Patient being monitored and Timeout performed Patient Re-evaluated:Patient Re-evaluated prior to inductionOxygen Delivery Method: Circle system utilized Preoxygenation: Pre-oxygenation with 100% oxygen Intubation Type: IV induction LMA: LMA with gastric port inserted LMA Size: 4.0 Number of attempts: 1 Dental Injury: Teeth and Oropharynx as per pre-operative assessment

## 2015-03-17 NOTE — Anesthesia Preprocedure Evaluation (Addendum)
Anesthesia Evaluation  Patient identified by MRN, date of birth, ID band Patient awake    Reviewed: Allergy & Precautions, H&P , NPO status , Patient's Chart, lab work & pertinent test results, reviewed documented beta blocker date and time   Airway Mallampati: II  TM Distance: >3 FB Neck ROM: Full    Dental  (+) Teeth Intact, Dental Advisory Given   Pulmonary neg pulmonary ROS,  breath sounds clear to auscultation        Cardiovascular hypertension, Pt. on medications + CAD and + Past MI + dysrhythmias Supra Ventricular Tachycardia Rhythm:Regular Rate:Normal  Cad, s/p PTca w/ stent 2002 Currently asymptomatic   Neuro/Psych Some memory deficits negative psych ROS   GI/Hepatic negative GI ROS, Neg liver ROS,   Endo/Other  negative endocrine ROS  Renal/GU Cr 1.59  negative genitourinary   Musculoskeletal negative musculoskeletal ROS (+)   Abdominal   Peds negative pediatric ROS (+)  Hematology negative hematology ROS (+)   Anesthesia Other Findings   Reproductive/Obstetrics negative OB ROS                            Anesthesia Physical  Anesthesia Plan  ASA: III and emergent  Anesthesia Plan: General   Post-op Pain Management:    Induction: Intravenous  Airway Management Planned: Mask  Additional Equipment:   Intra-op Plan:   Post-operative Plan:   Informed Consent: I have reviewed the patients History and Physical, chart, labs and discussed the procedure including the risks, benefits and alternatives for the proposed anesthesia with the patient or authorized representative who has indicated his/her understanding and acceptance.     Plan Discussed with: CRNA and Surgeon  Anesthesia Plan Comments:         Anesthesia Quick Evaluation

## 2015-03-17 NOTE — Transfer of Care (Signed)
Immediate Anesthesia Transfer of Care Note  Patient: [name]  Procedure(s) Performed: Procedure(s) (LRB): CLOSED REDUCTION HIP (Right)  Patient Location: PACU  Anesthesia Type: General  Level of Consciousness: sedated, patient cooperative and responds to stimulation  Airway & Oxygen Therapy: Patient Spontanous Breathing and Patient connected to face mask oxgen  Post-op Assessment: Report given to PACU RN and Post -op Vital signs reviewed and stable  Post vital signs: Reviewed and stable  Complications: No apparent anesthesia complications

## 2015-03-17 NOTE — H&P (Signed)
ORTHOPAEDIC CONSULTATION  REQUESTING PHYSICIAN: Serita Grit, MD  PCP:  Leonard Downing, MD  Chief Complaint: dislocated right total hip replacement  HPI: [name] is a 81 y.o. male who complains of  Repeat dislocation to right s/p. S/P R THA by Dr. Maureen Ralphs with multiple dislocations. Was bending over to put his socks on this morning.  Past Medical History  Diagnosis Date  . Coronary artery disease   . Myocardial infarction     Previous anterolateral myocardial infarction with presistent ST-T wave changes  . Left hip pain     Chronic left hip discomfort  . 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 Tachycardia     History of paroxysmal supraventricular tachycardia  . Hypercholesterolemia   . History of BPH   . Osteoarthritis   . Hypertension   . Diverticulosis   . SVT (supraventricular tachycardia)   . Tubular adenoma   . Carpal tunnel syndrome   . BPH (benign prostatic hyperplasia)   . IBS (irritable bowel syndrome)   . Kidney stone    Past Surgical History  Procedure Laterality Date  . Cardiac catheterization  05/07/2002    Ejection fraction 20%  . Angioplasty  2002    with stent and angioplasty in 2003  . Hip surgery    . Hip closed reduction  01/31/2012    Procedure: CLOSED MANIPULATION HIP;  Surgeon: [name], MD;  Location: [facility];  Service: Orthopedics;  Laterality: Right;  closed reduction right dislocated hip  . Appendectomy    . Tonsillectomy    . Hip closed reduction  10/10/2012    Procedure: CLOSED MANIPULATION HIP;  Surgeon: [name], MD;  Location: [facility];  Service: Orthopedics;  Laterality: Right;  . Transurethral resection of prostate    . Back surgery    . Finger surgery     History   Social History  . Marital Status: Married    Spouse Name: N/A  . Number of Children: N/A  . Years of Education: N/A   Social History Main Topics  . Smoking status: Never Smoker   . Smokeless  tobacco: Never Used  . Alcohol Use: No  . Drug Use: No  . Sexual Activity: No   Other Topics Concern  . None   Social History Narrative   Family History  Problem Relation Age of Onset  . Heart attack Father   . Stroke Father   . Stroke Mother   . Alzheimer's disease Mother    Allergies  Allergen Reactions  . Prilosec [Omeprazole]     Diarrhea, stomach cramps  . Prednisone Palpitations    REACTION: causes tachycardia   Prior to Admission medications   Medication Sig Start Date End Date Taking? Authorizing Provider  aspirin EC 81 MG tablet Take 81 mg by mouth daily.   Yes Historical Provider, MD  clopidogrel (PLAVIX) 75 MG tablet Take 1 tablet (75 mg total) by mouth daily. 12/05/14  Yes [name], MD  donepezil (ARICEPT) 10 MG tablet Take 10 mg by mouth at bedtime.   Yes Historical Provider, MD  finasteride (PROSCAR) 5 MG tablet Take 5 mg by mouth daily.   Yes Historical Provider, MD  finasteride (PROSCAR) 5 MG tablet TAKE 1 TABLET DAILY 02/15/15  Yes [name], MD  metoprolol (LOPRESSOR) 50 MG tablet 50 MG IN THE MORNING AND 1/2 (25 MG) IN THE EVENING Patient taking differently: Take 25-50 mg by mouth 2 (two)  times daily. 50 MG IN THE MORNING AND 1/2 (25 MG) IN THE EVENING 02/01/15  Yes [name], MD  omega-3 acid ethyl esters (LOVAZA) 1 G capsule Take 1 g by mouth 2 (two) times daily.   Yes Historical Provider, MD  rosuvastatin (CRESTOR) 5 MG tablet Take 1 tablet (5 mg total) by mouth at bedtime. 02/10/15  Yes [name], MD  terazosin (HYTRIN) 10 MG capsule Take 1 capsule (10 mg total) by mouth at bedtime. 09/07/14  Yes [name], MD  valsartan-hydrochlorothiazide (DIOVAN-HCT) 160-12.5 MG per tablet Take 1 tablet by mouth daily. 02/22/15  Yes [name], MD  FLUZONE HIGH-DOSE 0.5 ML SUSY  09/05/14   Historical Provider, MD  valsartan-hydrochlorothiazide (DIOVAN-HCT) 160-12.5 MG per tablet Take 1 tablet by mouth daily. Patient not taking:  Reported on 03/17/2015 02/22/15   [name], MD   Dg Hip Unilat  With Pelvis 2-3 Views Right  03/17/2015   CLINICAL DATA:  Right hip dislocated when patient bent over  EXAM: RIGHT HIP (WITH PELVIS) 2-3 VIEWS  COMPARISON:  May 14, 2014  FINDINGS: Frontal pelvis as well as frontal and lateral right hip images were obtained. There are total hip replacements bilaterally. The prosthetic components on the left appear in anatomic alignment. On the right, of the femoral component is dislocated superior and lateral to the acetabular component. There is also a posterior component to the dislocation. No fracture. There is degenerative change in the visualized lumbar spine.  IMPRESSION: Hip prosthesis dislocation on the right with the femoral component dislocated superiorly, laterally, posteriorly with respect to the acetabular component. Total hip replacement on the left with prosthetic components appearing well-seated. No acute fracture.   Electronically Signed   By: Lowella Grip III M.D.   On: 03/17/2015 07:21    Positive ROS: All other systems have been reviewed and were otherwise negative with the exception of those mentioned in the HPI and as above.  Physical Exam: General: Alert, no acute distress Cardiovascular: No pedal edema Respiratory: No cyanosis, no use of accessory musculature GI: No organomegaly, abdomen is soft and non-tender Skin: No lesions in the area of chief complaint Neurologic: Sensation intact distally Psychiatric: Patient is competent for consent with normal mood and affect Lymphatic: No axillary or cervical lymphadenopathy  MUSCULOSKELETAL: incis healed. Log short. NVI.  Assessment: Repeat R THA dislocation  Plan: To OR for closed reduction of dislocated R THA    Alessandra Sawdey, Horald Pollen, MD Cell [phone]    03/17/2015 12:02 PM

## 2015-03-17 NOTE — ED Notes (Signed)
Assumed care of patient from Nils Flack, RN.  Patient resting comfortably with wife at bedside.  Patient rates pain 4/10 currently.

## 2015-03-18 NOTE — Op Note (Signed)
[name], [name] NO.:  [id number]  MEDICAL RECORD NO.:  [id number]  LOCATION:  [facility]                         FACILITY:  [facility]  PHYSICIAN:  Rod Can, MD     DATE OF BIRTH:  [birth year]  DATE OF PROCEDURE:  03/17/2015 DATE OF DISCHARGE:  03/17/2015                              OPERATIVE REPORT   PREOPERATIVE DIAGNOSIS:  Periprosthetic dislocation of right total hip arthroplasty.  POSTOPERATIVE DIAGNOSIS:  Periprosthetic dislocation of right total hip arthroplasty.  PROCEDURE PERFORMED:  Closed reduction, right total hip arthroplasty.  ANESTHESIA:  General.  EBL:  0.  ANTIBIOTICS:  Not indicated.  COMPLICATIONS:  None.  DISPOSITION:  Stable to PACU.  INDICATIONS:  The patient is an 81 year old male who underwent a primary right total hip arthroplasty by Dr. Wynelle Link several years ago.  He was bending over to put on his socks and shoes this morning when he dislocated his right hip.  This is approximately his 9th episode of dislocation.  In the past, Dr. Wynelle Link has recommended surgery, but for various social reasons, the patient has not had surgery.  Risks, benefits, and alternatives to the above-mentioned procedure were explained.  The patient elected to proceed.  DESCRIPTION OF PROCEDURE IN DETAIL:  The patient was correctly identified in the preop holding area using 2 identifiers.  I marked the surgical site.  He was taken to the operating room, placed supine on the operating table.  General anesthesia was induced.  Antibiotics were not given as they were not indicated.  Time-out was called verifying site and site of surgery.  I applied longitudinal traction with internal rotation at the hip of 90 degrees of flexion and the hip was easily reduced.  An AP fluoro shot was used to confirm reduction.  He was fairly stable.  I applied a knee immobilizer.  The patient was then awakened from general anesthesia and transferred to the PACU in  stable condition.  There were no complications.         ______________________________ Rod Can, MD    BS/MEDQ  D:  03/17/2015  T:  03/18/2015  Job:  [id number]

## 2015-03-20 ENCOUNTER — Encounter (HOSPITAL_COMMUNITY): Payer: Self-pay | Admitting: Orthopedic Surgery

## 2015-03-20 NOTE — Progress Notes (Signed)
Called patient post op on 03/20/15 to see how he is doing after closed hip manipulation. Patient is out working in the garden and his wife states he is doing well. RN questions if he is wearing the knee immobilizer on right knee while gardening. Wife states that he is.

## 2015-03-23 ENCOUNTER — Encounter (HOSPITAL_COMMUNITY): Payer: Self-pay | Admitting: Orthopedic Surgery

## 2015-03-24 ENCOUNTER — Other Ambulatory Visit: Payer: Self-pay | Admitting: Cardiology

## 2015-04-12 ENCOUNTER — Other Ambulatory Visit: Payer: Self-pay

## 2015-04-12 MED ORDER — ROSUVASTATIN CALCIUM 5 MG PO TABS: 5.0000 mg | ORAL_TABLET | Freq: Every day | ORAL | Status: DC

## 2015-04-24 ENCOUNTER — Other Ambulatory Visit: Payer: Self-pay | Admitting: *Deleted

## 2015-04-24 MED ORDER — ROSUVASTATIN CALCIUM 5 MG PO TABS: 5.0000 mg | ORAL_TABLET | Freq: Every day | ORAL | Status: DC

## 2015-05-16 ENCOUNTER — Ambulatory Visit (INDEPENDENT_AMBULATORY_CARE_PROVIDER_SITE_OTHER): Payer: Medicare Other | Admitting: Cardiology

## 2015-05-16 ENCOUNTER — Encounter: Payer: Self-pay | Admitting: Cardiology

## 2015-05-16 VITALS — BP 140/62 | HR 40 | Ht 67.0 in | Wt 161.1 lb

## 2015-05-16 DIAGNOSIS — I259 Chronic ischemic heart disease, unspecified: Secondary | ICD-10-CM | POA: Diagnosis not present

## 2015-05-16 DIAGNOSIS — E7800 Pure hypercholesterolemia, unspecified: Secondary | ICD-10-CM

## 2015-05-16 DIAGNOSIS — I471 Supraventricular tachycardia, unspecified: Secondary | ICD-10-CM

## 2015-05-16 DIAGNOSIS — I119 Hypertensive heart disease without heart failure: Secondary | ICD-10-CM

## 2015-05-16 DIAGNOSIS — M159 Polyosteoarthritis, unspecified: Secondary | ICD-10-CM

## 2015-05-16 DIAGNOSIS — M150 Primary generalized (osteo)arthritis: Secondary | ICD-10-CM

## 2015-05-16 LAB — BASIC METABOLIC PANEL
BUN: 31 mg/dL — AB (ref 6–23)
CHLORIDE: 107 meq/L (ref 96–112)
CO2: 25 meq/L (ref 19–32)
CREATININE: 1.43 mg/dL (ref 0.40–1.50)
Calcium: 9.2 mg/dL (ref 8.4–10.5)
GFR: 49.66 mL/min — ABNORMAL LOW (ref >60.00)
GLUCOSE: 94 mg/dL (ref 70–99)
POTASSIUM: 4.3 meq/L (ref 3.5–5.1)
Sodium: 138 mEq/L (ref 135–145)

## 2015-05-16 LAB — LIPID PANEL
CHOLESTEROL: 146 mg/dL (ref 0–200)
HDL: 48.1 mg/dL (ref >39.00)
LDL Cholesterol: 85 mg/dL (ref 0–99)
NonHDL: 97.9
Total CHOL/HDL Ratio: 3
Triglycerides: 64 mg/dL (ref 0.0–149.0)
VLDL: 12.8 mg/dL (ref 0.0–40.0)

## 2015-05-16 LAB — HEPATIC FUNCTION PANEL
ALK PHOS: 95 U/L (ref 39–117)
ALT: 24 U/L (ref 0–53)
AST: 30 U/L (ref 0–37)
Albumin: 3.9 g/dL (ref 3.5–5.2)
BILIRUBIN DIRECT: 0.1 mg/dL (ref 0.0–0.3)
BILIRUBIN TOTAL: 0.6 mg/dL (ref 0.2–1.2)
Total Protein: 6.7 g/dL (ref 6.0–8.3)

## 2015-05-16 NOTE — Patient Instructions (Signed)
Medication Instructions:  Your physician recommends that you continue on your current medications as directed. Please refer to the Current Medication list given to you today.  Labwork: Lp/bmet/hfp  Testing/Procedures: none  Follow-Up: Your physician wants you to follow-up in: 6 months with fasting labs (lp/bmet/hfp)  You will receive a reminder letter in the mail two months in advance. If you don't receive a letter, please call our office to schedule the follow-up appointment.    

## 2015-05-16 NOTE — Progress Notes (Signed)
Cardiology Office Note   Date:  05/16/2015   ID:  [name], DOB 1925-10-05, MRN [id number]  PCP:  Leonard Downing, MD  Cardiologist: [name] MD  No chief complaint on file.     History of Present Illness: [name] is a 81 y.o. male who presents for a six-month follow-up visit.  This pleasant 81 year old gentleman is seen for a followup office visit. He has a history of ischemic heart disease. He had angioplasty and stent in 2002. He had another angioplasty in 2003. In November 2009 he had a nuclear stress test which showed no reversible ischemia. On 02/28/11 he had another nuclear stress test as preop clearance for a hip revision procedure and there was no evidence of ischemia but there was an old scar present. Patient has a past history of paroxysmal supraventricular tachycardia. In addition the patient has a history of osteoarthritis. He has had dyslipidemia and is on Crestor. He has had prior bilateral hip replacements. 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  He is considering allowing Dr. Wynelle Link to perform surgery in September.  He has had some memory problems and is on generic Aricept. The patient has occasional episodes of paroxysmal supraventricular tachycardia.  It often occurs if he ends over or strains too hard.  He is able to stop the tachycardia by drinking cold water. The patient has chronic sinus bradycardia.  This is asymptomatic.  He has no dizziness or syncope.  Past Medical History  Diagnosis Date  . Coronary artery disease   . Myocardial infarction     Previous anterolateral myocardial infarction with presistent ST-T wave changes  . Left hip pain     Chronic left hip discomfort  . 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 Tachycardia     History of paroxysmal supraventricular tachycardia  .  Hypercholesterolemia   . History of BPH   . Osteoarthritis   . Hypertension   . Diverticulosis   . SVT (supraventricular tachycardia)   . Tubular adenoma   . Carpal tunnel syndrome   . BPH (benign prostatic hyperplasia)   . IBS (irritable bowel syndrome)   . Kidney stone     Past Surgical History  Procedure Laterality Date  . Cardiac catheterization  05/07/2002    Ejection fraction 20%  . Angioplasty  2002    with stent and angioplasty in 2003  . Hip surgery    . Hip closed reduction  01/31/2012    Procedure: CLOSED MANIPULATION HIP;  Surgeon: [name], MD;  Location: [facility];  Service: Orthopedics;  Laterality: Right;  closed reduction right dislocated hip  . Appendectomy    . Tonsillectomy    . Hip closed reduction  10/10/2012    Procedure: CLOSED MANIPULATION HIP;  Surgeon: [name], MD;  Location: [facility];  Service: Orthopedics;  Laterality: Right;  . Transurethral resection of prostate    . Back surgery    . Finger surgery    . Hip closed reduction Right 03/17/2015    Procedure: CLOSED REDUCTION HIP;  Surgeon: Rod Can, MD;  Location: [facility];  Service: Orthopedics;  Laterality: Right;     Current Outpatient Prescriptions  Medication Sig Dispense Refill  . aspirin EC 81 MG tablet Take 81 mg by mouth daily.    . clopidogrel (PLAVIX) 75 MG tablet Take  1 tablet (75 mg total) by mouth daily. 10 tablet 0  . donepezil (ARICEPT) 10 MG tablet Take 10 mg by mouth at bedtime.    . finasteride (PROSCAR) 5 MG tablet Take 5 mg by mouth daily.    . finasteride (PROSCAR) 5 MG tablet Take 5 mg by mouth daily.    . metoprolol (LOPRESSOR) 50 MG tablet Take 50 mg by mouth daily. Take 50 mg by mouth in the morning and 25 mg by mouth in the evening    . omega-3 acid ethyl esters (LOVAZA) 1 G capsule Take 1 g by mouth 2 (two) times daily.    . rosuvastatin (CRESTOR) 5 MG tablet Take 1 tablet (5 mg total) by mouth at bedtime. 90 tablet 1  . terazosin (HYTRIN) 10 MG capsule Take  1 capsule (10 mg total) by mouth at bedtime. 10 capsule 0  . valsartan-hydrochlorothiazide (DIOVAN-HCT) 160-12.5 MG per tablet Take 1 tablet by mouth daily. 90 tablet 3   No current facility-administered medications for this visit.    Allergies:   Prilosec and Prednisone    Social History:  The patient  reports that he has never smoked. He has never used smokeless tobacco. He reports that he does not drink alcohol or use illicit drugs.   Family History:  The patient's family history includes Alzheimer's disease in his mother; Heart attack in his father; Stroke in his father and mother.    ROS:  Please see the history of present illness.   Otherwise, review of systems are positive for none.   All other systems are reviewed and negative.    PHYSICAL EXAM: VS:  BP 140/62 mmHg  Pulse 40  Ht 5\' 7"  (1.702 m)  Wt 161 lb 1.9 oz (73.084 kg)  BMI 25.23 kg/m2 , BMI Body mass index is 25.23 kg/(m^2). GEN: Well nourished, well developed, in no acute distress HEENT: normal Neck: no JVD, carotid bruits, or masses Cardiac: RRR; no murmurs, rubs, or gallops,no edema  Respiratory:  clear to auscultation bilaterally, normal work of breathing GI: soft, nontender, nondistended, + BS MS: no deformity or atrophy Skin: warm and dry, no rash Neuro:  Strength and sensation are intact Psych: euthymic mood, full affect   EKG:  EKG is ordered today. The ekg ordered today demonstrates marked sinus bradycardia at 40 bpm.  First degree AV block at 220 ms.  Occasional PVCs.  Lateral T-wave abnormalities unchanged.  Since prior tracing of 03/17/15, heart rate is slower   Recent Labs: 03/17/2015: Hemoglobin 12.6* 05/16/2015: ALT 24; BUN 31*; Creatinine 1.43; Potassium 4.3; Sodium 138    Lipid Panel    Component Value Date/Time   CHOL 146 05/16/2015 1149   TRIG 64.0 05/16/2015 1149   HDL 48.10 05/16/2015 1149   CHOLHDL 3 05/16/2015 1149   VLDL 12.8 05/16/2015 1149   LDLCALC 85 05/16/2015 1149      Wt  Readings from Last 3 Encounters:  05/16/15 161 lb 1.9 oz (73.084 kg)  11/14/14 168 lb (76.204 kg)  05/17/14 160 lb (72.576 kg)         ASSESSMENT AND PLAN:  1. ischemic heart disease status post angioplasty and stent in 2002 and again in 2003. Most recent nuclear stress test 2012 showing no ischemia but there was old scar present. 2. Hypercholesterolemia 3. hypertensive heart disease without CHF 4. Marked sinus bradycardia, asymptomatic 5.  History of Recurrent dislocations of right hip. Disposition. Continue same medication.  Recheck in 6 months for office visit lipid panel  hepatic function panel and basal metabolic panel.   Current medicines are reviewed at length with the patient today.  The patient does not have concerns regarding medicines.  The following changes have been made:  no change  Labs/ tests ordered today include:   Orders Placed This Encounter  Procedures  . Lipid panel  . Hepatic function panel  . Basic metabolic panel  . Lipid panel  . Hepatic function panel  . Basic metabolic panel  . EKG 12-Lead      Signed, [name] MD 05/16/2015 5:36 PM    [facility] [place], [place], [place]  [address] Phone: [phone]; Fax: [phone]

## 2015-05-17 NOTE — Progress Notes (Signed)
Quick Note:  Please report to patient. The recent labs are stable. Continue same medication and careful diet. ______ 

## 2015-05-22 ENCOUNTER — Other Ambulatory Visit: Payer: Self-pay

## 2015-05-22 ENCOUNTER — Other Ambulatory Visit: Payer: Self-pay | Admitting: Cardiology

## 2015-05-22 MED ORDER — TERAZOSIN HCL 10 MG PO CAPS: 10.0000 mg | ORAL_CAPSULE | Freq: Every day | ORAL | Status: DC

## 2015-06-03 ENCOUNTER — Other Ambulatory Visit: Payer: Self-pay | Admitting: Cardiology

## 2015-06-06 ENCOUNTER — Other Ambulatory Visit: Payer: Self-pay | Admitting: Cardiology

## 2015-06-06 MED ORDER — CLOPIDOGREL BISULFATE 75 MG PO TABS: 75.0000 mg | ORAL_TABLET | Freq: Every day | ORAL | Status: DC

## 2015-06-06 NOTE — Telephone Encounter (Signed)
Pt is waiting for his medication Plavix 75 mg medication order from [facility] to come and pt needed some medication now. Sent a 30 day supply until his medication is delivered.

## 2015-07-21 ENCOUNTER — Telehealth: Payer: Self-pay

## 2015-07-21 NOTE — Telephone Encounter (Signed)
Pt calling about Crestor and he was wondering is there is another medicine he can take because he heard that this medication leads to memory loss and is not sure he wants to take it. He does have 7 days left.

## 2015-07-21 NOTE — Telephone Encounter (Signed)
There is no good evidence that the statins cause memory problems, but there are anecdotal reports with all the statins, so switching to a different one would not be beneficial at this point.  I would suggest leaving off the Crestor for several months and see if it makes a difference in his memory.  His wife can help him make that judgement. If no change or improvement in his memory off statins we would want to restart a statin because we know it helps his CAD.

## 2015-07-25 NOTE — Addendum Note (Signed)
Addended by: Alvina Filbert B on: 07/25/2015 03:43 PM   Modules accepted: Medications

## 2015-07-25 NOTE — Telephone Encounter (Signed)
Advised wife  

## 2015-08-15 ENCOUNTER — Ambulatory Visit: Payer: Self-pay | Admitting: Orthopedic Surgery

## 2015-08-15 NOTE — H&P (Signed)
Kirstie Peri Tiano DOB: 04-21-1925 Married / Language: English / Race: White Male Date of Admission:  09/06/2015 CC:  Recurrent Dislocations of Right Total Hip History of Present Illness The patient is a 81 year old male who comes in for a preoperative History and Physical. The patient is scheduled for a right total hip arthroplasty (revision) to be performed by Dr. Dione Plover. Aluisio, MD at [facility] on 09/06/2015. The patient is a 81 year old male presenting for a post-operative visit. The patient comes in several months out from closed reduction, prior history of right total hip arthroplasty. The patient states that he is doing okay. Patient states that he is doing well until his hip pops out. He said that it has dislocated several times now. He had a bad experience the last time he went to [facility]. He said he was sitting there for long time without any medications. He has had several dislocations now and has grown tired of it. We previously discussed revision to constrained liner but he was never interested in doing that. His family is pushing him to do it now and he is at a stage now where he wants to get that done, so he is not to worry about dislocating anymore. He is admitted for revision surgery.  They have been treated conservatively in the past for the above stated problem and despite conservative measures, they continue to have recurring dislocations of the hip. They have failed non-operative management and also mulitple closed reductions for dislocations. It is felt that they would benefit from undergoing total joint revision and conversion to a constrained liner. Risks and benefits of the procedure have been discussed with the patient and they elect to proceed with surgery. There are no active contraindications to surgery such as ongoing infection or rapidly progressive neurological disease.  Problem List/Past Medical Cervical spine pain (M54.2) Bilateral knee pain (M25.561,  M25.562) Dislocation of prosthetic joint, subsequent encounter (T84.029D) Primary osteoarthritis of both knees (M17.0) Coronary artery disease High blood pressure Myocardial infarction 2001 Bradycardia  Allergies No Known Drug Allergies  Intolerance Voltaren (Intolerance)09/17/1999 PredniSONE *CORTICOSTEROIDS* fast heart rate  Family History Hypertension father and grandfather fathers side  Social History Alcohol use never consumed alcohol Drug/Alcohol Rehab (Currently) no Illicit drug use no Tobacco use Never smoker. 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 Donepezil HCl (10MG  Tablet, Oral) Active. Crestor (5MG  Tablet, Oral) Active. Metoprolol Tartrate (50MG  Tablet, Oral) Active. Diovan (80MG  Tablet, Oral) Active. Clopidogrel Bisulfate (75MG  Tablet, Oral) Active. Finasteride (5MG  Tablet, Oral) Active. Terazosin HCl (5MG  Capsule, Oral) Active. Aspirin (81MG  Tablet, 1 Oral) Active. Vitamin C (1 Oral) Specific dose unknown - Active. Vitamin D (1 Oral) Specific dose unknown - Active. Vitamin A-Beta Carotene (1 Oral) Specific dose unknown - Active. Celecoxib (200MG  Capsule, Oral) Active. Lovaza (1GM Capsule, Oral) Active.  Past Surgical History Inguinal Hernia Repair open: left Prostatectomy; Transurethral Spinal Surgery Total Hip Replacement left Total Hip Replacement - Right Closed Reduction of Dislocated Total Hip, Right Card. Cath with Heart Stenting Date: 2001.  Review of Systems General Not Present- Chills, Fatigue, Fever, Memory Loss, Night Sweats, Weight Gain and Weight Loss. Skin Not Present- Eczema, Hives, Itching, Lesions and Rash. HEENT Not Present- Dentures, Double  Vision, Headache, Hearing Loss, Tinnitus and Visual Loss. Respiratory Not Present- Allergies, Chronic Cough, Coughing  up blood, Shortness of breath at rest and Shortness of breath with exertion. Cardiovascular Not Present- Chest Pain, Difficulty Breathing Lying Down, Murmur, Palpitations, Racing/skipping heartbeats and Swelling. Gastrointestinal Not Present- Abdominal Pain, Bloody Stool, Constipation, Diarrhea, Difficulty Swallowing, Heartburn, Jaundice, Loss of appetitie, Nausea and Vomiting. Male Genitourinary Not Present- Blood in Urine, Discharge, Flank Pain, Incontinence, Painful Urination, Urgency, Urinary frequency, Urinary Retention, Urinating at Night and Weak urinary stream. Musculoskeletal Present- Joint Pain. Not Present- Back Pain, Joint Swelling, Morning Stiffness, Muscle Pain, Muscle Weakness and Spasms. Neurological Not Present- Blackout spells, Difficulty with balance, Dizziness, Paralysis, Tremor and Weakness. Psychiatric Not Present- Insomnia.  Vitals Weight: 160 lb Height: 66in Weight was reported by patient. Height was reported by patient. Body Surface Area: 1.82 m Body Mass Index: 25.82 kg/m  Pulse: 50 (Regular)  BP: 114/62 (Sitting, Right Arm, Standard)  Physical Exam General Mental Status -Alert, cooperative and good historian. General Appearance-pleasant, Not in acute distress. Orientation-Oriented X3. Build & Nutrition-Well nourished and Well developed.  Head and Neck Head-normocephalic, atraumatic . Neck Global Assessment - supple, no bruit auscultated on the right, no bruit auscultated on the left. Carotid Arteries - Bilateral - bruit(soft bruits likely referred from chest).  Eye Pupil - Bilateral-Regular and Round. Motion - Bilateral-EOMI.  Chest and Lung Exam Auscultation Breath sounds - clear at anterior chest wall and clear at posterior chest wall. Adventitious sounds - No Adventitious  sounds.  Cardiovascular Auscultation Rhythm - Regular rate and rhythm. Heart Sounds - S1 WNL and S2 WNL. Murmurs & Other Heart Sounds: Murmur 1 - Location - Aortic Area. Timing - Mid-systolic. Grade - II/VI. Character - Low pitched.  Abdomen Palpation/Percussion Tenderness - Abdomen is non-tender to palpation. Rigidity (guarding) - Abdomen is soft. Auscultation Auscultation of the abdomen reveals - Bowel sounds normal.  Male Genitourinary Note: Not done, not pertinent to present illness  Musculoskeletal Upper Extremity -Note: mulitple bruises noted on blth upper arms, attributes to the Plavix medication he is taking.  Note: He is in no distress. His right hip can be flexed to 110, rotating in 20, out 30, abducted 30, without discomfort. There is no tenderness on palpation and no pain on range of motion.  Assessment & Plan Dislocation of prosthetic joint, subsequent encounter (A07.622Q) Story: right Note:Surgical Plans: Right Total Hip Revision - Conversion to Constrained Liner  Disposition: Home  Cards - Dr. Mare Ferrari - Patient has been seen preoperatively and felt to be stable for surgery.  Topical TXA - History of CAD, MI  Anesthesia Issues: None  Signed electronically by Joelene Millin, III PA-C

## 2015-08-15 NOTE — Progress Notes (Signed)
Preoperative surgical orders have been place into the Epic hospital system for [name] on 08/15/2015, 11:07 AM  by Mickel Crow for surgery on 09/06/2015.  Preop Total Hip orders including Experel Injecion, IV Tylenol, and IV Decadron as long as there are no contraindications to the above medications. Arlee Muslim, PA-C

## 2015-08-22 ENCOUNTER — Ambulatory Visit: Payer: Self-pay | Admitting: Orthopedic Surgery

## 2015-08-22 ENCOUNTER — Encounter (HOSPITAL_COMMUNITY): Payer: Self-pay | Admitting: Pharmacist

## 2015-08-30 NOTE — Patient Instructions (Addendum)
YOUR PROCEDURE IS SCHEDULED ON : 09/06/15  REPORT TO [facility] MAIN ENTRANCE FOLLOW SIGNS TO EAST ELEVATOR - GO TO 3rd FLOOR CHECK IN AT 3 EAST NURSES STATION (SHORT STAY) AT:  11:00 AM  CALL THIS NUMBER IF YOU HAVE PROBLEMS THE MORNING OF SURGERY [phone]  REMEMBER:ONLY 1 PER PERSON MAY GO TO SHORT STAY WITH YOU TO GET READY THE MORNING OF YOUR SURGERY  DO NOT EAT FOOD  AFTER MIDNIGHT  MAY HAVE CLEAR LIQUIDS UNTIL 7:00 AM  TAKE THESE MEDICINES THE MORNING OF SURGERY:  METOPROLOL  CLEAR LIQUID DIET  Foods Allowed                                                                     Foods Excluded  Coffee and tea, regular and decaf                             liquids that you cannot  Plain Jell-O in any flavor                                             see through such as: Fruit ices (not with fruit pulp)                                     milk, soups, orange juice  Iced Popsicles                                                All solid food Carbonated beverages, regular and diet                                    Cranberry, grape and apple juices Sports drinks like Gatorade Lightly seasoned clear broth or consume(fat free) Sugar, honey syrup  BRING C-PAP TUBING AND MASK TO HOSPITAL  _____________________________________________________________________    YOU MAY NOT HAVE ANY METAL ON YOUR BODY INCLUDING HAIR PINS AND PIERCING'S. DO NOT WEAR JEWELRY, MAKEUP, LOTIONS, POWDERS OR PERFUMES. DO NOT WEAR NAIL POLISH. DO NOT SHAVE 48 HRS PRIOR TO SURGERY. MEN MAY SHAVE FACE AND NECK.  DO NOT [place]. [facility] IS NOT RESPONSIBLE FOR VALUABLES.  CONTACTS, DENTURES OR PARTIALS MAY NOT BE WORN TO SURGERY. LEAVE SUITCASE IN CAR. CAN BE BROUGHT TO ROOM AFTER SURGERY.  PATIENTS DISCHARGED THE DAY OF SURGERY WILL NOT BE ALLOWED TO DRIVE HOME.  PLEASE READ OVER THE FOLLOWING INSTRUCTION  SHEETS _________________________________________________________________________________                                          [facility] - PREPARING FOR SURGERY  Before surgery, you can play an important role.  Because skin is not sterile, your skin needs to be as free of germs as possible.  You can reduce the number of germs on your skin by washing with CHG (chlorahexidine gluconate) soap before surgery.  CHG is an antiseptic cleaner which kills germs and bonds with the skin to continue killing germs even after washing. Please DO NOT use if you have an allergy to CHG or antibacterial soaps.  If your skin becomes reddened/irritated stop using the CHG and inform your nurse when you arrive at Short Stay. Do not shave (including legs and underarms) for at least 48 hours prior to the first CHG shower.  You may shave your face. Please follow these instructions carefully:   1.  Shower with CHG Soap the night before surgery and the  morning of Surgery.   2.  If you choose to wash your hair, wash your hair first as usual with your  normal  Shampoo.   3.  After you shampoo, rinse your hair and body thoroughly to remove the  shampoo.                                         4.  Use CHG as you would any other liquid soap.  You can apply chg directly  to the skin and wash . Gently wash with scrungie or clean wascloth    5.  Apply the CHG Soap to your body ONLY FROM THE NECK DOWN.   Do not use on open                           Wound or open sores. Avoid contact with eyes, ears mouth and genitals (private parts).                        Genitals (private parts) with your normal soap.              6.  Wash thoroughly, paying special attention to the area where your surgery  will be performed.   7.  Thoroughly rinse your body with warm water from the neck down.   8.  DO NOT shower/wash with your normal soap after using and rinsing off  the CHG Soap .                9.  Pat yourself dry with a clean  towel.             10.  Wear clean night clothes to bed after shower             11.  Place clean sheets on your bed the night of your first shower and do not  sleep with pets.  Day of Surgery : Do not apply any lotions/deodorants the morning of surgery.  Please wear clean clothes to the hospital/surgery center.  FAILURE TO FOLLOW THESE INSTRUCTIONS MAY RESULT IN THE CANCELLATION OF YOUR SURGERY    PATIENT SIGNATURE_________________________________  ______________________________________________________________________     [name]  An incentive spirometer is a tool that can help keep your lungs clear and active. This tool measures how well you are filling your lungs with each breath. Taking long deep breaths may help reverse or decrease the chance of developing breathing (pulmonary) problems (especially infection) following:  A long period of time when you are unable to move  or be active. BEFORE THE PROCEDURE   If the spirometer includes an indicator to show your best effort, your nurse or respiratory therapist will set it to a desired goal.  If possible, sit up straight or lean slightly forward. Try not to slouch.  Hold the incentive spirometer in an upright position. INSTRUCTIONS FOR USE   Sit on the edge of your bed if possible, or sit up as far as you can in bed or on a chair.  Hold the incentive spirometer in an upright position.  Breathe out normally.  Place the mouthpiece in your mouth and seal your lips tightly around it.  Breathe in slowly and as deeply as possible, raising the piston or the ball toward the top of the column.  Hold your breath for 3-5 seconds or for as long as possible. Allow the piston or ball to fall to the bottom of the column.  Remove the mouthpiece from your mouth and breathe out normally.  Rest for a few seconds and repeat Steps 1 through 7 at least 10 times every 1-2 hours when you are awake. Take your time and take a few  normal breaths between deep breaths.  The spirometer may include an indicator to show your best effort. Use the indicator as a goal to work toward during each repetition.  After each set of 10 deep breaths, practice coughing to be sure your lungs are clear. If you have an incision (the cut made at the time of surgery), support your incision when coughing by placing a pillow or rolled up towels firmly against it. Once you are able to get out of bed, walk around indoors and cough well. You may stop using the incentive spirometer when instructed by your caregiver.  RISKS AND COMPLICATIONS  Take your time so you do not get dizzy or light-headed.  If you are in pain, you may need to take or ask for pain medication before doing incentive spirometry. It is harder to take a deep breath if you are having pain. AFTER USE  Rest and breathe slowly and easily.  It can be helpful to keep track of a log of your progress. Your caregiver can provide you with a simple table to help with this. If you are using the spirometer at home, follow these instructions: [place] IF:   You are having difficultly using the spirometer.  You have trouble using the spirometer as often as instructed.  Your pain medication is not giving enough relief while using the spirometer.  You develop fever of 100.5 F (38.1 C) or higher. SEEK IMMEDIATE MEDICAL CARE IF:   You cough up bloody sputum that had not been present before.  You develop fever of 102 F (38.9 C) or greater.  You develop worsening pain at or near the incision site. MAKE SURE YOU:   Understand these instructions.  Will watch your condition.  Will get help right away if you are not doing well or get worse. Document Released: 04/14/2007 Document Revised: 02/24/2012 Document Reviewed: 06/15/2007 ExitCare Patient Information 2014 Exit[employer], [place].   ________________________________________________________________________  WHAT IS A BLOOD  TRANSFUSION? Blood Transfusion Information  A transfusion is the replacement of blood or some of its parts. Blood is made up of multiple cells which provide different functions.  Red blood cells carry oxygen and are used for blood loss replacement.  White blood cells fight against infection.  Platelets control bleeding.  Plasma helps clot blood.  Other blood products are available for  specialized needs, such as hemophilia or other clotting disorders. BEFORE THE TRANSFUSION  Who gives blood for transfusions?   Healthy volunteers who are fully evaluated to make sure their blood is safe. This is blood bank blood. Transfusion therapy is the safest it has ever been in the practice of medicine. Before blood is taken from a donor, a complete history is taken to make sure that person has no history of diseases nor engages in risky social behavior (examples are intravenous drug use or sexual activity with multiple partners). The donor's travel history is screened to minimize risk of transmitting infections, such as malaria. The donated blood is tested for signs of infectious diseases, such as HIV and hepatitis. The blood is then tested to be sure it is compatible with you in order to minimize the chance of a transfusion reaction. If you or a relative donates blood, this is often done in anticipation of surgery and is not appropriate for emergency situations. It takes many days to process the donated blood. RISKS AND COMPLICATIONS Although transfusion therapy is very safe and saves many lives, the main dangers of transfusion include:   Getting an infectious disease.  Developing a transfusion reaction. This is an allergic reaction to something in the blood you were given. Every precaution is taken to prevent this. The decision to have a blood transfusion has been considered carefully by your caregiver before blood is given. Blood is not given unless the benefits outweigh the risks. AFTER THE  TRANSFUSION  Right after receiving a blood transfusion, you will usually feel much better and more energetic. This is especially true if your red blood cells have gotten low (anemic). The transfusion raises the level of the red blood cells which carry oxygen, and this usually causes an energy increase.  The nurse administering the transfusion will monitor you carefully for complications. HOME CARE INSTRUCTIONS  No special instructions are needed after a transfusion. You may find your energy is better. Speak with your caregiver about any limitations on activity for underlying diseases you may have. SEEK MEDICAL CARE IF:   Your condition is not improving after your transfusion.  You develop redness or irritation at the intravenous (IV) site. SEEK IMMEDIATE MEDICAL CARE IF:  Any of the following symptoms occur over the next 12 hours:  Shaking chills.  You have a temperature by mouth above 102 F (38.9 C), not controlled by medicine.  Chest, back, or muscle pain.  People around you feel you are not acting correctly or are confused.  Shortness of breath or difficulty breathing.  Dizziness and fainting.  You get a rash or develop hives.  You have a decrease in urine output.  Your urine turns a dark color or changes to pink, red, or brown. Any of the following symptoms occur over the next 10 days:  You have a temperature by mouth above 102 F (38.9 C), not controlled by medicine.  Shortness of breath.  Weakness after normal activity.  The white part of the eye turns yellow (jaundice).  You have a decrease in the amount of urine or are urinating less often.  Your urine turns a dark color or changes to pink, red, or brown. Document Released: 11/29/2000 Document Revised: 02/24/2012 Document Reviewed: 07/18/2008 [facility] Patient Information 2014 [place], [place].  _______________________________________________________________________

## 2015-08-31 ENCOUNTER — Encounter (HOSPITAL_COMMUNITY)
Admission: RE | Admit: 2015-08-31 | Discharge: 2015-08-31 | Disposition: A | Discharge status: Home / Self Care | Payer: Medicare Other | Source: Ambulatory Visit | Attending: Orthopedic Surgery | Admitting: Orthopedic Surgery

## 2015-08-31 ENCOUNTER — Encounter (HOSPITAL_COMMUNITY): Payer: Self-pay

## 2015-08-31 DIAGNOSIS — Z01818 Encounter for other preprocedural examination: Secondary | ICD-10-CM | POA: Insufficient documentation

## 2015-08-31 HISTORY — DX: Bradycardia, unspecified: R00.1

## 2015-08-31 HISTORY — DX: Sleep apnea, unspecified: G47.30

## 2015-08-31 HISTORY — DX: Gastro-esophageal reflux disease without esophagitis: K21.9

## 2015-08-31 LAB — URINALYSIS, ROUTINE W REFLEX MICROSCOPIC
BILIRUBIN URINE: NEGATIVE
GLUCOSE, UA: NEGATIVE mg/dL
HGB URINE DIPSTICK: NEGATIVE
Ketones, ur: NEGATIVE mg/dL
Leukocytes, UA: NEGATIVE
Nitrite: NEGATIVE
PROTEIN: NEGATIVE mg/dL
SPECIFIC GRAVITY, URINE: 1.011 (ref 1.005–1.030)
Urobilinogen, UA: 0.2 mg/dL (ref 0.0–1.0)
pH: 5 (ref 5.0–8.0)

## 2015-08-31 LAB — CBC
HCT: 37 % — ABNORMAL LOW (ref 39.0–52.0)
HEMOGLOBIN: 12.2 g/dL — AB (ref 13.0–17.0)
MCH: 31 pg (ref 26.0–34.0)
MCHC: 33 g/dL (ref 30.0–36.0)
MCV: 94.1 fL (ref 78.0–100.0)
PLATELETS: 139 10*3/uL — AB (ref 150–400)
RBC: 3.93 MIL/uL — ABNORMAL LOW (ref 4.22–5.81)
RDW: 13.4 % (ref 11.5–15.5)
WBC: 6 10*3/uL (ref 4.0–10.5)

## 2015-08-31 LAB — COMPREHENSIVE METABOLIC PANEL
ALBUMIN: 3.8 g/dL (ref 3.5–5.0)
ALK PHOS: 82 U/L (ref 38–126)
ALT: 18 U/L (ref 17–63)
ANION GAP: 5 (ref 5–15)
AST: 31 U/L (ref 15–41)
BUN: 27 mg/dL — ABNORMAL HIGH (ref 6–20)
CALCIUM: 9.3 mg/dL (ref 8.9–10.3)
CO2: 29 mmol/L (ref 22–32)
Chloride: 107 mmol/L (ref 101–111)
Creatinine, Ser: 1.47 mg/dL — ABNORMAL HIGH (ref 0.61–1.24)
GFR calc non Af Amer: 41 mL/min — ABNORMAL LOW (ref >60)
GFR, EST AFRICAN AMERICAN: 48 mL/min — AB (ref >60)
GLUCOSE: 118 mg/dL — AB (ref 65–99)
POTASSIUM: 5.3 mmol/L — AB (ref 3.5–5.1)
SODIUM: 141 mmol/L (ref 135–145)
Total Bilirubin: 0.5 mg/dL (ref 0.3–1.2)
Total Protein: 6.6 g/dL (ref 6.5–8.1)

## 2015-08-31 LAB — SURGICAL PCR SCREEN
MRSA, PCR: NEGATIVE
Staphylococcus aureus: NEGATIVE

## 2015-08-31 LAB — PROTIME-INR
INR: 1.17 (ref 0.00–1.49)
Prothrombin Time: 15.1 seconds (ref 11.6–15.2)

## 2015-08-31 LAB — APTT: APTT: 27 s (ref 24–37)

## 2015-08-31 NOTE — Progress Notes (Signed)
Abnormal CBC / CMET faxed to Dr.Aluisio

## 2015-09-05 NOTE — H&P (Signed)
[name] DOB: 10-08-1925 Married / Language: English / Race: White Male Date of Admission: 09/06/2015 CC: Recurrent Dislocations of Right Total Hip History of Present Illness The patient is a 81 year old male who comes in for a preoperative History and Physical. The patient is scheduled for a right total hip arthroplasty (revision) to be performed by Dr. Dione Plover. Aluisio, MD at [facility] on 09/06/2015. The patient is a 81 year old male presenting for a post-operative visit. The patient comes in several months out from closed reduction, prior history of right total hip arthroplasty. The patient states that he is doing okay. Patient states that he is doing well until his hip pops out. He said that it has dislocated several times now. He had a bad experience the last time he went to [facility]. He said he was sitting there for long time without any medications. He has had several dislocations now and has grown tired of it. We previously discussed revision to constrained liner but he was never interested in doing that. His family is pushing him to do it now and he is at a stage now where he wants to get that done, so he is not to worry about dislocating anymore. He is admitted for revision surgery.  They have been treated conservatively in the past for the above stated problem and despite conservative measures, they continue to have recurring dislocations of the hip. They have failed non-operative management and also mulitple closed reductions for dislocations. It is felt that they would benefit from undergoing total joint revision and conversion to a constrained liner. Risks and benefits of the procedure have been discussed with the patient and they elect to proceed with surgery. There are no active contraindications to surgery such as ongoing infection or rapidly progressive neurological disease.  Problem List/Past Medical Cervical spine pain (M54.2) Bilateral knee pain (M25.561,  M25.562) Dislocation of prosthetic joint, subsequent encounter (T84.029D) Primary osteoarthritis of both knees (M17.0) Coronary artery disease High blood pressure Myocardial infarction 2001 Bradycardia  Allergies No Known Drug Allergies  Intolerance Voltaren (Intolerance)09/17/1999 PredniSONE *CORTICOSTEROIDS* fast heart rate  Family History Hypertension father and grandfather fathers side  Social History Alcohol use never consumed alcohol Drug/Alcohol Rehab (Currently) no Illicit drug use no Tobacco use Never smoker. 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 Donepezil HCl (10MG  Tablet, Oral) Active. Crestor (5MG  Tablet, Oral) Active. Metoprolol Tartrate (50MG  Tablet, Oral) Active. Diovan (80MG  Tablet, Oral) Active. Clopidogrel Bisulfate (75MG  Tablet, Oral) Active. Finasteride (5MG  Tablet, Oral) Active. Terazosin HCl (5MG  Capsule, Oral) Active. Aspirin (81MG  Tablet, 1 Oral) Active. Vitamin C (1 Oral) Specific dose unknown - Active. Vitamin D (1 Oral) Specific dose unknown - Active. Vitamin A-Beta Carotene (1 Oral) Specific dose unknown - Active. Celecoxib (200MG  Capsule, Oral) Active. Lovaza (1GM Capsule, Oral) Active.  Past Surgical History Inguinal Hernia Repair open: left Prostatectomy; Transurethral Spinal Surgery Total Hip Replacement left Total Hip Replacement - Right Closed Reduction of Dislocated Total Hip, Right Card. Cath with Heart Stenting Date: 2001.  Review of Systems General Not Present- Chills, Fatigue, Fever, Memory Loss, Night Sweats, Weight Gain and Weight Loss. Skin Not Present- Eczema, Hives, Itching, Lesions and Rash. HEENT Not Present- Dentures, Double  Vision, Headache, Hearing Loss, Tinnitus and Visual Loss. Respiratory Not Present- Allergies, Chronic Cough, Coughing up blood,  Shortness of breath at rest and Shortness of breath with exertion. Cardiovascular Not Present- Chest Pain, Difficulty Breathing Lying Down, Murmur, Palpitations, Racing/skipping heartbeats and Swelling. Gastrointestinal Not Present- Abdominal Pain, Bloody Stool, Constipation, Diarrhea, Difficulty Swallowing, Heartburn, Jaundice, Loss of appetitie, Nausea and Vomiting. Male Genitourinary Not Present- Blood in Urine, Discharge, Flank Pain, Incontinence, Painful Urination, Urgency, Urinary frequency, Urinary Retention, Urinating at Night and Weak urinary stream. Musculoskeletal Present- Joint Pain. Not Present- Back Pain, Joint Swelling, Morning Stiffness, Muscle Pain, Muscle Weakness and Spasms. Neurological Not Present- Blackout spells, Difficulty with balance, Dizziness, Paralysis, Tremor and Weakness. Psychiatric Not Present- Insomnia.  Vitals Weight: 160 lb Height: 66in Weight was reported by patient. Height was reported by patient. Body Surface Area: 1.82 m Body Mass Index: 25.82 kg/m  Pulse: 50 (Regular)  BP: 114/62 (Sitting, Right Arm, Standard)  Physical Exam General Mental Status -Alert, cooperative and good historian. General Appearance-pleasant, Not in acute distress. Orientation-Oriented X3. Build & Nutrition-Well nourished and Well developed.  Head and Neck Head-normocephalic, atraumatic . Neck Global Assessment - supple, no bruit auscultated on the right, no bruit auscultated on the left. Carotid Arteries - Bilateral - bruit(soft bruits likely referred from chest).  Eye Pupil - Bilateral-Regular and Round. Motion - Bilateral-EOMI.  Chest and Lung Exam Auscultation Breath sounds - clear at anterior chest wall and clear at posterior chest wall. Adventitious sounds - No Adventitious  sounds.  Cardiovascular Auscultation Rhythm - Regular rate and rhythm. Heart Sounds - S1 WNL and S2 WNL. Murmurs & Other Heart Sounds: Murmur 1 - Location - Aortic Area. Timing - Mid-systolic. Grade - II/VI. Character - Low pitched.  Abdomen Palpation/Percussion Tenderness - Abdomen is non-tender to palpation. Rigidity (guarding) - Abdomen is soft. Auscultation Auscultation of the abdomen reveals - Bowel sounds normal.  Male Genitourinary Note: Not done, not pertinent to present illness  Musculoskeletal Upper Extremity -Note: mulitple bruises noted on blth upper arms, attributes to the Plavix medication he is taking.  Note: He is in no distress. His right hip can be flexed to 110, rotating in 20, out 30, abducted 30, without discomfort. There is no tenderness on palpation and no pain on range of motion.  Assessment & Plan Dislocation of prosthetic joint, subsequent encounter (V77.939Q) Story: right Note:Surgical Plans: Right Total Hip Revision - Conversion to Constrained Liner  Disposition: Home  Cards - Dr. Mare Ferrari - Patient has been seen preoperatively and felt to be stable for surgery.  Topical TXA - History of CAD, MI  Anesthesia Issues: None  Signed electronically by Joelene Millin, III PA-C

## 2015-09-06 ENCOUNTER — Inpatient Hospital Stay (HOSPITAL_COMMUNITY): Payer: Medicare Other | Admitting: Anesthesiology

## 2015-09-06 ENCOUNTER — Inpatient Hospital Stay (HOSPITAL_COMMUNITY)
Admission: RE | Admit: 2015-09-06 | Discharge: 2015-09-08 | DRG: 467 | Disposition: A | Discharge status: Home Health | Payer: Medicare Other | Source: Ambulatory Visit | Attending: Orthopedic Surgery | Admitting: Orthopedic Surgery

## 2015-09-06 ENCOUNTER — Encounter (HOSPITAL_COMMUNITY): Payer: Self-pay | Admitting: *Deleted

## 2015-09-06 ENCOUNTER — Inpatient Hospital Stay (HOSPITAL_COMMUNITY): Payer: Medicare Other

## 2015-09-06 ENCOUNTER — Encounter (HOSPITAL_COMMUNITY)
Admission: RE | Disposition: A | Discharge status: Home Health | Payer: Self-pay | Source: Ambulatory Visit | Attending: Orthopedic Surgery

## 2015-09-06 DIAGNOSIS — Z96649 Presence of unspecified artificial hip joint: Secondary | ICD-10-CM

## 2015-09-06 DIAGNOSIS — Y792 Prosthetic and other implants, materials and accessory orthopedic devices associated with adverse incidents: Secondary | ICD-10-CM | POA: Diagnosis present

## 2015-09-06 DIAGNOSIS — E780 Pure hypercholesterolemia: Secondary | ICD-10-CM | POA: Diagnosis present

## 2015-09-06 DIAGNOSIS — Z888 Allergy status to other drugs, medicaments and biological substances status: Secondary | ICD-10-CM | POA: Diagnosis not present

## 2015-09-06 DIAGNOSIS — I10 Essential (primary) hypertension: Secondary | ICD-10-CM | POA: Diagnosis present

## 2015-09-06 DIAGNOSIS — Z87442 Personal history of urinary calculi: Secondary | ICD-10-CM

## 2015-09-06 DIAGNOSIS — G4730 Sleep apnea, unspecified: Secondary | ICD-10-CM | POA: Diagnosis present

## 2015-09-06 DIAGNOSIS — Z7982 Long term (current) use of aspirin: Secondary | ICD-10-CM | POA: Diagnosis not present

## 2015-09-06 DIAGNOSIS — M24451 Recurrent dislocation, right hip: Secondary | ICD-10-CM | POA: Diagnosis present

## 2015-09-06 DIAGNOSIS — K589 Irritable bowel syndrome without diarrhea: Secondary | ICD-10-CM | POA: Diagnosis present

## 2015-09-06 DIAGNOSIS — Z96643 Presence of artificial hip joint, bilateral: Secondary | ICD-10-CM | POA: Diagnosis present

## 2015-09-06 DIAGNOSIS — T84020A Dislocation of internal right hip prosthesis, initial encounter: Secondary | ICD-10-CM | POA: Diagnosis present

## 2015-09-06 DIAGNOSIS — Z886 Allergy status to analgesic agent status: Secondary | ICD-10-CM | POA: Diagnosis not present

## 2015-09-06 DIAGNOSIS — Z8249 Family history of ischemic heart disease and other diseases of the circulatory system: Secondary | ICD-10-CM

## 2015-09-06 DIAGNOSIS — M170 Bilateral primary osteoarthritis of knee: Secondary | ICD-10-CM | POA: Diagnosis present

## 2015-09-06 DIAGNOSIS — I2510 Atherosclerotic heart disease of native coronary artery without angina pectoris: Secondary | ICD-10-CM | POA: Diagnosis present

## 2015-09-06 DIAGNOSIS — Z79899 Other long term (current) drug therapy: Secondary | ICD-10-CM

## 2015-09-06 DIAGNOSIS — T84098A Other mechanical complication of other internal joint prosthesis, initial encounter: Secondary | ICD-10-CM

## 2015-09-06 DIAGNOSIS — K219 Gastro-esophageal reflux disease without esophagitis: Secondary | ICD-10-CM | POA: Diagnosis present

## 2015-09-06 DIAGNOSIS — I252 Old myocardial infarction: Secondary | ICD-10-CM | POA: Diagnosis not present

## 2015-09-06 HISTORY — DX: Presence of unspecified artificial hip joint: Z96.649

## 2015-09-06 HISTORY — PX: TOTAL HIP REVISION: SHX763

## 2015-09-06 LAB — TYPE AND SCREEN
ABO/RH(D): O POS
ANTIBODY SCREEN: NEGATIVE

## 2015-09-06 SURGERY — TOTAL HIP REVISION
Anesthesia: Spinal | Site: Hip | Laterality: Right

## 2015-09-06 MED ORDER — BUPIVACAINE LIPOSOME 1.3 % IJ SUSP
20.0000 mL | Freq: Once | INTRAMUSCULAR | Status: DC
  Filled 2015-09-06: qty 20

## 2015-09-06 MED ORDER — METOPROLOL TARTRATE 25 MG PO TABS
25.0000 mg | ORAL_TABLET | Freq: Every day | ORAL | Status: DC
  Administered 2015-09-06: 25 mg via ORAL
  Filled 2015-09-06 (×2): qty 1

## 2015-09-06 MED ORDER — METHOCARBAMOL 500 MG PO TABS: 500.0000 mg | ORAL_TABLET | Freq: Four times a day (QID) | ORAL | Status: DC | PRN

## 2015-09-06 MED ORDER — GLYCOPYRROLATE 0.2 MG/ML IJ SOLN
INTRAMUSCULAR | Status: AC
  Filled 2015-09-06: qty 2

## 2015-09-06 MED ORDER — PROPOFOL 10 MG/ML IV BOLUS
INTRAVENOUS | Status: DC | PRN
  Administered 2015-09-06 (×2): 10 mg via INTRAVENOUS

## 2015-09-06 MED ORDER — METHOCARBAMOL 1000 MG/10ML IJ SOLN
500.0000 mg | Freq: Four times a day (QID) | INTRAMUSCULAR | Status: DC | PRN
  Filled 2015-09-06: qty 5

## 2015-09-06 MED ORDER — VALSARTAN-HYDROCHLOROTHIAZIDE 160-12.5 MG PO TABS: 1.0000 | ORAL_TABLET | Freq: Every day | ORAL | Status: DC

## 2015-09-06 MED ORDER — ACETAMINOPHEN 325 MG PO TABS
650.0000 mg | ORAL_TABLET | Freq: Four times a day (QID) | ORAL | Status: DC | PRN
  Administered 2015-09-08: 650 mg via ORAL
  Filled 2015-09-06: qty 2

## 2015-09-06 MED ORDER — ONDANSETRON HCL 4 MG/2ML IJ SOLN
INTRAMUSCULAR | Status: DC | PRN
  Administered 2015-09-06: 4 mg via INTRAVENOUS

## 2015-09-06 MED ORDER — DIPHENHYDRAMINE HCL 12.5 MG/5ML PO ELIX: 12.5000 mg | ORAL_SOLUTION | ORAL | Status: DC | PRN

## 2015-09-06 MED ORDER — PROPOFOL 10 MG/ML IV BOLUS
INTRAVENOUS | Status: AC
  Filled 2015-09-06: qty 20

## 2015-09-06 MED ORDER — DONEPEZIL HCL 10 MG PO TABS
10.0000 mg | ORAL_TABLET | Freq: Every day | ORAL | Status: DC
  Administered 2015-09-06 – 2015-09-07 (×2): 10 mg via ORAL
  Filled 2015-09-06 (×3): qty 1

## 2015-09-06 MED ORDER — BISACODYL 10 MG RE SUPP: 10.0000 mg | Freq: Every day | RECTAL | Status: DC | PRN

## 2015-09-06 MED ORDER — CEFAZOLIN SODIUM-DEXTROSE 2-3 GM-% IV SOLR
INTRAVENOUS | Status: AC
  Filled 2015-09-06: qty 50

## 2015-09-06 MED ORDER — ONDANSETRON HCL 4 MG/2ML IJ SOLN: 4.0000 mg | Freq: Four times a day (QID) | INTRAMUSCULAR | Status: DC | PRN

## 2015-09-06 MED ORDER — BUPIVACAINE HCL 0.25 % IJ SOLN
INTRAMUSCULAR | Status: DC | PRN
  Administered 2015-09-06: 30 mL

## 2015-09-06 MED ORDER — DOCUSATE SODIUM 100 MG PO CAPS
100.0000 mg | ORAL_CAPSULE | Freq: Two times a day (BID) | ORAL | Status: DC
  Administered 2015-09-07 – 2015-09-08 (×3): 100 mg via ORAL

## 2015-09-06 MED ORDER — PROPOFOL INFUSION 10 MG/ML OPTIME
INTRAVENOUS | Status: DC | PRN
  Administered 2015-09-06: 50 ug/kg/min via INTRAVENOUS

## 2015-09-06 MED ORDER — SODIUM CHLORIDE 0.9 % IJ SOLN
INTRAMUSCULAR | Status: AC
  Filled 2015-09-06: qty 50

## 2015-09-06 MED ORDER — MENTHOL 3 MG MT LOZG: 1.0000 | LOZENGE | OROMUCOSAL | Status: DC | PRN

## 2015-09-06 MED ORDER — CEFAZOLIN SODIUM-DEXTROSE 2-3 GM-% IV SOLR
2.0000 g | Freq: Four times a day (QID) | INTRAVENOUS | Status: AC
  Administered 2015-09-06 – 2015-09-07 (×2): 2 g via INTRAVENOUS
  Filled 2015-09-06 (×2): qty 50

## 2015-09-06 MED ORDER — ACETAMINOPHEN 500 MG PO TABS
1000.0000 mg | ORAL_TABLET | Freq: Four times a day (QID) | ORAL | Status: AC
  Administered 2015-09-06 – 2015-09-07 (×4): 1000 mg via ORAL
  Filled 2015-09-06 (×4): qty 2

## 2015-09-06 MED ORDER — ACETAMINOPHEN 650 MG RE SUPP: 650.0000 mg | Freq: Four times a day (QID) | RECTAL | Status: DC | PRN

## 2015-09-06 MED ORDER — GLYCOPYRROLATE 0.2 MG/ML IJ SOLN
INTRAMUSCULAR | Status: DC | PRN
  Administered 2015-09-06: 0.2 mg via INTRAVENOUS
  Administered 2015-09-06 (×2): 0.1 mg via INTRAVENOUS

## 2015-09-06 MED ORDER — HYDROCHLOROTHIAZIDE 12.5 MG PO CAPS
12.5000 mg | ORAL_CAPSULE | Freq: Every day | ORAL | Status: DC
  Filled 2015-09-06 (×2): qty 1

## 2015-09-06 MED ORDER — BUPIVACAINE HCL (PF) 0.25 % IJ SOLN
INTRAMUSCULAR | Status: AC
  Filled 2015-09-06: qty 30

## 2015-09-06 MED ORDER — DEXAMETHASONE SODIUM PHOSPHATE 10 MG/ML IJ SOLN
INTRAMUSCULAR | Status: AC
  Filled 2015-09-06: qty 1

## 2015-09-06 MED ORDER — FENTANYL CITRATE (PF) 100 MCG/2ML IJ SOLN
INTRAMUSCULAR | Status: DC | PRN
  Administered 2015-09-06: 50 ug via INTRAVENOUS

## 2015-09-06 MED ORDER — ROSUVASTATIN CALCIUM 5 MG PO TABS
5.0000 mg | ORAL_TABLET | Freq: Every day | ORAL | Status: DC
  Administered 2015-09-06 – 2015-09-07 (×2): 5 mg via ORAL
  Filled 2015-09-06 (×3): qty 1

## 2015-09-06 MED ORDER — FENTANYL CITRATE (PF) 100 MCG/2ML IJ SOLN: 25.0000 ug | INTRAMUSCULAR | Status: DC | PRN

## 2015-09-06 MED ORDER — FENTANYL CITRATE (PF) 100 MCG/2ML IJ SOLN
INTRAMUSCULAR | Status: AC
  Filled 2015-09-06: qty 4

## 2015-09-06 MED ORDER — LIDOCAINE HCL (CARDIAC) 20 MG/ML IV SOLN
INTRAVENOUS | Status: AC
  Filled 2015-09-06: qty 5

## 2015-09-06 MED ORDER — TRAMADOL HCL 50 MG PO TABS: 50.0000 mg | ORAL_TABLET | Freq: Four times a day (QID) | ORAL | Status: DC | PRN

## 2015-09-06 MED ORDER — SODIUM CHLORIDE 0.9 % IV SOLN
INTRAVENOUS | Status: DC
  Administered 2015-09-06: 1000 mL via INTRAVENOUS

## 2015-09-06 MED ORDER — PROMETHAZINE HCL 25 MG/ML IJ SOLN: 6.2500 mg | INTRAMUSCULAR | Status: DC | PRN

## 2015-09-06 MED ORDER — METOCLOPRAMIDE HCL 10 MG PO TABS
5.0000 mg | ORAL_TABLET | Freq: Three times a day (TID) | ORAL | Status: DC | PRN
Start: 2015-09-06 — End: 2015-09-08

## 2015-09-06 MED ORDER — ACETAMINOPHEN 10 MG/ML IV SOLN
INTRAVENOUS | Status: AC
  Filled 2015-09-06: qty 100

## 2015-09-06 MED ORDER — DEXAMETHASONE SODIUM PHOSPHATE 10 MG/ML IJ SOLN: 10.0000 mg | Freq: Once | INTRAMUSCULAR | Status: DC

## 2015-09-06 MED ORDER — TERAZOSIN HCL 5 MG PO CAPS
10.0000 mg | ORAL_CAPSULE | Freq: Every day | ORAL | Status: DC
  Administered 2015-09-06 – 2015-09-07 (×2): 10 mg via ORAL
  Filled 2015-09-06 (×3): qty 2

## 2015-09-06 MED ORDER — FLEET ENEMA 7-19 GM/118ML RE ENEM: 1.0000 | ENEMA | Freq: Once | RECTAL | Status: DC | PRN

## 2015-09-06 MED ORDER — FINASTERIDE 5 MG PO TABS
5.0000 mg | ORAL_TABLET | Freq: Every day | ORAL | Status: DC
  Administered 2015-09-07 – 2015-09-08 (×2): 5 mg via ORAL
  Filled 2015-09-06 (×2): qty 1

## 2015-09-06 MED ORDER — MEPERIDINE HCL 50 MG/ML IJ SOLN: 6.2500 mg | INTRAMUSCULAR | Status: DC | PRN

## 2015-09-06 MED ORDER — PROPOFOL 10 MG/ML IV BOLUS
INTRAVENOUS | Status: AC
Start: 2015-09-06 — End: 2015-09-06
  Filled 2015-09-06: qty 20

## 2015-09-06 MED ORDER — CHLORHEXIDINE GLUCONATE 4 % EX LIQD: 60.0000 mL | Freq: Once | CUTANEOUS | Status: DC

## 2015-09-06 MED ORDER — METOPROLOL TARTRATE 50 MG PO TABS
50.0000 mg | ORAL_TABLET | Freq: Every day | ORAL | Status: DC
  Filled 2015-09-06: qty 1

## 2015-09-06 MED ORDER — SODIUM CHLORIDE 0.9 % IV SOLN: INTRAVENOUS | Status: DC

## 2015-09-06 MED ORDER — ASPIRIN EC 325 MG PO TBEC
325.0000 mg | DELAYED_RELEASE_TABLET | Freq: Every day | ORAL | Status: DC
  Administered 2015-09-07 – 2015-09-08 (×2): 325 mg via ORAL
  Filled 2015-09-06 (×3): qty 1

## 2015-09-06 MED ORDER — CLOPIDOGREL BISULFATE 75 MG PO TABS
75.0000 mg | ORAL_TABLET | Freq: Every day | ORAL | Status: DC
  Administered 2015-09-07 – 2015-09-08 (×2): 75 mg via ORAL
  Filled 2015-09-06 (×2): qty 1

## 2015-09-06 MED ORDER — METOPROLOL TARTRATE 25 MG PO TABS: 25.0000 mg | ORAL_TABLET | Freq: Two times a day (BID) | ORAL | Status: DC

## 2015-09-06 MED ORDER — MORPHINE SULFATE (PF) 2 MG/ML IV SOLN: 1.0000 mg | INTRAVENOUS | Status: DC | PRN

## 2015-09-06 MED ORDER — IRBESARTAN 150 MG PO TABS
150.0000 mg | ORAL_TABLET | Freq: Every day | ORAL | Status: DC
  Filled 2015-09-06 (×2): qty 1

## 2015-09-06 MED ORDER — 0.9 % SODIUM CHLORIDE (POUR BTL) OPTIME
TOPICAL | Status: DC | PRN
  Administered 2015-09-06: 1000 mL

## 2015-09-06 MED ORDER — METOCLOPRAMIDE HCL 5 MG/ML IJ SOLN
5.0000 mg | Freq: Three times a day (TID) | INTRAMUSCULAR | Status: DC | PRN
Start: 2015-09-06 — End: 2015-09-08

## 2015-09-06 MED ORDER — LACTATED RINGERS IV SOLN
INTRAVENOUS | Status: DC | PRN
  Administered 2015-09-06 (×2): via INTRAVENOUS

## 2015-09-06 MED ORDER — BUPIVACAINE IN DEXTROSE 0.75-8.25 % IT SOLN
INTRATHECAL | Status: DC | PRN
  Administered 2015-09-06: 15 mg via INTRATHECAL

## 2015-09-06 MED ORDER — OXYCODONE HCL 5 MG PO TABS: 5.0000 mg | ORAL_TABLET | ORAL | Status: DC | PRN

## 2015-09-06 MED ORDER — TRANEXAMIC ACID 1000 MG/10ML IV SOLN
2000.0000 mg | INTRAVENOUS | Status: DC | PRN
  Administered 2015-09-06: 2000 mg via TOPICAL

## 2015-09-06 MED ORDER — POLYETHYLENE GLYCOL 3350 17 G PO PACK: 17.0000 g | PACK | Freq: Every day | ORAL | Status: DC | PRN

## 2015-09-06 MED ORDER — ACETAMINOPHEN 10 MG/ML IV SOLN
1000.0000 mg | Freq: Once | INTRAVENOUS | Status: AC
  Administered 2015-09-06: 1000 mg via INTRAVENOUS

## 2015-09-06 MED ORDER — CEFAZOLIN SODIUM-DEXTROSE 2-3 GM-% IV SOLR
2.0000 g | INTRAVENOUS | Status: AC
  Administered 2015-09-06: 2 g via INTRAVENOUS

## 2015-09-06 MED ORDER — TRANEXAMIC ACID 1000 MG/10ML IV SOLN
2000.0000 mg | Freq: Once | INTRAVENOUS | Status: DC
  Filled 2015-09-06: qty 20

## 2015-09-06 MED ORDER — LACTATED RINGERS IV SOLN
INTRAVENOUS | Status: DC
  Administered 2015-09-06: 1000 mL via INTRAVENOUS

## 2015-09-06 MED ORDER — ONDANSETRON HCL 4 MG PO TABS: 4.0000 mg | ORAL_TABLET | Freq: Four times a day (QID) | ORAL | Status: DC | PRN

## 2015-09-06 MED ORDER — PHENOL 1.4 % MT LIQD: 1.0000 | OROMUCOSAL | Status: DC | PRN

## 2015-09-06 MED ORDER — LIDOCAINE HCL (CARDIAC) 20 MG/ML IV SOLN
INTRAVENOUS | Status: DC | PRN
  Administered 2015-09-06: 50 mg via INTRAVENOUS

## 2015-09-06 SURGICAL SUPPLY — 68 items
BAG DECANTER FOR FLEXI CONT (MISCELLANEOUS) ×3 IMPLANT
BAG SPEC THK2 15X12 ZIP CLS (MISCELLANEOUS)
BAG ZIPLOCK 12X15 (MISCELLANEOUS) IMPLANT
BIT DRILL 2.8X128 (BIT) ×2 IMPLANT
BIT DRILL 2.8X128MM (BIT) ×1
BLADE EXTENDED COATED 6.5IN (ELECTRODE) ×3 IMPLANT
BLADE SAW SAG 73X25 THK (BLADE)
BLADE SAW SGTL 73X25 THK (BLADE) IMPLANT
BRUSH FEMORAL CANAL (MISCELLANEOUS) IMPLANT
CLOSURE WOUND 1/2 X4 (GAUZE/BANDAGES/DRESSINGS) ×2
DRAPE INCISE IOBAN 66X45 STRL (DRAPES) ×3 IMPLANT
DRAPE ORTHO SPLIT 77X108 STRL (DRAPES) ×6
DRAPE POUCH INSTRU U-SHP 10X18 (DRAPES) ×3 IMPLANT
DRAPE SURG ORHT 6 SPLT 77X108 (DRAPES) ×2 IMPLANT
DRAPE U-SHAPE 47X51 STRL (DRAPES) ×3 IMPLANT
DRSG ADAPTIC 3X8 NADH LF (GAUZE/BANDAGES/DRESSINGS) ×3 IMPLANT
DRSG EMULSION OIL 3X16 NADH (GAUZE/BANDAGES/DRESSINGS) ×3 IMPLANT
DRSG MEPILEX BORDER 4X4 (GAUZE/BANDAGES/DRESSINGS) ×3 IMPLANT
DRSG MEPILEX BORDER 4X8 (GAUZE/BANDAGES/DRESSINGS) ×3 IMPLANT
DURAPREP 26ML APPLICATOR (WOUND CARE) ×3 IMPLANT
ELECT REM PT RETURN 9FT ADLT (ELECTROSURGICAL) ×3
ELECTRODE REM PT RTRN 9FT ADLT (ELECTROSURGICAL) ×1 IMPLANT
EVACUATOR 1/8 PVC DRAIN (DRAIN) ×3 IMPLANT
FACESHIELD WRAPAROUND (MASK) ×12 IMPLANT
GAUZE SPONGE 4X4 12PLY STRL (GAUZE/BANDAGES/DRESSINGS) IMPLANT
GLOVE BIO SURGEON STRL SZ8 (GLOVE) ×3 IMPLANT
GLOVE BIOGEL PI IND STRL 7.5 (GLOVE) ×2 IMPLANT
GLOVE BIOGEL PI IND STRL 8 (GLOVE) ×2 IMPLANT
GLOVE BIOGEL PI INDICATOR 7.5 (GLOVE) ×4
GLOVE BIOGEL PI INDICATOR 8 (GLOVE) ×4
GLOVE ECLIPSE 8.0 STRL XLNG CF (GLOVE) ×3 IMPLANT
GLOVE SURG SS PI 7.5 STRL IVOR (GLOVE) ×6 IMPLANT
GOWN STRL REUS W/TWL LRG LVL3 (GOWN DISPOSABLE) ×6 IMPLANT
GOWN STRL REUS W/TWL XL LVL3 (GOWN DISPOSABLE) ×6 IMPLANT
HANDPIECE INTERPULSE COAX TIP (DISPOSABLE)
HEAD M SROM 36MM PLUS (Hips) ×1 IMPLANT
HEAD M SROM 36MM PLUS 3 (Hips) ×1 IMPLANT
IMMOBILIZER KNEE 20 (SOFTGOODS)
IMMOBILIZER KNEE 20 THIGH 36 (SOFTGOODS) IMPLANT
KIT BASIN OR (CUSTOM PROCEDURE TRAY) ×3 IMPLANT
MANIFOLD NEPTUNE II (INSTRUMENTS) ×3 IMPLANT
NDL SAFETY ECLIPSE 18X1.5 (NEEDLE) ×1 IMPLANT
NEEDLE HYPO 18GX1.5 SHARP (NEEDLE) ×3
PACK TOTAL JOINT (CUSTOM PROCEDURE TRAY) ×3 IMPLANT
PASSER SUT SWANSON 36MM LOOP (INSTRUMENTS) IMPLANT
PEN SKIN MARKING BROAD (MISCELLANEOUS) IMPLANT
PIN ESC CONSTR ACE LINER 36X56 ×3 IMPLANT
POSITIONER SURGICAL ARM (MISCELLANEOUS) ×3 IMPLANT
PRESSURIZER FEMORAL UNIV (MISCELLANEOUS) IMPLANT
SET HNDPC FAN SPRY TIP SCT (DISPOSABLE) IMPLANT
SPONGE LAP 18X18 X RAY DECT (DISPOSABLE) IMPLANT
SROM M HEAD 36MM PLUS (Hips) ×3 IMPLANT
SROM M HEAD 36MM PLUS 3 (Hips) ×3 IMPLANT
STAPLER VISISTAT 35W (STAPLE) IMPLANT
STRIP CLOSURE SKIN 1/2X4 (GAUZE/BANDAGES/DRESSINGS) ×4 IMPLANT
SUCTION FRAZIER TIP 10 FR DISP (SUCTIONS) IMPLANT
SUT ETHIBOND NAB CT1 #1 30IN (SUTURE) ×6 IMPLANT
SUT VIC AB 1 CT1 27 (SUTURE) ×3
SUT VIC AB 1 CT1 27XBRD ANTBC (SUTURE) ×1 IMPLANT
SUT VIC AB 2-0 CT1 27 (SUTURE) ×9
SUT VIC AB 2-0 CT1 TAPERPNT 27 (SUTURE) ×3 IMPLANT
SUT VLOC 180 0 24IN GS25 (SUTURE) ×3 IMPLANT
SYR 50ML LL SCALE MARK (SYRINGE) ×3 IMPLANT
TOWEL OR 17X26 10 PK STRL BLUE (TOWEL DISPOSABLE) ×6 IMPLANT
TOWER CARTRIDGE SMART MIX (DISPOSABLE) IMPLANT
TRAY FOLEY W/METER SILVER 16FR (SET/KITS/TRAYS/PACK) ×3 IMPLANT
WATER STERILE IRR 1500ML POUR (IV SOLUTION) ×3 IMPLANT
YANKAUER SUCT BULB TIP 10FT TU (MISCELLANEOUS) ×3 IMPLANT

## 2015-09-06 NOTE — Anesthesia Postprocedure Evaluation (Signed)
  Anesthesia Post-op Note  Patient: [name]  Procedure(s) Performed: Procedure(s) (LRB): RIGHT TOTAL HIP ACETABULAR LINER REVISION (CONSTRAINED LINER) (Right)  Patient Location: PACU  Anesthesia Type: Spinal  Level of Consciousness: awake and alert   Airway and Oxygen Therapy: Patient Spontanous Breathing  Post-op Pain: mild  Post-op Assessment: Post-op Vital signs reviewed, Patient's Cardiovascular Status Stable, Respiratory Function Stable, Patent Airway and No signs of Nausea or vomiting  Last Vitals:  Filed Vitals:   09/06/15 2117  BP:   Pulse: 49  Temp:   Resp: 16    Post-op Vital Signs: stable   Complications: No apparent anesthesia complications

## 2015-09-06 NOTE — Anesthesia Procedure Notes (Signed)
Spinal  Start time: 09/06/2015 1:38 PM End time: 09/06/2015 1:43 PM Staffing Resident/CRNA: Sherian Maroon A Performed by: resident/CRNA  Preanesthetic Checklist Completed: patient identified, site marked, surgical consent, pre-op evaluation, timeout performed, IV checked, risks and benefits discussed and monitors and equipment checked Spinal Block Patient position: sitting Prep: Betadine Patient monitoring: heart rate, cardiac monitor, continuous pulse ox and blood pressure Approach: right paramedian Location: L3-4 Injection technique: single-shot Needle Needle type: Spinocan  Needle gauge: 22 G Needle length: 9 cm Needle insertion depth: 5 cm Assessment Sensory level: T6

## 2015-09-06 NOTE — Progress Notes (Signed)
Set pt up on Cpap 5.5 Cm h20 pt brought his own tubing and nasal mask.  Bled in 2 liters of O2.  Pt tolerating well no issues to report.  RT will continue to monitor.

## 2015-09-06 NOTE — Discharge Instructions (Signed)
Dr. [name] Total Joint Specialist [facility] [address] [place], [place] [address] [phone]   POSTERIOR TOTAL HIP REPLACEMENT POSTOPERATIVE DIRECTIONS  Hip Rehabilitation, Guidelines Following Surgery  The results of a hip operation are greatly improved after range of motion and muscle strengthening exercises. Follow all safety measures which are given to protect your hip. If any of these exercises cause increased pain or swelling in your joint, decrease the amount until you are comfortable again. Then slowly increase the exercises. Call your caregiver if you have problems or questions.   HOME CARE INSTRUCTIONS  Remove items at home which could result in a fall. This includes throw rugs or furniture in walking pathways.   ICE to the affected hip every three hours for 30 minutes at a time and then as needed for pain and swelling.  Continue to use ice on the hip for pain and swelling from surgery. You may notice swelling that will progress down to the foot and ankle.  This is normal after surgery.  Elevate the leg when you are not up walking on it.    Continue to use the breathing machine which will help keep your temperature down.  It is common for your temperature to cycle up and down following surgery, especially at night when you are not up moving around and exerting yourself.  The breathing machine keeps your lungs expanded and your temperature down.  DIET You may resume your previous home diet once your are discharged from the hospital.  DRESSING / WOUND CARE / SHOWERING You may start showering once you are discharged home but do not submerge the incision under water. Just pat the incision dry and apply a dry gauze dressing on daily. Change the surgical dressing daily and reapply a dry dressing each time.    ACTIVITY Walk with your walker as instructed. Use walker as long as suggested by your caregivers. Avoid periods of inactivity such  as sitting longer than an hour when not asleep. This helps prevent blood clots.  You may resume a sexual relationship in one month or when given the OK by your doctor.  You may return to work once you are cleared by your doctor.  Do not drive a car for 6 weeks or until released by you surgeon.  Do not drive while taking narcotics.  WEIGHT BEARING Other:  Weight bearing as tolerated  POSTOPERATIVE CONSTIPATION PROTOCOL Constipation - defined medically as fewer than three stools per week and severe constipation as less than one stool per week.  One of the most common issues patients have following surgery is constipation.  Even if you have a regular bowel pattern at home, your normal regimen is likely to be disrupted due to multiple reasons following surgery.  Combination of anesthesia, postoperative narcotics, change in appetite and fluid intake all can affect your bowels.  In order to avoid complications following surgery, here are some recommendations in order to help you during your recovery period.  Colace (docusate) - Pick up an over-the-counter form of Colace or another stool softener and take twice a day as long as you are requiring postoperative pain medications.  Take with a full glass of water daily.  If you experience loose stools or diarrhea, hold the colace until you stool forms back up.  If your symptoms do not get better within 1 week or if they get worse, check with your doctor.  Dulcolax (bisacodyl) - Pick up over-the-counter and take as directed by  the product packaging as needed to assist with the movement of your bowels.  Take with a full glass of water.  Use this product as needed if not relieved by Colace only.   MiraLax (polyethylene glycol) - Pick up over-the-counter to have on hand.  MiraLax is a solution that will increase the amount of water in your bowels to assist with bowel movements.  Take as directed and can mix with a glass of water, juice, soda, coffee, or tea.   Take if you go more than two days without a movement. Do not use MiraLax more than once per day. Call your doctor if you are still constipated or irregular after using this medication for 7 days in a row.  If you continue to have problems with postoperative constipation, please contact the office for further assistance and recommendations.  If you experience "the worst abdominal pain ever" or develop nausea or vomiting, please contact the office immediatly for further recommendations for treatment.  ITCHING  If you experience itching with your medications, try taking only a single pain pill, or even half a pain pill at a time.  You can also use Benadryl over the counter for itching or also to help with sleep.   TED HOSE STOCKINGS Wear the elastic stockings on both legs for three weeks following surgery during the day but you may remove then at night for sleeping.  MEDICATIONS See your medication summary on the After Visit Summary that the nursing staff will review with you prior to discharge.  You may have some home medications which will be placed on hold until you complete the course of blood thinner medication.  It is important for you to complete the blood thinner medication as prescribed by your surgeon.  Continue your approved medications as instructed at time of discharge.  PRECAUTIONS If you experience chest pain or shortness of breath - call 911 immediately for transfer to the hospital emergency department.  If you develop a fever greater that 101 F, purulent drainage from wound, increased redness or drainage from wound, foul odor from the wound/dressing, or calf pain - CONTACT YOUR SURGEON.                                                   FOLLOW-UP APPOINTMENTS Make sure you keep all of your appointments after your operation with your surgeon and caregivers. You should call the office at the above phone number and make an appointment for approximately two weeks after the date of your  surgery or on the date instructed by your surgeon outlined in the "After Visit Summary".  RANGE OF MOTION AND STRENGTHENING EXERCISES  These exercises are designed to help you keep full movement of your hip joint. Follow your caregiver's or physical therapist's instructions. Perform all exercises about fifteen times, three times per day or as directed. Exercise both hips, even if you have had only one joint replacement. These exercises can be done on a training (exercise) mat, on the floor, on a table or on a bed. Use whatever works the best and is most comfortable for you. Use music or television while you are exercising so that the exercises are a pleasant break in your day. This will make your life better with the exercises acting as a break in routine you can look forward to.  Lying on  your back, slowly slide your foot toward your buttocks, raising your knee up off the floor. Then slowly slide your foot back down until your leg is straight again.  Lying on your back spread your legs as far apart as you can without causing discomfort.  Lying on your side, raise your upper leg and foot straight up from the floor as far as is comfortable. Slowly lower the leg and repeat.  Lying on your back, tighten up the muscle in the front of your thigh (quadriceps muscles). You can do this by keeping your leg straight and trying to raise your heel off the floor. This helps strengthen the largest muscle supporting your knee.  Lying on your back, tighten up the muscles of your buttocks both with the legs straight and with the knee bent at a comfortable angle while keeping your heel on the floor.      IF YOU ARE TRANSFERRED TO A SKILLED REHAB FACILITY If the patient is transferred to a skilled rehab facility following release from the hospital, a list of the current medications will be sent to the facility for the patient to continue.  When discharged from the skilled rehab facility, please have the facility set up  the patient's [place] prior to being released. Also, the skilled facility will be responsible for providing the patient with their medications at time of release from the facility to include their pain medication, the muscle relaxants, and their blood thinner medication. If the patient is still at the rehab facility at time of the two week follow up appointment, the skilled rehab facility will also need to assist the patient in arranging follow up appointment in our office and any transportation needs.  MAKE SURE YOU:  Understand these instructions.  Get help right away if you are not doing well or get worse.    Pick up stool softner and laxative for home use following surgery while on pain medications. Do not submerge incision under water. Please use good hand washing techniques while changing dressing each day. May shower starting three days after surgery. Please use a clean towel to pat the incision dry following showers. Continue to use ice for pain and swelling after surgery. Do not use any lotions or creams on the incision until instructed by your surgeon.

## 2015-09-06 NOTE — Plan of Care (Signed)
Problem: Consults Goal: Diagnosis- Total Joint Replacement Outcome: Completed/Met Date Met:  09/06/15 Revision Total Hip RIGHT, posterior

## 2015-09-06 NOTE — Op Note (Signed)
NAMEOSKER, [name]  MEDICAL RECORD NO.:  [id number]  LOCATION:  28                         FACILITY:  [facility]  PHYSICIAN:  [name], M.D.    DATE OF BIRTH:  September 01, 1925  DATE OF PROCEDURE:  09/06/2015 DATE OF DISCHARGE:                              OPERATIVE REPORT   PREOPERATIVE DIAGNOSIS:  Recurrent right hip dislocation.  POSTOPERATIVE DIAGNOSIS:  Recurrent right hip dislocation.  PROCEDURE:  Right total hip acetabular liner revision to a constrained liner.  SURGEON:  [name], MD  ASSISTANT:  Judith Part. Chabon, PA-C  ANESTHESIA:  Spinal.  ESTIMATED BLOOD LOSS:  150.  DRAINS:  Hemovac x1.  COMPLICATIONS:  None.  CONDITION:  Stable to recovery.  BRIEF CLINICAL NOTE:  [name] is an 81 year old male, who had a total hip arthroplasty done many years ago.  He has had at least 4 dislocations.  We had discussed constrained liner in the past that is staged now where he is ready to go ahead and get this done to prevent further dislocations.  PROCEDURE IN DETAIL:  After successful administration of spinal anesthetic, the patient was placed in left lateral decubitus position with the right side up and held with the hip positioner.  Right lower extremity was isolated from his perineum with plastic drapes and prepped and draped in usual sterile fashion.  Previous short posterolateral incision was made with a 10 blade through subcutaneous tissue to the level of the fascia lata which was incised in line with the skin incision.  Sciatic nerve was palpated and protected.  The short rotators and posterior capsule had already been off the femur from his previous dislocations.  We subsequently dislocated the hip and he required 90 degrees of flexion about 40 degrees of abduction and about 70 degrees of internal rotation in order to dislocate it.  It was not grossly unstable.  Hip was dislocated and the femoral head was removed.   The version was about 20 degrees of anteversion which was normal.  The stem was well fixed.  I translated the femur anteriorly and placed retractors to gain acetabular exposure.  Soft tissue removed from the edge of the acetabular component.  I then used the extraction device to remove the acetabular liner from the acetabular shell.  There was a 56 mm Pinnacle acetabular shell.  Once we cleared all tissue out from the edges, checked the position of the cup and it looks anatomically positioned. It is well fixed.  We placed the 56 mm constrained liner for the 36 head.  I placed a 36+ 0 head onto the femoral neck and impacted it.  We then reduced the hip into the locking socket.  Then placed the locking ring around the lip of the liner and impacted that.  I placed the hip through a range of motion with about 100 degrees of flexion and 90 degrees flexion, 40 degrees of abduction, 90 degrees internal rotation, 70 degrees flexion, 40 degrees adduction, 90 degrees internal rotation, and full extension, full external rotation.  There was no impingement on any of these ranges of motion.  Leg lengths were equal by placing the right leg on top  of the left.  Wound was copiously irrigated with saline solution and then the short rotators and capsule reattached to the femur through drill holes with Ethibond suture.  30 mL of 0.25% Marcaine was injected into the subcu tissues, fascia lata, and gluteal muscles. Fascia lata was closed in a running #1 V-Loc over a Hemovac drain.  The 2 g of tranexamic acid mixed with 50 mL of saline, then injected into the joint.  Subcu was then closed with interrupted 2-0 Vicryl, and subcuticular closed with running 4-0 Monocryl.  The drains were hooked to suction.  Incision cleaned and dried and Steri-Strips and a bulky sterile dressing applied.  He was then awakened and transported to recovery in stable condition.  Note that a surgical assistant is a medical necessity  for this procedure to do it in a safe and expeditious manner.  Surgical assistant is necessary for retraction of vital neurovascular structures and for proper positioning of the limb in order to remove the old implant and placed a new one.     [name], M.D.     FA/MEDQ  D:  09/06/2015  T:  09/06/2015  Job:  [id number]

## 2015-09-06 NOTE — Transfer of Care (Signed)
Immediate Anesthesia Transfer of Care Note  Patient: [name]  Procedure(s) Performed: Procedure(s): RIGHT TOTAL HIP ACETABULAR LINER REVISION (CONSTRAINED LINER) (Right)  Patient Location: PACU  Anesthesia Type:MAC and Spinal  Level of Consciousness: awake, alert , oriented and patient cooperative  Airway & Oxygen Therapy: Patient Spontanous Breathing and Patient connected to face mask oxygen  Post-op Assessment: Report given to RN, Post -op Vital signs reviewed and stable and Patient moving all extremities  Post vital signs: Reviewed and stable  Last Vitals:  Filed Vitals:   09/06/15 1042  BP: 143/69  Pulse: 48  Temp: 36.4 C  Resp: 18    Complications: No apparent anesthesia complications

## 2015-09-06 NOTE — Interval H&P Note (Signed)
History and Physical Interval Note:  09/06/2015 1:21 PM  [name]  has presented today for surgery, with the diagnosis of RECURRENT RIGHT HIP DISLOCATION  The various methods of treatment have been discussed with the patient and family. After consideration of risks, benefits and other options for treatment, the patient has consented to  Procedure(s): RIGHT HIP ACETABULAR LINER REVISION (CONSTRAINED LINER) (Right) as a surgical intervention .  The patient's history has been reviewed, patient examined, no change in status, stable for surgery.  I have reviewed the patient's chart and labs.  Questions were answered to the patient's satisfaction.     [name]

## 2015-09-06 NOTE — Progress Notes (Signed)
PT caregiver states that PT has own CPAP unit , circuit and mask. RT has notified [employer] that home equipment needs to be inspected.

## 2015-09-06 NOTE — Plan of Care (Signed)
Problem: Consults Goal: Diagnosis- Total Joint Replacement Revision Total Hip     

## 2015-09-06 NOTE — Brief Op Note (Signed)
09/06/2015  2:52 PM  PATIENT:  [name]  81 y.o. male  PRE-OPERATIVE DIAGNOSIS:  RECURRENT RIGHT HIP DISLOCATION  POST-OPERATIVE DIAGNOSIS:  RECURRENT RIGHT HIP DISLOCATION  PROCEDURE:  Procedure(s): RIGHT TOTAL HIP ACETABULAR LINER REVISION (CONSTRAINED LINER) (Right)  SURGEON:  Surgeon(s) and Role:    * [name], MD - Primary  PHYSICIAN ASSISTANT:   ASSISTANTS: Molli Barrows, PA-C   ANESTHESIA:   spinal  EBL:  Total I/O In: 1000 [I.V.:1000] Out: 165 [Urine:15; Blood:150]  BLOOD ADMINISTERED:none  DRAINS: (Medium) Hemovact drain(s) in the right hip with  Suction Open   LOCAL MEDICATIONS USED:  MARCAINE     COUNTS:  YES  TOURNIQUET:  * No tourniquets in log *  DICTATION: .Other Dictation: Dictation Number [id number]  PLAN OF CARE: Admit to inpatient   PATIENT DISPOSITION:  PACU - hemodynamically stable.

## 2015-09-06 NOTE — Progress Notes (Signed)
Utilization review completed.  

## 2015-09-06 NOTE — Anesthesia Preprocedure Evaluation (Signed)
Anesthesia Evaluation  Patient identified by MRN, date of birth, ID band Patient awake    Reviewed: Allergy & Precautions, H&P , NPO status , Patient's Chart, lab work & pertinent test results, reviewed documented beta blocker date and time   Airway Mallampati: II  TM Distance: >3 FB Neck ROM: Full    Dental  (+) Teeth Intact, Dental Advisory Given   Pulmonary neg pulmonary ROS, sleep apnea and Continuous Positive Airway Pressure Ventilation ,    breath sounds clear to auscultation       Cardiovascular hypertension, Pt. on medications + CAD and + Past MI  + dysrhythmias Supra Ventricular Tachycardia  Rhythm:Regular Rate:Normal  Cad, s/p PTca w/ stent 2002 Currently asymptomatic   Neuro/Psych Some memory deficits negative psych ROS   GI/Hepatic negative GI ROS, Neg liver ROS, neg GERD  ,  Endo/Other  negative endocrine ROS  Renal/GU Cr 1.59  negative genitourinary   Musculoskeletal negative musculoskeletal ROS (+)   Abdominal   Peds negative pediatric ROS (+)  Hematology negative hematology ROS (+)   Anesthesia Other Findings   Reproductive/Obstetrics negative OB ROS                             Anesthesia Physical  Anesthesia Plan  ASA: III  Anesthesia Plan: Spinal   Post-op Pain Management:    Induction:   Airway Management Planned: Simple Face Mask  Additional Equipment:   Intra-op Plan:   Post-operative Plan:   Informed Consent: I have reviewed the patients History and Physical, chart, labs and discussed the procedure including the risks, benefits and alternatives for the proposed anesthesia with the patient or authorized representative who has indicated his/her understanding and acceptance.     Plan Discussed with: CRNA and Surgeon  Anesthesia Plan Comments:         Anesthesia Quick Evaluation

## 2015-09-07 ENCOUNTER — Encounter (HOSPITAL_COMMUNITY): Payer: Self-pay | Admitting: Orthopedic Surgery

## 2015-09-07 LAB — BASIC METABOLIC PANEL
Anion gap: 6 (ref 5–15)
BUN: 25 mg/dL — AB (ref 6–20)
CHLORIDE: 106 mmol/L (ref 101–111)
CO2: 26 mmol/L (ref 22–32)
Calcium: 8.5 mg/dL — ABNORMAL LOW (ref 8.9–10.3)
Creatinine, Ser: 1.46 mg/dL — ABNORMAL HIGH (ref 0.61–1.24)
GFR calc Af Amer: 48 mL/min — ABNORMAL LOW (ref >60)
GFR, EST NON AFRICAN AMERICAN: 41 mL/min — AB (ref >60)
GLUCOSE: 121 mg/dL — AB (ref 65–99)
POTASSIUM: 4.3 mmol/L (ref 3.5–5.1)
Sodium: 138 mmol/L (ref 135–145)

## 2015-09-07 LAB — CBC
HEMATOCRIT: 31.7 % — AB (ref 39.0–52.0)
Hemoglobin: 10.3 g/dL — ABNORMAL LOW (ref 13.0–17.0)
MCH: 30.3 pg (ref 26.0–34.0)
MCHC: 32.5 g/dL (ref 30.0–36.0)
MCV: 93.2 fL (ref 78.0–100.0)
PLATELETS: 129 10*3/uL — AB (ref 150–400)
RBC: 3.4 MIL/uL — AB (ref 4.22–5.81)
RDW: 13.1 % (ref 11.5–15.5)
WBC: 7.9 10*3/uL (ref 4.0–10.5)

## 2015-09-07 MED ORDER — ASPIRIN 325 MG PO TBEC: 325.0000 mg | DELAYED_RELEASE_TABLET | Freq: Every day | ORAL | Status: DC

## 2015-09-07 MED ORDER — METHOCARBAMOL 500 MG PO TABS: 500.0000 mg | ORAL_TABLET | Freq: Four times a day (QID) | ORAL | Status: DC | PRN

## 2015-09-07 MED ORDER — TRAMADOL HCL 50 MG PO TABS: 50.0000 mg | ORAL_TABLET | Freq: Four times a day (QID) | ORAL | Status: DC | PRN

## 2015-09-07 MED ORDER — SODIUM CHLORIDE 0.9 % IV BOLUS (SEPSIS)
250.0000 mL | Freq: Once | INTRAVENOUS | Status: AC
  Administered 2015-09-07: 250 mL via INTRAVENOUS

## 2015-09-07 MED ORDER — METOPROLOL TARTRATE 50 MG PO TABS
50.0000 mg | ORAL_TABLET | Freq: Every day | ORAL | Status: DC
  Filled 2015-09-07: qty 1

## 2015-09-07 MED ORDER — OXYCODONE HCL 5 MG PO TABS: 5.0000 mg | ORAL_TABLET | ORAL | Status: DC | PRN

## 2015-09-07 MED ORDER — METOPROLOL TARTRATE 25 MG PO TABS
25.0000 mg | ORAL_TABLET | Freq: Every day | ORAL | Status: DC
  Filled 2015-09-07 (×2): qty 1

## 2015-09-07 NOTE — Care Management Note (Signed)
Case Management Note  Patient Details  Name: [name] MRN: [id number] Date of Birth: 12/25/1924  Subjective/Objective:                  RIGHT TOTAL HIP ACETABULAR LINER REVISION (CONSTRAINED LINER) (Right)  Action/Plan:  Discharge planning Expected Discharge Date:  09/08/15               Expected Discharge Plan:  [place]  In-House Referral:     Discharge planning Services  CM Consult  Post Acute Care Choice:  Home Health Choice offered to:  Patient  DME Arranged:  Walker rolling DME Agency:  [place] Arranged:  PT [facility] Agency:  [place]  Status of Service:  Completed, signed off  Medicare Important Message Given:    Date Medicare IM Given:    Medicare IM give by:    Date Additional Medicare IM Given:    Additional Medicare Important Message give by:     If discussed at [place] of Stay Meetings, dates discussed:    Additional Comments: CM met with pt and spouse in room to offer choice of home health agency.  Pt chooses [facility] to render HHPT.  Pt states he does not need a 3n1 but needs a rolling walker.  Address and contact information verified by pt.  Referral given to [facility] rep, Tim (on unit).  CM called [facility] DME rep, Colletta [place] to please deliver a rolling walker to room prior to discharge.  No other CM  Needs were communicated. Dellie Catholic, RN 09/07/2015, 12:49 PM

## 2015-09-07 NOTE — Evaluation (Signed)
Physical Therapy Evaluation Patient Details Name: [name] MRN: [id number] DOB: 03-26-1925 Today's Date: 09/07/2015   History of Present Illness  Pt s/p R THR revision to constrained liner 2* repeated dislocations  Clinical Impression  Pt s/p R THR revision presents with decreased R LE strength/ROM, post op discomfort and posterior THP limiting functional mobility.  Pt should progress to dc home with family assist and HHPT follow up.    Follow Up Recommendations Home health PT    Equipment Recommendations  Rolling walker with 5" wheels    Recommendations for Other Services OT consult     Precautions / Restrictions Precautions Precautions: Fall;Posterior Hip Precaution Booklet Issued: Yes (comment) Precaution Comments: THP reviewed x 2 and sign hung in room Restrictions Weight Bearing Restrictions: No Other Position/Activity Restrictions: WBAT      Mobility  Bed Mobility Overal bed mobility: Needs Assistance Bed Mobility: Supine to Sit     Supine to sit: Min guard     General bed mobility comments: cues for sequence and use of L LE to self assist  Transfers Overall transfer level: Needs assistance Equipment used: Rolling walker (2 wheeled) Transfers: Sit to/from Stand Sit to Stand: Min guard         General transfer comment: cues for LE management, adherence to THP and use of UEs to self assist  Ambulation/Gait Ambulation/Gait assistance: Min assist;Min guard Ambulation Distance (Feet): 200 Feet Assistive device: Rolling walker (2 wheeled) Gait Pattern/deviations: Step-to pattern;Step-through pattern;Decreased step length - right;Decreased step length - left;Shuffle;Trunk flexed Gait velocity: mod pace   General Gait Details: cues for posture, position from RW and initial sequence  Stairs            Wheelchair Mobility    Modified Rankin (Stroke Patients Only)       Balance                                              Pertinent Vitals/Pain Pain Assessment: 0-10 Pain Score: 2  Pain Location: R hip Pain Descriptors / Indicators: Aching Pain Intervention(s): Limited activity within patient's tolerance;Monitored during session;Ice applied    Home Living Family/patient expects to be discharged to:: Private residence Living Arrangements: Spouse/significant other Available Help at Discharge: Family Type of Home: House Home Access: Stairs to enter Entrance Stairs-Rails: Right Entrance Stairs-Number of Steps: 4 Home Layout: Able to live on main level with bedroom/bathroom Home Equipment: [place] - single point;Crutches      Prior Function Level of Independence: Independent               Hand Dominance        Extremity/Trunk Assessment   Upper Extremity Assessment: Overall WFL for tasks assessed           Lower Extremity Assessment: RLE deficits/detail RLE Deficits / Details: 3-/5 strength at hip with AAROM at hip to 90 flex and 20 abd    Cervical / Trunk Assessment: Normal  Communication   Communication: No difficulties  Cognition Arousal/Alertness: Awake/alert Behavior During Therapy: WFL for tasks assessed/performed Overall Cognitive Status: Within Functional Limits for tasks assessed                      General Comments      Exercises Total Joint Exercises Ankle Circles/Pumps: AROM;Both;15 reps;Supine Quad Sets: AROM;Both;10 reps;Supine Heel Slides: AAROM;Right;Supine;20 reps Hip ABduction/ADduction: AAROM;Right;15  reps;Supine      Assessment/Plan    PT Assessment Patient needs continued PT services  PT Diagnosis Difficulty walking   PT Problem List Decreased strength;Decreased range of motion;Decreased activity tolerance;Decreased mobility;Decreased knowledge of use of DME;Pain;Decreased safety awareness  PT Treatment Interventions DME instruction;Gait training;Stair training;Functional mobility training;Therapeutic activities;Therapeutic  exercise;Patient/family education   PT Goals (Current goals can be found in the Care Plan section) Acute Rehab PT Goals Patient Stated Goal: Resume previous active lifestyle with no more hip dislocations PT Goal Formulation: With patient Time For Goal Achievement: 09/11/15 Potential to Achieve Goals: Good    Frequency 7X/week   Barriers to discharge        Co-evaluation               End of Session Equipment Utilized During Treatment: Gait belt Activity Tolerance: Patient tolerated treatment well Patient left: in chair;with call bell/phone within reach;with family/visitor present Nurse Communication: Mobility status         Time: 4734-0370 PT Time Calculation (min) (ACUTE ONLY): 23 min   Charges:   PT Evaluation $Initial PT Evaluation Tier I: 1 Procedure PT Treatments $Therapeutic Exercise: 8-22 mins   PT G Codes:        BRADSHAW,HUNTER September 14, 2015, 1:56 PM

## 2015-09-07 NOTE — Progress Notes (Signed)
Pts blood pressure at 0950 was 89/39 using the dinamap with a HR of 47. Manually it was 95/40, with a radial pulse of 46. [place], [place], and notified her of the blood pressures and asked whether or not we should hold the BP medications. Parameters given to hold metoprolol if the BP was below 130/90 and/or the HR was below 80. Order modified to notate these changes. Orders also give to administer a 250cc bolus. Will continue to monitor pt.

## 2015-09-07 NOTE — Progress Notes (Signed)
Occupational Therapy Evaluation Patient Details Name: [name] MRN: [id number] DOB: [birth year] Today's Date: 09/07/2015    History of Present Illness Pt s/p R THR revision to constrained liner 2* repeated dislocations   Clinical Impression   Pt admitted with the above diagnoses and presents with below problem list. Pt will benefit from continued acute OT to address the below listed deficits and maximize independence with BADLs prior to d/c home with family. PTA pt was independent with ADLs. Pt is currently min guard to min A with LB ADLs and toilet/shower transfers. Session details below. OT to continue to follow acutely.      Follow Up Recommendations  Supervision/Assistance - 24 hour;Home health OT    Equipment Recommendations  None recommended by OT    Recommendations for Other Services       Precautions / Restrictions Precautions Precautions: Fall;Posterior Hip Precaution Booklet Issued: Yes (comment) Precaution Comments: THP reviewed x 2 and sign hung in room Restrictions Weight Bearing Restrictions: No Other Position/Activity Restrictions: WBAT      Mobility Bed Mobility Overal bed mobility: Needs Assistance Bed Mobility: Supine to Sit;Sit to Supine     Supine to sit: Min guard;HOB elevated Sit to supine: Min guard   General bed mobility comments: HOB elevated, cues for technique  Transfers Overall transfer level: Needs assistance Equipment used: Rolling walker (2 wheeled) Transfers: Sit to/from Stand Sit to Stand: Min guard         General transfer comment: from EOB and 3n1; cues for hand placement and technique to maintain posterior hip precautions    Balance Overall balance assessment: Needs assistance         Standing balance support: Bilateral upper extremity supported;During functional activity Standing balance-Leahy Scale: Poor Standing balance comment: external support for balance                            ADL  Overall ADL's : Needs assistance/impaired Eating/Feeding: Set up;Sitting   Grooming: Min guard;Standing   Upper Body Bathing: Set up;Sitting   Lower Body Bathing: Min guard;With adaptive equipment;Cueing for compensatory techniques;Sit to/from stand   Upper Body Dressing : Set up;Sitting   Lower Body Dressing: Min guard;With adaptive equipment;Cueing for compensatory techniques;Sit to/from stand   Toilet Transfer: Min guard;Ambulation;RW;BSC Toilet Transfer Details (indicate cue type and reason): 3n1 over toilet Toileting- Clothing Manipulation and Hygiene: Min guard;Sitting/lateral lean   Tub/ Shower Transfer: Minimal assistance;Tub transfer;Tub bench;Rolling walker   Functional mobility during ADLs: Min guard;Rolling walker General ADL Comments: Pt completed bed mobility, functional mobility (in-room) and toilet transfer as detailed above and below. Upon returning to bedside pt noted to initiate sitting down and laying down despite therapist instructing just prior to reaching bedside to wait before sitting. Also did not wait to initiate bed mobility despite cues prior to returning to supine (impulsivitiy vs. HOH?). Educated pt and spouse on ADL education including toilet and shower transfer techniques, AE, and compensatory strategies.      Vision     Perception     Praxis      Pertinent Vitals/Pain Pain Assessment: 0-10 Pain Score: 1  Pain Location: r hip Pain Descriptors / Indicators: Aching Pain Intervention(s): Limited activity within patient's tolerance;Monitored during session;Repositioned     Hand Dominance     Extremity/Trunk Assessment Upper Extremity Assessment Upper Extremity Assessment: Overall WFL for tasks assessed   Lower Extremity Assessment Lower Extremity Assessment: Defer to PT evaluation    Cervical /  Trunk Assessment Cervical / Trunk Assessment: Normal   Communication Communication Communication: HOH   Cognition Arousal/Alertness:  Awake/alert Behavior During Therapy: WFL for tasks assessed/performed;Impulsive (impulsive vs. HOH?) Overall Cognitive Status: Within Functional Limits for tasks assessed                     General Comments          Shoulder Instructions      Home Living Family/patient expects to be discharged to:: Private residence Living Arrangements: Spouse/significant other Available Help at Discharge: Family Type of Home: House Home Access: Stairs to enter Technical brewer of Steps: 4 Entrance Stairs-Rails: Right Home Layout: Able to live on main level with bedroom/bathroom     Bathroom Shower/Tub: Teacher, early years/pre: Standard     Home Equipment: Cane - single point;Crutches;Bedside commode;Tub bench;Toilet riser;Adaptive equipment Adaptive Equipment: Reacher;Long-handled sponge        Prior Functioning/Environment Level of Independence: Independent             OT Diagnosis: Acute pain   OT Problem List: Impaired balance (sitting and/or standing);Decreased safety awareness;Decreased knowledge of use of DME or AE;Decreased knowledge of precautions;Pain   OT Treatment/Interventions: Self-care/ADL training;DME and/or AE instruction;Therapeutic activities;Patient/family education;Balance training    OT Goals(Current goals can be found in the care plan section) Acute Rehab OT Goals Patient Stated Goal: Resume previous active lifestyle with no more hip dislocations OT Goal Formulation: With patient/family Time For Goal Achievement: 09/14/15 Potential to Achieve Goals: Good ADL Goals Pt Will Perform Lower Body Dressing: with supervision;with adaptive equipment;sit to/from stand Pt Will Perform Tub/Shower Transfer: Tub transfer;with supervision;ambulating;tub bench;rolling walker  OT Frequency: Min 2X/week   Barriers to D/C:            Co-evaluation              End of Session Equipment Utilized During Treatment: Gait belt;Rolling  walker  Activity Tolerance: Patient tolerated treatment well Patient left: in bed;with call bell/phone within reach;with family/visitor present   Time: 1407-1430 OT Time Calculation (min): 23 min Charges:  OT General Charges $OT Visit: 1 Procedure OT Evaluation $Initial OT Evaluation Tier I: 1 Procedure OT Treatments $Self Care/Home Management : 8-22 mins G-Codes:    Hortencia Pilar 10-02-15, 2:46 PM

## 2015-09-07 NOTE — Progress Notes (Signed)
   Subjective: 1 Day Post-Op Procedure(s) (LRB): RIGHT TOTAL HIP ACETABULAR LINER REVISION (CONSTRAINED LINER) (Right) Patient reports pain as mild.   We will start therapy today.  Plan is to go Home after hospital stay.  Objective: Vital signs in last 24 hours: Temp:  [97 F (36.1 C)-98.4 F (36.9 C)] 97 F (36.1 C) (09/22 0546) Pulse Rate:  [29-49] 42 (09/22 0546) Resp:  [12-18] 16 (09/22 0546) BP: (120-169)/(52-71) 125/66 mmHg (09/22 0546) SpO2:  [95 %-100 %] 98 % (09/22 0546) Weight:  [73.936 kg (163 lb)] 73.936 kg (163 lb) (09/21 1117)  Intake/Output from previous day:  Intake/Output Summary (Last 24 hours) at 09/07/15 0746 Last data filed at 09/07/15 0547  Gross per 24 hour  Intake   3720 ml  Output   1130 ml  Net   2590 ml    Intake/Output this shift:    Labs:  Recent Labs  09/07/15 0518  HGB 10.3*    Recent Labs  09/07/15 0518  WBC 7.9  RBC 3.40*  HCT 31.7*  PLT 129*    Recent Labs  09/07/15 0518  NA 138  K 4.3  CL 106  CO2 26  BUN 25*  CREATININE 1.46*  GLUCOSE 121*  CALCIUM 8.5*   No results for input(s): LABPT, INR in the last 72 hours.  EXAM General - Patient is Alert, Appropriate and Oriented Extremity - Neurologically intact Neurovascular intact Compartment soft Dressing - dressing C/D/I Motor Function - intact, moving foot and toes well on exam.  Hemovac pulled without difficulty.  Past Medical History  Diagnosis Date  . Coronary artery disease   . Left hip pain     Chronic left hip discomfort  . 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 Tachycardia     History of paroxysmal supraventricular tachycardia  . Hypercholesterolemia   . History of BPH   . Osteoarthritis   . Hypertension   . Diverticulosis   . SVT (supraventricular tachycardia)   . Tubular adenoma   . BPH (benign prostatic hyperplasia)   . IBS (irritable bowel syndrome)   . Kidney stone   . Family history of  adverse reaction to anesthesia     SISTER DIED AGE 57 DURING TONSILLECTOMY 50 YRS AGO UNKNOWN CAUSE   . Myocardial infarction 2001    Previous anterolateral myocardial infarction with presistent ST-T wave changes  . GERD (gastroesophageal reflux disease)   . Sleep apnea     uses cpap  . Chronic sinus bradycardia     Assessment/Plan: 1 Day Post-Op Procedure(s) (LRB): RIGHT TOTAL HIP ACETABULAR LINER REVISION (CONSTRAINED LINER) (Right) Principal Problem:   Mechanical instability of hip prosthesis   Advance diet Up with therapy D/C IV fluids Plan for discharge tomorrow  DVT Prophylaxis - Aspirin and Plavix Weight Bearing As Tolerated right Leg Hemovac Pulled Begin Therapy   [name]

## 2015-09-08 LAB — CBC
HCT: 31.8 % — ABNORMAL LOW (ref 39.0–52.0)
HEMOGLOBIN: 10.6 g/dL — AB (ref 13.0–17.0)
MCH: 30.9 pg (ref 26.0–34.0)
MCHC: 33.3 g/dL (ref 30.0–36.0)
MCV: 92.7 fL (ref 78.0–100.0)
Platelets: 133 10*3/uL — ABNORMAL LOW (ref 150–400)
RBC: 3.43 MIL/uL — AB (ref 4.22–5.81)
RDW: 13.4 % (ref 11.5–15.5)
WBC: 8.9 10*3/uL (ref 4.0–10.5)

## 2015-09-08 LAB — BASIC METABOLIC PANEL
Anion gap: 8 (ref 5–15)
BUN: 28 mg/dL — AB (ref 6–20)
CHLORIDE: 109 mmol/L (ref 101–111)
CO2: 24 mmol/L (ref 22–32)
Calcium: 8.5 mg/dL — ABNORMAL LOW (ref 8.9–10.3)
Creatinine, Ser: 1.63 mg/dL — ABNORMAL HIGH (ref 0.61–1.24)
GFR calc Af Amer: 42 mL/min — ABNORMAL LOW (ref >60)
GFR calc non Af Amer: 36 mL/min — ABNORMAL LOW (ref >60)
GLUCOSE: 107 mg/dL — AB (ref 65–99)
POTASSIUM: 4.2 mmol/L (ref 3.5–5.1)
Sodium: 141 mmol/L (ref 135–145)

## 2015-09-08 NOTE — Progress Notes (Signed)
   Subjective: 2 Days Post-Op Procedure(s) (LRB): RIGHT TOTAL HIP ACETABULAR LINER REVISION (CONSTRAINED LINER) (Right) Patient reports pain as mild.   Patient seen in rounds with Dr. Wynelle Link. Patient is well, and has had no acute complaints or problems. NO SOB or chest pain. No issues overnight.  Plan is to go Home after hospital stay.  Objective: Vital signs in last 24 hours: Temp:  [96.8 F (36 C)-99.1 F (37.3 C)] 98.8 F (37.1 C) (09/23 0537) Pulse Rate:  [45-64] 64 (09/23 0537) Resp:  [16] 16 (09/23 0537) BP: (89-135)/(39-57) 135/57 mmHg (09/23 0537) SpO2:  [91 %-98 %] 91 % (09/23 0537)  Intake/Output from previous day:  Intake/Output Summary (Last 24 hours) at 09/08/15 0749 Last data filed at 09/08/15 0538  Gross per 24 hour  Intake 1292.17 ml  Output   1150 ml  Net 142.17 ml     Labs:  Recent Labs  09/07/15 0518 09/08/15 0532  HGB 10.3* 10.6*    Recent Labs  09/07/15 0518 09/08/15 0532  WBC 7.9 8.9  RBC 3.40* 3.43*  HCT 31.7* 31.8*  PLT 129* 133*    Recent Labs  09/07/15 0518 09/08/15 0532  NA 138 141  K 4.3 4.2  CL 106 109  CO2 26 24  BUN 25* 28*  CREATININE 1.46* 1.63*  GLUCOSE 121* 107*  CALCIUM 8.5* 8.5*    EXAM General - Patient is Alert and Oriented Extremity - Neurologically intact Dorsiflexion/Plantar flexion intact Compartment soft Dressing/Incision - clean, dry, no drainage Motor Function - intact, moving foot and toes well on exam.   Past Medical History  Diagnosis Date  . Coronary artery disease   . Left hip pain     Chronic left hip discomfort  . 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 Tachycardia     History of paroxysmal supraventricular tachycardia  . Hypercholesterolemia   . History of BPH   . Osteoarthritis   . Hypertension   . Diverticulosis   . SVT (supraventricular tachycardia)   . Tubular adenoma   . BPH (benign prostatic hyperplasia)   . IBS (irritable bowel  syndrome)   . Kidney stone   . Family history of adverse reaction to anesthesia     SISTER DIED AGE 52 DURING TONSILLECTOMY 50 YRS AGO UNKNOWN CAUSE   . Myocardial infarction 2001    Previous anterolateral myocardial infarction with presistent ST-T wave changes  . GERD (gastroesophageal reflux disease)   . Sleep apnea     uses cpap  . Chronic sinus bradycardia     Assessment/Plan: 2 Days Post-Op Procedure(s) (LRB): RIGHT TOTAL HIP ACETABULAR LINER REVISION (CONSTRAINED LINER) (Right) Principal Problem:   Mechanical instability of hip prosthesis  Estimated body mass index is 26.32 kg/(m^2) as calculated from the following:   Height as of this encounter: 5\' 6"  (1.676 m).   Weight as of this encounter: 73.936 kg (163 lb). Advance diet Up with therapy D/C IV fluids Discharge home with home health  DVT Prophylaxis - Plavix Weight Bearing As [place], PA-C Orthopaedic Surgery 09/08/2015, 7:49 AM

## 2015-09-08 NOTE — Clinical Documentation Improvement (Signed)
  Orthopedic  Can the diagnosis of acute renal failure be further specified?   Acute Renal Failure/Acute Kidney Injury  Acute Tubular Necrosis  Acute Renal Cortical Necrosis  Acute Renal Medullary Necrosis  Acute on Chronic Renal Failure  Chronic Renal Failure  Other  Clinically Undetermined  Document any associated diagnoses/conditions.   Supporting Information: BUN 25, 28; Creatinine - 1.46, 1.63; GFR 41, 36   Please exercise your independent, professional judgment when responding. A specific answer is not anticipated or expected.   Thank You,  [place] [phone]

## 2015-09-08 NOTE — Progress Notes (Signed)
Physical Therapy Treatment Patient Details Name: [name] MRN: [id number] DOB: 07-08-1925 Today's Date: 09/08/2015    History of Present Illness Pt s/p R THR revision to constrained liner 2* repeated dislocations    PT Comments    Pt mobilizing well and eager for return home.  Reviewed THP, stairs and car transfer.  Follow Up Recommendations  Home health PT     Equipment Recommendations  Rolling walker with 5" wheels    Recommendations for Other Services OT consult     Precautions / Restrictions Precautions Precautions: Fall;Posterior Hip Precaution Booklet Issued: Yes (comment) Precaution Comments: Pt recalls 2/3 THP without cues; All THP reviewed Restrictions Weight Bearing Restrictions: No Other Position/Activity Restrictions: WBAT    Mobility  Bed Mobility Overal bed mobility: Needs Assistance Bed Mobility: Supine to Sit     Supine to sit: Supervision        Transfers   Equipment used: Rolling walker (2 wheeled) Transfers: Sit to/from Stand Sit to Stand: Supervision         General transfer comment: min cues for LE management  Ambulation/Gait Ambulation/Gait assistance: Min guard;Supervision Ambulation Distance (Feet): 200 Feet (and 100) Assistive device: Rolling walker (2 wheeled) Gait Pattern/deviations: Step-to pattern;Step-through pattern;Decreased step length - right;Decreased step length - left;Shuffle Gait velocity: mod pace   General Gait Details: cues for posture and position from RW   Stairs Stairs: Yes Stairs assistance: Min guard Stair Management: One rail Left;Forwards;With crutches;Step to pattern Number of Stairs: 4 General stair comments: min cues for crutch placement  Wheelchair Mobility    Modified Rankin (Stroke Patients Only)       Balance                                    Cognition Arousal/Alertness: Awake/alert Behavior During Therapy: WFL for tasks  assessed/performed;Impulsive Overall Cognitive Status: Within Functional Limits for tasks assessed                      Exercises Total Joint Exercises Ankle Circles/Pumps: AROM;Both;15 reps;Supine Quad Sets: AROM;Both;10 reps;Supine Gluteal Sets: AROM;Both;10 reps;Supine Heel Slides: AAROM;Right;Supine;20 reps Hip ABduction/ADduction: AAROM;Right;15 reps;Supine Long Arc Quad: AROM;Seated;10 reps;Both    General Comments        Pertinent Vitals/Pain Pain Assessment: 0-10 Pain Score: 3  Pain Location: R hip Pain Descriptors / Indicators: Aching;Sore Pain Intervention(s): Limited activity within patient's tolerance;Monitored during session;Premedicated before session;Ice applied    Home Living                      Prior Function            PT Goals (current goals can now be found in the care plan section) Acute Rehab PT Goals Patient Stated Goal: Resume previous active lifestyle with no more hip dislocations PT Goal Formulation: With patient Time For Goal Achievement: 09/11/15 Potential to Achieve Goals: Good Progress towards PT goals: Progressing toward goals    Frequency  7X/week    PT Plan Current plan remains appropriate    Co-evaluation             End of Session Equipment Utilized During Treatment: Gait belt Activity Tolerance: Patient tolerated treatment well Patient left: in chair;with call bell/phone within reach     Time: 0802-0831 PT Time Calculation (min) (ACUTE ONLY): 29 min  Charges:  $Gait Training: 8-22 mins $Therapeutic Exercise: 8-22 mins  G Codes:      BRADSHAW,HUNTER 09-20-2015, 9:02 AM

## 2015-09-08 NOTE — Progress Notes (Signed)
OT Cancellation Note  Patient Details Name: [name] MRN: [id number] DOB: [birth year]   Cancelled Treatment:    Reason Eval/Treat Not Completed: Other (comment)pt. politely declined skilled OT stating has has had 3 previous sx. And does not feel he needs review.  Discussed bathing options.  He states he does not need to review tub/shower transfer as he plans on sponge bathing initially and then has all DME for safe tub/transfer when ready.  Offered review of any other ADLS or concerns and he declined.  Eager for d/c later today.    Janice Coffin, COTA/L 09/08/2015, 9:48 AM

## 2015-09-11 ENCOUNTER — Other Ambulatory Visit: Payer: Self-pay | Admitting: *Deleted

## 2015-09-11 MED ORDER — VALSARTAN-HYDROCHLOROTHIAZIDE 160-12.5 MG PO TABS: 1.0000 | ORAL_TABLET | Freq: Every day | ORAL | Status: DC

## 2015-09-11 NOTE — Discharge Summary (Signed)
Physician Discharge Summary   Patient ID: [name] MRN: [id number] DOB/AGE: 79/06/1925 79 y.o.  Admit date: 09/06/2015 Discharge date: 09/08/2015  Primary Diagnosis: Recurrent right total hip dislocation  Admission Diagnoses:  Past Medical History  Diagnosis Date  . Coronary artery disease   . Left hip pain     Chronic left hip discomfort  . 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 Tachycardia     History of paroxysmal supraventricular tachycardia  . Hypercholesterolemia   . History of BPH   . Osteoarthritis   . Hypertension   . Diverticulosis   . SVT (supraventricular tachycardia)   . Tubular adenoma   . BPH (benign prostatic hyperplasia)   . IBS (irritable bowel syndrome)   . Kidney stone   . Family history of adverse reaction to anesthesia     SISTER DIED AGE 18 DURING TONSILLECTOMY 50 YRS AGO UNKNOWN CAUSE   . Myocardial infarction 2001    Previous anterolateral myocardial infarction with presistent ST-T wave changes  . GERD (gastroesophageal reflux disease)   . Sleep apnea     uses cpap  . Chronic sinus bradycardia    Discharge Diagnoses:   Principal Problem:   Mechanical instability of hip prosthesis  Estimated body mass index is 26.32 kg/(m^2) as calculated from the following:   Height as of this encounter: 5' 6" (1.676 m).   Weight as of this encounter: 73.936 kg (163 lb).  Procedure:  Procedure(s) (LRB): RIGHT TOTAL HIP ACETABULAR LINER REVISION (CONSTRAINED LINER) (Right)   Consults: None  HPI: The patient is a 81 year old male who presents for a right total hip acetabular revision. The patient comes in several months out from closed reduction, prior history of right total hip arthroplasty. The patient states that he is doing okay. Patient states that he is doing well until his hip pops out. He said that it has dislocated several times now. He had a bad experience the last time he went to [facility]. He  said he was sitting there for long time without any medications. He has had several dislocations now and has grown tired of it. We previously discussed revision to constrained liner but he was never interested in doing that. His family is pushing him to do it now and he is at a stage now where he wants to get that done, so he is not to worry about dislocating anymore. He is admitted for revision surgery.  They have been treated conservatively in the past for the above stated problem and despite conservative measures, they continue to have recurring dislocations of the hip. They have failed non-operative management and also mulitple closed reductions for dislocations. It is felt that they would benefit from undergoing total joint revision and conversion to a constrained liner. Risks and benefits of the procedure have been discussed with the patient and they elect to proceed with surgery. There are no active contraindications to surgery such as ongoing infection or rapidly progressive neurological disease.   Laboratory Data: Admission on 09/06/2015, Discharged on 09/08/2015  Component Date Value Ref Range Status  . WBC 09/07/2015 7.9  4.0 - 10.5 K/uL Final  . RBC 09/07/2015 3.40* 4.22 - 5.81 MIL/uL Final  . Hemoglobin 09/07/2015 10.3* 13.0 - 17.0 g/dL Final  . HCT 09/07/2015 31.7* 39.0 - 52.0 % Final  . MCV 09/07/2015 93.2  78.0 - 100.0 fL Final  . MCH 09/07/2015 30.3  26.0 - 34.0 pg Final  .   MCHC 09/07/2015 32.5  30.0 - 36.0 g/dL Final  . RDW 09/07/2015 13.1  11.5 - 15.5 % Final  . Platelets 09/07/2015 129* 150 - 400 K/uL Final  . Sodium 09/07/2015 138  135 - 145 mmol/L Final  . Potassium 09/07/2015 4.3  3.5 - 5.1 mmol/L Final  . Chloride 09/07/2015 106  101 - 111 mmol/L Final  . CO2 09/07/2015 26  22 - 32 mmol/L Final  . Glucose, Bld 09/07/2015 121* 65 - 99 mg/dL Final  . BUN 09/07/2015 25* 6 - 20 mg/dL Final  . Creatinine, Ser 09/07/2015 1.46* 0.61 - 1.24 mg/dL Final  . Calcium 09/07/2015  8.5* 8.9 - 10.3 mg/dL Final  . GFR calc non Af Amer 09/07/2015 41* >60 mL/min Final  . GFR calc Af Amer 09/07/2015 48* >60 mL/min Final   Comment: (NOTE) The eGFR has been calculated using the CKD EPI equation. This calculation has not been validated in all clinical situations. eGFR's persistently <60 mL/min signify possible Chronic Kidney Disease.   . Anion gap 09/07/2015 6  5 - 15 Final  . WBC 09/08/2015 8.9  4.0 - 10.5 K/uL Final  . RBC 09/08/2015 3.43* 4.22 - 5.81 MIL/uL Final  . Hemoglobin 09/08/2015 10.6* 13.0 - 17.0 g/dL Final  . HCT 09/08/2015 31.8* 39.0 - 52.0 % Final  . MCV 09/08/2015 92.7  78.0 - 100.0 fL Final  . MCH 09/08/2015 30.9  26.0 - 34.0 pg Final  . MCHC 09/08/2015 33.3  30.0 - 36.0 g/dL Final  . RDW 09/08/2015 13.4  11.5 - 15.5 % Final  . Platelets 09/08/2015 133* 150 - 400 K/uL Final  . Sodium 09/08/2015 141  135 - 145 mmol/L Final  . Potassium 09/08/2015 4.2  3.5 - 5.1 mmol/L Final  . Chloride 09/08/2015 109  101 - 111 mmol/L Final  . CO2 09/08/2015 24  22 - 32 mmol/L Final  . Glucose, Bld 09/08/2015 107* 65 - 99 mg/dL Final  . BUN 09/08/2015 28* 6 - 20 mg/dL Final  . Creatinine, Ser 09/08/2015 1.63* 0.61 - 1.24 mg/dL Final  . Calcium 09/08/2015 8.5* 8.9 - 10.3 mg/dL Final  . GFR calc non Af Amer 09/08/2015 36* >60 mL/min Final  . GFR calc Af Amer 09/08/2015 42* >60 mL/min Final   Comment: (NOTE) The eGFR has been calculated using the CKD EPI equation. This calculation has not been validated in all clinical situations. eGFR's persistently <60 mL/min signify possible Chronic Kidney Disease.   Georgiann Hahn gap 09/08/2015 8  5 - 15 Final  Hospital Outpatient Visit on 08/31/2015  Component Date Value Ref Range Status  . aPTT 08/31/2015 27  24 - 37 seconds Final  . WBC 08/31/2015 6.0  4.0 - 10.5 K/uL Final  . RBC 08/31/2015 3.93* 4.22 - 5.81 MIL/uL Final  . Hemoglobin 08/31/2015 12.2* 13.0 - 17.0 g/dL Final  . HCT 08/31/2015 37.0* 39.0 - 52.0 % Final  . MCV  08/31/2015 94.1  78.0 - 100.0 fL Final  . MCH 08/31/2015 31.0  26.0 - 34.0 pg Final  . MCHC 08/31/2015 33.0  30.0 - 36.0 g/dL Final  . RDW 08/31/2015 13.4  11.5 - 15.5 % Final  . Platelets 08/31/2015 139* 150 - 400 K/uL Final  . Sodium 08/31/2015 141  135 - 145 mmol/L Final  . Potassium 08/31/2015 5.3* 3.5 - 5.1 mmol/L Final   NO VISIBLE HEMOLYSIS  . Chloride 08/31/2015 107  101 - 111 mmol/L Final  . CO2 08/31/2015 29  22 - 32  mmol/L Final  . Glucose, Bld 08/31/2015 118* 65 - 99 mg/dL Final  . BUN 08/31/2015 27* 6 - 20 mg/dL Final  . Creatinine, Ser 08/31/2015 1.47* 0.61 - 1.24 mg/dL Final  . Calcium 08/31/2015 9.3  8.9 - 10.3 mg/dL Final  . Total Protein 08/31/2015 6.6  6.5 - 8.1 g/dL Final  . Albumin 08/31/2015 3.8  3.5 - 5.0 g/dL Final  . AST 08/31/2015 31  15 - 41 U/L Final  . ALT 08/31/2015 18  17 - 63 U/L Final  . Alkaline Phosphatase 08/31/2015 82  38 - 126 U/L Final  . Total Bilirubin 08/31/2015 0.5  0.3 - 1.2 mg/dL Final  . GFR calc non Af Amer 08/31/2015 41* >60 mL/min Final  . GFR calc Af Amer 08/31/2015 48* >60 mL/min Final   Comment: (NOTE) The eGFR has been calculated using the CKD EPI equation. This calculation has not been validated in all clinical situations. eGFR's persistently <60 mL/min signify possible Chronic Kidney Disease.   . Anion gap 08/31/2015 5  5 - 15 Final  . Prothrombin Time 08/31/2015 15.1  11.6 - 15.2 seconds Final  . INR 08/31/2015 1.17  0.00 - 1.49 Final  . ABO/RH(D) 08/31/2015 O POS   Final  . Antibody Screen 08/31/2015 NEG   Final  . Sample Expiration 08/31/2015 09/09/2015   Final  . Color, Urine 08/31/2015 YELLOW  YELLOW Final  . APPearance 08/31/2015 CLEAR  CLEAR Final  . Specific Gravity, Urine 08/31/2015 1.011  1.005 - 1.030 Final  . pH 08/31/2015 5.0  5.0 - 8.0 Final  . Glucose, UA 08/31/2015 NEGATIVE  NEGATIVE mg/dL Final  . Hgb urine dipstick 08/31/2015 NEGATIVE  NEGATIVE Final  . Bilirubin Urine 08/31/2015 NEGATIVE  NEGATIVE Final    . Ketones, ur 08/31/2015 NEGATIVE  NEGATIVE mg/dL Final  . Protein, ur 08/31/2015 NEGATIVE  NEGATIVE mg/dL Final  . Urobilinogen, UA 08/31/2015 0.2  0.0 - 1.0 mg/dL Final  . Nitrite 08/31/2015 NEGATIVE  NEGATIVE Final  . Leukocytes, UA 08/31/2015 NEGATIVE  NEGATIVE Final   MICROSCOPIC NOT DONE ON URINES WITH NEGATIVE PROTEIN, BLOOD, LEUKOCYTES, NITRITE, OR GLUCOSE <1000 mg/dL.  . MRSA, PCR 08/31/2015 NEGATIVE  NEGATIVE Final  . Staphylococcus aureus 08/31/2015 NEGATIVE  NEGATIVE Final   Comment:        The Xpert SA Assay (FDA approved for NASAL specimens in patients over 21 years of age), is one component of a comprehensive surveillance program.  Test performance has been validated by [facility] for patients greater than or equal to 1 year old. It is not intended to diagnose infection nor to guide or monitor treatment.      X-Rays:Dg Pelvis Portable  09/06/2015   CLINICAL DATA:  Postop right hip surgery.  Initial encounter.  EXAM: PORTABLE PELVIS 1-2 VIEWS  COMPARISON:  Radiographs 03/17/2015.  FINDINGS: 1532 hours. Interval right acetabular liner revision. The screw fixed acetabular component appears unchanged. The femoral prosthesis appears unchanged. No evidence of acute fracture or dislocation. Surgical drain in place. Previous left total hip arthroplasty appears stable.  IMPRESSION: No complication following right acetabular liner revision.   Electronically Signed   By: William  Veazey M.D.   On: 09/06/2015 15:48    EKG: Orders placed or performed in visit on 05/16/15  . EKG 12-Lead     Hospital Course: Kipper E Ates is a 81 y.o. who was admitted to [facility]. They were brought to the operating room on 09/06/2015 and underwent Procedure(s): RIGHT TOTAL HIP ACETABULAR   LINER REVISION (CONSTRAINED LINER).  Patient tolerated the procedure well and was later transferred to the recovery room and then to the orthopaedic floor for postoperative care.  They were given PO  and IV analgesics for pain control following their surgery.  They were given 24 hours of postoperative antibiotics of  Anti-infectives    Start     Dose/Rate Route Frequency Ordered Stop   09/06/15 2000  ceFAZolin (ANCEF) IVPB 2 g/50 mL premix     2 g 100 mL/hr over 30 Minutes Intravenous Every 6 hours 09/06/15 1750 09/07/15 0239   09/06/15 1129  ceFAZolin (ANCEF) IVPB 2 g/50 mL premix     2 g 100 mL/hr over 30 Minutes Intravenous On call to O.R. 09/06/15 1129 09/06/15 1324     and started on DVT prophylaxis in the form of Plavix.   PT and OT were ordered for total joint protocol.  Discharge planning consulted to help with postop disposition and equipment needs.  Patient had a fair night on the evening of surgery. He had some issues with bradycardia and hypotension, therefore his lopressor was held. He started to get up OOB WBAT with therapy on day one.  Continued to work with therapy into day two.  Dressing was changed on day two and the incision was clean and dry.  Incision was healing well.  Patient was seen in rounds and was ready to go home.   Diet: Cardiac diet Activity:WBAT Follow-up:in 2 weeks Disposition - Home Discharged Condition: stable   Discharge Instructions    Call MD / Call 911    Complete by:  As directed   If you experience chest pain or shortness of breath, CALL 911 and be transported to the hospital emergency room.  If you develope a fever above 101 F, pus (white drainage) or increased drainage or redness at the wound, or calf pain, call your surgeon's office.     Constipation Prevention    Complete by:  As directed   Drink plenty of fluids.  Prune juice may be helpful.  You may use a stool softener, such as Colace (over the counter) 100 mg twice a day.  Use MiraLax (over the counter) for constipation as needed.     Diet - low sodium heart healthy    Complete by:  As directed      Discharge instructions    Complete by:  As directed   INSTRUCTIONS AFTER JOINT  REPLACEMENT   DRESSING / WOUND CARE / SHOWERING You may change your dressing 3-5 days after surgery.  Then change the dressing every day with sterile gauze.  Please use good hand washing techniques before changing the dressing.  Do not use any lotions or creams on the incision until instructed by your surgeon.   WEIGHT BEARING  Weight bearing as tolerated with assist device (walker, cane, etc) as directed, use it as long as suggested by your surgeon or therapist, typically at least 4-6 weeks.     Increase activity slowly as tolerated    Complete by:  As directed             Medication List    TAKE these medications        aspirin 325 MG EC tablet  Take 1 tablet (325 mg total) by mouth daily with breakfast.     CALCIUM-VITAMIN D PO  Take 1 tablet by mouth daily with breakfast.     clopidogrel 75 MG tablet  Commonly known as:  PLAVIX  Take   1 tablet (75 mg total) by mouth daily.     donepezil 10 MG tablet  Commonly known as:  ARICEPT  Take 10 mg by mouth at bedtime.     finasteride 5 MG tablet  Commonly known as:  PROSCAR  Take 5 mg by mouth daily.     FISH OIL PO  Take 1,600 mg by mouth at bedtime.     GLUCOSAMINE CHONDR COMPLEX PO  Take 1 tablet by mouth at bedtime.     methocarbamol 500 MG tablet  Commonly known as:  ROBAXIN  Take 1 tablet (500 mg total) by mouth every 6 (six) hours as needed for muscle spasms.     metoprolol 50 MG tablet  Commonly known as:  LOPRESSOR  Take 25-50 mg by mouth 2 (two) times daily. Take 50 mg by mouth in the morning and 25 mg by mouth in the evening     oxyCODONE 5 MG immediate release tablet  Commonly known as:  Oxy IR/ROXICODONE  Take 1-2 tablets (5-10 mg total) by mouth every 3 (three) hours as needed for breakthrough pain.     rosuvastatin 5 MG tablet  Commonly known as:  CRESTOR  Take 1 tablet (5 mg total) by mouth at bedtime.     terazosin 10 MG capsule  Commonly known as:  HYTRIN  Take 1 capsule (10 mg total) by mouth  at bedtime.     traMADol 50 MG tablet  Commonly known as:  ULTRAM  Take 1-2 tablets (50-100 mg total) by mouth every 6 (six) hours as needed for moderate pain.     valsartan-hydrochlorothiazide 160-12.5 MG per tablet  Commonly known as:  DIOVAN-HCT  Take 1 tablet by mouth daily.           Follow-up Information    Follow up with [name], MD. Schedule an appointment as soon as possible for a visit on 09/19/2015.   Specialty:  Orthopedic Surgery   Why:  Call [phone] tomorrow to make the appointment   Contact information:   [address] [place] [address] [phone]       Follow up with [facility].   Why:  home health physical therapy   Contact information:   [place] [address] [phone]       Follow up with [facility].   Why:  rolling walker   Contact information:   [place] [address] [phone]       Signed: Ardeen Jourdain, PA-C Orthopaedic Surgery 09/11/2015, 8:41 AM

## 2015-10-10 ENCOUNTER — Other Ambulatory Visit: Payer: Self-pay | Admitting: Cardiology

## 2015-10-18 ENCOUNTER — Other Ambulatory Visit: Payer: Self-pay | Admitting: Cardiology

## 2015-10-18 MED ORDER — ROSUVASTATIN CALCIUM 5 MG PO TABS: 5.0000 mg | ORAL_TABLET | Freq: Every day | ORAL | Status: DC

## 2015-10-27 ENCOUNTER — Other Ambulatory Visit: Payer: Self-pay

## 2015-10-27 MED ORDER — METOPROLOL TARTRATE 50 MG PO TABS: 25.0000 mg | ORAL_TABLET | Freq: Two times a day (BID) | ORAL | Status: DC

## 2015-10-31 ENCOUNTER — Telehealth: Payer: Self-pay | Admitting: Cardiology

## 2015-10-31 NOTE — Telephone Encounter (Signed)
Called [facility] to clarify pt's sig. I informed them that the pt is to take Metoprolol  50 mg, taking 1 tablet in the morning and 1/2 tablet in the evening. [facility] verbalized understanding.

## 2015-10-31 NOTE — Telephone Encounter (Signed)
New message      Calling to get clarification on metoprolol 50mg  directions.  Reference # [id number].

## 2015-11-07 ENCOUNTER — Other Ambulatory Visit (HOSPITAL_COMMUNITY): Payer: Self-pay | Admitting: Physical Medicine and Rehabilitation

## 2015-11-07 ENCOUNTER — Ambulatory Visit (HOSPITAL_COMMUNITY)
Admission: RE | Admit: 2015-11-07 | Discharge: 2015-11-07 | Disposition: A | Discharge status: Home / Self Care | Payer: Medicare Other | Source: Ambulatory Visit | Attending: Cardiovascular Disease | Admitting: Cardiovascular Disease

## 2015-11-07 DIAGNOSIS — I10 Essential (primary) hypertension: Secondary | ICD-10-CM | POA: Diagnosis not present

## 2015-11-07 DIAGNOSIS — M7989 Other specified soft tissue disorders: Secondary | ICD-10-CM

## 2015-11-07 DIAGNOSIS — E7800 Pure hypercholesterolemia, unspecified: Secondary | ICD-10-CM | POA: Diagnosis not present

## 2015-11-15 ENCOUNTER — Other Ambulatory Visit: Payer: Self-pay | Admitting: Family Medicine

## 2015-11-15 ENCOUNTER — Ambulatory Visit (HOSPITAL_COMMUNITY)
Admission: RE | Admit: 2015-11-15 | Discharge: 2015-11-15 | Disposition: A | Discharge status: Home / Self Care | Payer: Medicare Other | Source: Ambulatory Visit | Attending: Internal Medicine | Admitting: Internal Medicine

## 2015-11-15 DIAGNOSIS — E7800 Pure hypercholesterolemia, unspecified: Secondary | ICD-10-CM | POA: Insufficient documentation

## 2015-11-15 DIAGNOSIS — M79605 Pain in left leg: Secondary | ICD-10-CM

## 2015-11-15 DIAGNOSIS — I10 Essential (primary) hypertension: Secondary | ICD-10-CM | POA: Diagnosis not present

## 2015-11-15 DIAGNOSIS — M7989 Other specified soft tissue disorders: Secondary | ICD-10-CM | POA: Insufficient documentation

## 2015-11-16 ENCOUNTER — Ambulatory Visit: Payer: Medicare Other | Admitting: Cardiology

## 2015-11-16 ENCOUNTER — Other Ambulatory Visit: Payer: Medicare Other

## 2015-11-22 ENCOUNTER — Ambulatory Visit (HOSPITAL_COMMUNITY)
Admission: RE | Admit: 2015-11-22 | Discharge: 2015-11-22 | Disposition: A | Discharge status: Home / Self Care | Payer: Medicare Other | Source: Ambulatory Visit | Attending: Family Medicine | Admitting: Family Medicine

## 2015-11-22 ENCOUNTER — Other Ambulatory Visit (HOSPITAL_COMMUNITY): Payer: Self-pay | Admitting: Family Medicine

## 2015-11-22 DIAGNOSIS — R0602 Shortness of breath: Secondary | ICD-10-CM | POA: Insufficient documentation

## 2015-11-22 DIAGNOSIS — I517 Cardiomegaly: Secondary | ICD-10-CM | POA: Insufficient documentation

## 2015-11-22 DIAGNOSIS — R600 Localized edema: Secondary | ICD-10-CM | POA: Insufficient documentation

## 2015-11-22 DIAGNOSIS — I272 Other secondary pulmonary hypertension: Secondary | ICD-10-CM | POA: Insufficient documentation

## 2015-11-22 DIAGNOSIS — J90 Pleural effusion, not elsewhere classified: Secondary | ICD-10-CM | POA: Insufficient documentation

## 2015-11-22 MED ORDER — TECHNETIUM TO 99M ALBUMIN AGGREGATED: 4.0000 | Freq: Once | INTRAVENOUS | Status: DC | PRN

## 2015-11-22 MED ORDER — TECHNETIUM TC 99M DIETHYLENETRIAME-PENTAACETIC ACID: 30.0000 | Freq: Once | INTRAVENOUS | Status: DC | PRN

## 2015-12-08 ENCOUNTER — Other Ambulatory Visit (HOSPITAL_COMMUNITY): Payer: Medicare Other

## 2016-01-15 ENCOUNTER — Ambulatory Visit (INDEPENDENT_AMBULATORY_CARE_PROVIDER_SITE_OTHER): Payer: Medicare Other | Admitting: Cardiology

## 2016-01-15 ENCOUNTER — Encounter: Payer: Self-pay | Admitting: Cardiology

## 2016-01-15 ENCOUNTER — Other Ambulatory Visit (INDEPENDENT_AMBULATORY_CARE_PROVIDER_SITE_OTHER): Payer: Medicare Other | Admitting: *Deleted

## 2016-01-15 VITALS — BP 142/64 | HR 44 | Ht 66.0 in | Wt 161.0 lb

## 2016-01-15 DIAGNOSIS — E7800 Pure hypercholesterolemia, unspecified: Secondary | ICD-10-CM

## 2016-01-15 DIAGNOSIS — I259 Chronic ischemic heart disease, unspecified: Secondary | ICD-10-CM

## 2016-01-15 DIAGNOSIS — I471 Supraventricular tachycardia: Secondary | ICD-10-CM

## 2016-01-15 DIAGNOSIS — R001 Bradycardia, unspecified: Secondary | ICD-10-CM | POA: Diagnosis not present

## 2016-01-15 LAB — HEPATIC FUNCTION PANEL
ALK PHOS: 92 U/L (ref 40–115)
ALT: 19 U/L (ref 9–46)
AST: 29 U/L (ref 10–35)
Albumin: 3.9 g/dL (ref 3.6–5.1)
BILIRUBIN DIRECT: 0.2 mg/dL (ref <=0.2)
BILIRUBIN INDIRECT: 0.3 mg/dL (ref 0.2–1.2)
BILIRUBIN TOTAL: 0.5 mg/dL (ref 0.2–1.2)
Total Protein: 6.8 g/dL (ref 6.1–8.1)

## 2016-01-15 LAB — LIPID PANEL
Cholesterol: 140 mg/dL (ref 125–200)
HDL: 55 mg/dL (ref >=40)
LDL CALC: 72 mg/dL (ref <130)
TRIGLYCERIDES: 65 mg/dL (ref <150)
Total CHOL/HDL Ratio: 2.5 Ratio (ref <=5.0)
VLDL: 13 mg/dL (ref <30)

## 2016-01-15 LAB — BASIC METABOLIC PANEL
BUN: 33 mg/dL — ABNORMAL HIGH (ref 7–25)
CALCIUM: 9.4 mg/dL (ref 8.6–10.3)
CHLORIDE: 104 mmol/L (ref 98–110)
CO2: 26 mmol/L (ref 20–31)
Creat: 1.57 mg/dL — ABNORMAL HIGH (ref 0.70–1.11)
GLUCOSE: 98 mg/dL (ref 65–99)
POTASSIUM: 4.4 mmol/L (ref 3.5–5.3)
Sodium: 140 mmol/L (ref 135–146)

## 2016-01-15 NOTE — Progress Notes (Signed)
Cardiology Office Note   Date:  01/15/2016   ID:  KAHLIF Randolph, DOB 1925-04-21, MRN [id number]  PCP:  Charles Downing, Randolph  Cardiologist: [name]  Chief Complaint  Patient presents with  . scheduled follow up      History of Present Illness: [name] is a 82 y.o. male who presents for a scheduled 6 month follow-up visit  . He has a history of ischemic heart disease. He had angioplasty and stent in 2002. He had another angioplasty in 2003. In November 2009 he had a nuclear stress test which showed no reversible ischemia. On 02/28/11 he had another nuclear stress test as preop clearance for a hip revision procedure and there was no evidence of ischemia but there was an old scar present. Patient has a past history of paroxysmal supraventricular tachycardia. In addition the patient has a history of osteoarthritis. He has had dyslipidemia and is on Crestor. He has had prior bilateral hip replacements. 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 [name] He has had some memory problems and is on generic Aricept. The patient has occasional episodes of paroxysmal supraventricular tachycardia. It often occurs if he ends over or strains too hard. He is able to stop the tachycardia by drinking cold water. The patient has chronic sinus bradycardia. This is asymptomatic. He has no dizziness or syncope. Since we last saw him he has seen Dr. Arelia Randolph.  The patient had had some swelling of his left leg.  He had venous Dopplers on 11/15/15 which did not show any DVT.  In December 2016 he had a VQ scan which showed no pulmonary emboli.  A chest x-ray at that time showed mild CHF.  Recently he has been on half of an 80 mg furosemide daily which has helped his peripheral edema.  Past Medical History  Diagnosis Date  . Coronary artery disease   . Left hip pain     Chronic left hip  discomfort  . 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 Tachycardia     History of paroxysmal supraventricular tachycardia  . Hypercholesterolemia   . History of BPH   . Osteoarthritis   . Hypertension   . Diverticulosis   . SVT (supraventricular tachycardia) ([place])   . Tubular adenoma   . BPH (benign prostatic hyperplasia)   . IBS (irritable bowel syndrome)   . Kidney stone   . Family history of adverse reaction to anesthesia     SISTER DIED AGE 17 DURING TONSILLECTOMY 50 YRS AGO UNKNOWN CAUSE   . Myocardial infarction [facility]) 2001    Previous anterolateral myocardial infarction with presistent ST-T wave changes  . GERD (gastroesophageal reflux disease)   . Sleep apnea     uses cpap  . Chronic sinus bradycardia     Past Surgical History  Procedure Laterality Date  . Cardiac catheterization  05/07/2002    Ejection fraction 20%  . Angioplasty  2002    with stent and angioplasty in 2003  . Hip closed reduction  01/31/2012    Procedure: CLOSED MANIPULATION HIP;  Surgeon: [name], Randolph;  Location: [facility];  Service: Orthopedics;  Laterality: Right;  closed reduction right dislocated hip  . Appendectomy    . Tonsillectomy    . Hip closed reduction  10/10/2012    Procedure: CLOSED MANIPULATION HIP;  Surgeon: Charles Cabal, Randolph;  Location: [facility];  Service: Orthopedics;  Laterality: Right;  . Transurethral resection of prostate    . Back surgery    . Finger surgery    . Hip closed reduction Right 03/17/2015    Procedure: CLOSED REDUCTION HIP;  Surgeon: Charles Can, Randolph;  Location: [facility];  Service: Orthopedics;  Laterality: Right;  . Joint replacement      BILATERAL HIP REPLACEMENTS  . Total hip revision Right 09/06/2015    Procedure: RIGHT TOTAL HIP ACETABULAR LINER REVISION (CONSTRAINED LINER);  Surgeon: Charles Arabian, Randolph;  Location: [facility];  Service: Orthopedics;  Laterality: Right;     Current Outpatient Prescriptions  Medication  Sig Dispense Refill  . aspirin EC 325 MG EC tablet Take 1 tablet (325 mg total) by mouth daily with breakfast. 30 tablet 0  . CALCIUM-VITAMIN D PO Take 1 tablet by mouth daily with breakfast.    . clopidogrel (PLAVIX) 75 MG tablet Take 1 tablet (75 mg total) by mouth daily. 30 tablet 0  . clopidogrel (PLAVIX) 75 MG tablet Take 1 tablet (75 mg total) by mouth daily. 30 tablet 5  . donepezil (ARICEPT) 10 MG tablet Take 10 mg by mouth at bedtime.    . enalapril (VASOTEC) 2.5 MG tablet Take 2.5 mg by mouth daily.    . Ferrous Sulfate (IRON) 325 (65 Fe) MG TABS Take 1 tablet by mouth 2 (two) times daily.    . finasteride (PROSCAR) 5 MG tablet Take 5 mg by mouth daily.    . furosemide (LASIX) 80 MG tablet Take 80 mg by mouth as directed. 1/2 TABLET BY MOUTH DAILY    . Glucosamine-Chondroitin (GLUCOSAMINE CHONDR COMPLEX PO) Take 1 tablet by mouth at bedtime.    . methocarbamol (ROBAXIN) 500 MG tablet Take 1 tablet (500 mg total) by mouth every 6 (six) hours as needed for muscle spasms. 40 tablet 1  . metoprolol (LOPRESSOR) 50 MG tablet Take 0.5-1 tablets (25-50 mg total) by mouth 2 (two) times daily. Take 50 mg by mouth in the morning and 25 mg by mouth in the evening 135 tablet 0  . Omega-3 Fatty Acids (FISH OIL PO) Take 1,600 mg by mouth at bedtime.    [name] oxyCODONE (OXY IR/ROXICODONE) 5 MG immediate release tablet Take 1-2 tablets (5-10 mg total) by mouth every 3 (three) hours as needed for breakthrough pain. 60 tablet 0  . rosuvastatin (CRESTOR) 5 MG tablet Take 1 tablet (5 mg total) by mouth at bedtime. 90 tablet 1  . terazosin (HYTRIN) 10 MG capsule Take 1 capsule (10 mg total) by mouth at bedtime. 90 capsule 2  . traMADol (ULTRAM) 50 MG tablet Take 1-2 tablets (50-100 mg total) by mouth every 6 (six) hours as needed for moderate pain. 60 tablet 0  . valsartan-hydrochlorothiazide (DIOVAN-HCT) 160-12.5 MG per tablet Take 1 tablet by mouth daily. 10 tablet 0   No current facility-administered  medications for this visit.    Allergies:   Prilosec; Tylenol; and Prednisone    Social History:  The patient  reports that he has never smoked. He has never used smokeless tobacco. He reports that he does not drink alcohol or use illicit drugs.   Family History:  The patient's family history includes Alzheimer's disease in his mother; Heart attack in his father; Stroke in his father and mother.    ROS:  Please see the history of present illness.   Otherwise, review of systems are positive for none.  All other systems are reviewed and negative.    PHYSICAL EXAM: VS:  BP 142/64 mmHg  Pulse 44  Ht 5\' 6"  (1.676 m)  Wt 161 lb (73.029 kg)  BMI 26.00 kg/m2 , BMI Body mass index is 26 kg/(m^2). GEN: Well nourished, well developed, in no acute distress HEENT: normal Neck: no JVD, carotid bruits, or masses Cardiac: RRR; no murmurs, rubs, or gallops,no edema  Respiratory:  clear to auscultation bilaterally, normal work of breathing GI: soft, nontender, nondistended, + BS MS: no deformity or atrophy Skin: warm and dry, no rash Neuro:  Strength and sensation are intact Psych: euthymic mood, full affect   EKG:  EKG is not ordered today.    Recent Labs: 08/31/2015: ALT 18 09/08/2015: BUN 28*; Creatinine, Ser 1.63*; Hemoglobin 10.6*; Platelets 133*; Potassium 4.2; Sodium 141    Lipid Panel    Component Value Date/Time   CHOL 146 05/16/2015 1149   TRIG 64.0 05/16/2015 1149   HDL 48.10 05/16/2015 1149   CHOLHDL 3 05/16/2015 1149   VLDL 12.8 05/16/2015 1149   LDLCALC 85 05/16/2015 1149      Wt Readings from Last 3 Encounters:  01/15/16 161 lb (73.029 kg)  09/06/15 163 lb (73.936 kg)  08/31/15 163 lb (73.936 kg)        ASSESSMENT AND PLAN:  1. ischemic heart disease status post angioplasty and stent in 2002 and again in 2003. Most recent nuclear stress test 2012 showing no ischemia but there was old scar present. 2. Hypercholesterolemia 3. hypertensive heart disease  without CHF 4. Marked sinus bradycardia, asymptomatic 5. History of Recurrent dislocations of right hip.   Current medicines are reviewed at length with the patient today.  The patient does not have concerns regarding medicines.  The following changes have been made:  no change  Labs/ tests ordered today include:   Orders Placed This Encounter  Procedures  . Lipid panel  . Hepatic function panel  . Basic metabolic panel    Disposition: We are checking labs today.  We are checking lipid panel hepatic function panel and basal metabolic panel. Following my retirement the patient was like to be followed by Dr. Wynonia Lawman.  The patient will contact Dr. Thurman Coyer office.  Continue current medication.  Berna Spare Randolph 01/15/2016 1:32 PM    [facility] [place], [place],   [address] Phone: [phone]; Fax: [phone]

## 2016-01-15 NOTE — Patient Instructions (Signed)
Medication Instructions:  Your physician recommends that you continue on your current medications as directed. Please refer to the Current Medication list given to you today.  Labwork: LP/BMET/HFP  Testing/Procedures: NONE  Follow-Up: WITH DR Wynonia Lawman AS NEEDED   If you need a refill on your cardiac medications before your next appointment, please call your pharmacy.

## 2016-01-16 ENCOUNTER — Telehealth: Payer: Self-pay | Admitting: Cardiology

## 2016-01-16 NOTE — Progress Notes (Signed)
Quick Note:  Please report to patient. The recent labs are stable. Continue same medication and careful diet. ______ 

## 2016-01-16 NOTE — Telephone Encounter (Signed)
Informed patient of results and verbal understanding expressed.  

## 2016-01-16 NOTE — Telephone Encounter (Signed)
-----   Message from [name], MD sent at 01/16/2016  7:07 AM EST ----- Please report to patient.  The recent labs are stable. Continue same medication and careful diet.

## 2016-01-16 NOTE — Telephone Encounter (Signed)
New message ° ° ° ° ° ° °Calling to get lab results °

## 2016-01-18 ENCOUNTER — Encounter: Payer: Self-pay | Admitting: Cardiology

## 2016-01-26 ENCOUNTER — Other Ambulatory Visit: Payer: Self-pay | Admitting: Cardiology

## 2016-02-06 ENCOUNTER — Other Ambulatory Visit: Payer: Self-pay | Admitting: Cardiology

## 2016-02-09 ENCOUNTER — Other Ambulatory Visit: Payer: Self-pay | Admitting: *Deleted

## 2016-02-09 MED ORDER — TERAZOSIN HCL 10 MG PO CAPS: 10.0000 mg | ORAL_CAPSULE | Freq: Every day | ORAL | Status: DC

## 2016-02-15 ENCOUNTER — Other Ambulatory Visit: Payer: Self-pay | Admitting: Cardiology

## 2016-03-19 ENCOUNTER — Telehealth: Payer: Self-pay | Admitting: *Deleted

## 2016-03-19 NOTE — Telephone Encounter (Signed)
[name] had [facility] call our office for medication refills under Charles. [name] name. After reviewing the last office note I read that [name] had asked to follow up with [name] after [name] retirement. I called the patient and explained to him that our office can no longer fill prescriptions under [name] it was then that he remembered he had forgotten to contact [name] office. He will get further refills from [name]'s office.

## 2016-04-04 ENCOUNTER — Other Ambulatory Visit: Payer: Self-pay | Admitting: Family Medicine

## 2016-04-04 DIAGNOSIS — R221 Localized swelling, mass and lump, neck: Secondary | ICD-10-CM

## 2016-04-05 ENCOUNTER — Other Ambulatory Visit: Payer: Self-pay | Admitting: *Deleted

## 2016-04-08 ENCOUNTER — Encounter: Payer: Self-pay | Admitting: Cardiology

## 2016-04-11 ENCOUNTER — Ambulatory Visit
Admission: RE | Admit: 2016-04-11 | Discharge: 2016-04-11 | Disposition: A | Discharge status: Home / Self Care | Payer: Medicare Other | Source: Ambulatory Visit | Attending: Family Medicine | Admitting: Family Medicine

## 2016-04-11 DIAGNOSIS — R221 Localized swelling, mass and lump, neck: Secondary | ICD-10-CM

## 2016-04-11 MED ORDER — GADOBENATE DIMEGLUMINE 529 MG/ML IV SOLN
15.0000 mL | Freq: Once | INTRAVENOUS | Status: AC | PRN
  Administered 2016-04-11: 15 mL via INTRAVENOUS

## 2016-04-19 ENCOUNTER — Other Ambulatory Visit: Payer: Self-pay

## 2016-04-19 ENCOUNTER — Other Ambulatory Visit: Payer: Self-pay | Admitting: Family Medicine

## 2016-04-19 DIAGNOSIS — R9389 Abnormal findings on diagnostic imaging of other specified body structures: Secondary | ICD-10-CM

## 2016-04-25 ENCOUNTER — Ambulatory Visit
Admission: RE | Admit: 2016-04-25 | Discharge: 2016-04-25 | Disposition: A | Discharge status: Home / Self Care | Payer: Medicare Other | Source: Ambulatory Visit | Attending: Family Medicine | Admitting: Family Medicine

## 2016-04-25 DIAGNOSIS — R9389 Abnormal findings on diagnostic imaging of other specified body structures: Secondary | ICD-10-CM

## 2016-06-01 ENCOUNTER — Observation Stay (HOSPITAL_COMMUNITY)
Admission: EM | Admit: 2016-06-01 | Discharge: 2016-06-03 | Disposition: A | Discharge status: Home / Self Care | Payer: Medicare Other | Attending: Internal Medicine | Admitting: Internal Medicine

## 2016-06-01 ENCOUNTER — Encounter (HOSPITAL_COMMUNITY): Payer: Self-pay | Admitting: *Deleted

## 2016-06-01 ENCOUNTER — Other Ambulatory Visit: Payer: Self-pay

## 2016-06-01 DIAGNOSIS — R001 Bradycardia, unspecified: Secondary | ICD-10-CM | POA: Insufficient documentation

## 2016-06-01 DIAGNOSIS — E7800 Pure hypercholesterolemia, unspecified: Secondary | ICD-10-CM | POA: Diagnosis not present

## 2016-06-01 DIAGNOSIS — G4733 Obstructive sleep apnea (adult) (pediatric): Secondary | ICD-10-CM | POA: Diagnosis not present

## 2016-06-01 DIAGNOSIS — Z955 Presence of coronary angioplasty implant and graft: Secondary | ICD-10-CM | POA: Diagnosis not present

## 2016-06-01 DIAGNOSIS — Z7982 Long term (current) use of aspirin: Secondary | ICD-10-CM | POA: Insufficient documentation

## 2016-06-01 DIAGNOSIS — N179 Acute kidney failure, unspecified: Secondary | ICD-10-CM | POA: Insufficient documentation

## 2016-06-01 DIAGNOSIS — I2510 Atherosclerotic heart disease of native coronary artery without angina pectoris: Secondary | ICD-10-CM | POA: Diagnosis not present

## 2016-06-01 DIAGNOSIS — I959 Hypotension, unspecified: Secondary | ICD-10-CM | POA: Insufficient documentation

## 2016-06-01 DIAGNOSIS — N401 Enlarged prostate with lower urinary tract symptoms: Secondary | ICD-10-CM

## 2016-06-01 DIAGNOSIS — T782XXA Anaphylactic shock, unspecified, initial encounter: Secondary | ICD-10-CM | POA: Diagnosis not present

## 2016-06-01 DIAGNOSIS — Z79899 Other long term (current) drug therapy: Secondary | ICD-10-CM | POA: Diagnosis not present

## 2016-06-01 DIAGNOSIS — I252 Old myocardial infarction: Secondary | ICD-10-CM | POA: Diagnosis not present

## 2016-06-01 DIAGNOSIS — Z96643 Presence of artificial hip joint, bilateral: Secondary | ICD-10-CM | POA: Insufficient documentation

## 2016-06-01 DIAGNOSIS — I5022 Chronic systolic (congestive) heart failure: Secondary | ICD-10-CM | POA: Diagnosis present

## 2016-06-01 DIAGNOSIS — N183 Chronic kidney disease, stage 3 unspecified: Secondary | ICD-10-CM | POA: Diagnosis present

## 2016-06-01 DIAGNOSIS — E785 Hyperlipidemia, unspecified: Secondary | ICD-10-CM | POA: Diagnosis present

## 2016-06-01 DIAGNOSIS — I130 Hypertensive heart and chronic kidney disease with heart failure and stage 1 through stage 4 chronic kidney disease, or unspecified chronic kidney disease: Secondary | ICD-10-CM | POA: Insufficient documentation

## 2016-06-01 DIAGNOSIS — I25119 Atherosclerotic heart disease of native coronary artery with unspecified angina pectoris: Secondary | ICD-10-CM | POA: Diagnosis present

## 2016-06-01 DIAGNOSIS — N400 Enlarged prostate without lower urinary tract symptoms: Secondary | ICD-10-CM | POA: Insufficient documentation

## 2016-06-01 DIAGNOSIS — Z7902 Long term (current) use of antithrombotics/antiplatelets: Secondary | ICD-10-CM | POA: Diagnosis not present

## 2016-06-01 DIAGNOSIS — R55 Syncope and collapse: Secondary | ICD-10-CM | POA: Diagnosis not present

## 2016-06-01 DIAGNOSIS — I110 Hypertensive heart disease with heart failure: Secondary | ICD-10-CM | POA: Diagnosis present

## 2016-06-01 DIAGNOSIS — N138 Other obstructive and reflux uropathy: Secondary | ICD-10-CM | POA: Diagnosis present

## 2016-06-01 DIAGNOSIS — K219 Gastro-esophageal reflux disease without esophagitis: Secondary | ICD-10-CM | POA: Diagnosis present

## 2016-06-01 HISTORY — DX: Chronic kidney disease, stage 3 (moderate): N18.3

## 2016-06-01 HISTORY — DX: Chronic kidney disease, stage 3 unspecified: N18.30

## 2016-06-01 HISTORY — DX: Chronic systolic (congestive) heart failure: I50.22

## 2016-06-01 LAB — CBC WITH DIFFERENTIAL/PLATELET
Basophils Absolute: 0 10*3/uL (ref 0.0–0.1)
Basophils Relative: 0 %
EOS ABS: 0 10*3/uL (ref 0.0–0.7)
Eosinophils Relative: 0 %
HEMATOCRIT: 45.7 % (ref 39.0–52.0)
HEMOGLOBIN: 14.9 g/dL (ref 13.0–17.0)
LYMPHS ABS: 1.1 10*3/uL (ref 0.7–4.0)
Lymphocytes Relative: 8 %
MCH: 29.6 pg (ref 26.0–34.0)
MCHC: 32.6 g/dL (ref 30.0–36.0)
MCV: 90.9 fL (ref 78.0–100.0)
Monocytes Absolute: 1 10*3/uL (ref 0.1–1.0)
Monocytes Relative: 8 %
NEUTROS ABS: 10.5 10*3/uL — AB (ref 1.7–7.7)
NEUTROS PCT: 84 %
Platelets: 177 10*3/uL (ref 150–400)
RBC: 5.03 MIL/uL (ref 4.22–5.81)
RDW: 13.7 % (ref 11.5–15.5)
WBC: 12.6 10*3/uL — AB (ref 4.0–10.5)

## 2016-06-01 LAB — COMPREHENSIVE METABOLIC PANEL
ALBUMIN: 3.5 g/dL (ref 3.5–5.0)
ALK PHOS: 92 U/L (ref 38–126)
ALT: 17 U/L (ref 17–63)
AST: 30 U/L (ref 15–41)
Anion gap: 10 (ref 5–15)
BILIRUBIN TOTAL: 0.5 mg/dL (ref 0.3–1.2)
BUN: 45 mg/dL — ABNORMAL HIGH (ref 6–20)
CALCIUM: 8.7 mg/dL — AB (ref 8.9–10.3)
CO2: 23 mmol/L (ref 22–32)
CREATININE: 2.17 mg/dL — AB (ref 0.61–1.24)
Chloride: 104 mmol/L (ref 101–111)
GFR calc non Af Amer: 25 mL/min — ABNORMAL LOW (ref >60)
GFR, EST AFRICAN AMERICAN: 30 mL/min — AB (ref >60)
GLUCOSE: 160 mg/dL — AB (ref 65–99)
Potassium: 4.8 mmol/L (ref 3.5–5.1)
SODIUM: 137 mmol/L (ref 135–145)
Total Protein: 6 g/dL — ABNORMAL LOW (ref 6.5–8.1)

## 2016-06-01 LAB — I-STAT TROPONIN, ED: Troponin i, poc: 0.01 ng/mL (ref 0.00–0.08)

## 2016-06-01 MED ORDER — ROSUVASTATIN CALCIUM 10 MG PO TABS
5.0000 mg | ORAL_TABLET | Freq: Every day | ORAL | Status: DC
Start: 2016-06-02 — End: 2016-06-03
  Administered 2016-06-02: 5 mg via ORAL
  Filled 2016-06-01 (×2): qty 1

## 2016-06-01 MED ORDER — ONDANSETRON HCL 4 MG/2ML IJ SOLN: 4.0000 mg | Freq: Three times a day (TID) | INTRAMUSCULAR | Status: DC | PRN

## 2016-06-01 MED ORDER — CLOPIDOGREL BISULFATE 75 MG PO TABS
75.0000 mg | ORAL_TABLET | Freq: Every day | ORAL | Status: DC
  Administered 2016-06-02 – 2016-06-03 (×2): 75 mg via ORAL
  Filled 2016-06-01 (×3): qty 1

## 2016-06-01 MED ORDER — FAMOTIDINE IN NACL 20-0.9 MG/50ML-% IV SOLN
20.0000 mg | Freq: Two times a day (BID) | INTRAVENOUS | Status: DC
  Administered 2016-06-02 – 2016-06-03 (×2): 20 mg via INTRAVENOUS
  Filled 2016-06-01 (×4): qty 50

## 2016-06-01 MED ORDER — DEXAMETHASONE SODIUM PHOSPHATE 4 MG/ML IJ SOLN
4.0000 mg | Freq: Three times a day (TID) | INTRAMUSCULAR | Status: DC
  Filled 2016-06-01 (×2): qty 1

## 2016-06-01 MED ORDER — SODIUM CHLORIDE 0.9 % IV SOLN
INTRAVENOUS | Status: DC
  Administered 2016-06-02: 04:00:00 via INTRAVENOUS

## 2016-06-01 MED ORDER — METHYLPREDNISOLONE SODIUM SUCC 125 MG IJ SOLR: 125.0000 mg | Freq: Once | INTRAMUSCULAR | Status: DC

## 2016-06-01 MED ORDER — DEXAMETHASONE SODIUM PHOSPHATE 4 MG/ML IJ SOLN
4.0000 mg | Freq: Once | INTRAMUSCULAR | Status: AC
  Administered 2016-06-01: 4 mg via INTRAVENOUS
  Filled 2016-06-01: qty 1

## 2016-06-01 MED ORDER — DEXAMETHASONE SODIUM PHOSPHATE 4 MG/ML IJ SOLN: 4.0000 mg | Freq: Four times a day (QID) | INTRAMUSCULAR | Status: DC

## 2016-06-01 MED ORDER — SODIUM CHLORIDE 0.9 % IV SOLN: INTRAVENOUS | Status: DC

## 2016-06-01 MED ORDER — FAMOTIDINE IN NACL 20-0.9 MG/50ML-% IV SOLN
20.0000 mg | Freq: Once | INTRAVENOUS | Status: AC
  Administered 2016-06-01: 20 mg via INTRAVENOUS
  Filled 2016-06-01: qty 50

## 2016-06-01 MED ORDER — SODIUM CHLORIDE 0.9 % IV BOLUS (SEPSIS)
1000.0000 mL | Freq: Once | INTRAVENOUS | Status: AC
  Administered 2016-06-01: 1000 mL via INTRAVENOUS

## 2016-06-01 MED ORDER — DIPHENHYDRAMINE HCL 50 MG/ML IJ SOLN
12.5000 mg | Freq: Three times a day (TID) | INTRAMUSCULAR | Status: DC
  Administered 2016-06-02 – 2016-06-03 (×5): 12.5 mg via INTRAVENOUS
  Filled 2016-06-01 (×5): qty 1

## 2016-06-01 MED ORDER — FINASTERIDE 5 MG PO TABS
5.0000 mg | ORAL_TABLET | Freq: Every day | ORAL | Status: DC
  Administered 2016-06-02 – 2016-06-03 (×2): 5 mg via ORAL
  Filled 2016-06-01 (×4): qty 1

## 2016-06-01 MED ORDER — ASPIRIN EC 325 MG PO TBEC
325.0000 mg | DELAYED_RELEASE_TABLET | Freq: Every day | ORAL | Status: DC
  Administered 2016-06-02 – 2016-06-03 (×2): 325 mg via ORAL
  Filled 2016-06-01 (×2): qty 1

## 2016-06-01 NOTE — ED Notes (Addendum)
Pt to ED by EMS from home c/o hives and dizziness onset at 5pm. Pt had a hamburger at noon, denies allergy to burgers, but son has an allergy. Pt took 50mg  benadryl prior to EMS arrival. Hives noted to bilateral arms, lower back and lower abdomen. EMS reports when pt was moved to stretcher, pt had a near syncopal episode. Lung sounds clear, 0.3mg  epi IM given and 4mg  zofran. Pt denies pain, c/o itching. Pt reports having cortisone injections this week; has an allergy to prednisone (severe tachycardia)

## 2016-06-01 NOTE — H&P (Signed)
History and Physical    [name] [id number] DOB: December 13, 1925 DOA: 06/01/2016  Referring MD/NP/PA:   PCP: Leonard Downing, MD   Patient coming from:  The patient is coming from home.  At baseline, pt is independent for most of ADL.       Chief Complaint: Anaphylaxis reaction  HPI: [name] is a 82 y.o. male with medical history significant of chronic systolic contact card failure with EF of 30%, hypertension, hyperlipidemia, GERD, CAD, myocardial infarction, s/p of stent placement, sinus bradycardia, BPH, IBS, OSA on CPAP, who presents with anaphylactic reaction.   He states that he ate a burger around noon and then around 5 PM, he noticed that he has diffuse hives and itches. He also has dizziness, mild lip and tougue swelling, dizziness, near syncope, but did not pass out. He has nausea and vomited 3 times without blood in the vomitus.Patient denies chest pain, difficult breathing, abdominal pain, diarrhea, symptoms of UTI. No vision change or hearing loss. No unilateral weakness. No fever or chills.   Per EMS, pt's had SBP of 60. He was given one dose of epi, 500 cc bolus, and one dose of Benadryl. His bp improved to 109/59. Per EDP, he is positive for orthostatic status with blood pressure drop to 88/75 when sitting up and comes back to 100/51 when lying down. Patient's mental status is normal, oriented 3. Of note, pt is taking alpha blocker, Terazosin for BPH.  ED Course: pt was found to have  negative troponin, WBC 12.6, temperature normal, bradycardia, mild tachypnea, worsening renal function, and urinalysis. Patient was given 4 mg of Decadron and 1 dose of Pepcid in emergency room. Pt is placed on SDU for observation .  Review of Systems:   General: no fevers, chills, no changes in body weight, has fatigue HEENT: no blurry vision, hearing changes or sore throat Pulm: no dyspnea, coughing, wheezing CV: no chest pain, no palpitations Abd: has nausea, vomiting,  no abdominal pain, diarrhea, constipation GU: no dysuria, burning on urination, increased urinary frequency, hematuria  Ext: no leg edema Neuro: no unilateral weakness, numbness, or tingling, no vision change or hearing loss Skin: has diffused halves. MSK: No muscle spasm, no deformity, no limitation of range of movement in spin Heme: No easy bruising.  Travel history: No recent long distant travel.  Allergy:  Allergies  Allergen Reactions  . Prilosec [Omeprazole]     Diarrhea, stomach cramps  . Tylenol [Acetaminophen]     urticaria  . Prednisone Palpitations    REACTION: causes tachycardia, increase BP    Past Medical History  Diagnosis Date  . Coronary artery disease   . Left hip pain     Chronic left hip discomfort  . Hypercholesterolemia   . Osteoarthritis   . Hypertension   . Diverticulosis   . SVT (supraventricular tachycardia) ([place])   . Tubular adenoma   . BPH (benign prostatic hyperplasia)   . IBS (irritable bowel syndrome)   . Kidney stone   . Myocardial infarction [facility]) 2001    Previous anterolateral myocardial infarction with presistent ST-T wave changes  . GERD (gastroesophageal reflux disease)   . Sleep apnea     uses cpap  . Chronic sinus bradycardia   . Chronic systolic (congestive) heart failure ([place])   . CKD (chronic kidney disease), stage III     Past Surgical History  Procedure Laterality Date  . Cardiac catheterization  05/07/2002    Ejection fraction 20%  . Angioplasty  2002    with stent and angioplasty in 2003  . Hip closed reduction  01/31/2012    Procedure: CLOSED MANIPULATION HIP;  Surgeon: [name], MD;  Location: [facility];  Service: Orthopedics;  Laterality: Right;  closed reduction right dislocated hip  . Appendectomy    . Tonsillectomy    . Hip closed reduction  10/10/2012    Procedure: CLOSED MANIPULATION HIP;  Surgeon: [name], MD;  Location: [facility];  Service: Orthopedics;  Laterality: Right;  . Transurethral resection  of prostate    . Back surgery    . Finger surgery    . Hip closed reduction Right 03/17/2015    Procedure: CLOSED REDUCTION HIP;  Surgeon: Rod Can, MD;  Location: [facility];  Service: Orthopedics;  Laterality: Right;  . Joint replacement      BILATERAL HIP REPLACEMENTS  . Total hip revision Right 09/06/2015    Procedure: RIGHT TOTAL HIP ACETABULAR LINER REVISION (CONSTRAINED LINER);  Surgeon: [name], MD;  Location: [facility];  Service: Orthopedics;  Laterality: Right;    Social History:  reports that he has never smoked. He has never used smokeless tobacco. He reports that he does not drink alcohol or use illicit drugs.  Family History:  Family History  Problem Relation Age of Onset  . Heart attack Father   . Stroke Father   . Stroke Mother   . Alzheimer's disease Mother      Prior to Admission medications   Medication Sig Start Date End Date Taking? Authorizing Provider  aspirin EC 325 MG EC tablet Take 1 tablet (325 mg total) by mouth daily with breakfast. 09/07/15  Yes Amber Cecilio Asper, PA-C  clopidogrel (PLAVIX) 75 MG tablet Take 1 tablet (75 mg total) by mouth daily. 10/10/15  Yes [name], MD  enalapril (VASOTEC) 2.5 MG tablet Take 2.5 mg by mouth daily.   Yes Historical Provider, MD  finasteride (PROSCAR) 5 MG tablet TAKE 1 TABLET DAILY 02/16/16  Yes [name], MD  metoprolol (LOPRESSOR) 50 MG tablet Take one tablet by mouth in the morning and one-half tablet by mouth in the evening Patient taking differently: Take 50 mg by mouth daily.  01/26/16  Yes [name], MD  rosuvastatin (CRESTOR) 5 MG tablet Take 1 tablet (5 mg total) by mouth at bedtime. 10/18/15  Yes [name], MD  terazosin (HYTRIN) 10 MG capsule Take 1 capsule (10 mg total) by mouth at bedtime. 02/09/16  Yes [name], MD  methocarbamol (ROBAXIN) 500 MG tablet Take 1 tablet (500 mg total) by mouth every 6 (six) hours as needed for muscle spasms. Patient not taking: Reported on  06/01/2016 09/07/15   Ardeen Jourdain, PA-C  oxyCODONE (OXY IR/ROXICODONE) 5 MG immediate release tablet Take 1-2 tablets (5-10 mg total) by mouth every 3 (three) hours as needed for breakthrough pain. Patient not taking: Reported on 06/01/2016 09/07/15   Ardeen Jourdain, PA-C  traMADol (ULTRAM) 50 MG tablet Take 1-2 tablets (50-100 mg total) by mouth every 6 (six) hours as needed for moderate pain. Patient not taking: Reported on 06/01/2016 09/07/15   Ardeen Jourdain, PA-C  valsartan-hydrochlorothiazide (DIOVAN-HCT) 160-12.5 MG per tablet Take 1 tablet by mouth daily. Patient not taking: Reported on 06/01/2016 09/11/15   [name], MD    Physical Exam: Filed Vitals:   06/01/16 2215 06/01/16 2230 06/01/16 2245 06/01/16 2300  BP: 92/54 96/54 101/57 88/75  Pulse: 55 55 58 54  Temp:      Resp: 17 17 17  22  SpO2: 94% 94% 97% 97%   General: Not in acute distress HEENT:       Eyes: PERRL, EOMI, no scleral icterus.       ENT: No discharge from the ears and nose, no pharynx injection, no tonsillar enlargement.        Neck: No JVD, no bruit, no mass felt. Heme: No neck lymph node enlargement. Cardiac: S1/S2, RRR, No murmurs, No gallops or rubs. Pulm: No rales, wheezing, rhonchi or rubs. Abd: Soft, nondistended, nontender, no rebound pain, no organomegaly, BS present. GU: No hematuria Ext: No pitting leg edema bilaterally. 2+DP/PT pulse bilaterally. Musculoskeletal: No joint deformities, No joint redness or warmth, no limitation of ROM in spin. Skin: Has diffused hives. Neuro: Alert, oriented X3, cranial nerves II-XII grossly intact, moves all extremities normally. Psych: Patient is not psychotic, no suicidal or hemocidal ideation.  Labs on Admission: I have personally reviewed following labs and imaging studies  CBC:  Recent Labs Lab 06/01/16 2155  WBC 12.6*  NEUTROABS 10.5*  HGB 14.9  HCT 45.7  MCV 90.9  PLT 123XX123   Basic Metabolic Panel:  Recent Labs Lab 06/01/16 2155  NA  137  K 4.8  CL 104  CO2 23  GLUCOSE 160*  BUN 45*  CREATININE 2.17*  CALCIUM 8.7*   GFR: CrCl cannot be calculated (Unknown ideal weight.). Liver Function Tests:  Recent Labs Lab 06/01/16 2155  AST 30  ALT 17  ALKPHOS 92  BILITOT 0.5  PROT 6.0*  ALBUMIN 3.5   No results for input(s): LIPASE, AMYLASE in the last 168 hours. No results for input(s): AMMONIA in the last 168 hours. Coagulation Profile: No results for input(s): INR, PROTIME in the last 168 hours. Cardiac Enzymes: No results for input(s): CKTOTAL, CKMB, CKMBINDEX, TROPONINI in the last 168 hours. BNP (last 3 results) No results for input(s): PROBNP in the last 8760 hours. HbA1C: No results for input(s): HGBA1C in the last 72 hours. CBG: No results for input(s): GLUCAP in the last 168 hours. Lipid Profile: No results for input(s): CHOL, HDL, LDLCALC, TRIG, CHOLHDL, LDLDIRECT in the last 72 hours. Thyroid Function Tests: No results for input(s): TSH, T4TOTAL, FREET4, T3FREE, THYROIDAB in the last 72 hours. Anemia Panel: No results for input(s): VITAMINB12, FOLATE, FERRITIN, TIBC, IRON, RETICCTPCT in the last 72 hours. Urine analysis:    Component Value Date/Time   COLORURINE YELLOW 08/31/2015 0807   APPEARANCEUR CLEAR 08/31/2015 0807   LABSPEC 1.011 08/31/2015 0807   PHURINE 5.0 08/31/2015 0807   GLUCOSEU NEGATIVE 08/31/2015 0807   HGBUR NEGATIVE 08/31/2015 0807   BILIRUBINUR NEGATIVE 08/31/2015 0807   KETONESUR NEGATIVE 08/31/2015 0807   PROTEINUR NEGATIVE 08/31/2015 0807   UROBILINOGEN 0.2 08/31/2015 0807   NITRITE NEGATIVE 08/31/2015 0807   LEUKOCYTESUR NEGATIVE 08/31/2015 0807   Sepsis Labs: @LABRCNTIP (procalcitonin:4,lacticidven:4) )No results found for this or any previous visit (from the past 240 hour(s)).   Radiological Exams on Admission: No results found.   EKG: Independently reviewed, sinus rhythm, QT 422, ST elevation in V2-V6, which is similar to previous EKG on 05/16/15. I asked  cardiologist, Dr. Margaretha Seeds to have reviewed this EKG, she thinks that this is most likely due to abnormal repolarization, not STEMI.   Assessment/Plan Principal Problem:   Anaphylaxis Active Problems:   HLD (hyperlipidemia)   Essential hypertension   Coronary atherosclerosis   GERD   BPH (benign prostatic hyperplasia)   Sinus bradycardia   Chronic systolic (congestive) heart failure (HCC)   Acute renal failure superimposed on  stage 3 chronic kidney disease (HCC)   Anaphylaxis: pt's symptoms are most likely caused by anaphylaxis. Patient has mild swelling in tongue and lips, but no shortness of breath or difficulty breathing. No CP. Airway is protected. Mental status is normal. Blood pressure stabilized currently.   - will place on SDU bed for obs  -  Decadron 4 mg tid, pepcid 20 bid, Benadryl 12.5 tid - IVF: 2L NS, then 100 cc/h   - Hold all BP meds, including beta blocker and Terazosin which is for BPH  Hives: -see above  Chronic systolic (congestive) heart failure ([place]): 2-D echo on 12/04/15 showed EF 30%. No leg edema or JVD. CHF is compensated. Patient is not on loop diuretics at home, but is taking valsantan-HCTZ. -Hold all blood pressure medications due to hypotension -Continue aspirin -check BNP -monitoring volume status and her respiratory function closely while patient is on IV fluids.  Essential hypertension: -Hold all Bp meds as above  HLD: Last LDL was 72 on 01/15/16 -Continue home medications: Crestor  CAD: s/p of stent. No CP. EKG showed ST elevation in V2-V6, which is similar to previous EKG on 05/16/15. I asked cardiologist, Dr. Margaretha Seeds to have reviewed this EKG, she thinks that this is most likely due to abnormal repolarization, not STEMI. She recommended to repeat troponin 1 in AM -continue ASA, plavix and crestor -Troponin 1 in morning  GERD: -on Pepcid  BPH: stable - Continue proscar - hold Terazosin   AoCKD-III: Baseline Cre is 1.4-1.6, pt's Cre  is 2.17, BUN 45 on admission. Likely due to prerenal secondary to dehydration and continuation of ACEI, ARB, diruetics. ATN is also possible due to episode of hypotension. - IVF as above - Check FeUrea - Follow up renal function by BMP - Hold enalapril, Diovan, HCTZ   DVT ppx: SQ Lovenox Code Status: Full code Family Communication: Yes, patient's wife and daughter at bed side Disposition Plan:  Anticipate discharge back to previous home environment Consults called:  none Admission status: SDU/obs  Date of Service 06/02/2016    Ivor Costa [facility] Pager [phone]  If 7PM-7AM, please contact night-coverage [email or web address] Password [email or web address] 06/02/2016, 12:05 AM

## 2016-06-01 NOTE — ED Provider Notes (Addendum)
CSN: [id number]     Arrival date & time 06/01/16  2134 History   First MD Initiated Contact with Patient 06/01/16 2140     Chief Complaint  Patient presents with  . Allergic Reaction     (Consider location/radiation/quality/duration/timing/severity/associated sxs/prior Treatment) The history is provided by the patient.  [name] is a 82 y.o. male hx of CAD with stent, SVT, MI, Who presenting with near syncope, dizziness, hives. He states that he ate a burger around noon and then around 5 PM, he noticed that he has diffuse hives. Also felt dizzy and almost passed out. Denies chest pain or shortness of breath. Denies trouble breathing. Came by EMS and was given epi at 8:50 pm and 500 cc bolus and BP was in the 60s at that time. Denies new medicines. Had steroid shot in the back several days ago.    Past Medical History  Diagnosis Date  . Coronary artery disease   . Left hip pain     Chronic left hip discomfort  . Hypercholesterolemia   . Osteoarthritis   . Hypertension   . Diverticulosis   . SVT (supraventricular tachycardia) ([place])   . Tubular adenoma   . BPH (benign prostatic hyperplasia)   . IBS (irritable bowel syndrome)   . Kidney stone   . Myocardial infarction [facility]) 2001    Previous anterolateral myocardial infarction with presistent ST-T wave changes  . GERD (gastroesophageal reflux disease)   . Sleep apnea     uses cpap  . Chronic sinus bradycardia    Past Surgical History  Procedure Laterality Date  . Cardiac catheterization  05/07/2002    Ejection fraction 20%  . Angioplasty  2002    with stent and angioplasty in 2003  . Hip closed reduction  01/31/2012    Procedure: CLOSED MANIPULATION HIP;  Surgeon: [name], MD;  Location: [facility];  Service: Orthopedics;  Laterality: Right;  closed reduction right dislocated hip  . Appendectomy    . Tonsillectomy    . Hip closed reduction  10/10/2012    Procedure: CLOSED MANIPULATION HIP;  Surgeon: [name],  MD;  Location: [facility];  Service: Orthopedics;  Laterality: Right;  . Transurethral resection of prostate    . Back surgery    . Finger surgery    . Hip closed reduction Right 03/17/2015    Procedure: CLOSED REDUCTION HIP;  Surgeon: Rod Can, MD;  Location: [facility];  Service: Orthopedics;  Laterality: Right;  . Joint replacement      BILATERAL HIP REPLACEMENTS  . Total hip revision Right 09/06/2015    Procedure: RIGHT TOTAL HIP ACETABULAR LINER REVISION (CONSTRAINED LINER);  Surgeon: [name], MD;  Location: [facility];  Service: Orthopedics;  Laterality: Right;   Family History  Problem Relation Age of Onset  . Heart attack Father   . Stroke Father   . Stroke Mother   . Alzheimer's disease Mother    Social History  Substance Use Topics  . Smoking status: Never Smoker   . Smokeless tobacco: Never Used  . Alcohol Use: No    Review of Systems  Skin: Positive for rash.  Neurological: Positive for dizziness.  All other systems reviewed and are negative.     Allergies  Prilosec; Tylenol; and Prednisone  Home Medications   Prior to Admission medications   Medication Sig Start Date End Date Taking? Authorizing Provider  aspirin EC 325 MG EC tablet Take 1 tablet (325 mg total) by mouth daily with  breakfast. 09/07/15  Yes Amber Cecilio Asper, PA-C  clopidogrel (PLAVIX) 75 MG tablet Take 1 tablet (75 mg total) by mouth daily. 10/10/15  Yes [name], MD  enalapril (VASOTEC) 2.5 MG tablet Take 2.5 mg by mouth daily.   Yes Historical Provider, MD  finasteride (PROSCAR) 5 MG tablet TAKE 1 TABLET DAILY 02/16/16  Yes [name], MD  metoprolol (LOPRESSOR) 50 MG tablet Take one tablet by mouth in the morning and one-half tablet by mouth in the evening Patient taking differently: Take 50 mg by mouth daily.  01/26/16  Yes [name], MD  rosuvastatin (CRESTOR) 5 MG tablet Take 1 tablet (5 mg total) by mouth at bedtime. 10/18/15  Yes [name], MD  terazosin (HYTRIN) 10  MG capsule Take 1 capsule (10 mg total) by mouth at bedtime. 02/09/16  Yes [name], MD  methocarbamol (ROBAXIN) 500 MG tablet Take 1 tablet (500 mg total) by mouth every 6 (six) hours as needed for muscle spasms. Patient not taking: Reported on 06/01/2016 09/07/15   Ardeen Jourdain, PA-C  oxyCODONE (OXY IR/ROXICODONE) 5 MG immediate release tablet Take 1-2 tablets (5-10 mg total) by mouth every 3 (three) hours as needed for breakthrough pain. Patient not taking: Reported on 06/01/2016 09/07/15   Ardeen Jourdain, PA-C  traMADol (ULTRAM) 50 MG tablet Take 1-2 tablets (50-100 mg total) by mouth every 6 (six) hours as needed for moderate pain. Patient not taking: Reported on 06/01/2016 09/07/15   Ardeen Jourdain, PA-C  valsartan-hydrochlorothiazide (DIOVAN-HCT) 160-12.5 MG per tablet Take 1 tablet by mouth daily. Patient not taking: Reported on 06/01/2016 09/11/15   [name], MD   BP 88/75 mmHg  Pulse 54  Temp(Src) 97.9 F (36.6 C)  Resp 22  SpO2 97% Physical Exam  Constitutional: He is oriented to person, place, and time. He appears well-developed and well-nourished.  Chronically ill, NAD   HENT:  Head: Normocephalic.  Mouth/Throat: Oropharynx is clear and moist.  Eyes: Conjunctivae are normal. Pupils are equal, round, and reactive to light.  Neck: Normal range of motion. Neck supple.  Cardiovascular: Normal rate, regular rhythm and normal heart sounds.   Pulmonary/Chest: Effort normal and breath sounds normal. No respiratory distress. He has no wheezes. He has no rales.  Abdominal: Soft. Bowel sounds are normal. He exhibits no distension. There is no tenderness. There is no rebound.  Musculoskeletal: Normal range of motion. He exhibits no edema or tenderness.  Neurological: He is alert and oriented to person, place, and time. No cranial nerve deficit. Coordination normal.  Skin:  Hives on lower abdomen and upper legs.   Psychiatric: He has a normal mood and affect. His behavior  is normal. Judgment and thought content normal.  Nursing note and vitals reviewed.   ED Course  Procedures (including critical care time) Labs Review Labs Reviewed  CBC WITH DIFFERENTIAL/PLATELET - Abnormal; Notable for the following:    WBC 12.6 (*)    Neutro Abs 10.5 (*)    All other components within normal limits  COMPREHENSIVE METABOLIC PANEL - Abnormal; Notable for the following:    Glucose, Bld 160 (*)    BUN 45 (*)    Creatinine, Ser 2.17 (*)    Calcium 8.7 (*)    Total Protein 6.0 (*)    GFR calc non Af Amer 25 (*)    GFR calc Af Amer 30 (*)    All other components within normal limits  I-STAT TROPOININ, ED    Imaging Review No results found. I have personally  reviewed and evaluated these images and lab results as part of my medical decision-making.   EKG Interpretation None      ED ECG REPORT I, [name], the attending physician, personally viewed and interpreted this ECG.   Date: 06/01/2016  EKG Time: 21:38  Rate: 54  Rhythm: normal EKG, normal sinus rhythm  Axis: normal  Intervals:none  ST&T Change: nonspecific    MDM   Final diagnoses:  None   [name] is a 82 y.o. male here with hives, dizziness. Patient ate a burger and that happened shortly afterwards. No new meds. Had near syncope but now felt better after Epi IM. Denies chest pain . Will observe for several hours. He has previous allergy to prednisone but has palpitations with it and had recent cortisone shot with no reactions so will give decadron. Took benadryl prior to arrival and will give pepcid.    11:26 PM Labs showed Cr 2.2, elevated compared to baseline. Given 1 L NS bolus. Patient sat by the side of the bed and then felt dizzy and almost passed out again and BP dropped to the 80s. Continues to deny chest pain. Rash improved. Given CAD with stents and continued hypotension and near syncope, will admit for hydration, syncope workup.      Wandra Arthurs, MD 06/01/16  [id number]  Wandra Arthurs, MD 06/01/16 [phone]

## 2016-06-01 NOTE — ED Notes (Signed)
When patient sat up he became nauseous and pale. Bp was positive for orthostatics.

## 2016-06-02 ENCOUNTER — Encounter (HOSPITAL_COMMUNITY): Payer: Self-pay

## 2016-06-02 DIAGNOSIS — N179 Acute kidney failure, unspecified: Secondary | ICD-10-CM

## 2016-06-02 DIAGNOSIS — N183 Chronic kidney disease, stage 3 unspecified: Secondary | ICD-10-CM | POA: Diagnosis present

## 2016-06-02 DIAGNOSIS — T782XXA Anaphylactic shock, unspecified, initial encounter: Secondary | ICD-10-CM | POA: Diagnosis present

## 2016-06-02 DIAGNOSIS — T782XXD Anaphylactic shock, unspecified, subsequent encounter: Secondary | ICD-10-CM

## 2016-06-02 HISTORY — DX: Chronic kidney disease, stage 3 unspecified: N18.30

## 2016-06-02 HISTORY — DX: Anaphylactic shock, unspecified, initial encounter: T78.2XXA

## 2016-06-02 LAB — CBC
HCT: 43.3 % (ref 39.0–52.0)
Hemoglobin: 13.9 g/dL (ref 13.0–17.0)
MCH: 29.2 pg (ref 26.0–34.0)
MCHC: 32.1 g/dL (ref 30.0–36.0)
MCV: 91 fL (ref 78.0–100.0)
PLATELETS: 150 10*3/uL (ref 150–400)
RBC: 4.76 MIL/uL (ref 4.22–5.81)
RDW: 13.7 % (ref 11.5–15.5)
WBC: 11 10*3/uL — AB (ref 4.0–10.5)

## 2016-06-02 LAB — BASIC METABOLIC PANEL
Anion gap: 8 (ref 5–15)
BUN: 42 mg/dL — ABNORMAL HIGH (ref 6–20)
CALCIUM: 8.2 mg/dL — AB (ref 8.9–10.3)
CHLORIDE: 109 mmol/L (ref 101–111)
CO2: 22 mmol/L (ref 22–32)
Creatinine, Ser: 1.88 mg/dL — ABNORMAL HIGH (ref 0.61–1.24)
GFR, EST AFRICAN AMERICAN: 35 mL/min — AB (ref >60)
GFR, EST NON AFRICAN AMERICAN: 30 mL/min — AB (ref >60)
Glucose, Bld: 152 mg/dL — ABNORMAL HIGH (ref 65–99)
Potassium: 4.7 mmol/L (ref 3.5–5.1)
SODIUM: 139 mmol/L (ref 135–145)

## 2016-06-02 LAB — URINALYSIS, ROUTINE W REFLEX MICROSCOPIC
BILIRUBIN URINE: NEGATIVE
GLUCOSE, UA: NEGATIVE mg/dL
HGB URINE DIPSTICK: NEGATIVE
KETONES UR: NEGATIVE mg/dL
Leukocytes, UA: NEGATIVE
Nitrite: NEGATIVE
PH: 5.5 (ref 5.0–8.0)
Protein, ur: NEGATIVE mg/dL
Specific Gravity, Urine: 1.024 (ref 1.005–1.030)

## 2016-06-02 LAB — PROTIME-INR
INR: 1.13 (ref 0.00–1.49)
PROTHROMBIN TIME: 14.7 s (ref 11.6–15.2)

## 2016-06-02 LAB — BRAIN NATRIURETIC PEPTIDE: B Natriuretic Peptide: 185.3 pg/mL — ABNORMAL HIGH (ref 0.0–100.0)

## 2016-06-02 LAB — CREATININE, URINE, RANDOM: CREATININE, URINE: 156.3 mg/dL

## 2016-06-02 LAB — TROPONIN I: TROPONIN I: 0.06 ng/mL — AB (ref <0.031)

## 2016-06-02 MED ORDER — IPRATROPIUM BROMIDE 0.02 % IN SOLN: 0.5000 mg | RESPIRATORY_TRACT | Status: DC | PRN

## 2016-06-02 MED ORDER — SODIUM CHLORIDE 0.9% FLUSH
3.0000 mL | Freq: Two times a day (BID) | INTRAVENOUS | Status: DC
  Administered 2016-06-02 – 2016-06-03 (×4): 3 mL via INTRAVENOUS

## 2016-06-02 MED ORDER — ENOXAPARIN SODIUM 30 MG/0.3ML ~~LOC~~ SOLN
30.0000 mg | SUBCUTANEOUS | Status: DC
  Administered 2016-06-02 – 2016-06-03 (×2): 30 mg via SUBCUTANEOUS
  Filled 2016-06-02 (×3): qty 0.3

## 2016-06-02 NOTE — ED Notes (Signed)
Pt attempted to use urinal while having a bowel movement, pt unable to collect urine at this time; will try again later

## 2016-06-02 NOTE — ED Notes (Signed)
Lunch ordered 

## 2016-06-02 NOTE — ED Notes (Signed)
Heart healthy tray ordered 

## 2016-06-02 NOTE — Progress Notes (Addendum)
PROGRESS NOTE                                                                                                                                                                                                             Patient Demographics:    [name], is a 82 y.o. male, DOB - 1925-05-21, [id number]  Admit date - 06/01/2016   Admitting Physician Louellen Molder, MD  Outpatient Primary MD for the patient is Leonard Downing, MD  [facility] -   [facility]  Chief Complaint  Patient presents with  . Allergic Reaction       Brief Narrative   82 year old male with history of chronic systolic CHF (EF of [id number]), hypertension, hyperlipidemia, GERD, CAD with history of MI status post stent, sinus bradycardia, OSA on CPAP, BPH presented with acute anaphylactic reaction. He was out with his son and had a burger. Few hours later he had diffuse hives with itching, dizziness, mild lip and tongue swelling with near syncopal episode but did not pass out. He has 3 episodes of vomiting. Denied chest pain, difficulty feeding, bowel pain, bowel or urinary symptoms. When EMS arrived he had systolic blood pressure of 60 and was given a dose of epi, fibrinous is normal saline bolus and 1 dose of Benadryl. Blood pressure improved to 109/59. He was orthostatic in the ED. Vitals were otherwise stable for bradycardia in the 50s. Blood work showed WBC of 12.6, acute on chronic kidney injury, unchanged EKG with mildly elevated troponin. Patient started on Decadron, Pepcid and Benadryl and admitted to telemetry.   Subjective:   Patient has mild swelling in his lips but able to swallow without difficulty. Itching has resolved.    Assessment  & Plan :    Principal Problem:   Anaphylaxis No clear etiology. Possibly due to the food he had yesterday (had Jethro Bolus for the first time from that joint). Also had cortisone  injection in his back 3 days prior to admission (cortisone is associated with rare cases of anaphylaxis, are sure if it would cause a delayed reaction ) Continue telemetry monitoring. Did not receive decadron in ED with concern for  allergy to prednisone. Since symptoms have improved will not resume it. Continue Pepcid and  Benadryl. Gentle IV hydration. Hold all blood pressure medications. Patient is also on ARB which I will  discontinue completely . Patient would benefit from seeing an allergist as outpatient.   Chronic systolic (congestive) heart failure ([place]):  2-D echo on 12/04/15 showed EF 30%.  Compensated. Continue to hold blood pressure medications.   Hypotension Holding all blood pressure medications. Stable.  CAD:  Mildly elevated troponin but no chest pain symptoms. EKG unchanged. -continue ASA, plavix and crestor   GERD: -on Pepcid  BPH:  stable - Continue proscar - hold Terazosin   AoCKD-III:  Baseline Cre is 1.4-1.6, hold ARB and diuretics.   Code Status : Full code  Family Communication  : Daughter at bedside  Disposition Plan  :Home tomorrow if continues to improve  Barriers For Discharge : Improving symptoms  Consults  :  None  Procedures  : None  DVT Prophylaxis  :  Lovenox  Lab Results  Component Value Date   PLT 150 06/02/2016    Antibiotics  :   Anti-infectives    None        Objective:   Filed Vitals:   06/02/16 0800 06/02/16 0900 06/02/16 1100 06/02/16 1237  BP: 109/58 119/59 115/65 142/63  Pulse: 55 47 53 55  Temp:    97.8 F (36.6 C)  TempSrc:    Oral  Resp: 17 15 16 18   Height:    5' 6.5" (1.689 m)  Weight:    74.4 kg (164 lb 0.4 oz)  SpO2: 94% 95% 92% 97%    Wt Readings from Last 3 Encounters:  06/02/16 74.4 kg (164 lb 0.4 oz)  01/15/16 73.029 kg (161 lb)  09/06/15 73.936 kg (163 lb)     Intake/Output Summary (Last 24 hours) at 06/02/16 1303 Last data filed at 06/02/16 0542  Gross per 24 hour  Intake   2000 ml    Output    100 ml  Net   1900 ml     Physical Exam  Gen: not in distress HEENT: Mild swelling of the lips, no pallor, moist mucosa, supple neck, swelling of the tongue resolved  Chest: clear b/l, no added sounds CVS: N S1&S2, no murmurs, rubs or gallop GI: soft, NT, ND, BS+ Musculoskeletal: warm, no edema, No rash or urticaria  CNS: AAOX3, non focal    Data Review:    CBC  Recent Labs Lab 06/01/16 2155 06/02/16 0425  WBC 12.6* 11.0*  HGB 14.9 13.9  HCT 45.7 43.3  PLT 177 150  MCV 90.9 91.0  MCH 29.6 29.2  MCHC 32.6 32.1  RDW 13.7 13.7  LYMPHSABS 1.1  --   MONOABS 1.0  --   EOSABS 0.0  --   BASOSABS 0.0  --     Chemistries   Recent Labs Lab 06/01/16 2155 06/02/16 0425  NA 137 139  K 4.8 4.7  CL 104 109  CO2 23 22  GLUCOSE 160* 152*  BUN 45* 42*  CREATININE 2.17* 1.88*  CALCIUM 8.7* 8.2*  AST 30  --   ALT 17  --   ALKPHOS 92  --   BILITOT 0.5  --    ------------------------------------------------------------------------------------------------------------------ No results for input(s): CHOL, HDL, LDLCALC, TRIG, CHOLHDL, LDLDIRECT in the last 72 hours.  No results found for: HGBA1C ------------------------------------------------------------------------------------------------------------------ No results for input(s): TSH, T4TOTAL, T3FREE, THYROIDAB in the last 72 hours.  Invalid input(s): FREET3 ------------------------------------------------------------------------------------------------------------------ No results for input(s): VITAMINB12, FOLATE, FERRITIN, TIBC, IRON, RETICCTPCT in the last 72 hours.  Coagulation profile  Recent Labs Lab 06/01/16 2155  INR 1.13    No results for input(s): DDIMER  in the last 72 hours.  Cardiac Enzymes  Recent Labs Lab 06/02/16 0425  TROPONINI 0.06*   ------------------------------------------------------------------------------------------------------------------    Component Value  Date/Time   BNP 185.3* 06/01/2016 2345    Inpatient Medications  Scheduled Meds: . sodium chloride   Intravenous STAT  . aspirin EC  325 mg Oral Q breakfast  . clopidogrel  75 mg Oral Daily  . dexamethasone  4 mg Intravenous TID  . diphenhydrAMINE  12.5 mg Intravenous Q8H  . enoxaparin (LOVENOX) injection  30 mg Subcutaneous Q24H  . famotidine (PEPCID) IV  20 mg Intravenous Q12H  . finasteride  5 mg Oral Daily  . rosuvastatin  5 mg Oral QHS  . sodium chloride flush  3 mL Intravenous Q12H   Continuous Infusions: . sodium chloride Stopped (06/02/16 0331)   PRN Meds:.ipratropium, ondansetron  Micro Results No results found for this or any previous visit (from the past 240 hour(s)).  Radiology Reports No results found.  Time Spent in minutes  25   Louellen Molder M.D on 06/02/2016 at 1:03 PM  Between 7am to 7pm - Pager - [phone]  After 7pm go to [email or web address] - password [facility]  [facility] -  Office  [phone]

## 2016-06-03 DIAGNOSIS — I5022 Chronic systolic (congestive) heart failure: Secondary | ICD-10-CM | POA: Diagnosis not present

## 2016-06-03 DIAGNOSIS — N179 Acute kidney failure, unspecified: Secondary | ICD-10-CM | POA: Diagnosis not present

## 2016-06-03 DIAGNOSIS — R001 Bradycardia, unspecified: Secondary | ICD-10-CM

## 2016-06-03 DIAGNOSIS — T782XXA Anaphylactic shock, unspecified, initial encounter: Secondary | ICD-10-CM | POA: Diagnosis not present

## 2016-06-03 DIAGNOSIS — T782XXD Anaphylactic shock, unspecified, subsequent encounter: Secondary | ICD-10-CM | POA: Diagnosis not present

## 2016-06-03 LAB — BASIC METABOLIC PANEL
ANION GAP: 5 (ref 5–15)
BUN: 41 mg/dL — ABNORMAL HIGH (ref 6–20)
CALCIUM: 8.5 mg/dL — AB (ref 8.9–10.3)
CO2: 23 mmol/L (ref 22–32)
Chloride: 111 mmol/L (ref 101–111)
Creatinine, Ser: 1.71 mg/dL — ABNORMAL HIGH (ref 0.61–1.24)
GFR, EST AFRICAN AMERICAN: 39 mL/min — AB (ref >60)
GFR, EST NON AFRICAN AMERICAN: 34 mL/min — AB (ref >60)
Glucose, Bld: 83 mg/dL (ref 65–99)
POTASSIUM: 4.7 mmol/L (ref 3.5–5.1)
SODIUM: 139 mmol/L (ref 135–145)

## 2016-06-03 LAB — UREA NITROGEN, URINE: UREA NITROGEN UR: 1174 mg/dL

## 2016-06-03 MED ORDER — DIPHENHYDRAMINE HCL 25 MG PO TABS: 12.5000 mg | ORAL_TABLET | Freq: Three times a day (TID) | ORAL | Status: DC | PRN

## 2016-06-03 MED ORDER — FAMOTIDINE 20 MG PO TABS: 20.0000 mg | ORAL_TABLET | Freq: Two times a day (BID) | ORAL | Status: DC

## 2016-06-03 MED ORDER — METOPROLOL SUCCINATE ER 50 MG PO TB24
50.0000 mg | ORAL_TABLET | Freq: Every day | ORAL | Status: DC
  Administered 2016-06-03: 50 mg via ORAL
  Filled 2016-06-03 (×3): qty 1

## 2016-06-03 NOTE — Evaluation (Signed)
Physical Therapy Evaluation Patient Details Name: [name] MRN: [id number] DOB: 1925-01-20 Today's Date: 06/03/2016   History of Present Illness  82 year old male with history of chronic systolic CHF (EF of [id number]), hypertension, hyperlipidemia, GERD, CAD with history of MI status post stent, sinus bradycardia, OSA on CPAP, BPH presented with acute anaphylactic reaction. He was out with his son and had a burger. Few hours later he had diffuse hives with itching, dizziness, mild lip and tongue swelling with near syncopal episode.   Clinical Impression  Pt admitted with above diagnosis. Pt currently with functional limitations due to the deficits listed below (see PT Problem List).  Pt will benefit from skilled PT to increase their independence and safety with mobility to allow discharge to home with family support. Pt reports feeling confident with mobility and going home, denies any questions or concerns.       Follow Up Recommendations No PT follow up;Supervision - Intermittent    Equipment Recommendations  None recommended by PT    Recommendations for Other Services       Precautions / Restrictions Restrictions Weight Bearing Restrictions: No      Mobility  Bed Mobility Overal bed mobility: Independent             General bed mobility comments: HOB elevated approx 20 degrees.   Transfers Overall transfer level: Independent Equipment used: None             General transfer comment: safe technique, no loss of balance  Ambulation/Gait Ambulation/Gait assistance: Supervision Ambulation Distance (Feet): 200 Feet Assistive device: None Gait Pattern/deviations: Step-through pattern;Decreased step length - right;Decreased step length - left Gait velocity: mild decrease   General Gait Details: steady pattern, no instability or loss of balance  Stairs Stairs: Yes Stairs assistance: Min guard Stair Management: Forwards;One rail Right;Alternating pattern Number  of Stairs: 8 General stair comments: steady pattern, increased time used for descending for safety .   Wheelchair Mobility    Modified Rankin (Stroke Patients Only)       Balance Overall balance assessment: No apparent balance deficits (not formally assessed)                                           Pertinent Vitals/Pain Pain Assessment: No/denies pain    Home Living Family/patient expects to be discharged to:: Private residence Living Arrangements: Spouse/significant other Available Help at Discharge: Family;Available 24 hours/day Type of Home: House Home Access: Stairs to enter Entrance Stairs-Rails: Right Entrance Stairs-Number of Steps: 4 Home Layout: Able to live on main level with bedroom/bathroom Home Equipment: Cane - single point      Prior Function Level of Independence: Independent               Hand Dominance        Extremity/Trunk Assessment   Upper Extremity Assessment: Overall WFL for tasks assessed           Lower Extremity Assessment: Overall WFL for tasks assessed         Communication   Communication: HOH  Cognition Arousal/Alertness: Awake/alert Behavior During Therapy: WFL for tasks assessed/performed Overall Cognitive Status: Within Functional Limits for tasks assessed                      General Comments General comments (skin integrity, edema, etc.): Pt reports that he was feeling like his  heart was racing after session, HR elevating to 160s, nursing aware and addressing.     Exercises        Assessment/Plan    PT Assessment Patient needs continued PT services  PT Diagnosis Difficulty walking   PT Problem List Decreased activity tolerance;Decreased mobility  PT Treatment Interventions Gait training;Stair training;Functional mobility training;Therapeutic activities;Therapeutic exercise   PT Goals (Current goals can be found in the Care Plan section) Acute Rehab PT Goals Patient Stated  Goal: to go home PT Goal Formulation: With patient Time For Goal Achievement: 06/10/16 Potential to Achieve Goals: Good    Frequency Min 3X/week   Barriers to discharge        Co-evaluation               End of Session Equipment Utilized During Treatment: Gait belt Activity Tolerance: Patient tolerated treatment well Patient left: in chair;with call bell/phone within reach Nurse Communication: Mobility status    Functional Assessment Tool Used: clinical judgment Functional Limitation: Mobility: Walking and moving around Mobility: Walking and Moving Around Current Status [phone]): At least 20 percent but less than 40 percent impaired, limited or restricted Mobility: Walking and Moving Around Goal Status [phone]): At least 1 percent but less than 20 percent impaired, limited or restricted    Time: 0823-0844 PT Time Calculation (min) (ACUTE ONLY): 21 min   Charges:   PT Evaluation $PT Eval Moderate Complexity: 1 Procedure     PT G Codes:   PT G-Codes **NOT FOR INPATIENT CLASS** Functional Assessment Tool Used: clinical judgment Functional Limitation: Mobility: Walking and moving around Mobility: Walking and Moving Around Current Status [phone]): At least 20 percent but less than 40 percent impaired, limited or restricted Mobility: Walking and Moving Around Goal Status [phone]): At least 1 percent but less than 20 percent impaired, limited or restricted    Cassell Clement, PT, [place] Pager [phone] Office [phone]  06/03/2016, 8:55 AM

## 2016-06-03 NOTE — Progress Notes (Signed)
OT Cancellation Note  Patient Details Name: TALIEK KUPFER MRN: [id number] DOB: 06-05-1925   Cancelled Treatment:    Reason Eval/Treat Not Completed: OT screened, no needs identified, will sign off. Spoke with pt and he has no OT concerns.   Benito Mccreedy OTR/L [id number]  06/03/2016, 1:48 PM

## 2016-06-03 NOTE — Discharge Instructions (Signed)

## 2016-06-03 NOTE — Progress Notes (Signed)
Pt has orders to be discharged. Discharge instructions given and pt has no additional questions at this time. Medication regimen reviewed and pt educated. Pt verbalized understanding and has no additional questions. Telemetry box removed. IV removed and site in good condition. Pt stable and waiting for transportation.   Ivionna Verley RN 

## 2016-06-03 NOTE — Care Management Obs Status (Signed)
[facility] NOTIFICATION   Patient Details  Name: [name] MRN: [id number] Date of Birth: 1925/10/08   Medicare Observation Status Notification Given:  Yes    Dewayne Jurek T, RN 06/03/2016, 11:30 AM

## 2016-06-03 NOTE — Discharge Summary (Signed)
Physician Discharge Summary  [name] [id number] DOB: [birth year] DOA: 06/01/2016  PCP: Leonard Downing, MD  Admit date: 06/01/2016 Discharge date: 06/03/2016  Admitted From: home Disposition:  home  Recommendations for Outpatient Follow-up:  1. Follow up with PCP in 1-2 weeks 2. Patient is on both ACE inhibitor and ARB which I have discontinued (due to anaphylactic reaction of unclear etiology and acute on chronic kidney injury).   Home Health: no Equipment/Devices: no  Discharge Condition: stable CODE STATUS: Full code Diet recommendation: Heart Healthy   Discharge Diagnoses:  Principal Problem:   Anaphylactic reaction   Active Problems:   Acute renal failure superimposed on stage 3 chronic kidney disease (HCC)   HLD (hyperlipidemia)   Essential hypertension   Coronary atherosclerosis   GERD   BPH (benign prostatic hyperplasia)   Sinus bradycardia   Chronic systolic (congestive) heart failure ([place])  Brief Narrative  82 year old male with history of chronic systolic CHF (EF of [id number]), hypertension, hyperlipidemia, GERD, CAD with history of MI status post stent, sinus bradycardia, OSA on CPAP, BPH presented with acute anaphylactic reaction. He was out with his son and had a burger. Few hours later he had diffuse hives with itching, dizziness, mild lip and tongue swelling with near syncopal episode but did not pass out. He has 3 episodes of vomiting. Denied chest pain, difficulty feeding, bowel pain, bowel or urinary symptoms. When EMS arrived he had systolic blood pressure of 60 and was given a dose of epi, fibrinous is normal saline bolus and 1 dose of Benadryl. Blood pressure improved to 109/59. He was orthostatic in the ED. Vitals were otherwise stable for bradycardia in the 50s. Blood work showed WBC of 12.6, acute on chronic kidney injury, unchanged EKG with mildly elevated troponin. Patient started on Decadron, Pepcid and Benadryl and admitted to  telemetry.  Principal Problem: Anaphylactic reaction. No clear etiology. Possibly due to the food he had yesterday (had Jethro Bolus for the first time from that joint). Also had cortisone injection in his back 3 days prior to admission (cortisone is associated with rare cases of anaphylaxis, are sure if it would cause a delayed reaction ) Stable on telemetry except for episodes of asymptomatic bradycardia in the 40s. Did not receive decadron in ED with concern for allergy to prednisone.  Given hydration, placed on Benadryl and Pepcid. Asymptomatic now, lip swelling and urticaria  has resolved.  Patient is on both ACEi and ARB which I have discontinued for now (both due to anaphylaxis and acute kidney injury). He showed follow with his PCP and his cardiologist before ARB can be safely resumed . Patient would benefit from seeing an allergist as outpatient.   Chronic systolic (congestive) heart failure ([place]): 2-D echo on 12/04/15 showed EF 30%.  Compensated. Resume metoprolol. Will hold ARB given acute anaphylaxis and should be evaluated by patient's PCP and cardiologist deem safe to resume it.  Hypotension All blood pressure medications held on admission. The patient now stable and patient tachycardic. Resume metoprolol.  CAD:  Mildly elevated troponin but no chest pain symptoms. EKG unchanged. -continue ASA, plavix and crestor    BPH:  stable - Continue proscar and terazosin.  AoCKD-III: Baseline Cre is 1.4-1.6, holding ARB and HCTZ upon discharge.  Asymptomatic bradycardia Heart rate in 40s when asleep  Patient seen by PT and no follow-up needed. Stable to be discharged home.  Family Communication : None at bedside    Consults : None  Procedures : None    Discharge  Instructions     Medication List    STOP taking these medications        enalapril 2.5 MG tablet  Commonly known as:  VASOTEC     methocarbamol 500 MG tablet  Commonly known as:  ROBAXIN      traMADol 50 MG tablet  Commonly known as:  ULTRAM     valsartan-hydrochlorothiazide 160-12.5 MG tablet  Commonly known as:  DIOVAN-HCT      TAKE these medications        aspirin 325 MG EC tablet  Take 1 tablet (325 mg total) by mouth daily with breakfast.     clopidogrel 75 MG tablet  Commonly known as:  PLAVIX  Take 1 tablet (75 mg total) by mouth daily.     diphenhydrAMINE 25 MG tablet  Commonly known as:  BENADRYL  Take 0.5 tablets (12.5 mg total) by mouth every 8 (eight) hours as needed for itching or allergies.     famotidine 20 MG tablet  Commonly known as:  PEPCID  Take 1 tablet (20 mg total) by mouth 2 (two) times daily.     finasteride 5 MG tablet  Commonly known as:  PROSCAR  TAKE 1 TABLET DAILY     metoprolol 50 MG tablet  Commonly known as:  LOPRESSOR  Take one tablet by mouth in the morning and one-half tablet by mouth in the evening     oxyCODONE 5 MG immediate release tablet  Commonly known as:  Oxy IR/ROXICODONE  Take 1-2 tablets (5-10 mg total) by mouth every 3 (three) hours as needed for breakthrough pain.     rosuvastatin 5 MG tablet  Commonly known as:  CRESTOR  Take 1 tablet (5 mg total) by mouth at bedtime.     terazosin 10 MG capsule  Commonly known as:  HYTRIN  Take 1 capsule (10 mg total) by mouth at bedtime.           Follow-up Information    Follow up with Leonard Downing, MD. Schedule an appointment as soon as possible for a visit in 1 week.   Specialty:  Family Medicine   Contact information:   [place] [address] [phone]      Allergies  Allergen Reactions  . Prilosec [Omeprazole]     Diarrhea, stomach cramps  . Tylenol [Acetaminophen]     urticaria  . Prednisone Palpitations    REACTION: causes tachycardia, increase BP       Procedures/Studies: none    Discharge Exam: Filed Vitals:   06/03/16 0535 06/03/16 1123  BP: 152/55 129/67  Pulse: 42 48  Temp: 97.7 F (36.5 C)  98.8 F (37.1 C)  Resp: 16 17   Filed Vitals:   06/02/16 2018 06/03/16 0006 06/03/16 0535 06/03/16 1123  BP: 131/56 133/63 152/55 129/67  Pulse: 53 48 42 48  Temp: 98.4 F (36.9 C) 98.1 F (36.7 C) 97.7 F (36.5 C) 98.8 F (37.1 C)  TempSrc: Oral Oral Oral Oral  Resp: 18 16 16 17   Height:      Weight:      SpO2: 96% 94% 95% 96%     Gen: not in distress HEENT: Mild swelling of the lips, no pallor, moist mucosa, supple neck, swelling of the tongue and lips  resolved  Chest: clear b/l, no added sounds CVS: N S1&S2, no murmurs, rubs or gallop GI: soft, NT, ND, BS+ Musculoskeletal: warm, no edema, No rash or urticaria  CNS: AAOX3, non focal  The results of significant diagnostics from this hospitalization (including imaging, microbiology, ancillary and laboratory) are listed below for reference.     Microbiology: No results found for this or any previous visit (from the past 240 hour(s)).   Labs: BNP (last 3 results)  Recent Labs  06/01/16 2345  BNP 123XX123*   Basic Metabolic Panel:  Recent Labs Lab 06/01/16 2155 06/02/16 0425 06/03/16 0325  NA 137 139 139  K 4.8 4.7 4.7  CL 104 109 111  CO2 23 22 23   GLUCOSE 160* 152* 83  BUN 45* 42* 41*  CREATININE 2.17* 1.88* 1.71*  CALCIUM 8.7* 8.2* 8.5*   Liver Function Tests:  Recent Labs Lab 06/01/16 2155  AST 30  ALT 17  ALKPHOS 92  BILITOT 0.5  PROT 6.0*  ALBUMIN 3.5   No results for input(s): LIPASE, AMYLASE in the last 168 hours. No results for input(s): AMMONIA in the last 168 hours. CBC:  Recent Labs Lab 06/01/16 2155 06/02/16 0425  WBC 12.6* 11.0*  NEUTROABS 10.5*  --   HGB 14.9 13.9  HCT 45.7 43.3  MCV 90.9 91.0  PLT 177 150   Cardiac Enzymes:  Recent Labs Lab 06/02/16 0425  TROPONINI 0.06*   BNP: Invalid input(s): POCBNP CBG: No results for input(s): GLUCAP in the last 168 hours. D-Dimer No results for input(s): DDIMER in the last 72 hours. Hgb A1c No results for input(s):  HGBA1C in the last 72 hours. Lipid Profile No results for input(s): CHOL, HDL, LDLCALC, TRIG, CHOLHDL, LDLDIRECT in the last 72 hours. Thyroid function studies No results for input(s): TSH, T4TOTAL, T3FREE, THYROIDAB in the last 72 hours.  Invalid input(s): FREET3 Anemia work up No results for input(s): VITAMINB12, FOLATE, FERRITIN, TIBC, IRON, RETICCTPCT in the last 72 hours. Urinalysis    Component Value Date/Time   COLORURINE YELLOW 06/02/2016 [place] 06/02/2016 0545   LABSPEC 1.024 06/02/2016 0545   PHURINE 5.5 06/02/2016 0545   GLUCOSEU NEGATIVE 06/02/2016 0545   HGBUR NEGATIVE 06/02/2016 0545   BILIRUBINUR NEGATIVE 06/02/2016 0545   KETONESUR NEGATIVE 06/02/2016 0545   PROTEINUR NEGATIVE 06/02/2016 0545   UROBILINOGEN 0.2 08/31/2015 0807   NITRITE NEGATIVE 06/02/2016 0545   LEUKOCYTESUR NEGATIVE 06/02/2016 0545   Sepsis Labs Invalid input(s): PROCALCITONIN,  WBC,  LACTICIDVEN Microbiology No results found for this or any previous visit (from the past 240 hour(s)).   Time coordinating discharge: <30 minutes  SIGNED:   Louellen Molder, MD  [facility] 06/03/2016, 2:49 PM Pager   If 7PM-7AM, please contact night-coverage [email or web address] Password [email or web address]

## 2016-08-03 IMAGING — DX DG PORTABLE PELVIS
1 series · 1 of 1 positions shown · non-contrast
Comparison: Radiographs 03/17/2015.

CLINICAL DATA: Postop right hip surgery.  Initial encounter.

EXAM:
PORTABLE PELVIS 1-2 VIEWS

[pelvis ap]
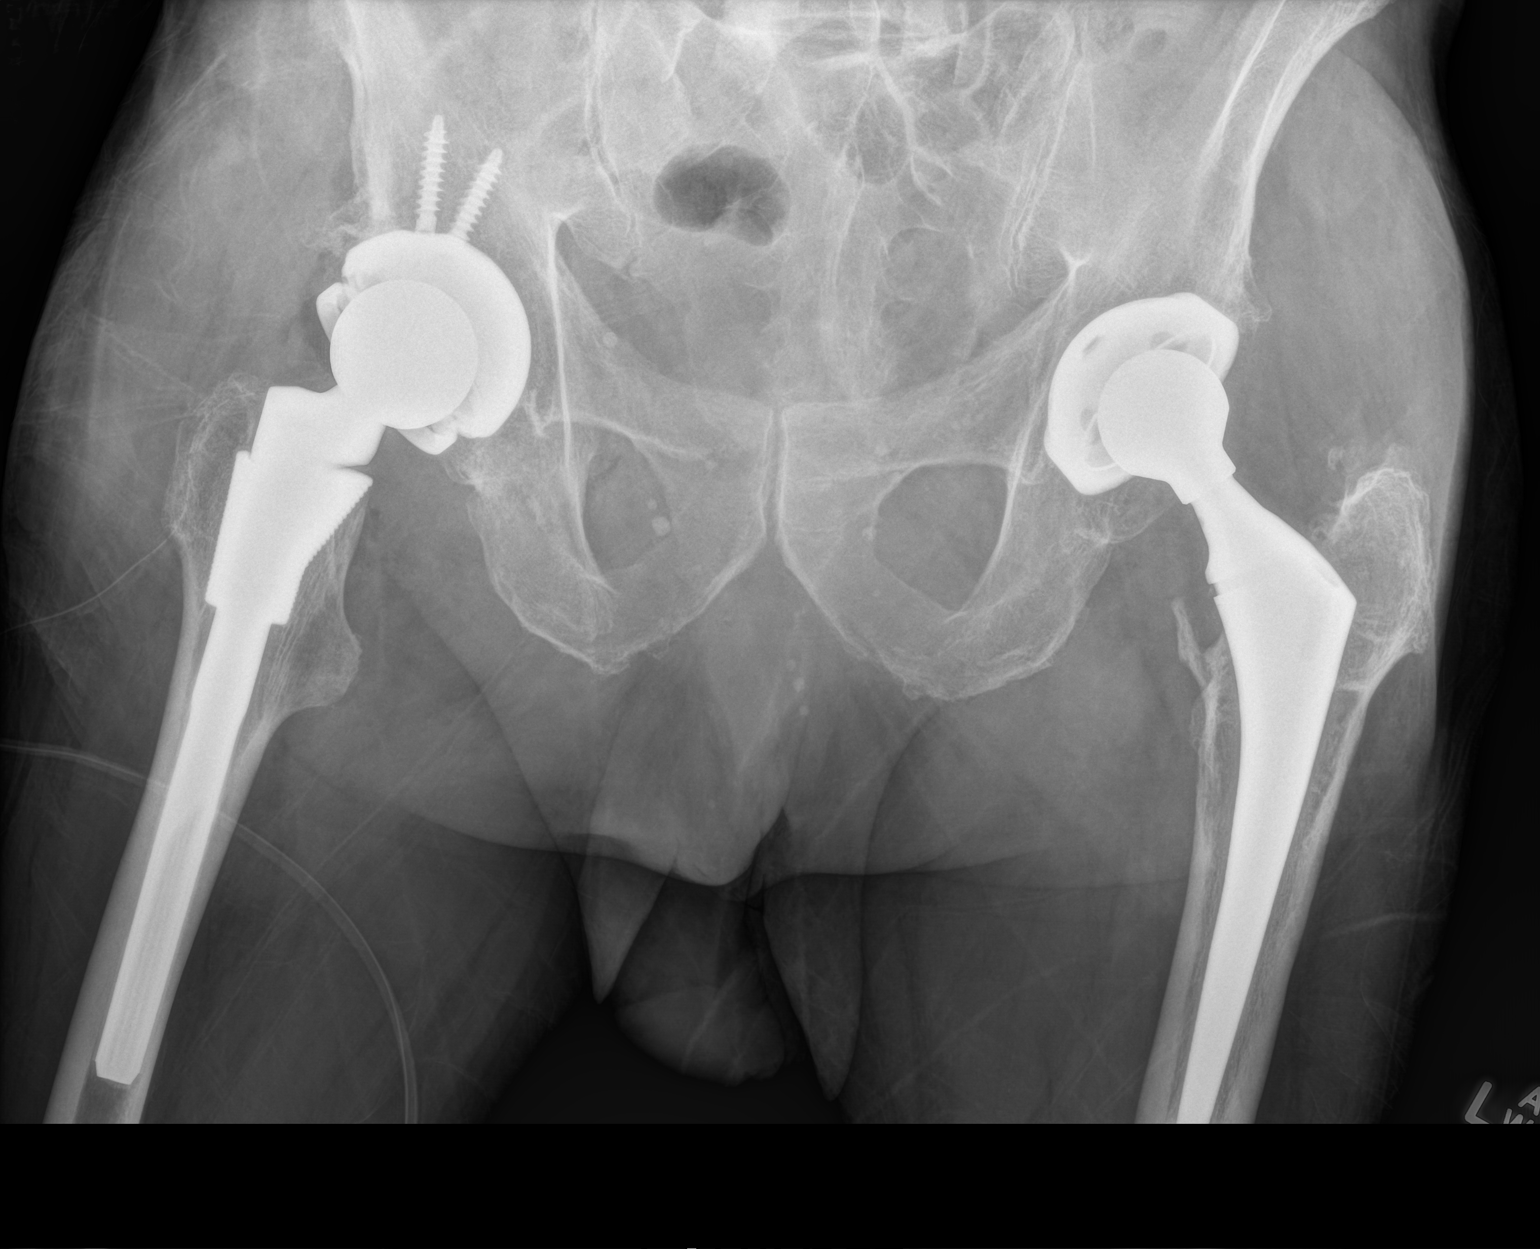

[1 of 1 positions shown; findings below may reference images not displayed]

FINDINGS: 0801 hours. Interval right acetabular liner revision. The screw
fixed acetabular component appears unchanged. The femoral prosthesis
appears unchanged. No evidence of acute fracture or dislocation.
Surgical drain in place. Previous left total hip arthroplasty
appears stable.
IMPRESSION: No complication following right acetabular liner revision.

## 2016-08-06 DIAGNOSIS — D171 Benign lipomatous neoplasm of skin and subcutaneous tissue of trunk: Secondary | ICD-10-CM

## 2016-08-06 HISTORY — DX: Benign lipomatous neoplasm of skin and subcutaneous tissue of trunk: D17.1

## 2016-08-07 DIAGNOSIS — G5603 Carpal tunnel syndrome, bilateral upper limbs: Secondary | ICD-10-CM

## 2016-08-07 DIAGNOSIS — M180 Bilateral primary osteoarthritis of first carpometacarpal joints: Secondary | ICD-10-CM

## 2016-08-07 DIAGNOSIS — M19042 Primary osteoarthritis, left hand: Secondary | ICD-10-CM

## 2016-08-07 DIAGNOSIS — M19041 Primary osteoarthritis, right hand: Secondary | ICD-10-CM

## 2016-08-07 HISTORY — DX: Bilateral primary osteoarthritis of first carpometacarpal joints: M18.0

## 2016-08-07 HISTORY — DX: Primary osteoarthritis, right hand: M19.041

## 2016-08-07 HISTORY — DX: Carpal tunnel syndrome, bilateral upper limbs: G56.03

## 2016-08-26 ENCOUNTER — Other Ambulatory Visit: Payer: Self-pay | Admitting: Orthopedic Surgery

## 2016-08-26 ENCOUNTER — Encounter (HOSPITAL_BASED_OUTPATIENT_CLINIC_OR_DEPARTMENT_OTHER): Payer: Self-pay | Admitting: *Deleted

## 2016-08-26 NOTE — Progress Notes (Signed)
Chart reviewed by Dr Veatrice Kells, [place] for [facility], posted IVR for CTR.

## 2016-08-28 ENCOUNTER — Encounter (HOSPITAL_BASED_OUTPATIENT_CLINIC_OR_DEPARTMENT_OTHER)
Admission: RE | Admit: 2016-08-28 | Discharge: 2016-08-28 | Disposition: A | Discharge status: Home / Self Care | Payer: Medicare Other | Source: Ambulatory Visit | Attending: Orthopedic Surgery | Admitting: Orthopedic Surgery

## 2016-08-28 DIAGNOSIS — M19032 Primary osteoarthritis, left wrist: Secondary | ICD-10-CM | POA: Diagnosis not present

## 2016-08-28 DIAGNOSIS — M1990 Unspecified osteoarthritis, unspecified site: Secondary | ICD-10-CM | POA: Diagnosis not present

## 2016-08-28 DIAGNOSIS — I130 Hypertensive heart and chronic kidney disease with heart failure and stage 1 through stage 4 chronic kidney disease, or unspecified chronic kidney disease: Secondary | ICD-10-CM | POA: Diagnosis not present

## 2016-08-28 DIAGNOSIS — I5022 Chronic systolic (congestive) heart failure: Secondary | ICD-10-CM | POA: Diagnosis not present

## 2016-08-28 DIAGNOSIS — Z955 Presence of coronary angioplasty implant and graft: Secondary | ICD-10-CM | POA: Diagnosis not present

## 2016-08-28 DIAGNOSIS — Z7902 Long term (current) use of antithrombotics/antiplatelets: Secondary | ICD-10-CM | POA: Diagnosis not present

## 2016-08-28 DIAGNOSIS — I252 Old myocardial infarction: Secondary | ICD-10-CM | POA: Diagnosis not present

## 2016-08-28 DIAGNOSIS — M19031 Primary osteoarthritis, right wrist: Secondary | ICD-10-CM | POA: Diagnosis not present

## 2016-08-28 DIAGNOSIS — N189 Chronic kidney disease, unspecified: Secondary | ICD-10-CM | POA: Diagnosis not present

## 2016-08-28 DIAGNOSIS — K219 Gastro-esophageal reflux disease without esophagitis: Secondary | ICD-10-CM | POA: Diagnosis not present

## 2016-08-28 DIAGNOSIS — I2510 Atherosclerotic heart disease of native coronary artery without angina pectoris: Secondary | ICD-10-CM | POA: Diagnosis not present

## 2016-08-28 DIAGNOSIS — M19042 Primary osteoarthritis, left hand: Secondary | ICD-10-CM | POA: Diagnosis not present

## 2016-08-28 DIAGNOSIS — N400 Enlarged prostate without lower urinary tract symptoms: Secondary | ICD-10-CM | POA: Diagnosis not present

## 2016-08-28 DIAGNOSIS — M19041 Primary osteoarthritis, right hand: Secondary | ICD-10-CM | POA: Diagnosis not present

## 2016-08-28 DIAGNOSIS — G5602 Carpal tunnel syndrome, left upper limb: Secondary | ICD-10-CM | POA: Diagnosis not present

## 2016-08-28 DIAGNOSIS — G5603 Carpal tunnel syndrome, bilateral upper limbs: Secondary | ICD-10-CM | POA: Diagnosis not present

## 2016-08-28 DIAGNOSIS — G4730 Sleep apnea, unspecified: Secondary | ICD-10-CM | POA: Diagnosis not present

## 2016-08-28 LAB — BASIC METABOLIC PANEL
Anion gap: 5 (ref 5–15)
BUN: 29 mg/dL — AB (ref 6–20)
CHLORIDE: 109 mmol/L (ref 101–111)
CO2: 27 mmol/L (ref 22–32)
CREATININE: 1.65 mg/dL — AB (ref 0.61–1.24)
Calcium: 9.3 mg/dL (ref 8.9–10.3)
GFR, EST AFRICAN AMERICAN: 41 mL/min — AB (ref >60)
GFR, EST NON AFRICAN AMERICAN: 35 mL/min — AB (ref >60)
Glucose, Bld: 113 mg/dL — ABNORMAL HIGH (ref 65–99)
POTASSIUM: 5.1 mmol/L (ref 3.5–5.1)
SODIUM: 141 mmol/L (ref 135–145)

## 2016-08-28 NOTE — Anesthesia Preprocedure Evaluation (Addendum)
Anesthesia Evaluation  Patient identified by MRN, date of birth, ID band Patient awake    Reviewed: Allergy & Precautions, NPO status , Patient's Chart, lab work & pertinent test results, reviewed documented beta blocker date and time   History of Anesthesia Complications Negative for: history of anesthetic complications  Airway Mallampati: III  TM Distance: >3 FB Neck ROM: Full    Dental  (+) Dental Advisory Given,    Pulmonary neg shortness of breath, sleep apnea and Continuous Positive Airway Pressure Ventilation , neg COPD, neg recent URI,    Pulmonary exam normal breath sounds clear to auscultation       Cardiovascular hypertension, Pt. on medications and Pt. on home beta blockers (-) angina+ CAD, + Past MI (2001), + Cardiac Stents (on aspirin and Plavix, in 2002) and +CHF (EF 30%)  (-) Orthopnea + dysrhythmias (h/o SVT, sinus bradycardia) (-) pacemaker(-) Cardiac Defibrillator  Rhythm:Regular Rate:Normal  HLD  TTE 11/2015: LVH with anteroapical and septal akinesis LVEF 30% Moderate LAE, mild RAE Mild to moderate AI Mild to moderate MR Moderate TR with moderate pulmonary hypertension Moderate PI   Neuro/Psych    GI/Hepatic Neg liver ROS, GERD  Controlled,IBS, diverticulosis   Endo/Other  negative endocrine ROS  Renal/GU CRFRenal disease (h/o nephrolithiasis)     Musculoskeletal  (+) Arthritis , Osteoarthritis,    Abdominal   Peds  Hematology negative hematology ROS (+)   Anesthesia Other Findings   Reproductive/Obstetrics                            Anesthesia Physical Anesthesia Plan  ASA: III  Anesthesia Plan: Bier Block   Post-op Pain Management:    Induction: Intravenous  Airway Management Planned: Natural Airway and Simple Face Mask  Additional Equipment:   Intra-op Plan:   Post-operative Plan:   Informed Consent: I have reviewed the patients History and  Physical, chart, labs and discussed the procedure including the risks, benefits and alternatives for the proposed anesthesia with the patient or authorized representative who has indicated his/her understanding and acceptance.   Dental advisory given  Plan Discussed with: CRNA  Anesthesia Plan Comments:        Anesthesia Quick Evaluation

## 2016-08-29 ENCOUNTER — Encounter (HOSPITAL_BASED_OUTPATIENT_CLINIC_OR_DEPARTMENT_OTHER): Payer: Self-pay | Admitting: Anesthesiology

## 2016-08-29 ENCOUNTER — Ambulatory Visit (HOSPITAL_BASED_OUTPATIENT_CLINIC_OR_DEPARTMENT_OTHER): Payer: Medicare Other | Admitting: Anesthesiology

## 2016-08-29 ENCOUNTER — Ambulatory Visit (HOSPITAL_BASED_OUTPATIENT_CLINIC_OR_DEPARTMENT_OTHER)
Admission: RE | Admit: 2016-08-29 | Discharge: 2016-08-29 | Disposition: A | Discharge status: Home / Self Care | Payer: Medicare Other | Source: Ambulatory Visit | Attending: Orthopedic Surgery | Admitting: Orthopedic Surgery

## 2016-08-29 ENCOUNTER — Encounter (HOSPITAL_BASED_OUTPATIENT_CLINIC_OR_DEPARTMENT_OTHER)
Admission: RE | Disposition: A | Discharge status: Home / Self Care | Payer: Self-pay | Source: Ambulatory Visit | Attending: Orthopedic Surgery

## 2016-08-29 DIAGNOSIS — M19042 Primary osteoarthritis, left hand: Secondary | ICD-10-CM | POA: Insufficient documentation

## 2016-08-29 DIAGNOSIS — M1990 Unspecified osteoarthritis, unspecified site: Secondary | ICD-10-CM | POA: Insufficient documentation

## 2016-08-29 DIAGNOSIS — I130 Hypertensive heart and chronic kidney disease with heart failure and stage 1 through stage 4 chronic kidney disease, or unspecified chronic kidney disease: Secondary | ICD-10-CM | POA: Insufficient documentation

## 2016-08-29 DIAGNOSIS — M19032 Primary osteoarthritis, left wrist: Secondary | ICD-10-CM | POA: Insufficient documentation

## 2016-08-29 DIAGNOSIS — Z955 Presence of coronary angioplasty implant and graft: Secondary | ICD-10-CM | POA: Insufficient documentation

## 2016-08-29 DIAGNOSIS — G5602 Carpal tunnel syndrome, left upper limb: Secondary | ICD-10-CM | POA: Insufficient documentation

## 2016-08-29 DIAGNOSIS — G5603 Carpal tunnel syndrome, bilateral upper limbs: Secondary | ICD-10-CM | POA: Insufficient documentation

## 2016-08-29 DIAGNOSIS — N400 Enlarged prostate without lower urinary tract symptoms: Secondary | ICD-10-CM | POA: Insufficient documentation

## 2016-08-29 DIAGNOSIS — Z7902 Long term (current) use of antithrombotics/antiplatelets: Secondary | ICD-10-CM | POA: Insufficient documentation

## 2016-08-29 DIAGNOSIS — I252 Old myocardial infarction: Secondary | ICD-10-CM | POA: Insufficient documentation

## 2016-08-29 DIAGNOSIS — M19041 Primary osteoarthritis, right hand: Secondary | ICD-10-CM | POA: Insufficient documentation

## 2016-08-29 DIAGNOSIS — I2510 Atherosclerotic heart disease of native coronary artery without angina pectoris: Secondary | ICD-10-CM | POA: Insufficient documentation

## 2016-08-29 DIAGNOSIS — N189 Chronic kidney disease, unspecified: Secondary | ICD-10-CM | POA: Insufficient documentation

## 2016-08-29 DIAGNOSIS — K219 Gastro-esophageal reflux disease without esophagitis: Secondary | ICD-10-CM | POA: Insufficient documentation

## 2016-08-29 DIAGNOSIS — M19031 Primary osteoarthritis, right wrist: Secondary | ICD-10-CM | POA: Insufficient documentation

## 2016-08-29 DIAGNOSIS — G4730 Sleep apnea, unspecified: Secondary | ICD-10-CM | POA: Insufficient documentation

## 2016-08-29 DIAGNOSIS — I5022 Chronic systolic (congestive) heart failure: Secondary | ICD-10-CM | POA: Insufficient documentation

## 2016-08-29 HISTORY — PX: CARPAL TUNNEL RELEASE: SHX101

## 2016-08-29 SURGERY — CARPAL TUNNEL RELEASE
Anesthesia: Regional | Site: Wrist | Laterality: Left

## 2016-08-29 MED ORDER — SCOPOLAMINE 1 MG/3DAYS TD PT72: 1.0000 | MEDICATED_PATCH | Freq: Once | TRANSDERMAL | Status: DC | PRN

## 2016-08-29 MED ORDER — CEFAZOLIN SODIUM-DEXTROSE 2-4 GM/100ML-% IV SOLN
INTRAVENOUS | Status: AC
  Filled 2016-08-29: qty 100

## 2016-08-29 MED ORDER — MIDAZOLAM HCL 2 MG/2ML IJ SOLN: 1.0000 mg | INTRAMUSCULAR | Status: DC | PRN

## 2016-08-29 MED ORDER — BUPIVACAINE HCL (PF) 0.25 % IJ SOLN
INTRAMUSCULAR | Status: DC | PRN
  Administered 2016-08-29: 9 mL

## 2016-08-29 MED ORDER — LACTATED RINGERS IV SOLN
INTRAVENOUS | Status: DC
  Administered 2016-08-29: 08:00:00 via INTRAVENOUS

## 2016-08-29 MED ORDER — TRAMADOL HCL 50 MG PO TABS: 50.0000 mg | ORAL_TABLET | Freq: Four times a day (QID) | ORAL | 0 refills | Status: DC | PRN

## 2016-08-29 MED ORDER — CEFAZOLIN SODIUM-DEXTROSE 2-4 GM/100ML-% IV SOLN
2.0000 g | INTRAVENOUS | Status: AC
  Administered 2016-08-29: 15 g via INTRAVENOUS

## 2016-08-29 MED ORDER — ONDANSETRON HCL 4 MG/2ML IJ SOLN
INTRAMUSCULAR | Status: DC | PRN
  Administered 2016-08-29: 4 mg via INTRAVENOUS

## 2016-08-29 MED ORDER — DEXAMETHASONE SODIUM PHOSPHATE 4 MG/ML IJ SOLN
INTRAMUSCULAR | Status: DC | PRN
  Administered 2016-08-29: 10 mg via INTRAVENOUS

## 2016-08-29 MED ORDER — FENTANYL CITRATE (PF) 100 MCG/2ML IJ SOLN: 25.0000 ug | INTRAMUSCULAR | Status: DC | PRN

## 2016-08-29 MED ORDER — ONDANSETRON HCL 4 MG/2ML IJ SOLN: 4.0000 mg | Freq: Once | INTRAMUSCULAR | Status: DC | PRN

## 2016-08-29 MED ORDER — LIDOCAINE 2% (20 MG/ML) 5 ML SYRINGE
INTRAMUSCULAR | Status: AC
  Filled 2016-08-29: qty 5

## 2016-08-29 MED ORDER — FENTANYL CITRATE (PF) 100 MCG/2ML IJ SOLN: 50.0000 ug | INTRAMUSCULAR | Status: DC | PRN

## 2016-08-29 MED ORDER — FENTANYL CITRATE (PF) 100 MCG/2ML IJ SOLN
INTRAMUSCULAR | Status: DC | PRN
Start: 2016-08-29 — End: 2016-08-29
  Administered 2016-08-29: 25 ug via INTRAVENOUS

## 2016-08-29 MED ORDER — GLYCOPYRROLATE 0.2 MG/ML IJ SOLN
0.2000 mg | Freq: Once | INTRAMUSCULAR | Status: AC | PRN
  Administered 2016-08-29: 0.2 mg via INTRAVENOUS

## 2016-08-29 MED ORDER — CHLORHEXIDINE GLUCONATE 4 % EX LIQD: 60.0000 mL | Freq: Once | CUTANEOUS | Status: DC

## 2016-08-29 MED ORDER — DEXAMETHASONE SODIUM PHOSPHATE 10 MG/ML IJ SOLN
INTRAMUSCULAR | Status: AC
  Filled 2016-08-29: qty 1

## 2016-08-29 MED ORDER — PROPOFOL 10 MG/ML IV BOLUS
INTRAVENOUS | Status: AC
  Filled 2016-08-29: qty 20

## 2016-08-29 MED ORDER — GLYCOPYRROLATE 0.2 MG/ML IV SOSY
PREFILLED_SYRINGE | INTRAVENOUS | Status: AC
  Filled 2016-08-29: qty 3

## 2016-08-29 MED ORDER — FENTANYL CITRATE (PF) 100 MCG/2ML IJ SOLN
INTRAMUSCULAR | Status: AC
  Filled 2016-08-29: qty 2

## 2016-08-29 MED ORDER — ONDANSETRON HCL 4 MG/2ML IJ SOLN
INTRAMUSCULAR | Status: AC
  Filled 2016-08-29: qty 2

## 2016-08-29 MED ORDER — PROPOFOL 10 MG/ML IV BOLUS
INTRAVENOUS | Status: DC | PRN
  Administered 2016-08-29: 100 mg via INTRAVENOUS

## 2016-08-29 SURGICAL SUPPLY — 35 items
BLADE SURG 15 STRL LF DISP TIS (BLADE) ×1 IMPLANT
BLADE SURG 15 STRL SS (BLADE) ×3
BNDG CMPR 9X4 STRL LF SNTH (GAUZE/BANDAGES/DRESSINGS) ×1
BNDG COHESIVE 3X5 TAN STRL LF (GAUZE/BANDAGES/DRESSINGS) ×3 IMPLANT
BNDG ESMARK 4X9 LF (GAUZE/BANDAGES/DRESSINGS) ×3 IMPLANT
BNDG GAUZE ELAST 4 BULKY (GAUZE/BANDAGES/DRESSINGS) ×3 IMPLANT
CHLORAPREP W/TINT 26ML (MISCELLANEOUS) ×3 IMPLANT
CORDS BIPOLAR (ELECTRODE) ×3 IMPLANT
COVER BACK TABLE 60X90IN (DRAPES) ×3 IMPLANT
COVER MAYO STAND STRL (DRAPES) ×3 IMPLANT
CUFF TOURNIQUET SINGLE 18IN (TOURNIQUET CUFF) ×3 IMPLANT
DRAPE EXTREMITY T 121X128X90 (DRAPE) ×3 IMPLANT
DRAPE SURG 17X23 STRL (DRAPES) ×3 IMPLANT
DRSG PAD ABDOMINAL 8X10 ST (GAUZE/BANDAGES/DRESSINGS) ×3 IMPLANT
GAUZE SPONGE 4X4 12PLY STRL (GAUZE/BANDAGES/DRESSINGS) ×3 IMPLANT
GAUZE XEROFORM 1X8 LF (GAUZE/BANDAGES/DRESSINGS) ×3 IMPLANT
GLOVE BIO SURGEON STRL SZ 6.5 (GLOVE) ×2 IMPLANT
GLOVE BIO SURGEON STRL SZ7 (GLOVE) ×3 IMPLANT
GLOVE BIO SURGEONS STRL SZ 6.5 (GLOVE) ×1
GLOVE BIOGEL PI IND STRL 8.5 (GLOVE) ×1 IMPLANT
GLOVE BIOGEL PI INDICATOR 8.5 (GLOVE) ×2
GLOVE SURG ORTHO 8.0 STRL STRW (GLOVE) ×3 IMPLANT
GOWN STRL REUS W/ TWL LRG LVL3 (GOWN DISPOSABLE) ×1 IMPLANT
GOWN STRL REUS W/TWL LRG LVL3 (GOWN DISPOSABLE) ×3
GOWN STRL REUS W/TWL XL LVL3 (GOWN DISPOSABLE) ×3 IMPLANT
NEEDLE PRECISIONGLIDE 27X1.5 (NEEDLE) ×3 IMPLANT
NS IRRIG 1000ML POUR BTL (IV SOLUTION) ×3 IMPLANT
PACK BASIN DAY SURGERY FS (CUSTOM PROCEDURE TRAY) ×3 IMPLANT
STOCKINETTE 4X48 STRL (DRAPES) ×3 IMPLANT
SUT ETHILON 4 0 PS 2 18 (SUTURE) ×3 IMPLANT
SUT VICRYL 4-0 PS2 18IN ABS (SUTURE) IMPLANT
SYR BULB 3OZ (MISCELLANEOUS) ×3 IMPLANT
SYR CONTROL 10ML LL (SYRINGE) ×3 IMPLANT
TOWEL OR 17X24 6PK STRL BLUE (TOWEL DISPOSABLE) ×3 IMPLANT
UNDERPAD 30X30 (UNDERPADS AND DIAPERS) ×3 IMPLANT

## 2016-08-29 NOTE — Anesthesia Postprocedure Evaluation (Signed)
Anesthesia Post Note  Patient: [name]  Procedure(s) Performed: Procedure(s) (LRB): LEFT CARPAL TUNNEL RELEASE (Left)  Patient location during evaluation: PACU Anesthesia Type: General Level of consciousness: awake and alert Pain management: pain level controlled Vital Signs Assessment: post-procedure vital signs reviewed and stable Respiratory status: spontaneous breathing, nonlabored ventilation and respiratory function stable Cardiovascular status: blood pressure returned to baseline and stable Postop Assessment: no signs of nausea or vomiting Anesthetic complications: no    Last Vitals:  Vitals:   08/29/16 1015 08/29/16 1030  BP: (!) 166/51 (!) 169/91  Pulse: (!) 51 (!) 49  Resp: 13 15  Temp:      Last Pain:  Vitals:   08/29/16 1030  TempSrc:   PainSc: 0-No pain                 Nilda Simmer

## 2016-08-29 NOTE — Brief Op Note (Signed)
08/29/2016  9:47 AM  PATIENT:  [name]  82 y.o. male  PRE-OPERATIVE DIAGNOSIS:  LEFT CARPAL TUNNEL SYNDROME  POST-OPERATIVE DIAGNOSIS:  Left Carpal Tunnel Syndrome  PROCEDURE:  Procedure(s) with comments: LEFT CARPAL TUNNEL RELEASE (Left) - REG/FAB  SURGEON:  Surgeon(s) and Role:    * [name], MD - Primary  PHYSICIAN ASSISTANT:   ASSISTANTS: none   ANESTHESIA:   local and general  EBL:  Total I/O In: -  Out: 1 [Blood:1]  BLOOD ADMINISTERED:none  DRAINS: none   LOCAL MEDICATIONS USED:  BUPIVICAINE   SPECIMEN:  No Specimen  DISPOSITION OF SPECIMEN:  N/A  COUNTS:  YES  TOURNIQUET:   Total Tourniquet Time Documented: Forearm (Left) - 11 minutes Total: Forearm (Left) - 11 minutes   DICTATION: .Other Dictation: Dictation Number [id number]  PLAN OF CARE: Discharge to home after PACU  PATIENT DISPOSITION:  PACU - hemodynamically stable.

## 2016-08-29 NOTE — Discharge Instructions (Signed)

## 2016-08-29 NOTE — Anesthesia Procedure Notes (Signed)
Procedure Name: LMA Insertion Date/Time: 08/29/2016 9:25 AM Performed by: Toula Moos L Pre-anesthesia Checklist: Patient identified, Emergency Drugs available, Suction available, Patient being monitored and Timeout performed Patient Re-evaluated:Patient Re-evaluated prior to inductionOxygen Delivery Method: Circle system utilized Preoxygenation: Pre-oxygenation with 100% oxygen Intubation Type: IV induction Ventilation: Mask ventilation without difficulty LMA: LMA inserted LMA Size: 5.0 Number of attempts: 1 Airway Equipment and Method: Bite block Placement Confirmation: positive ETCO2 Tube secured with: Tape Dental Injury: Teeth and Oropharynx as per pre-operative assessment

## 2016-08-29 NOTE — Op Note (Signed)
Dictation Number [id number]

## 2016-08-29 NOTE — Addendum Note (Signed)
Addendum  created 08/29/16 1143 by Marrianne Mood, CRNA   Charge Capture section accepted

## 2016-08-29 NOTE — Transfer of Care (Signed)
Immediate Anesthesia Transfer of Care Note  Patient: [name]  Procedure(s) Performed: Procedure(s) with comments: LEFT CARPAL TUNNEL RELEASE (Left) - REG/FAB  Patient Location: PACU  Anesthesia Type:General  Level of Consciousness: awake and patient cooperative  Airway & Oxygen Therapy: Patient Spontanous Breathing and Patient connected to face mask oxygen  Post-op Assessment: Report given to RN and Post -op Vital signs reviewed and stable  Post vital signs: Reviewed and stable  Last Vitals:  Vitals:   08/29/16 0954 08/29/16 0955  BP: (!) 170/88   Pulse: 73 68  Resp:  13  Temp:      Last Pain:  Vitals:   08/29/16 0745  TempSrc: Oral         Complications: No apparent anesthesia complications

## 2016-08-29 NOTE — H&P (Signed)
[name] is an 82 y.o. male.   Chief Complaint:numbness  left hand HPI: [name] is an 82 year old right-hand-dominant male who was referred by Dr. Marla Roe for consultation regarding numbness tingling burning of his left hand. This been going on for 6 months. Is constant in nature. He has a mass on the back of his shoulder felt to be a lipoma. He has had nerve conductions done by Dr. Alcide Evener. He recalls no history of injury to his hand or his neck. He is on Plavix for stent treated by Dr. Wynonia Lawman. He has been on Celebrex and recently started on Mobic. He has hip neck problems. He has had total joint replacements to his hips. He is awake and 4-5 out of 7 9 nights a week. He complains of the numbness and tingling thumb through ring fingers. He states his VAS score is 8-9/10. He states routinely it is 4-5. He has no history of diabetes thyroid problems he does have a history of arthritis he has no history of gout. His family history is negative for diabetes thyroid problems positive for arthritis negative for gout. Past Medical History:  Diagnosis Date  . Arthritis  . Benign localized hyperplasia of prostate without urinary obstruction and other lower urinary tract symptoms (LUTS)  . Carpal tunnel syndrome  . CHF (congestive heart failure) ([place])  . Hypertension  . Hypertension  . Kidney stone   Past Surgical History:  Procedure Laterality Date  . APPENDECTOMY  . CORONARY ANGIOPLASTY WITH STENT PLACEMENT  . hernia repair surgery  . JOINT REPLACEMENT Bilateral  hips  . LITHOTRIPSY  . TOTAL HIP ARTHROPLASTY       Past Medical History:  Diagnosis Date  . BPH (benign prostatic hyperplasia)   . Chronic sinus bradycardia   . Chronic systolic (congestive) heart failure ([place])   . CKD (chronic kidney disease), stage III   . Coronary artery disease   . Diverticulosis   . GERD (gastroesophageal reflux disease)   . Hypercholesterolemia   . Hypertension   . IBS (irritable bowel  syndrome)   . Kidney stone   . Left hip pain    Chronic left hip discomfort  . Myocardial infarction [facility]) 2001   Previous anterolateral myocardial infarction with presistent ST-T wave changes  . Osteoarthritis   . Sleep apnea    uses cpap  . SVT (supraventricular tachycardia) ([place])   . Tubular adenoma     Past Surgical History:  Procedure Laterality Date  . ANGIOPLASTY  2002   with stent and angioplasty in 2003  . APPENDECTOMY    . BACK SURGERY    . CARDIAC CATHETERIZATION  05/07/2002   Ejection fraction 20%  . FINGER SURGERY    . HIP CLOSED REDUCTION  01/31/2012   Procedure: CLOSED MANIPULATION HIP;  Surgeon: [name], MD;  Location: [facility];  Service: Orthopedics;  Laterality: Right;  closed reduction right dislocated hip  . HIP CLOSED REDUCTION  10/10/2012   Procedure: CLOSED MANIPULATION HIP;  Surgeon: [name], MD;  [facility]: [facility];  Service: Orthopedics;  Laterality: Right;  . HIP CLOSED REDUCTION Right 03/17/2015   Procedure: CLOSED REDUCTION HIP;  Surgeon: Rod Can, MD;  [facility]: [facility];  Service: Orthopedics;  Laterality: Right;  . JOINT REPLACEMENT     BILATERAL HIP REPLACEMENTS  . TONSILLECTOMY    . TOTAL HIP REVISION Right 09/06/2015   Procedure: RIGHT TOTAL HIP ACETABULAR LINER REVISION (CONSTRAINED LINER);  Surgeon: [name], MD;  Location: [facility];  Service: Orthopedics;  Laterality: Right;  . TRANSURETHRAL RESECTION OF PROSTATE      Family History  Problem Relation Age of Onset  . Heart attack Father   . Stroke Father   . Stroke Mother   . Alzheimer's disease Mother    Social History:  reports that he has never smoked. He has never used smokeless tobacco. He reports that he does not drink alcohol or use drugs.  Allergies:  Allergies  Allergen Reactions  . Beef-Derived Products Hives    Severe hives, nausea, throat swelling  . Prilosec [Omeprazole]     Diarrhea, stomach cramps  . Tylenol [Acetaminophen]     urticaria  .  Prednisone Palpitations    REACTION: causes tachycardia, increase BP    No prescriptions prior to admission.    Results for orders placed or performed during the hospital encounter of 08/29/16 (from the past 48 hour(s))  Basic metabolic panel     Status: Abnormal   Collection Time: 08/28/16 11:00 AM  Result Value Ref Range   Sodium 141 135 - 145 mmol/L   Potassium 5.1 3.5 - 5.1 mmol/L   Chloride 109 101 - 111 mmol/L   CO2 27 22 - 32 mmol/L   Glucose, Bld 113 (H) 65 - 99 mg/dL   BUN 29 (H) 6 - 20 mg/dL   Creatinine, Ser 1.65 (H) 0.61 - 1.24 mg/dL   Calcium 9.3 8.9 - 10.3 mg/dL   GFR calc non Af Amer 35 (L) >60 mL/min   GFR calc Af Amer 41 (L) >60 mL/min    Comment: (NOTE) The eGFR has been calculated using the CKD EPI equation. This calculation has not been validated in all clinical situations. eGFR's persistently <60 mL/min signify possible Chronic Kidney Disease.    Anion gap 5 5 - 15    No results found.   Pertinent items are noted in HPI.  Height 5' 6"  (1.676 m), weight 72.6 kg (160 lb).  General appearance: alert, cooperative and appears stated age Head: Normocephalic, without obvious abnormality Neck: no JVD Resp: clear to auscultation bilaterally Cardio: regular rate and rhythm, S1, S2 normal, no murmur, click, rub or gallop GI: soft, non-tender; bowel sounds normal; no masses,  no organomegaly Extremities: numbness left hand Pulses: 2+ and symmetric Skin: Skin color, texture, turgor normal. No rashes or lesions Neurologic: Grossly normal Incision/Wound: Na   Assessment/Plan Assessment:  1. Bilateral carpal tunnel syndrome ORTHO XR HAND 2 VW BILATERAL AP, Lateral  2. Primary osteoarthritis of both hands  3. Primary osteoarthritis of both first carpometacarpal joints    Plan: We have discussed with him the possibility of surgical intervention on his carpal tunnel. He would like to proceed to have this done. Pre-peri-and postoperative course were  discussed along with risks and complications. He is advised that there is no guarantee with the surgery the possibility of infection recurrence injury to arteries nerves tendons incomplete relief of symptoms and dystrophy.  This can be scheduled as an outpatient under regional anesthesia.      Loria Lacina R 08/29/2016, 5:05 AM

## 2016-08-30 NOTE — Op Note (Signed)
NAMEJASMON, SETTLE NO.:  [id number]  MEDICAL RECORD NO.:  [id number]  LOCATION:                                 FACILITY:  PHYSICIAN:  [name], M.D.       DATE OF BIRTH:  [birth year]  DATE OF PROCEDURE:  08/29/2016 DATE OF DISCHARGE:                              OPERATIVE REPORT   PREOPERATIVE DIAGNOSIS:  Carpal tunnel syndrome, left hand.  POSTOPERATIVE DIAGNOSIS:  Carpal tunnel syndrome, left hand.  OPERATION:  Decompression of left median nerve.  SURGEON:  [name], M.D.  ANESTHESIA:  General with local infiltration.  PLACE OF SURGERY:  [facility].  ANESTHESIOLOGIST:  Dr. Zenia Resides.  HISTORY:  The patient is an 82 year old male with a history of carpal tunnel syndrome.  EMG nerve conduction is positive, which has not responded to conservative treatment.  He has elected to undergo surgical decompression of the nerve.  Pre, peri and postoperative course have been discussed along with risks and complications.  He is aware that there was no guarantee to the surgery; possibility of infection; recurrence of injury to arteries, nerves, tendons; incomplete relief of symptoms and dystrophy.  In the preoperative area, the patient is seen, the extremity marked by both the patient and surgeon.  PROCEDURE IN DETAIL:  The patient was brought to the operating room, where a general anesthetic was carried out without difficulty under the direction of Dr. Zenia Resides.  He was prepped using ChloraPrep, supine position with left arm free.  A 3-minute dry time was allowed, time-out was taken confirming the patient and procedure.  The limb was exsanguinated with an Esmarch bandage.  Tourniquet placed high on the arm was inflated to 250 mmHg.  A longitudinal incision was made in the left palm, carried down through the subcutaneous tissue.  Bleeders were electrocauterized with bipolar.  The palmar fascia was split. Superficial palmar arch was identified.  The  flexor tendon to the ring and little finger was identified.  Retractors were placed filling the median nerve radially after identifying the flexor tendon to the ring finger.  Retractor was placed ulnarly protecting the ulnar nerve.  With sharp dissection, the flexor retinaculum was incised.  A right angle and Sewall retractor were placed between the skin and forearm fascia.  The nerve was dissected from the underlying fascia.  This was done with blunt dissection and the then a blunt scissors were used to release the flexor retinaculum and the distal forearm fascia for approximately 2 cm proximal to the wrist crease under direct vision.  The canal was explored.  Area compression to the nerve was apparent.  The patient had two motor branches.  Both were intact.  The wound was copiously irrigated with saline.  The wound was then closed with interrupted 4-0 nylon sutures.  A local infiltration with 0.25% bupivacaine without epinephrine was given.  9 mL was used.  A sterile compressive dressing with the fingers free was applied.  On deflation of the tourniquet, all fingers were immediately pinked.  He was taken to the recovery room for observation in satisfactory condition.  He will be discharged to home to return to the [facility]  [place] in 1 week, on [facility].    ______________________________ [name], M.D.   ______________________________ [name], M.D.    GK/MEDQ  D:  08/29/2016  T:  08/30/2016  Job:  [id number]

## 2016-09-01 ENCOUNTER — Encounter (HOSPITAL_BASED_OUTPATIENT_CLINIC_OR_DEPARTMENT_OTHER): Payer: Self-pay | Admitting: Orthopedic Surgery

## 2017-01-28 DIAGNOSIS — R310 Gross hematuria: Secondary | ICD-10-CM

## 2017-01-28 HISTORY — DX: Gross hematuria: R31.0

## 2018-04-02 DIAGNOSIS — H6123 Impacted cerumen, bilateral: Secondary | ICD-10-CM | POA: Insufficient documentation

## 2018-04-02 HISTORY — DX: Impacted cerumen, bilateral: H61.23

## 2018-09-21 ENCOUNTER — Other Ambulatory Visit: Payer: Self-pay

## 2018-09-21 DIAGNOSIS — I255 Ischemic cardiomyopathy: Secondary | ICD-10-CM | POA: Insufficient documentation

## 2018-09-21 DIAGNOSIS — I480 Paroxysmal atrial fibrillation: Secondary | ICD-10-CM

## 2018-09-21 HISTORY — DX: Ischemic cardiomyopathy: I25.5

## 2018-09-21 HISTORY — DX: Paroxysmal atrial fibrillation: I48.0

## 2018-09-24 ENCOUNTER — Ambulatory Visit
Admission: RE | Admit: 2018-09-24 | Discharge: 2018-09-24 | Disposition: A | Discharge status: Home / Self Care | Payer: Medicare Other | Source: Ambulatory Visit | Attending: Family Medicine | Admitting: Family Medicine

## 2018-09-24 ENCOUNTER — Other Ambulatory Visit: Payer: Self-pay | Admitting: Family Medicine

## 2018-09-24 DIAGNOSIS — W19XXXA Unspecified fall, initial encounter: Secondary | ICD-10-CM

## 2018-10-01 NOTE — Progress Notes (Signed)
Cardiology Office Note:    Date:  10/02/2018   ID:  [name], DOB Nov 11, 1925, MRN [id number]  PCP:  Leonard Downing, MD  Cardiologist:  Shirlee More, MD    Referring MD: Leonard Downing, *    ASSESSMENT:    1. Chronic systolic (congestive) heart failure ([place])   2. Hypertensive heart disease with heart failure ([name])   3. Coronary artery disease involving native coronary artery of native heart with angina pectoris ([name])   4. Hyperlipidemia, unspecified hyperlipidemia type   5. Nonrheumatic mitral valve regurgitation   6. Chronic atrial fibrillation    PLAN:    In order of problems listed above:  1. Stable compensated we discussed transition from ACE inhibitor to ARNI but he takes such a small dose would need to be quite careful and prior to doing I wanted to recheck renal function and proBNP level. 2. See above blood pressure target 3. Stable asymptomatic having no anginal discomfort we will continue medical therapy including his low-dose anticoagulant beta-blocker 4. He is presently not on a statin check lipid profile and address after results return 5. Stable clinically I think he be a poor candidate for cardiac interventional procedures especially with compensated heart failure 6. Stable rate controlled continue low-dose anticoagulant I encouraged him to use a cane all the time when he walks for safety and to continue to monitor home heart rates and call me are less than 60.  I told his daughter that there is an additional fact of his Aricept which can potentiate the beta-blocker for heart rate control   Next appointment: 3 months   Medication Adjustments/Labs and Tests Ordered: Current medicines are reviewed at length with the patient today.  Concerns regarding medicines are outlined above.  Orders Placed This Encounter  Procedures  . Comprehensive Metabolic Panel (CMET)  . Lipid Profile  . Pro b natriuretic peptide (BNP)  . CBC  . EKG 12-Lead   No  orders of the defined types were placed in this encounter.   Chief Complaint  Patient presents with  . Follow-up  . Congestive Heart Failure  . Coronary Artery Disease    History of Present Illness:    [name] is a 84 y.o. male with a hx of CAD with PCI in 2002 and 2003, SVT, sinus bradycardia, hyperlipidemia and heart failure  EF 30% in Dec 2016 with moderate AR, MR and TR and stage 3 CKD in 2017 . he was last seen by Dr. Wynonia Lawman 06/05/2018 . He underwent left heart catheterization 01/20/2017 ejection fraction 25% patent stent in the left anterior descending coronary artery and had no further revascularization.  Last labs 01/16/2016 cholesterol 140 LDL 72 HDL 55.  Compliance with diet, lifestyle and medications: Yes  He has a history of falls is unsteady and fell in the last week with hip pain and outpatient x-ray showed no evidence of trauma and fracture.  Overall he is done well he complains of fatigue but no edema shortness of breath chest pain palpitation or TIA.  His home heart rates run in the 70s.  He is supervised by his daughter.  She is unsure if he has had any recent lab work performed I am unable to access any testing.  Past Medical History:  Diagnosis Date  . BPH (benign prostatic hyperplasia)   . Chronic sinus bradycardia   . Chronic systolic (congestive) heart failure ([name])   . CKD (chronic kidney disease), stage III ([name])   . Coronary  artery disease   . Diverticulosis   . GERD (gastroesophageal reflux disease)   . Hypercholesterolemia   . Hypertension   . IBS (irritable bowel syndrome)   . Ischemic cardiomyopathy 09/21/2018  . Kidney stone   . Left hip pain    Chronic left hip discomfort  . Myocardial infarction [facility]) 2001   Previous anterolateral myocardial infarction with presistent ST-T wave changes  . Osteoarthritis   . Sleep apnea    uses cpap  . SVT (supraventricular tachycardia) ([name])   . Tubular adenoma     Past Surgical History:  Procedure  Laterality Date  . ANGIOPLASTY  2002   with stent and angioplasty in 2003  . APPENDECTOMY    . BACK SURGERY    . CARDIAC CATHETERIZATION  05/07/2002   Ejection fraction 20%  . CARPAL TUNNEL RELEASE Left 08/29/2016   Procedure: LEFT CARPAL TUNNEL RELEASE;  Surgeon: [name], MD;  Location: [facility];  Service: Orthopedics;  Laterality: Left;  REG/FAB  . FINGER SURGERY    . HIP CLOSED REDUCTION  01/31/2012   Procedure: CLOSED MANIPULATION HIP;  Surgeon: [name], MD;  [facility];  Service: Orthopedics;  Laterality: Right;  closed reduction right dislocated hip  . HIP CLOSED REDUCTION  10/10/2012   Procedure: CLOSED MANIPULATION HIP;  Surgeon: [name], MD;  [facility];  Service: Orthopedics;  Laterality: Right;  . HIP CLOSED REDUCTION Right 03/17/2015   Procedure: CLOSED REDUCTION HIP;  Surgeon: Rod Can, MD;  [facility];  Service: Orthopedics;  Laterality: Right;  . JOINT REPLACEMENT     BILATERAL HIP REPLACEMENTS  . TONSILLECTOMY    . TOTAL HIP REVISION Right 09/06/2015   Procedure: RIGHT TOTAL HIP ACETABULAR LINER REVISION (CONSTRAINED LINER);  Surgeon: [name], MD;  [facility];  Service: Orthopedics;  Laterality: Right;  . TRANSURETHRAL RESECTION OF PROSTATE      Current Medications: Current Meds  Medication Sig  . apixaban (ELIQUIS) 2.5 MG TABS tablet Take 2.5 mg by mouth 2 (two) times daily.  [name] donepezil (ARICEPT) 10 MG tablet Take 10 mg by mouth at bedtime.  . enalapril (VASOTEC) 2.5 MG tablet Take 2.5 mg by mouth daily.  . ferrous sulfate 325 (65 FE) MG tablet Take 325 mg by mouth 2 (two) times daily with a meal.   . furosemide (LASIX) 20 MG tablet Take 20 mg by mouth 2 (two) times daily.   . Metoprolol Succinate 25 MG CS24 Take 1 tablet by mouth daily.  [name] terazosin (HYTRIN) 10 MG capsule Take 1 capsule (10 mg total) by mouth at bedtime.     Allergies:   Beef-derived products; Acetaminophen; Amiodarone; Beef  extract; Prilosec [omeprazole]; and Prednisone   Social History   Socioeconomic History  . Marital status: Married    Spouse name: Not on file  . Number of children: Not on file  . Years of education: Not on file  . Highest education level: Not on file  Occupational History  . Not on file  Social Needs  . Financial resource strain: Not on file  . Food insecurity:    Worry: Not on file    Inability: Not on file  . Transportation needs:    Medical: Not on file    Non-medical: Not on file  Tobacco Use  . Smoking status: Never Smoker  . Smokeless tobacco: Never Used  Substance and Sexual Activity  . Alcohol use: No  . Drug use: No  . Sexual  activity: Never  Lifestyle  . Physical activity:    Days per week: Not on file    Minutes per session: Not on file  . Stress: Not on file  Relationships  . Social connections:    Talks on phone: Not on file    Gets together: Not on file    Attends religious service: Not on file    Active member of club or organization: Not on file    Attends meetings of clubs or organizations: Not on file    Relationship status: Not on file  Other Topics Concern  . Not on file  Social History Narrative  . Not on file     Family History: The patient's family history includes Alzheimer's disease in his mother; Cancer in his sister; Heart attack in his father; Stroke in his father and mother. ROS:   Please see the history of present illness.    All other systems reviewed and are negative.  EKGs/Labs/Other Studies Reviewed:    The following studies were reviewed today:  EKG:  EKG ordered today.  The ekg ordered today demonstrates atrial fibrillation rate 60 bpm  Recent Labs: No results found for requested labs within last 8760 hours.  Recent Lipid Panel    Component Value Date/Time   CHOL 140 01/15/2016 1104   TRIG 65 01/15/2016 1104   HDL 55 01/15/2016 1104   CHOLHDL 2.5 01/15/2016 1104   VLDL 13 01/15/2016 1104   LDLCALC 72 01/15/2016  1104    Physical Exam:    VS:  BP 112/78 (BP Location: Right Arm, Patient Position: Sitting, Cuff Size: Normal)   Pulse 70   Ht 5\' 7"  (1.702 m)   Wt 159 lb 6.4 oz (72.3 kg)   SpO2 98%   BMI 24.97 kg/m     Wt Readings from Last 3 Encounters:  10/02/18 159 lb 6.4 oz (72.3 kg)  08/29/16 157 lb 4 oz (71.3 kg)  06/02/16 164 lb 0.4 oz (74.4 kg)     GEN:  Well nourished, well developed in no acute distress HEENT: Normal NECK: No JVD; No carotid bruits LYMPHATICS: No lymphadenopathy CARDIAC: IrrIrr variable s1 soft s3 RESPIRATORY:  Clear to auscultation without rales, wheezing or rhonchi  ABDOMEN: Soft, non-tender, non-distended MUSCULOSKELETAL:  No edema; No deformity  SKIN: Warm and dry NEUROLOGIC:  Alert and oriented x 3 PSYCHIATRIC:  Normal affect    Signed, Shirlee More, MD  10/02/2018 2:50 PM    [facility]

## 2018-10-02 ENCOUNTER — Ambulatory Visit (INDEPENDENT_AMBULATORY_CARE_PROVIDER_SITE_OTHER): Payer: Medicare Other | Admitting: Cardiology

## 2018-10-02 ENCOUNTER — Encounter: Payer: Self-pay | Admitting: Cardiology

## 2018-10-02 VITALS — BP 112/78 | HR 70 | Ht 67.0 in | Wt 159.4 lb

## 2018-10-02 DIAGNOSIS — I110 Hypertensive heart disease with heart failure: Secondary | ICD-10-CM | POA: Diagnosis not present

## 2018-10-02 DIAGNOSIS — E785 Hyperlipidemia, unspecified: Secondary | ICD-10-CM | POA: Diagnosis not present

## 2018-10-02 DIAGNOSIS — I482 Chronic atrial fibrillation, unspecified: Secondary | ICD-10-CM

## 2018-10-02 DIAGNOSIS — I340 Nonrheumatic mitral (valve) insufficiency: Secondary | ICD-10-CM

## 2018-10-02 DIAGNOSIS — I25119 Atherosclerotic heart disease of native coronary artery with unspecified angina pectoris: Secondary | ICD-10-CM

## 2018-10-02 DIAGNOSIS — I5022 Chronic systolic (congestive) heart failure: Secondary | ICD-10-CM

## 2018-10-02 NOTE — Patient Instructions (Signed)
Medication Instructions:  Your physician recommends that you continue on your current medications as directed. Please refer to the Current Medication list given to you today.  If you need a refill on your cardiac medications before your next appointment, please call your pharmacy.   Lab work: Your physician recommends that you return for lab work today: CMP, lipid panel, ProBNP, CBC.   If you have labs (blood work) drawn today and your tests are completely normal, you will receive your results only by: [name] MyChart Message (if you have MyChart) OR . A paper copy in the mail If you have any lab test that is abnormal or we need to change your treatment, we will call you to review the results.  Testing/Procedures: You had an EKG today.   Follow-Up: At [facility], you and your health needs are our priority.  As part of our continuing mission to provide you with exceptional heart care, we have created designated Provider Care Teams.  These Care Teams include your primary Cardiologist (physician) and Advanced Practice Providers (APPs -  Physician Assistants and Nurse Practitioners) who all work together to provide you with the care you need, when you need it. . You will need a follow up appointment in 3 months.  Please call our office 2 months in advance to schedule this appointment.

## 2018-10-03 LAB — CBC
HEMATOCRIT: 38.2 % (ref 37.5–51.0)
Hemoglobin: 12.9 g/dL — ABNORMAL LOW (ref 13.0–17.7)
MCH: 30.9 pg (ref 26.6–33.0)
MCHC: 33.8 g/dL (ref 31.5–35.7)
MCV: 92 fL (ref 79–97)
Platelets: 176 10*3/uL (ref 150–450)
RBC: 4.17 x10E6/uL (ref 4.14–5.80)
RDW: 13.7 % (ref 12.3–15.4)
WBC: 6.5 10*3/uL (ref 3.4–10.8)

## 2018-10-03 LAB — LIPID PANEL
Chol/HDL Ratio: 4.8 ratio (ref 0.0–5.0)
Cholesterol, Total: 223 mg/dL — ABNORMAL HIGH (ref 100–199)
HDL: 46 mg/dL (ref >39)
LDL CALC: 148 mg/dL — AB (ref 0–99)
TRIGLYCERIDES: 143 mg/dL (ref 0–149)
VLDL CHOLESTEROL CAL: 29 mg/dL (ref 5–40)

## 2018-10-03 LAB — COMPREHENSIVE METABOLIC PANEL
ALK PHOS: 133 IU/L — AB (ref 39–117)
ALT: 15 IU/L (ref 0–44)
AST: 26 IU/L (ref 0–40)
Albumin/Globulin Ratio: 1.8 (ref 1.2–2.2)
Albumin: 4.3 g/dL (ref 3.2–4.6)
BUN/Creatinine Ratio: 24 (ref 10–24)
BUN: 46 mg/dL — AB (ref 10–36)
Bilirubin Total: 0.5 mg/dL (ref 0.0–1.2)
CO2: 23 mmol/L (ref 20–29)
CREATININE: 1.88 mg/dL — AB (ref 0.76–1.27)
Calcium: 9.5 mg/dL (ref 8.6–10.2)
Chloride: 101 mmol/L (ref 96–106)
GFR calc Af Amer: 36 mL/min/{1.73_m2} — ABNORMAL LOW (ref >59)
GFR calc non Af Amer: 31 mL/min/{1.73_m2} — ABNORMAL LOW (ref >59)
GLUCOSE: 97 mg/dL (ref 65–99)
Globulin, Total: 2.4 g/dL (ref 1.5–4.5)
Potassium: 4.4 mmol/L (ref 3.5–5.2)
Sodium: 140 mmol/L (ref 134–144)
Total Protein: 6.7 g/dL (ref 6.0–8.5)

## 2018-10-03 LAB — PRO B NATRIURETIC PEPTIDE: NT-Pro BNP: 4298 pg/mL — ABNORMAL HIGH (ref 0–486)

## 2018-10-06 ENCOUNTER — Other Ambulatory Visit: Payer: Self-pay

## 2018-10-06 MED ORDER — SACUBITRIL-VALSARTAN 24-26 MG PO TABS: 1.0000 | ORAL_TABLET | Freq: Two times a day (BID) | ORAL | 3 refills | Status: DC

## 2018-10-06 MED ORDER — PRAVASTATIN SODIUM 20 MG PO TABS: 20.0000 mg | ORAL_TABLET | Freq: Every evening | ORAL | 3 refills | Status: DC

## 2018-10-07 ENCOUNTER — Telehealth: Payer: Self-pay | Admitting: Cardiology

## 2018-10-07 NOTE — Telephone Encounter (Signed)
Patient's wife informed that free 30 day trial for entresto has been approved by pharmacy. She verbally understand and will inform patient.

## 2018-10-07 NOTE — Telephone Encounter (Signed)
Spoke with patient's wife, [name], to inform her that I contacted the pharmacy and they advised that before entresto could be filled a prior authorization had to be completed and processed. PA sent in for review through [email or web address]. Advised [name] I would contact them with more updates. [name] verbalized understanding. No further questions.

## 2018-10-07 NOTE — Telephone Encounter (Signed)
Patient called to inform us that his [name] was not filled, the pharmacy said there was an issue. Can this be checked into?

## 2018-10-13 NOTE — Telephone Encounter (Signed)
Renee from [place] called stating that they had not received the prior authorization for entresto that was completed last week so a PA was completed again over the phone. Patient was approved automatically. Renee states that documentation would be faxed to our office for approval and patient's pharmacy would be notified.

## 2018-10-13 NOTE — Telephone Encounter (Signed)
Patient's wife, [name], informed that the prior authorization for entresto has been approved and they should be able to pick up this medication when needed with no complications. [name] verbalized understanding. No further questions.

## 2018-10-19 ENCOUNTER — Telehealth: Payer: Self-pay | Admitting: Cardiology

## 2018-10-19 NOTE — Telephone Encounter (Signed)
Patient called to inform doctor he has not felt the same since starting Entresto and would like a call about changing meds.

## 2018-10-20 NOTE — Telephone Encounter (Signed)
Spoke with patient regarding concerns about entresto. Patient started this medication on Monday, 10/12/18. He states for the past couple of weeks he has not been sleeping good and has had no energy during the day. He is unsure if it is related to entresto but just wanted to see if he should try another medication. Patient states that he has had some left kidney pain that has resolved at this time. Denies burning/painful urination. His urine has been clear with no odor. Advised patient to contact his PCP if symptoms return. Patient verbalized understanding. Advised patient to continue current medications as prescribed. Informed him to contact our office if symptoms worsen or fail to improve in a week or so. Patient verbalized understanding. No further questions.

## 2018-10-27 DIAGNOSIS — H6123 Impacted cerumen, bilateral: Secondary | ICD-10-CM | POA: Insufficient documentation

## 2018-10-27 DIAGNOSIS — H9113 Presbycusis, bilateral: Secondary | ICD-10-CM | POA: Insufficient documentation

## 2018-10-27 HISTORY — DX: Impacted cerumen, bilateral: H61.23

## 2018-11-10 ENCOUNTER — Telehealth: Payer: Self-pay | Admitting: *Deleted

## 2018-11-10 ENCOUNTER — Other Ambulatory Visit: Payer: Self-pay | Admitting: Family Medicine

## 2018-11-10 NOTE — Telephone Encounter (Signed)
Faxed blood work from Oct. 2019 to Dr. Claris Gower, pt's pcp. Per Jeani Hawking at Dr. Arelia Sneddon office.

## 2018-11-18 ENCOUNTER — Other Ambulatory Visit: Payer: Self-pay | Admitting: Family Medicine

## 2018-11-18 DIAGNOSIS — M5442 Lumbago with sciatica, left side: Secondary | ICD-10-CM

## 2018-11-21 ENCOUNTER — Ambulatory Visit
Admission: RE | Admit: 2018-11-21 | Discharge: 2018-11-21 | Disposition: A | Discharge status: Home / Self Care | Payer: Medicare Other | Source: Ambulatory Visit | Attending: Family Medicine | Admitting: Family Medicine

## 2018-11-21 DIAGNOSIS — M5442 Lumbago with sciatica, left side: Secondary | ICD-10-CM

## 2018-11-26 ENCOUNTER — Other Ambulatory Visit: Payer: Self-pay | Admitting: Family Medicine

## 2018-11-26 ENCOUNTER — Other Ambulatory Visit: Payer: Self-pay | Admitting: Nurse Practitioner

## 2018-11-26 DIAGNOSIS — K869 Disease of pancreas, unspecified: Secondary | ICD-10-CM

## 2018-11-30 ENCOUNTER — Other Ambulatory Visit: Payer: Self-pay | Admitting: Family Medicine

## 2018-11-30 DIAGNOSIS — R5381 Other malaise: Secondary | ICD-10-CM

## 2018-12-01 ENCOUNTER — Other Ambulatory Visit: Payer: Self-pay | Admitting: Family Medicine

## 2018-12-01 DIAGNOSIS — R911 Solitary pulmonary nodule: Secondary | ICD-10-CM

## 2018-12-14 ENCOUNTER — Ambulatory Visit
Admission: RE | Admit: 2018-12-14 | Discharge: 2018-12-14 | Disposition: A | Discharge status: Home / Self Care | Payer: Medicare Other | Source: Ambulatory Visit | Attending: Family Medicine | Admitting: Family Medicine

## 2018-12-14 ENCOUNTER — Telehealth: Payer: Self-pay | Admitting: Cardiology

## 2018-12-14 DIAGNOSIS — R911 Solitary pulmonary nodule: Secondary | ICD-10-CM

## 2018-12-14 DIAGNOSIS — K869 Disease of pancreas, unspecified: Secondary | ICD-10-CM

## 2018-12-14 MED ORDER — GADOBENATE DIMEGLUMINE 529 MG/ML IV SOLN
7.0000 mL | Freq: Once | INTRAVENOUS | Status: AC | PRN
  Administered 2018-12-14: 7 mL via INTRAVENOUS

## 2018-12-14 NOTE — Telephone Encounter (Signed)
Please call regarding possible medication interactions. Patient states he had a note about this but cannot find it.

## 2018-12-15 NOTE — Telephone Encounter (Signed)
Left message for patient to return call.

## 2018-12-18 NOTE — Telephone Encounter (Signed)
Patient states he thought he might have been having trouble sleeping due to entresto; however, he does not feel that was the cause and he is sleeping better now. Patient wanted to know what medications cannot be taken with entresto because he has misplaced that information at home. Advised patient that ace inhibitors are to be avoided when taking entresto. Confirmed that patient is not taking any ace inhibitors at this time. No further questions.

## 2018-12-24 ENCOUNTER — Ambulatory Visit: Payer: Medicare Other | Admitting: Cardiology

## 2018-12-29 ENCOUNTER — Telehealth: Payer: Self-pay | Admitting: Cardiology

## 2018-12-29 NOTE — Telephone Encounter (Signed)
Patient is currently taking Eloquis and it is getting too expensive, is there something else he can change to?

## 2018-12-30 NOTE — Telephone Encounter (Signed)
Patient agreeable to picking up samples of eliquis 2.5 mg in the [facility] office and filling out patient assistance paperwork then as well. Patient will bring proof of income to attach to the application for processing. Cherylann Parr, CMA has been notified of plan and will sign out samples for patient pick up. Patient assistance application has been faxed to the [facility] office for patient to fill out and will be faxed back to [facility] when completed.

## 2019-01-01 ENCOUNTER — Other Ambulatory Visit: Payer: Self-pay | Admitting: *Deleted

## 2019-01-01 MED ORDER — APIXABAN 2.5 MG PO TABS: 2.5000 mg | ORAL_TABLET | Freq: Two times a day (BID) | ORAL | 3 refills | Status: DC

## 2019-01-04 ENCOUNTER — Other Ambulatory Visit: Payer: Self-pay | Admitting: Cardiology

## 2019-01-04 MED ORDER — SACUBITRIL-VALSARTAN 24-26 MG PO TABS: 1.0000 | ORAL_TABLET | Freq: Two times a day (BID) | ORAL | 0 refills | Status: DC

## 2019-01-04 NOTE — Addendum Note (Signed)
Addended by: Ashok Norris on: 01/04/2019 12:10 PM   Modules accepted: Orders

## 2019-01-04 NOTE — Telephone Encounter (Signed)
Entresto 24-26 twice daily refilled

## 2019-01-04 NOTE — Telephone Encounter (Signed)
° °  1. Which medications need to be refilled? (please list name of each medication and dose if known) Entresto 24-26mg   2. Which pharmacy/location (including street and city if local pharmacy) is medication to be sent to? [facility]  3. Do they need a 30 day or 90 day supply? [place]

## 2019-01-06 ENCOUNTER — Telehealth: Payer: Self-pay | Admitting: Cardiology

## 2019-01-06 NOTE — Telephone Encounter (Signed)
Patient went thru his mail order pharmacy [facility], for [facility] and it is still $531. Is there an alternative?

## 2019-01-07 NOTE — Telephone Encounter (Signed)
Spoke with patient regarding the price of entresto. Patient is unaware why the price is so high at this time. Advised patient to contact the pharmacy and find out if the price will decrease once he has met his deductible and to call me back with an update. Patient verbalized understanding. We will formulate a plan when he has more information. No further questions.

## 2019-01-08 ENCOUNTER — Ambulatory Visit: Payer: Medicare Other | Admitting: Cardiology

## 2019-01-24 NOTE — Progress Notes (Signed)
Cardiology Office Note:    Date:  01/25/2019   ID:  [name], DOB 10-09-1925, MRN [id number]  PCP:  Leonard Downing, MD  Cardiologist:  Shirlee More, MD    Referring MD: Leonard Downing, *    ASSESSMENT:    1. Hypertensive heart disease with heart failure ([place])   2. Chronic systolic (congestive) heart failure ([place])   3. CKD (chronic kidney disease) stage 3, GFR 30-59 ml/min (HCC)   4. Coronary artery disease involving native coronary artery of native heart with angina pectoris (HCC)   5. Paroxysmal atrial fibrillation ([place])   6. Chronic anticoagulation   7. Weakness of lower extremity, unspecified laterality   8. Gait difficulty   9. Bilateral leg weakness    PLAN:    In order of problems listed above:  1. Improved he has no fluid overload New York Heart Association class I we will uptitrate Entresto recheck renal function and proBNP.  Very concerned about noncompliance and hopefully his daughter can intervene 2. See above continue diuretic low-dose beta-blocker bradycardia and uptitrate Entresto.  I would not place him on MRA with his CKD 3. Stable recheck renal function potassium at risk for hyperkalemia 4. Stable CAD continue current medical treatment 5. Stable clinically continue his anticoagulant and low-dose beta-blocker 6. No bleeding complications continue his anticoagulant 7. His bilateral lower leg weakness and gait dysfunction refer to physical therapy especially important with anticoagulation to avoid falls and traumatic hemorrhage   Next appointment: 3 months   Medication Adjustments/Labs and Tests Ordered: Current medicines are reviewed at length with the patient today.  Concerns regarding medicines are outlined above.  Orders Placed This Encounter  Procedures  . TSH  . Basic metabolic panel  . Pro b natriuretic peptide (BNP)  . Ambulatory referral to Physical Therapy   Meds ordered this encounter  Medications  . DISCONTD:  sacubitril-valsartan (ENTRESTO) 49-51 MG    Sig: Take 1 tablet by mouth 2 (two) times daily.    Dispense:  180 tablet    Refill:  0  . sacubitril-valsartan (ENTRESTO) 49-51 MG    Sig: Take 1 tablet by mouth 2 (two) times daily.    Dispense:  60 tablet    Refill:  0    Please Honor Card patient is presenting for [name]: [id number]; [name]: [id number]; NATFT: [id number]; ISSUER: [id number] ID: [id number]    Chief Complaint  Patient presents with  . Follow-up  . Congestive Heart Failure    History of Present Illness:    [name] is a 85 y.o. male with a hx of CAD with PCI in 2002 and 2003, SVT, sinus bradycardia, hyperlipidemia and heart failure  EF 30% in Dec 2016 with moderate AR, MR and TR and stage 3 CKD in 2017. [name] He underwent left heart catheterization 01/20/2017 ejection fraction 25% patent stent in the left anterior descending coronary artery and had no further revascularization and he had stage III CKD with a GFR 31 cc/min. He was last seen 10/02/18, despite compensated heart failure the  proBNP level remains severely elevated greater than 4000.  He was transitioned from ACE to [facility]. Compliance with diet, lifestyle and medications: No,, he is adding salt to his diet does not weigh at home and we will not use a pillbox.  Quite concerned about his noncompliance and discussed both of these details with the patient and his daughter and I asked her to intervene to address this.  He is not short of breath  no edema orthopnea chest pain palpitations syncope or bleeding complications anticoagulant.  Unfortunately is increasingly weak he has joint pain has trouble ambulating cannot climb stairs and the family request is appropriate referral for physical therapy evaluation.  I do not think this problem is claudication. Past Medical History:  Diagnosis Date  . BPH (benign prostatic hyperplasia)   . Chronic sinus bradycardia   . Chronic systolic (congestive) heart failure ([place])   . CKD (chronic  kidney disease), stage III ([place])   . Coronary artery disease   . Diverticulosis   . GERD (gastroesophageal reflux disease)   . Hypercholesterolemia   . Hypertension   . IBS (irritable bowel syndrome)   . Ischemic cardiomyopathy 09/21/2018  . Kidney stone   . Left hip pain    Chronic left hip discomfort  . Myocardial infarction [facility]) 2001   Previous anterolateral myocardial infarction with presistent ST-T wave changes  . Osteoarthritis   . Sleep apnea    uses cpap  . SVT (supraventricular tachycardia) ([place])   . Tubular adenoma     Past Surgical History:  Procedure Laterality Date  . ANGIOPLASTY  2002   with stent and angioplasty in 2003  . APPENDECTOMY    . BACK SURGERY    . CARDIAC CATHETERIZATION  05/07/2002   Ejection fraction 20%  . CARPAL TUNNEL RELEASE Left 08/29/2016   Procedure: LEFT CARPAL TUNNEL RELEASE;  Surgeon: [name], MD;  Location: [facility];  Service: Orthopedics;  Laterality: Left;  REG/FAB  . FINGER SURGERY    . HIP CLOSED REDUCTION  01/31/2012   Procedure: CLOSED MANIPULATION HIP;  Surgeon: [name], MD;  Location: [facility];  Service: Orthopedics;  Laterality: Right;  closed reduction right dislocated hip  . HIP CLOSED REDUCTION  10/10/2012   Procedure: CLOSED MANIPULATION HIP;  Surgeon: [name], MD;  Location: [facility];  Service: Orthopedics;  Laterality: Right;  . HIP CLOSED REDUCTION Right 03/17/2015   Procedure: CLOSED REDUCTION HIP;  Surgeon: Rod Can, MD;  Location: [facility];  Service: Orthopedics;  Laterality: Right;  . JOINT REPLACEMENT     BILATERAL HIP REPLACEMENTS  . TONSILLECTOMY    . TOTAL HIP REVISION Right 09/06/2015   Procedure: RIGHT TOTAL HIP ACETABULAR LINER REVISION (CONSTRAINED LINER);  Surgeon: [name], MD;  Location: [facility];  Service: Orthopedics;  Laterality: Right;  . TRANSURETHRAL RESECTION OF PROSTATE      Current Medications: Current Meds  Medication Sig  . apixaban (ELIQUIS) 2.5 MG TABS  tablet Take 1 tablet (2.5 mg total) by mouth 2 (two) times daily.  [name] donepezil (ARICEPT) 10 MG tablet Take 10 mg by mouth at bedtime.  . ferrous sulfate 325 (65 FE) MG tablet Take 325 mg by mouth 2 (two) times daily with a meal.   . furosemide (LASIX) 20 MG tablet Take 20 mg by mouth daily.   . Metoprolol Succinate 25 MG CS24 Take 1 tablet by mouth daily.  . pravastatin (PRAVACHOL) 20 MG tablet Take 1 tablet (20 mg total) by mouth every evening.  . terazosin (HYTRIN) 10 MG capsule Take 1 capsule (10 mg total) by mouth at bedtime.  . [DISCONTINUED] sacubitril-valsartan (ENTRESTO) 24-26 MG Take 1 tablet by mouth 2 (two) times daily.     Allergies:   Beef-derived products; Acetaminophen; Amiodarone; Beef extract; Prilosec [omeprazole]; and Prednisone   Social History   Socioeconomic History  . Marital status: Married    Spouse name: Not on file  . Number of children:  Not on file  . Years of education: Not on file  . Highest education level: Not on file  Occupational History  . Not on file  Social Needs  . Financial resource strain: Not on file  . Food insecurity:    Worry: Not on file    Inability: Not on file  . Transportation needs:    Medical: Not on file    Non-medical: Not on file  Tobacco Use  . Smoking status: Never Smoker  . Smokeless tobacco: Never Used  Substance and Sexual Activity  . Alcohol use: No  . Drug use: No  . Sexual activity: Never  Lifestyle  . Physical activity:    Days per week: Not on file    Minutes per session: Not on file  . Stress: Not on file  Relationships  . Social connections:    Talks on phone: Not on file    Gets together: Not on file    Attends religious service: Not on file    Active member of club or organization: Not on file    Attends meetings of clubs or organizations: Not on file    Relationship status: Not on file  Other Topics Concern  . Not on file  Social History Narrative  . Not on file     Family History: The  patient's family history includes Alzheimer's disease in his mother; Cancer in his sister; Heart attack in his father; Stroke in his father and mother. ROS:   Please see the history of present illness.    All other systems reviewed and are negative.  EKGs/Labs/Other Studies Reviewed:    The following studies were reviewed today:   Recent Labs: 10/02/2018: ALT 15; BUN 46; Creatinine, Ser 1.88; Hemoglobin 12.9; NT-Pro BNP 4,298; Platelets 176; Potassium 4.4; Sodium 140  Recent Lipid Panel    Component Value Date/Time   CHOL 223 (H) 10/02/2018 1110   TRIG 143 10/02/2018 1110   HDL 46 10/02/2018 1110   CHOLHDL 4.8 10/02/2018 1110   CHOLHDL 2.5 01/15/2016 1104   VLDL 13 01/15/2016 1104   LDLCALC 148 (H) 10/02/2018 1110    Physical Exam:    VS:  BP 128/70 (BP Location: Right Arm, Patient Position: Sitting, Cuff Size: Normal)   Pulse 84   Ht 5\' 7"  (1.702 m)   Wt 165 lb (74.8 kg)   SpO2 95%   BMI 25.84 kg/m     Wt Readings from Last 3 Encounters:  01/25/19 165 lb (74.8 kg)  10/02/18 159 lb 6.4 oz (72.3 kg)  08/29/16 157 lb 4 oz (71.3 kg)     GEN:  Well nourished, well developed in no acute distress HEENT: Normal NECK: No JVD; No carotid bruits LYMPHATICS: No lymphadenopathy CARDIAC: RRR, no murmurs, rubs, gallops RESPIRATORY:  Clear to auscultation without rales, wheezing or rhonchi  ABDOMEN: Soft, non-tender, non-distended MUSCULOSKELETAL:  No edema; No deformity  SKIN: Warm and dry NEUROLOGIC:  Alert and oriented x 3 PSYCHIATRIC:  Normal affect    Signed, Shirlee More, MD  01/25/2019 10:10 AM    [place]

## 2019-01-25 ENCOUNTER — Ambulatory Visit (INDEPENDENT_AMBULATORY_CARE_PROVIDER_SITE_OTHER): Payer: Medicare Other | Admitting: Cardiology

## 2019-01-25 ENCOUNTER — Encounter

## 2019-01-25 ENCOUNTER — Encounter: Payer: Self-pay | Admitting: Cardiology

## 2019-01-25 VITALS — BP 128/70 | HR 84 | Ht 67.0 in | Wt 165.0 lb

## 2019-01-25 DIAGNOSIS — R29898 Other symptoms and signs involving the musculoskeletal system: Secondary | ICD-10-CM

## 2019-01-25 DIAGNOSIS — I25119 Atherosclerotic heart disease of native coronary artery with unspecified angina pectoris: Secondary | ICD-10-CM

## 2019-01-25 DIAGNOSIS — I110 Hypertensive heart disease with heart failure: Secondary | ICD-10-CM | POA: Diagnosis not present

## 2019-01-25 DIAGNOSIS — I5022 Chronic systolic (congestive) heart failure: Secondary | ICD-10-CM

## 2019-01-25 DIAGNOSIS — N183 Chronic kidney disease, stage 3 unspecified: Secondary | ICD-10-CM

## 2019-01-25 DIAGNOSIS — R269 Unspecified abnormalities of gait and mobility: Secondary | ICD-10-CM

## 2019-01-25 DIAGNOSIS — I480 Paroxysmal atrial fibrillation: Secondary | ICD-10-CM

## 2019-01-25 DIAGNOSIS — Z7901 Long term (current) use of anticoagulants: Secondary | ICD-10-CM

## 2019-01-25 HISTORY — DX: Other symptoms and signs involving the musculoskeletal system: R29.898

## 2019-01-25 MED ORDER — SACUBITRIL-VALSARTAN 49-51 MG PO TABS: 1.0000 | ORAL_TABLET | Freq: Two times a day (BID) | ORAL | 0 refills | Status: DC

## 2019-01-25 NOTE — Patient Instructions (Addendum)
Medication Instructions:  Your physician has recommended you make the following change in your medication:   Take furosemide in the mornings as instructed by Dr Bettina Gavia  INCREASE:  Delene Loll 49/51mg  one tablet twice daily.  You have been referred to physical therapy.  Someone will call you with an appointment date and time.  If you have not heard anything in 1 - 2 weeks please contact our office.  If you need a refill on your cardiac medications before your next appointment, please call your pharmacy.   Lab work: You will have lab work today:  TSH, ProBNP, and BMP If you have labs (blood work) drawn today and your tests are completely normal, you will receive your results only by: [name] MyChart Message (if you have MyChart) OR . A paper copy in the mail If you have any lab test that is abnormal or we need to change your treatment, we will call you to review the results.  Testing/Procedures: NONE  Follow-Up: At [facility], you and your health needs are our priority.  As part of our continuing mission to provide you with exceptional heart care, we have created designated Provider Care Teams.  These Care Teams include your primary Cardiologist (physician) and Advanced Practice Providers (APPs -  Physician Assistants and Nurse Practitioners) who all work together to provide you with the care you need, when you need it. You will need a follow up appointment in 3 months.  Please call our office 2 months in advance to schedule this appointment.

## 2019-01-26 LAB — BASIC METABOLIC PANEL
BUN/Creatinine Ratio: 18 (ref 10–24)
BUN: 30 mg/dL (ref 10–36)
CO2: 23 mmol/L (ref 20–29)
Calcium: 9.6 mg/dL (ref 8.6–10.2)
Chloride: 103 mmol/L (ref 96–106)
Creatinine, Ser: 1.69 mg/dL — ABNORMAL HIGH (ref 0.76–1.27)
GFR calc Af Amer: 40 mL/min/{1.73_m2} — ABNORMAL LOW (ref >59)
GFR calc non Af Amer: 35 mL/min/{1.73_m2} — ABNORMAL LOW (ref >59)
Glucose: 107 mg/dL — ABNORMAL HIGH (ref 65–99)
Potassium: 4.6 mmol/L (ref 3.5–5.2)
Sodium: 141 mmol/L (ref 134–144)

## 2019-01-26 LAB — TSH: TSH: 6.65 u[IU]/mL — ABNORMAL HIGH (ref 0.450–4.500)

## 2019-01-26 LAB — PRO B NATRIURETIC PEPTIDE: NT-Pro BNP: 4702 pg/mL — ABNORMAL HIGH (ref 0–486)

## 2019-02-03 ENCOUNTER — Ambulatory Visit: Payer: Medicare Other | Admitting: Physical Therapy

## 2019-02-04 ENCOUNTER — Other Ambulatory Visit: Payer: Self-pay | Admitting: Cardiology

## 2019-02-08 ENCOUNTER — Telehealth: Payer: Self-pay | Admitting: Cardiology

## 2019-02-08 MED ORDER — FUROSEMIDE 20 MG PO TABS: 20.0000 mg | ORAL_TABLET | Freq: Every day | ORAL | 3 refills | Status: DC

## 2019-02-08 NOTE — Telephone Encounter (Signed)
Patient had questions about dosage of furosemide. He was instructed to take (1 tab) 20 mg daily but [facility]ill [facility] back in Dec.2019 for 80 mg tablets. RN instructed patient to discard 80 mg tablets and new rx sent to [place] for correct medication and dosage.

## 2019-02-08 NOTE — Telephone Encounter (Signed)
Patient states his prescription bottle of furosemide is 80mg  tablets, I only see 20mg  prescribed. Please advise.

## 2019-02-10 ENCOUNTER — Telehealth: Payer: Self-pay | Admitting: *Deleted

## 2019-02-10 NOTE — Telephone Encounter (Signed)
Faxed blood work results to Dr. Arelia Sneddon office from 01/25/19 and 10/02/18 per their request.

## 2019-02-11 ENCOUNTER — Telehealth: Payer: Self-pay | Admitting: Cardiology

## 2019-02-11 NOTE — Telephone Encounter (Signed)
Patient states he is supposed to take 49mg -51mg  of Entresto. This was changed from last visit. He still has some of the 24-26mg  left, can he take two of those instead of wasting them? Please advise.

## 2019-02-11 NOTE — Telephone Encounter (Signed)
Patient informed that he can take two 24-26 mg tablets twice daily until he runs out of that dosage. Patient will only take one 49-51 mg tablet twice daily when he picks up his new prescription. Patient verbalized understanding. No further questions.

## 2019-02-15 ENCOUNTER — Other Ambulatory Visit: Payer: Self-pay | Admitting: Cardiology

## 2019-02-25 ENCOUNTER — Telehealth: Payer: Self-pay | Admitting: *Deleted

## 2019-02-25 MED ORDER — PRAVASTATIN SODIUM 20 MG PO TABS: 20.0000 mg | ORAL_TABLET | Freq: Every evening | ORAL | 3 refills | Status: DC

## 2019-02-25 NOTE — Telephone Encounter (Signed)
*  STAT* If patient is at the pharmacy, call can be transferred to refill team.   1. Which medications need to be refilled? (please list name of each medication and dose if known) Pravastatin 20 mg qd 2. Which pharmacy/location (including street and city if local pharmacy) is medication to be sent to?[place]  3. Do they need a 30 day or 90 day supply? [place]

## 2019-03-05 ENCOUNTER — Telehealth: Payer: Self-pay

## 2019-03-05 NOTE — Telephone Encounter (Signed)
Patient and his daughter aware that Eliquis has been denied by [place] due to he has not reached his out of pocket deductible of $552.08.  Patient advised get copies of receipts from pharmacy showing how much he has paid out-of-pocket and once he reaches his deductible he can fax his receipts to [employer] for them to review. Patient's daughter aware that they should fax receipts on their own as we are minimizing the amount of outsiders in the office due to Covid-19.  Patient and his daughter agreed to plan and verbalized understanding.  They will contact our office with any questions or concerns.

## 2019-03-08 ENCOUNTER — Telehealth: Payer: Self-pay | Admitting: Cardiology

## 2019-03-08 NOTE — Telephone Encounter (Signed)
Patient aware that he will need to mail in a copy of his out of pocket expense report to our [employer] location, and we will fax it to [employer] Patient Assistance.  Patient agreed to plan and verbalized understanding.

## 2019-03-08 NOTE — Telephone Encounter (Signed)
Patient called to let you know he spent $560.71 on his medication for out of pocket. He said you needed info for patient assistance.

## 2019-04-06 ENCOUNTER — Telehealth: Payer: Self-pay | Admitting: Cardiology

## 2019-04-06 NOTE — Telephone Encounter (Signed)
Patient advised that he should continue taking Entresto as instructed per Dr Bettina Gavia.  Patient advised that we will mail him FREE 30 day supply [employer] card and patient assistance paper work.  Langley Gauss will be mailing this today.  Patient will complete his portion of paper work and will mail back to office for Dr Bettina Gavia to sign.  Patient advised to call our office to notify us once he has put his paper work in the mail.  Patient agreed to plan and verbalized understanding.

## 2019-04-06 NOTE — Telephone Encounter (Signed)
[name] 1925-04-28 wants to know if Dr Bettina Gavia wants him to stay on Entresto, or can he cut back on what he takes (he takes 2 a day right now) because it is so expensive

## 2019-04-16 ENCOUNTER — Telehealth: Payer: Self-pay | Admitting: *Deleted

## 2019-04-16 NOTE — Telephone Encounter (Signed)
Pt wanted [place] to know is mailing the entresto form he filled out to office for Dr. Bettina Gavia to sign.

## 2019-04-28 ENCOUNTER — Telehealth: Payer: Self-pay | Admitting: Cardiology

## 2019-04-28 NOTE — Telephone Encounter (Signed)
Virtual Visit Pre-Appointment Phone Call  "(Name), I am calling you today to discuss your upcoming appointment. We are currently trying to limit exposure to the virus that causes COVID-19 by seeing patients at home rather than in the office."  1. "What is the BEST phone number to call the day of the visit?" - include this in appointment notes  2. Do you have or have access to (through a family member/friend) a smartphone with video capability that we can use for your visit?" a. If yes - list this number in appt notes as cell (if different from BEST phone #) and list the appointment type as a VIDEO visit in appointment notes b. If no - list the appointment type as a PHONE visit in appointment notes  3. Confirm consent - "In the setting of the current Covid19 crisis, you are scheduled for a (phone or video) visit with your provider on (date) at (time).  Just as we do with many in-office visits, in order for you to participate in this visit, we must obtain consent.  If you'd like, I can send this to your mychart (if signed up) or email for you to review.  Otherwise, I can obtain your verbal consent now.  All virtual visits are billed to your insurance company just like a normal visit would be.  By agreeing to a virtual visit, we'd like you to understand that the technology does not allow for your provider to perform an examination, and thus may limit your provider's ability to fully assess your condition. If your provider identifies any concerns that need to be evaluated in person, we will make arrangements to do so.  Finally, though the technology is pretty good, we cannot assure that it will always work on either your or our end, and in the setting of a video visit, we may have to convert it to a phone-only visit.  In either situation, we cannot ensure that we have a secure connection.  Are you willing to proceed?" STAFF: Did the patient verbally acknowledge consent to telehealth visit? Document  YES/NO here: Yes  4. Advise patient to be prepared - "Two hours prior to your appointment, go ahead and check your blood pressure, pulse, oxygen saturation, and your weight (if you have the equipment to check those) and write them all down. When your visit starts, your provider will ask you for this information. If you have an Apple Watch or Kardia device, please plan to have heart rate information ready on the day of your appointment. Please have a pen and paper handy nearby the day of the visit as well."  5. Give patient instructions for MyChart download to smartphone OR Doximity/Doxy.me as below if video visit (depending on what platform provider is using)  6. Inform patient they will receive a phone call 15 minutes prior to their appointment time (may be from unknown caller ID) so they should be prepared to answer    TELEPHONE CALL NOTE  [name] has been deemed a candidate for a follow-up tele-health visit to limit community exposure during the Covid-19 pandemic. I spoke with the patient via phone to ensure availability of phone/video source, confirm preferred email & phone number, and discuss instructions and expectations.  I reminded [name] to be prepared with any vital sign and/or heart rhythm information that could potentially be obtained via home monitoring, at the time of his visit. I reminded [name] to expect a phone call prior to  his visit.  [name] 04/28/2019 4:01 PM   INSTRUCTIONS FOR DOWNLOADING THE MYCHART APP TO SMARTPHONE  - The patient must first make sure to have activated MyChart and know their login information - If Apple, go to [employer] and type in MyChart in the search bar and download the app. If Android, ask patient to go to [employer] and type in [place] in the search bar and download the app. The app is free but as with any other app downloads, their phone may require them to verify saved payment information or Apple/Android  password.  - The patient will need to then log into the app with their MyChart username and password, and select [facility] as their healthcare provider to link the account. When it is time for your visit, go to the MyChart app, find appointments, and click Begin Video Visit. Be sure to Select Allow for your device to access the Microphone and Camera for your visit. You will then be connected, and your provider will be with you shortly.  **If they have any issues connecting, or need assistance please contact MyChart service desk [phone])**  **If using a computer, in order to ensure the best quality for their visit they will need to use either of the following Internet Browsers: [employer], or Google Chrome**  IF USING DOXIMITY or DOXY.ME - The patient will receive a link just prior to their visit by text.     FULL LENGTH CONSENT FOR TELE-HEALTH VISIT   I hereby voluntarily request, consent and authorize [facility] and its employed or contracted physicians, physician assistants, nurse practitioners or other licensed health care professionals (the Practitioner), to provide me with telemedicine health care services (the Services") as deemed necessary by the treating Practitioner. I acknowledge and consent to receive the Services by the Practitioner via telemedicine. I understand that the telemedicine visit will involve communicating with the Practitioner through live audiovisual communication technology and the disclosure of certain medical information by electronic transmission. I acknowledge that I have been given the opportunity to request an in-person assessment or other available alternative prior to the telemedicine visit and am voluntarily participating in the telemedicine visit.  I understand that I have the right to withhold or withdraw my consent to the use of telemedicine in the course of my care at any time, without affecting my right to future care or treatment,  and that the Practitioner or I may terminate the telemedicine visit at any time. I understand that I have the right to inspect all information obtained and/or recorded in the course of the telemedicine visit and may receive copies of available information for a reasonable fee.  I understand that some of the potential risks of receiving the Services via telemedicine include:   Delay or interruption in medical evaluation due to technological equipment failure or disruption;  Information transmitted may not be sufficient (e.g. poor resolution of images) to allow for appropriate medical decision making by the Practitioner; and/or   In rare instances, security protocols could fail, causing a breach of personal health information.  Furthermore, I acknowledge that it is my responsibility to provide information about my medical history, conditions and care that is complete and accurate to the best of my ability. I acknowledge that Practitioner's advice, recommendations, and/or decision may be based on factors not within their control, such as incomplete or inaccurate data provided by me or distortions of diagnostic images or specimens that may result from electronic transmissions. I  understand that the practice of medicine is not an exact science and that Practitioner makes no warranties or guarantees regarding treatment outcomes. I acknowledge that I will receive a copy of this consent concurrently upon execution via email to the email address I last provided but may also request a printed copy by calling the office of [facility].    I understand that my insurance will be billed for this visit.   I have read or had this consent read to me.  I understand the contents of this consent, which adequately explains the benefits and risks of the Services being provided via telemedicine.   I have been provided ample opportunity to ask questions regarding this consent and the Services and have had my questions  answered to my satisfaction.  I give my informed consent for the services to be provided through the use of telemedicine in my medical care  By participating in this telemedicine visit I agree to the above.

## 2019-05-03 ENCOUNTER — Telehealth (INDEPENDENT_AMBULATORY_CARE_PROVIDER_SITE_OTHER): Payer: Medicare Other | Admitting: Cardiology

## 2019-05-03 ENCOUNTER — Encounter: Payer: Self-pay | Admitting: Cardiology

## 2019-05-03 ENCOUNTER — Other Ambulatory Visit: Payer: Self-pay

## 2019-05-03 VITALS — BP 111/69 | HR 77 | Temp 97.6°F | Ht 66.0 in | Wt 161.0 lb

## 2019-05-03 DIAGNOSIS — I5022 Chronic systolic (congestive) heart failure: Secondary | ICD-10-CM

## 2019-05-03 DIAGNOSIS — I25119 Atherosclerotic heart disease of native coronary artery with unspecified angina pectoris: Secondary | ICD-10-CM

## 2019-05-03 DIAGNOSIS — Z7901 Long term (current) use of anticoagulants: Secondary | ICD-10-CM

## 2019-05-03 DIAGNOSIS — E785 Hyperlipidemia, unspecified: Secondary | ICD-10-CM

## 2019-05-03 DIAGNOSIS — I480 Paroxysmal atrial fibrillation: Secondary | ICD-10-CM

## 2019-05-03 DIAGNOSIS — I110 Hypertensive heart disease with heart failure: Secondary | ICD-10-CM

## 2019-05-03 DIAGNOSIS — N183 Chronic kidney disease, stage 3 unspecified: Secondary | ICD-10-CM

## 2019-05-03 MED ORDER — SACUBITRIL-VALSARTAN 49-51 MG PO TABS: 1.0000 | ORAL_TABLET | Freq: Two times a day (BID) | ORAL | 2 refills | Status: DC

## 2019-05-03 NOTE — Patient Instructions (Addendum)
Medication Instructions:  Your physician recommends that you continue on your current medications as directed. Please refer to the Current Medication list given to you today.  If you need a refill on your cardiac medications before your next appointment, please call your pharmacy.   Lab work: Your physician recommends that you return for lab work in the next 2-3 days: CMP, lipid panel, ProBNP. Please return to our [facility] office for lab work, no appointment needed. Please fast beforehand.  If you have labs (blood work) drawn today and your tests are completely normal, you will receive your results only by: [name] MyChart Message (if you have MyChart) OR . A paper copy in the mail If you have any lab test that is abnormal or we need to change your treatment, we will call you to review the results.  Testing/Procedures: None  Follow-Up: At [facility], you and your health needs are our priority.  As part of our continuing mission to provide you with exceptional heart care, we have created designated Provider Care Teams.  These Care Teams include your primary Cardiologist (physician) and Advanced Practice Providers (APPs -  Physician Assistants and Nurse Practitioners) who all work together to provide you with the care you need, when you need it. You will need a follow up appointment in 3 months: Wednesday, 08/04/2019, at 9:00 am in the [facility] office.

## 2019-05-03 NOTE — Progress Notes (Signed)
Virtual Visit via Telephone Note   This visit type was conducted due to national recommendations for restrictions regarding the COVID-19 Pandemic (e.g. social distancing) in an effort to limit this patient's exposure and mitigate transmission in our community.  Due to his co-morbid illnesses, this patient is at least at moderate risk for complications without adequate follow up.  This format is felt to be most appropriate for this patient at this time.  The patient did not have access to video technology/had technical difficulties with video requiring transitioning to audio format only (telephone).  All issues noted in this document were discussed and addressed.  No physical exam could be performed with this format.  Please refer to the patient's chart for his  consent to telehealth for [facility].   Date:  05/03/2019   ID:  [name], DOB 23-Nov-1925, MRN [id number]  Patient Location: Home Provider Location: Office  PCP:  Leonard Downing, MD  Cardiologist:  No primary care provider on file. Dr Bettina Gavia Electrophysiologist:  None   Evaluation Performed:  Follow-Up Visit  Chief Complaint:  Heart failure  History of Present Illness:    [name] is a 85 y.o. male with  a hx of CAD with PCI in 2002 and 2003, SVT, sinus bradycardia, hyperlipidemia and heart failure  EF 30% in Dec 2016 with moderate AR, MR and TR and stage 3 CKD in 2017. [name] He underwent left heart catheterization 01/20/2017 ejection fraction 25% patent stent in the left anterior descending coronary artery and had no further revascularization and he had stage III CKD with a GFR 31 cc/min. He was last seen 01/25/19   The patient does not have symptoms concerning for COVID-19 infection (fever, chills, cough, or new shortness of breath).   Overall doing well he finds himself a bit tired in the morning after the first dose of Entresto but not having edema shortness of breath orthopnea palpitation chest pain or  TIA.  He takes reduced dose anticoagulation with age renal dysfunction and has had no bleeding complication.  His biggest problem is frequent nocturia he is already taking an alpha-blocker and he will plan to see urology after regression of COVID-19. Past Medical History:  Diagnosis Date  . BPH (benign prostatic hyperplasia)   . Chronic sinus bradycardia   . Chronic systolic (congestive) heart failure ([place])   . CKD (chronic kidney disease), stage III ([place])   . Coronary artery disease   . Diverticulosis   . GERD (gastroesophageal reflux disease)   . Hypercholesterolemia   . Hypertension   . IBS (irritable bowel syndrome)   . Ischemic cardiomyopathy 09/21/2018  . Kidney stone   . Left hip pain    Chronic left hip discomfort  . Myocardial infarction [facility]) 2001   Previous anterolateral myocardial infarction with presistent ST-T wave changes  . Osteoarthritis   . Sleep apnea    uses cpap  . SVT (supraventricular tachycardia) ([place])   . Tubular adenoma    Past Surgical History:  Procedure Laterality Date  . ANGIOPLASTY  2002   with stent and angioplasty in 2003  . APPENDECTOMY    . BACK SURGERY    . CARDIAC CATHETERIZATION  05/07/2002   Ejection fraction 20%  . CARPAL TUNNEL RELEASE Left 08/29/2016   Procedure: LEFT CARPAL TUNNEL RELEASE;  Surgeon: [name], MD;  Location: [facility];  Service: Orthopedics;  Laterality: Left;  REG/FAB  . FINGER SURGERY    . HIP CLOSED REDUCTION  01/31/2012   Procedure: CLOSED MANIPULATION HIP;  Surgeon: [name], MD;  Location: [facility];  Service: Orthopedics;  Laterality: Right;  closed reduction right dislocated hip  . HIP CLOSED REDUCTION  10/10/2012   Procedure: CLOSED MANIPULATION HIP;  Surgeon: [name], MD;  Location: [facility];  Service: Orthopedics;  Laterality: Right;  . HIP CLOSED REDUCTION Right 03/17/2015   Procedure: CLOSED REDUCTION HIP;  Surgeon: Rod Can, MD;  Location: [facility];  Service: Orthopedics;   Laterality: Right;  . JOINT REPLACEMENT     BILATERAL HIP REPLACEMENTS  . TONSILLECTOMY    . TOTAL HIP REVISION Right 09/06/2015   Procedure: RIGHT TOTAL HIP ACETABULAR LINER REVISION (CONSTRAINED LINER);  Surgeon: [name], MD;  Location: [facility];  Service: Orthopedics;  Laterality: Right;  . TRANSURETHRAL RESECTION OF PROSTATE       Current Meds  Medication Sig  . apixaban (ELIQUIS) 2.5 MG TABS tablet Take 1 tablet (2.5 mg total) by mouth 2 (two) times daily.  [name] donepezil (ARICEPT) 10 MG tablet Take 10 mg by mouth at bedtime.  [name] ENTRESTO 49-51 MG TAKE 1 TABLET BY MOUTH TWICE DAILY  . ferrous sulfate 325 (65 FE) MG tablet Take 325 mg by mouth 2 (two) times daily with a meal.   . furosemide (LASIX) 20 MG tablet Take 1 tablet (20 mg total) by mouth daily.  . Metoprolol Succinate 25 MG CS24 Take 1 tablet by mouth daily.  . pravastatin (PRAVACHOL) 20 MG tablet Take 1 tablet (20 mg total) by mouth every evening.  . terazosin (HYTRIN) 10 MG capsule Take 1 capsule (10 mg total) by mouth at bedtime.     Allergies:   Beef-derived products; Acetaminophen; Amiodarone; Prilosec [omeprazole]; and Prednisone   Social History   Tobacco Use  . Smoking status: Never Smoker  . Smokeless tobacco: Never Used  Substance Use Topics  . Alcohol use: No  . Drug use: No     Family Hx: The patient's family history includes Alzheimer's disease in his mother; Cancer in his sister; Heart attack in his father; Stroke in his father and mother.  ROS:   Please see the history of present illness.     All other systems reviewed and are negative.   Prior CV studies:   The following studies were reviewed today:    Labs/Other Tests and Data Reviewed:    EKG:  No ECG reviewed.  Recent Labs: 10/02/2018: ALT 15; Hemoglobin 12.9; Platelets 176 01/25/2019: BUN 30; Creatinine, Ser 1.69; NT-Pro BNP 4,702; Potassium 4.6; Sodium 141; TSH 6.650   Recent Lipid Panel Lab Results  Component Value Date/Time    CHOL 223 (H) 10/02/2018 11:10 AM   TRIG 143 10/02/2018 11:10 AM   HDL 46 10/02/2018 11:10 AM   CHOLHDL 4.8 10/02/2018 11:10 AM   CHOLHDL 2.5 01/15/2016 11:04 AM   LDLCALC 148 (H) 10/02/2018 11:10 AM    Wt Readings from Last 3 Encounters:  05/03/19 161 lb (73 kg)  01/25/19 165 lb (74.8 kg)  10/02/18 159 lb 6.4 oz (72.3 kg)     Objective:    Vital Signs:  BP 111/69 (BP Location: Left Arm, Patient Position: Sitting)   Pulse 77   Temp 97.6 F (36.4 C)   Ht 5\' 6"  (1.676 m)   Wt 161 lb (73 kg)   BMI 25.99 kg/m    VITAL SIGNS:  reviewed mood affect thought cognition normal alert alert oriented x3 no respiratory distress or audible wheezing during conversation ASSESSMENT &  PLAN:    1. CAD, stable New York Heart Association class I continue medical therapy he is not taking a low-dose of a low intensity statin.  I do not see any indication for an ischemia evaluation at this time and need an EKG at next office visit 2. Heart failure chronic systolic compensated New York Heart Association class I ideally would like to uptitrate his Entresto but I think at this point we should hold with a midrange dose until seen face-to-face with orthostatic signs next visit.  Continue his loop diuretic beta-blocker and avoid MRA with CKD at risk for and will recheck renal function potassium 3. Hypertension stable continue current treatment and not uptitrate his Entresto 4. CKD stage III recheck renal function and potassium 5. Atrial fibrillation stable continue low-dose anticoagulation  COVID-19 Education: The signs and symptoms of COVID-19 were discussed with the patient and how to seek care for testing (follow up with PCP or arrange E-visit).  The importance of social distancing was discussed today.  Time:   Today, I have spent 25 minutes with the patient with telehealth technology discussing the above problems.     Medication Adjustments/Labs and Tests Ordered: Current medicines are reviewed at  length with the patient today.  Concerns regarding medicines are outlined above.   Tests Ordered: No orders of the defined types were placed in this encounter.   Medication Changes: No orders of the defined types were placed in this encounter.   Disposition:  Follow up in 3 month(s)  Signed, Shirlee More, MD  05/03/2019 10:09 AM    [facility]

## 2019-05-03 NOTE — Addendum Note (Signed)
Addended by: Austin Miles on: 05/03/2019 11:40 AM   Modules accepted: Orders

## 2019-05-03 NOTE — Addendum Note (Signed)
Addended by: Particia Nearing B on: 05/03/2019 10:26 AM   Modules accepted: Orders

## 2019-07-27 NOTE — Progress Notes (Signed)
Cardiology Office Note:    Date:  07/27/2019   ID:  [name], DOB 12-27-1924, MRN [id number]  PCP:  Leonard Downing, MD  Cardiologist:  Shirlee More, MD    Referring MD: Leonard Downing, *    ASSESSMENT:    1. Coronary artery disease involving native coronary artery of native heart with angina pectoris ([place])   2. 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 Hyperlipidemia, unspecified hyperlipidemia type   4. Paroxysmal atrial fibrillation (HCC)   5. Chronic anticoagulation    PLAN:    In order of problems listed above:  1. CAD - GDMT eliquis, beta blocker, statin.  Stable New York Heart Association class I continue medical treatment and I would not advise an ischemia evaluation 2. Hypertensive heart disease and kidney disease with heart failure - GDMT Entresto, beta blocker, loop diuretic. Previously unable to up titrate Entresto/beta blocker secondary to bradycardia/hypotension.  Continue current treatment I would not uptitrate them today and his daughter will watch home blood pressures and contact me if he is less than 90 or symptomatic 3. HLD - Pravastatin 20mg .  High risk continue statin check liver function lipid profile 4. PAF - Anticoagulated on Eliquis.  Stable no clinical recurrence continue his anticoagulant 5. Chronic anticoagulation - Secondary to PAF. Reduced dose Eliquis 2.5mg  BID for age/kidney function.   Next appointment: 6 months   Medication Adjustments/Labs and Tests Ordered: Current medicines are reviewed at length with the patient today.  Concerns regarding medicines are outlined above.  No orders of the defined types were placed in this encounter.  No orders of the defined types were placed in this encounter.   No chief complaint on file.   History of Present Illness:    [name] is a 85 y.o. male with a hx of CAD with PCI 2002 in 2003, atrial  fibrillation, SVT, sinus bradycardia, HLD, heart failure EF 30% in December 2016 with moderate AR, MR and TR, CKD 3 in 2017.  Most recent ischemic evaluation with a left heart cath 01/20/2017 with EF 25%, patent stent in LAD, no further revascularization CKD stage III with GFR 31.  Last seen 05/03/2019.  Compliance with diet, lifestyle and medications: Yes  From a cardiology perspective he is doing well no edema shortness of breath chest pain palpitation or syncope he finds himself more fatigued but really attributes to his severe unrelenting back and radicular pain and C-spine surgery.  I asked his daughter to pursue whether he is a candidate for pain relief modalities such as epidural or trigger point injection and she will contact that physician.  His blood pressure is borderline today but I am not can decrease his vasodilator therapy. Past Medical History:  Diagnosis Date   BPH (benign prostatic hyperplasia)    Chronic sinus bradycardia    Chronic systolic (congestive) heart failure (HCC)    CKD (chronic kidney disease), stage III (HCC)    Coronary artery disease    Diverticulosis    GERD (gastroesophageal reflux disease)    Hypercholesterolemia    Hypertension    IBS (irritable bowel syndrome)    Ischemic cardiomyopathy 09/21/2018   Kidney stone    Left hip pain    Chronic left hip discomfort   Myocardial infarction ([place]) 2001   Previous anterolateral myocardial infarction with presistent ST-T wave changes   Osteoarthritis    Sleep apnea    uses cpap  SVT (supraventricular tachycardia) ([place])    Tubular adenoma     Past Surgical History:  Procedure Laterality Date   ANGIOPLASTY  2002   with stent and angioplasty in 2003   [place]  05/07/2002   Ejection fraction 20%   CARPAL TUNNEL RELEASE Left 08/29/2016   Procedure: LEFT CARPAL TUNNEL RELEASE;  Surgeon: [name], MD;  Location: [facility];   Service: Orthopedics;  Laterality: Left;  REG/FAB   FINGER SURGERY     HIP CLOSED REDUCTION  01/31/2012   Procedure: CLOSED MANIPULATION HIP;  Surgeon: [name], MD;  Location: [facility];  Service: Orthopedics;  Laterality: Right;  closed reduction right dislocated hip   HIP CLOSED REDUCTION  10/10/2012   Procedure: CLOSED MANIPULATION HIP;  Surgeon: [name], MD;  Location: [facility];  Service: Orthopedics;  Laterality: Right;   HIP CLOSED REDUCTION Right 03/17/2015   Procedure: CLOSED REDUCTION HIP;  Surgeon: Rod Can, MD;  Location: [facility];  Service: Orthopedics;  Laterality: Right;   JOINT REPLACEMENT     BILATERAL HIP REPLACEMENTS   TONSILLECTOMY     TOTAL HIP REVISION Right 09/06/2015   Procedure: RIGHT TOTAL HIP ACETABULAR LINER REVISION (CONSTRAINED LINER);  Surgeon: [name], MD;  Location[facility];  Service: Orthopedics;  Laterality: Right;   TRANSURETHRAL RESECTION OF PROSTATE      Current Medications: No outpatient medications have been marked as taking for the 07/28/19 encounter (Appointment) with Richardo Priest, MD.     Allergies:   Beef-derived products, Acetaminophen, Amiodarone, Prilosec [omeprazole], and Prednisone   Social History   Socioeconomic History   Marital status: Married    Spouse name: Not on file   Number of children: Not on file   Years of education: Not on file   Highest education level: Not on file  Occupational History   Not on file  Social Needs   Financial resource strain: Not on file   Food insecurity    Worry: Not on file    Inability: Not on file   Transportation needs    Medical: Not on file    Non-medical: Not on file  Tobacco Use   Smoking status: Never Smoker   Smokeless tobacco: Never Used  Substance and Sexual Activity   Alcohol use: No   Drug use: No   Sexual activity: Never  Lifestyle   Physical activity    Days per week: Not on file    Minutes per session: Not on file   Stress: Not  on file  Relationships   Social connections    Talks on phone: Not on file    Gets together: Not on file    Attends religious service: Not on file    Active member of club or organization: Not on file    Attends meetings of clubs or organizations: Not on file    Relationship status: Not on file  Other Topics Concern   Not on file  Social History Narrative   Not on file     Family History: The patient's family history includes Alzheimer's disease in his mother; Cancer in his sister; Heart attack in his father; Stroke in his father and mother. ROS:   Please see the history of present illness.    All other systems reviewed and are negative.  EKGs/Labs/Other Studies Reviewed:    The following studies were reviewed today:   Recent Labs: 10/02/2018: ALT 15;  Hemoglobin 12.9; Platelets 176 01/25/2019: BUN 30; Creatinine, Ser 1.69; NT-Pro BNP 4,702; Potassium 4.6; Sodium 141; TSH 6.650  Recent Lipid Panel    Component Value Date/Time   CHOL 223 (H) 10/02/2018 1110   TRIG 143 10/02/2018 1110   HDL 46 10/02/2018 1110   CHOLHDL 4.8 10/02/2018 1110   CHOLHDL 2.5 01/15/2016 1104   VLDL 13 01/15/2016 1104   LDLCALC 148 (H) 10/02/2018 1110    Physical Exam:    VS:  There were no vitals taken for this visit.    Wt Readings from Last 3 Encounters:  05/03/19 161 lb (73 kg)  01/25/19 165 lb (74.8 kg)  10/02/18 159 lb 6.4 oz (72.3 kg)     GEN:  Well nourished, well developed in no acute distress HEENT: Normal NECK: No JVD; No carotid bruits LYMPHATICS: No lymphadenopathy CARDIAC: RRR, no murmurs, rubs, gallops RESPIRATORY:  Clear to auscultation without rales, wheezing or rhonchi  ABDOMEN: Soft, non-tender, non-distended MUSCULOSKELETAL:  No edema; No deformity  SKIN: Warm and dry NEUROLOGIC:  Alert and oriented x 3 PSYCHIATRIC:  Normal affect    Signed, Shirlee More, MD  07/27/2019 8:42 PM    [place]

## 2019-07-28 ENCOUNTER — Ambulatory Visit (INDEPENDENT_AMBULATORY_CARE_PROVIDER_SITE_OTHER): Payer: Medicare Other | Admitting: Cardiology

## 2019-07-28 ENCOUNTER — Other Ambulatory Visit: Payer: Self-pay

## 2019-07-28 VITALS — BP 92/68 | HR 88 | Ht 67.0 in | Wt 159.4 lb

## 2019-07-28 DIAGNOSIS — I130 Hypertensive heart and chronic kidney disease with heart failure and stage 1 through stage 4 chronic kidney disease, or unspecified chronic kidney disease: Secondary | ICD-10-CM

## 2019-07-28 DIAGNOSIS — I25119 Atherosclerotic heart disease of native coronary artery with unspecified angina pectoris: Secondary | ICD-10-CM

## 2019-07-28 DIAGNOSIS — I480 Paroxysmal atrial fibrillation: Secondary | ICD-10-CM

## 2019-07-28 DIAGNOSIS — N183 Chronic kidney disease, stage 3 (moderate): Secondary | ICD-10-CM

## 2019-07-28 DIAGNOSIS — I5022 Chronic systolic (congestive) heart failure: Secondary | ICD-10-CM

## 2019-07-28 DIAGNOSIS — Z7901 Long term (current) use of anticoagulants: Secondary | ICD-10-CM

## 2019-07-28 DIAGNOSIS — E785 Hyperlipidemia, unspecified: Secondary | ICD-10-CM

## 2019-07-28 NOTE — Patient Instructions (Signed)
Medication Instructions:  Your physician recommends that you continue on your current medications as directed. Please refer to the Current Medication list given to you today.  If you need a refill on your cardiac medications before your next appointment, please call your pharmacy.   Lab work: Your physician recommends that you return for lab work today: lipid panel, CMP, ProBNP.   If you have labs (blood work) drawn today and your tests are completely normal, you will receive your results only by: [name] MyChart Message (if you have MyChart) OR . A paper copy in the mail If you have any lab test that is abnormal or we need to change your treatment, we will call you to review the results.  Testing/Procedures: None  Follow-Up: At [facility], you and your health needs are our priority.  As part of our continuing mission to provide you with exceptional heart care, we have created designated Provider Care Teams.  These Care Teams include your primary Cardiologist (physician) and Advanced Practice Providers (APPs -  Physician Assistants and Nurse Practitioners) who all work together to provide you with the care you need, when you need it. You will need a follow up appointment in 3 months.

## 2019-07-29 LAB — COMPREHENSIVE METABOLIC PANEL
ALT: 13 IU/L (ref 0–44)
AST: 26 IU/L (ref 0–40)
Albumin/Globulin Ratio: 2 (ref 1.2–2.2)
Albumin: 4.4 g/dL (ref 3.5–4.6)
Alkaline Phosphatase: 122 IU/L — ABNORMAL HIGH (ref 39–117)
BUN/Creatinine Ratio: 23 (ref 10–24)
BUN: 42 mg/dL — ABNORMAL HIGH (ref 10–36)
Bilirubin Total: 0.6 mg/dL (ref 0.0–1.2)
CO2: 24 mmol/L (ref 20–29)
Calcium: 9.3 mg/dL (ref 8.6–10.2)
Chloride: 102 mmol/L (ref 96–106)
Creatinine, Ser: 1.82 mg/dL — ABNORMAL HIGH (ref 0.76–1.27)
GFR calc Af Amer: 37 mL/min/{1.73_m2} — ABNORMAL LOW (ref >59)
GFR calc non Af Amer: 32 mL/min/{1.73_m2} — ABNORMAL LOW (ref >59)
Globulin, Total: 2.2 g/dL (ref 1.5–4.5)
Glucose: 104 mg/dL — ABNORMAL HIGH (ref 65–99)
Potassium: 5.2 mmol/L (ref 3.5–5.2)
Sodium: 139 mmol/L (ref 134–144)
Total Protein: 6.6 g/dL (ref 6.0–8.5)

## 2019-07-29 LAB — LIPID PANEL
Chol/HDL Ratio: 4.1 ratio (ref 0.0–5.0)
Cholesterol, Total: 199 mg/dL (ref 100–199)
HDL: 49 mg/dL (ref >39)
LDL Calculated: 129 mg/dL — ABNORMAL HIGH (ref 0–99)
Triglycerides: 105 mg/dL (ref 0–149)
VLDL Cholesterol Cal: 21 mg/dL (ref 5–40)

## 2019-07-29 LAB — PRO B NATRIURETIC PEPTIDE: NT-Pro BNP: 4178 pg/mL — ABNORMAL HIGH (ref 0–486)

## 2019-08-04 ENCOUNTER — Ambulatory Visit: Payer: Medicare Other | Admitting: Cardiology

## 2019-09-08 ENCOUNTER — Other Ambulatory Visit: Payer: Self-pay

## 2019-09-08 MED ORDER — ENTRESTO 49-51 MG PO TABS: 1.0000 | ORAL_TABLET | Freq: Two times a day (BID) | ORAL | 2 refills | Status: DC

## 2019-09-10 ENCOUNTER — Telehealth: Payer: Self-pay | Admitting: Cardiology

## 2019-09-10 ENCOUNTER — Telehealth: Payer: Self-pay | Admitting: *Deleted

## 2019-09-10 MED ORDER — ENTRESTO 49-51 MG PO TABS: 1.0000 | ORAL_TABLET | Freq: Two times a day (BID) | ORAL | 1 refills | Status: DC

## 2019-09-10 NOTE — Telephone Encounter (Signed)
Correction please call the [facility] [phone]- [phone].

## 2019-09-10 NOTE — Telephone Encounter (Signed)
Refill sent to [facility] for entresto per pt request.

## 2019-09-10 NOTE — Telephone Encounter (Signed)
Please recall in Entresto to the Mail order pharmacy, thee pharmacy is stating that we did not do.. He has 1 more pill left and needs at least #10 pills called [facility].

## 2019-09-13 NOTE — Telephone Encounter (Signed)
Error

## 2019-11-07 NOTE — Progress Notes (Signed)
Cardiology Office Note:    Date:  11/08/2019   ID:  [name], DOB [birth year], MRN [id number]  PCP:  Charles Downing, MD  Cardiologist:  Charles More, MD    Referring MD: [name], *    ASSESSMENT:    1. Chronic systolic (congestive) heart failure ([place])   2. 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 Paroxysmal atrial fibrillation ([place])   4. Chronic anticoagulation   5. Coronary artery disease involving native coronary artery of native heart with angina pectoris ([place])   6. Hyperlipidemia, unspecified hyperlipidemia type    PLAN:    In order of problems listed above:  1. Really is done very well with close supervision by his daughter compliance with his diuretic and guideline directed therapy with Entresto.  He is beta-blocker with atrial fibrillation and taking Aricept therapy.  We have not placed him on MRA with CKD.  He is New York Heart Association class I we will continue current treatment Stable BP at target access recent labs from his PCP and for now avoid MRA Chronic A. fib now anticoagulated reduce dose Eliquis and continue the same Stable CAD New York Heart Association class I continue current treatment having no angina Continue his low intensity statin with age and CAD  Next appointment: 1 month   Medication Adjustments/Labs and Tests Ordered: Current medicines are reviewed at length with the patient today.  Concerns regarding medicines are outlined above.  Orders Placed This Encounter  Procedures  . EKG 12-Lead   Meds ordered this encounter  Medications  . apixaban (ELIQUIS) 2.5 MG TABS tablet    Sig: Take 1 tablet (2.5 mg total) by mouth 2 (two) times daily.    Dispense:  180 tablet    Refill:  3    Chief Complaint  Patient presents with  . Follow-up  . Congestive Heart Failure  . Coronary Artery Disease  . Atrial Fibrillation  . Anticoagulation    History  of Present Illness:    [name] is a 85 y.o. male with a hx of CAD with PCI 2002 in 2003, atrial fibrillation, SVT, sinus bradycardia, HLD, heart failure EF 30% in December 2016 with moderate AR, MR and TR, CKD 3 in 2017. His most recent ischemic evaluation with a left heart cath 01/20/2017 with EF 25%, patent stent in LAD  He was last seen 07/28/2019 with compensated heart failure on guideline directed therapy including beta-blocker loop diuretic and Entresto.  He is also anticoagulated.  His N-terminal proBNP levels have been stable.   Ref Range & Units 9mo ago 60mo ago 58yr ago  NT-Pro BNP 0 - 486 pg/mL 4,178High   4,702High  CM  4,298High     Compliance with diet, lifestyle and medications: Yes  He is supervised by his daughter has minimal outside contact with other people.  He wears a mask.  He has had no fever chills or respiratory infection.  He is tolerating his anticoagulant without bleeding complication his weight at home is stable he is compliant with his medications.  Both the patient and his daughter think he is improved with Entresto and presently his heart failure is New York Heart Association class I he has had no edema orthopnea shortness of breath chest pain palpitation or syncope.  He relates recent labs in his PCP office not available in the electronic record and have requested a copy Past Medical History:  Diagnosis Date  . BPH (benign prostatic hyperplasia)   . Chronic sinus bradycardia   . Chronic systolic (congestive) heart failure ([place])   . CKD (chronic kidney disease), stage III   . Coronary artery disease   . Diverticulosis   . GERD (gastroesophageal reflux disease)   . Hypercholesterolemia   . Hypertension   . IBS (irritable bowel syndrome)   . Ischemic cardiomyopathy 09/21/2018  . Kidney stone   . Left hip pain    Chronic left hip discomfort  . Myocardial infarction [facility]) 2001   Previous anterolateral myocardial infarction with presistent ST-T wave changes   . Osteoarthritis   . Sleep apnea    uses cpap  . SVT (supraventricular tachycardia) ([place])   . Tubular adenoma     Past Surgical History:  Procedure Laterality Date  . ANGIOPLASTY  2002   with stent and angioplasty in 2003  . APPENDECTOMY    . BACK SURGERY    . CARDIAC CATHETERIZATION  05/07/2002   Ejection fraction 20%  . CARPAL TUNNEL RELEASE Left 08/29/2016   Procedure: LEFT CARPAL TUNNEL RELEASE;  Surgeon: Charles Brod, MD;  Location: [place];  Service: Orthopedics;  Laterality: Left;  REG/FAB  . FINGER SURGERY    . HIP CLOSED REDUCTION  01/31/2012   Procedure: CLOSED MANIPULATION HIP;  Surgeon: Charles Alf, MD;  [facility];  Service: Orthopedics;  Laterality: Right;  closed reduction right dislocated hip  . HIP CLOSED REDUCTION  10/10/2012   Procedure: CLOSED MANIPULATION HIP;  Surgeon: Charles Cabal, MD;  [facility];  Service: Orthopedics;  Laterality: Right;  . HIP CLOSED REDUCTION Right 03/17/2015   Procedure: CLOSED REDUCTION HIP;  Surgeon: Charles Can, MD;  [facility];  Service: Orthopedics;  Laterality: Right;  . JOINT REPLACEMENT     BILATERAL HIP REPLACEMENTS  . TONSILLECTOMY    . TOTAL HIP REVISION Right 09/06/2015   Procedure: RIGHT TOTAL HIP ACETABULAR LINER REVISION (CONSTRAINED LINER);  Surgeon: Charles Arabian, MD;  [facility];  Service: Orthopedics;  Laterality: Right;  . TRANSURETHRAL RESECTION OF PROSTATE      Current Medications: Current Meds  Medication Sig  . apixaban (ELIQUIS) 2.5 MG TABS tablet Take 1 tablet (2.5 mg total) by mouth 2 (two) times daily.  [name] donepezil (ARICEPT) 10 MG tablet Take 10 mg by mouth at bedtime.  . ferrous sulfate 325 (65 FE) MG tablet Take 325 mg by mouth 2 (two) times daily with a meal.   . furosemide (LASIX) 20 MG tablet Take 1 tablet (20 mg total) by mouth daily.  . Metoprolol Succinate 25 MG CS24 Take 1 tablet by mouth daily.  . pravastatin (PRAVACHOL) 20 MG tablet Take 1 tablet  (20 mg total) by mouth every evening.  . sacubitril-valsartan (ENTRESTO) 49-51 MG Take 1 tablet by mouth 2 (two) times daily.  [name] terazosin (HYTRIN) 10 MG capsule Take 1 capsule (10 mg total) by mouth at bedtime.  . [DISCONTINUED] apixaban (ELIQUIS) 2.5 MG TABS tablet Take 1 tablet (2.5 mg total) by mouth 2 (two) times daily.     Allergies:   Beef-derived products, Acetaminophen, Amiodarone, Prilosec [omeprazole], and Prednisone   Social History   Socioeconomic History  . Marital status: Married    Spouse name: Not on file  . Number of children: Not on file  . Years of education: Not on file  . Highest education level: Not on file  Occupational History  . Not on file  Social Needs  .  Financial resource strain: Not on file  . Food insecurity    Worry: Not on file    Inability: Not on file  . Transportation needs    Medical: Not on file    Non-medical: Not on file  Tobacco Use  . Smoking status: Never Smoker  . Smokeless tobacco: Never Used  Substance and Sexual Activity  . Alcohol use: No  . Drug use: No  . Sexual activity: Never  Lifestyle  . Physical activity    Days per week: Not on file    Minutes per session: Not on file  . Stress: Not on file  Relationships  . Social Herbalist on phone: Not on file    Gets together: Not on file    Attends religious service: Not on file    Active member of club or organization: Not on file    Attends meetings of clubs or organizations: Not on file    Relationship status: Not on file  Other Topics Concern  . Not on file  Social History Narrative  . Not on file     Family History: The patient's family history includes Alzheimer's disease in his mother; Cancer in his sister; Heart attack in his father; Stroke in his father and mother. ROS:   Please see the history of present illness.    All other systems reviewed and are negative.  EKGs/Labs/Other Studies Reviewed:    The following studies were reviewed today:   EKG:  EKG ordered today and personally reviewed.  The ekg ordered today demonstrates rate controlled atrial fibrillation  Recent Labs: 01/25/2019: TSH 6.650 07/28/2019: ALT 13; BUN 42; Creatinine, Ser 1.82; NT-Pro BNP 4,178; Potassium 5.2; Sodium 139  Recent Lipid Panel    Component Value Date/Time   CHOL 199 07/28/2019 0951   TRIG 105 07/28/2019 0951   HDL 49 07/28/2019 0951   CHOLHDL 4.1 07/28/2019 0951   CHOLHDL 2.5 01/15/2016 1104   VLDL 13 01/15/2016 1104   LDLCALC 129 (H) 07/28/2019 0951    Physical Exam:    VS:  BP (!) 146/76 (BP Location: Right Arm, Patient Position: Sitting, Cuff Size: Normal)   Pulse 72   Ht 5\' 7"  (1.702 m)   Wt 160 lb 3.2 oz (72.7 kg)   SpO2 98%   BMI 25.09 kg/m     Wt Readings from Last 3 Encounters:  11/08/19 160 lb 3.2 oz (72.7 kg)  07/28/19 159 lb 6.4 oz (72.3 kg)  05/03/19 161 lb (73 kg)     GEN:  Well nourished, well developed in no acute distress HEENT: Normal NECK: No JVD; No carotid bruits LYMPHATICS: No lymphadenopathy CARDIAC: Regular variable first heart sound RRR, no murmurs, rubs, gallops RESPIRATORY:  Clear to auscultation without rales, wheezing or rhonchi  ABDOMEN: Soft, non-tender, non-distended MUSCULOSKELETAL:  No edema; No deformity  SKIN: Warm and dry NEUROLOGIC:  Alert and oriented x 3 PSYCHIATRIC:  Normal affect    Signed, Charles More, MD  11/08/2019 10:14 AM    [place]

## 2019-11-08 ENCOUNTER — Ambulatory Visit (INDEPENDENT_AMBULATORY_CARE_PROVIDER_SITE_OTHER): Payer: Medicare Other | Admitting: Cardiology

## 2019-11-08 ENCOUNTER — Other Ambulatory Visit: Payer: Self-pay

## 2019-11-08 ENCOUNTER — Encounter: Payer: Self-pay | Admitting: *Deleted

## 2019-11-08 ENCOUNTER — Encounter: Payer: Self-pay | Admitting: Cardiology

## 2019-11-08 VITALS — BP 146/76 | HR 72 | Ht 67.0 in | Wt 160.2 lb

## 2019-11-08 DIAGNOSIS — I25119 Atherosclerotic heart disease of native coronary artery with unspecified angina pectoris: Secondary | ICD-10-CM

## 2019-11-08 DIAGNOSIS — E785 Hyperlipidemia, unspecified: Secondary | ICD-10-CM

## 2019-11-08 DIAGNOSIS — I480 Paroxysmal atrial fibrillation: Secondary | ICD-10-CM | POA: Diagnosis not present

## 2019-11-08 DIAGNOSIS — I5022 Chronic systolic (congestive) heart failure: Secondary | ICD-10-CM | POA: Diagnosis not present

## 2019-11-08 DIAGNOSIS — Z7901 Long term (current) use of anticoagulants: Secondary | ICD-10-CM | POA: Diagnosis not present

## 2019-11-08 DIAGNOSIS — N183 Chronic kidney disease, stage 3 unspecified: Secondary | ICD-10-CM

## 2019-11-08 DIAGNOSIS — I130 Hypertensive heart and chronic kidney disease with heart failure and stage 1 through stage 4 chronic kidney disease, or unspecified chronic kidney disease: Secondary | ICD-10-CM

## 2019-11-08 MED ORDER — APIXABAN 2.5 MG PO TABS: 2.5000 mg | ORAL_TABLET | Freq: Two times a day (BID) | ORAL | 3 refills | Status: DC

## 2019-11-08 NOTE — Patient Instructions (Signed)
Medication Instructions:  Your physician recommends that you continue on your current medications as directed. Please refer to the Current Medication list given to you today.  *If you need a refill on your cardiac medications before your next appointment, please call your pharmacy*  Lab Work: None  If you have labs (blood work) drawn today and your tests are completely normal, you will receive your results only by: . MyChart Message (if you have MyChart) OR . A paper copy in the mail If you have any lab test that is abnormal or we need to change your treatment, we will call you to review the results.  Testing/Procedures: You had an EKG today.   Follow-Up: At [facility], you and your health needs are our priority.  As part of our continuing mission to provide you with exceptional heart care, we have created designated Provider Care Teams.  These Care Teams include your primary Cardiologist (physician) and Advanced Practice Providers (APPs -  Physician Assistants and Nurse Practitioners) who all work together to provide you with the care you need, when you need it.  Your next appointment:   3 months  The format for your next appointment:   In Person  Provider:   Brian Munley, MD  

## 2020-01-03 ENCOUNTER — Other Ambulatory Visit: Payer: Self-pay | Admitting: Cardiology

## 2020-01-03 MED ORDER — ENTRESTO 49-51 MG PO TABS: 1.0000 | ORAL_TABLET | Freq: Two times a day (BID) | ORAL | 1 refills | Status: DC

## 2020-01-03 NOTE — Telephone Encounter (Signed)
Pt calling stating that he is almost our of this medication and would like a refill sent in ASAP. Please address

## 2020-01-23 NOTE — Progress Notes (Signed)
Cardiology Office Note:    Date:  01/24/2020   ID:  [name], DOB [birth year], MRN [id number]  PCP:  Leonard Downing, MD  Cardiologist:  Shirlee More, MD    Referring MD: Leonard Downing, *    ASSESSMENT:    1. Chronic systolic (congestive) heart failure ([place])   2. 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 Paroxysmal atrial fibrillation ([place])   4. Chronic anticoagulation   5. Hyperlipidemia, unspecified hyperlipidemia type   6. Weakness of lower extremity, unspecified laterality    PLAN:    In order of problems listed above:  1. His heart failure is decompensated he is fluid overloaded at risk of hospitalization switch from furosemide to torsemide higher dose close observation and if he fails to respond to require hospitalization.  We will continue his guideline directed therapy with beta-blocker and high-dose Entresto. 2. BP elevated should respond to diuresis and guideline directed treatment 3. Continue anticoagulation for paroxysmal atrial fibrillation 4. 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  Concerns regarding medicines are outlined above.  No orders of the defined types were placed in this encounter.  No orders of the defined types were placed in this encounter.   Chief Complaint  Patient presents with  . Follow-up    He is very anxious about his limitations and ambulating in the care of his wife  . Congestive Heart Failure    History of Present Illness:    [name] is a 86 y.o. male with a hx of  CAD with PCI 2002 in 2003, atrial fibrillation, SVT, sinus bradycardia, HLD, heart failure EF 30% in December 2016 with moderate AR, MR and TR, CKD 3 in 2017. His most  recent ischemic evaluation with a left heart cath 01/20/2017 with EF 25%, patent stent in LAD  last seen 11/08/2019. Compliance with diet, lifestyle and medications: Yes  Weight is up 10 pounds on our scale today  He is not doing well both he and his wife are struggling to be independent and frightened about skilled nursing.  He just was not aware that his weight had increased and is not using a pillbox.  I asked him to start using 1 and for his daughter to supervise him daily as he is in decompensated heart failure.  Although his weight is up 10 pounds he is greater than 4+ edema to his thighs he short of breath with activity but no orthopnea no chest pain palpitation or syncope he is having muscle weakness quite suggestive of a statin myopathy he will discontinue we will check full labs including a CBC with anticoagulation proBNP level with heart failure and a comprehensive panel. Past Medical History:  Diagnosis Date  . BPH (benign prostatic hyperplasia)   . Chronic sinus bradycardia   . Chronic systolic (congestive) heart failure ([place])   . CKD (chronic kidney disease), stage III   . Coronary artery disease   . Diverticulosis   . GERD (gastroesophageal reflux disease)   . Hypercholesterolemia   . Hypertension   . IBS (irritable bowel syndrome)   . Ischemic cardiomyopathy 09/21/2018  . Kidney stone   . Left hip pain    Chronic left hip discomfort  . Myocardial infarction ([place])  2001   Previous anterolateral myocardial infarction with presistent ST-T wave changes  . Osteoarthritis   . Sleep apnea    uses cpap  . SVT (supraventricular tachycardia) ([place])   . Tubular adenoma     Past Surgical History:  Procedure Laterality Date  . ANGIOPLASTY  2002   with stent and angioplasty in 2003  . APPENDECTOMY    . BACK SURGERY    . CARDIAC CATHETERIZATION  05/07/2002   Ejection fraction 20%  . CARPAL TUNNEL RELEASE Left 08/29/2016   Procedure: LEFT CARPAL TUNNEL RELEASE;  Surgeon: [name],  MD;  Location: [place];  Service: Orthopedics;  Laterality: Left;  REG/FAB  . FINGER SURGERY    . HIP CLOSED REDUCTION  01/31/2012   Procedure: CLOSED MANIPULATION HIP;  Surgeon: [name], MD;  Location: [facility];  Service: Orthopedics;  Laterality: Right;  closed reduction right dislocated hip  . HIP CLOSED REDUCTION  10/10/2012   Procedure: CLOSED MANIPULATION HIP;  Surgeon: [name], MD;  Location: [facility];  Service: Orthopedics;  Laterality: Right;  . HIP CLOSED REDUCTION Right 03/17/2015   Procedure: CLOSED REDUCTION HIP;  Surgeon: Rod Can, MD;  Location: [facility];  Service: Orthopedics;  Laterality: Right;  . JOINT REPLACEMENT     BILATERAL HIP REPLACEMENTS  . TONSILLECTOMY    . TOTAL HIP REVISION Right 09/06/2015   Procedure: RIGHT TOTAL HIP ACETABULAR LINER REVISION (CONSTRAINED LINER);  Surgeon: [name], MD;  Location[facility];  Service: Orthopedics;  Laterality: Right;  . TRANSURETHRAL RESECTION OF PROSTATE      Current Medications: Current Meds  Medication Sig  . apixaban (ELIQUIS) 2.5 MG TABS tablet Take 1 tablet (2.5 mg total) by mouth 2 (two) times daily.  [name] donepezil (ARICEPT) 10 MG tablet Take 10 mg by mouth at bedtime.  . ferrous sulfate 325 (65 FE) MG tablet Take 325 mg by mouth 2 (two) times daily with a meal.   . Metoprolol Succinate 25 MG CS24 Take 1 tablet by mouth daily.  . sacubitril-valsartan (ENTRESTO) 49-51 MG Take 1 tablet by mouth 2 (two) times daily.  . tamsulosin (FLOMAX) 0.4 MG CAPS capsule Take 1 capsule by mouth daily.  [name] terazosin (HYTRIN) 10 MG capsule Take 1 capsule (10 mg total) by mouth at bedtime.  . [DISCONTINUED] furosemide (LASIX) 20 MG tablet Take 1 tablet (20 mg total) by mouth daily.  . [DISCONTINUED] pravastatin (PRAVACHOL) 20 MG tablet Take 1 tablet (20 mg total) by mouth every evening.     Allergies:   Beef-derived products, Acetaminophen, Amiodarone, Prilosec [omeprazole], and Prednisone   Social  History   Socioeconomic History  . Marital status: Married    Spouse name: Not on file  . Number of children: Not on file  . Years of education: Not on file  . Highest education level: Not on file  Occupational History  . Not on file  Tobacco Use  . Smoking status: Never Smoker  . Smokeless tobacco: Never Used  Substance and Sexual Activity  . Alcohol use: No  . Drug use: No  . Sexual activity: Never  Other Topics Concern  . Not on file  Social History Narrative  . Not on file   Social Determinants of Health   Financial Resource Strain:   . Difficulty of Paying Living Expenses: Not on file  Food Insecurity:   . Worried About Charity fundraiser in the Last Year: Not on file  . Ran Out of Food in  the Last Year: Not on file  Transportation Needs:   . Lack of Transportation (Medical): Not on file  . Lack of Transportation (Non-Medical): Not on file  Physical Activity:   . Days of Exercise per Week: Not on file  . Minutes of Exercise per Session: Not on file  Stress:   . Feeling of Stress : Not on file  Social Connections:   . Frequency of Communication with Friends and Family: Not on file  . Frequency of Social Gatherings with Friends and Family: Not on file  . Attends Religious Services: Not on file  . Active Member of Clubs or Organizations: Not on file  . Attends Archivist Meetings: Not on file  . Marital Status: Not on file     Family History: The patient's family history includes Alzheimer's disease in his mother; Cancer in his sister; Heart attack in his father; Stroke in his father and mother. ROS:   Please see the history of present illness.    All other systems reviewed and are negative.  EKGs/Labs/Other Studies Reviewed:    The following studies were reviewed today:    Recent Labs: 01/25/2019: TSH 6.650 07/28/2019: ALT 13; BUN 42; Creatinine, Ser 1.82; NT-Pro BNP 4,178; Potassium 5.2; Sodium 139  Recent Lipid Panel    Component Value  Date/Time   CHOL 199 07/28/2019 0951   TRIG 105 07/28/2019 0951   HDL 49 07/28/2019 0951   CHOLHDL 4.1 07/28/2019 0951   CHOLHDL 2.5 01/15/2016 1104   VLDL 13 01/15/2016 1104   LDLCALC 129 (H) 07/28/2019 0951    Physical Exam:    VS:  BP (!) 160/100   Pulse 77   Temp (!) 96.7 F (35.9 C)   Ht 5\' 7"  (1.702 m)   Wt 170 lb 1.3 oz (77.1 kg)   SpO2 92%   BMI 26.64 kg/m     Wt Readings from Last 3 Encounters:  01/24/20 170 lb 1.3 oz (77.1 kg)  11/08/19 160 lb 3.2 oz (72.7 kg)  07/28/19 159 lb 6.4 oz (72.3 kg)     GEN:  Well nourished, well developed in no acute distress HEENT: Normal NECK: No JVD; No carotid bruits LYMPHATICS: No lymphadenopathy CARDIAC: RRR, no murmurs, rubs, gallops RESPIRATORY:  Clear to auscultation without rales, wheezing or rhonchi  ABDOMEN: Soft, non-tender, non-distended MUSCULOSKELETAL: There are than 4+ bilateral pitting above the knee edema; No deformity  SKIN: Warm and dry NEUROLOGIC:  Alert and oriented x 3 PSYCHIATRIC:  Normal affect    Signed, Shirlee More, MD  01/24/2020 10:41 AM    [place]

## 2020-01-24 ENCOUNTER — Telehealth: Payer: Self-pay | Admitting: Cardiology

## 2020-01-24 ENCOUNTER — Ambulatory Visit (INDEPENDENT_AMBULATORY_CARE_PROVIDER_SITE_OTHER): Payer: Medicare Other | Admitting: Cardiology

## 2020-01-24 ENCOUNTER — Encounter: Payer: Self-pay | Admitting: Cardiology

## 2020-01-24 ENCOUNTER — Other Ambulatory Visit: Payer: Self-pay

## 2020-01-24 VITALS — BP 160/100 | HR 77 | Temp 96.7°F | Ht 67.0 in | Wt 170.1 lb

## 2020-01-24 DIAGNOSIS — R29898 Other symptoms and signs involving the musculoskeletal system: Secondary | ICD-10-CM

## 2020-01-24 DIAGNOSIS — I480 Paroxysmal atrial fibrillation: Secondary | ICD-10-CM | POA: Diagnosis not present

## 2020-01-24 DIAGNOSIS — I130 Hypertensive heart and chronic kidney disease with heart failure and stage 1 through stage 4 chronic kidney disease, or unspecified chronic kidney disease: Secondary | ICD-10-CM

## 2020-01-24 DIAGNOSIS — N183 Chronic kidney disease, stage 3 unspecified: Secondary | ICD-10-CM

## 2020-01-24 DIAGNOSIS — I5022 Chronic systolic (congestive) heart failure: Secondary | ICD-10-CM

## 2020-01-24 DIAGNOSIS — Z7901 Long term (current) use of anticoagulants: Secondary | ICD-10-CM

## 2020-01-24 DIAGNOSIS — E785 Hyperlipidemia, unspecified: Secondary | ICD-10-CM

## 2020-01-24 MED ORDER — TORSEMIDE 20 MG PO TABS: ORAL_TABLET | ORAL | 1 refills | Status: DC

## 2020-01-24 NOTE — Telephone Encounter (Signed)
Yes no sodium is in it

## 2020-01-24 NOTE — Telephone Encounter (Signed)
Spoke with patient.Informed it was OK to use the [employer].

## 2020-01-24 NOTE — Telephone Encounter (Signed)
Is the Nu Salt that has potassium OK to substitute for salt? Please advise

## 2020-01-24 NOTE — Telephone Encounter (Signed)
Wants to know if they can use a no salt alternative that has potssium

## 2020-01-24 NOTE — Patient Instructions (Signed)
Medication Instructions:  Your physician has recommended you make the following change in your medication:   STOP: Pravastatin STOP: Furosemide  START: Torsemide 20 mg Take 1 tab twice daily x 2 days then decrease to 1 tab daily  *If you need a refill on your cardiac medications before your next appointment, please call your pharmacy*  Lab Work: Your physician recommends that you return for lab work in: [place] BNP,TSH,CBC  If you have labs (blood work) drawn today and your tests are completely normal, you will receive your results only by: [name] MyChart Message (if you have MyChart) OR . A paper copy in the mail If you have any lab test that is abnormal or we need to change your treatment, we will call you to review the results.  Testing/Procedures: NOne  Follow-Up: At [facility], you and your health needs are our priority.  As part of our continuing mission to provide you with exceptional heart care, we have created designated Provider Care Teams.  These Care Teams include your primary Cardiologist (physician) and Advanced Practice Providers (APPs -  Physician Assistants and Nurse Practitioners) who all work together to provide you with the care you need, when you need it.  Your next appointment:   4 week(s)  The format for your next appointment:   In Person  Provider:   Shirlee More, MD or Pollyann Glen  Other Instructions

## 2020-01-25 LAB — CBC
Hematocrit: 43.1 % (ref 37.5–51.0)
Hemoglobin: 13.7 g/dL (ref 13.0–17.7)
MCH: 30 pg (ref 26.6–33.0)
MCHC: 31.8 g/dL (ref 31.5–35.7)
MCV: 95 fL (ref 79–97)
Platelets: 168 10*3/uL (ref 150–450)
RBC: 4.56 x10E6/uL (ref 4.14–5.80)
RDW: 13.3 % (ref 11.6–15.4)
WBC: 6.5 10*3/uL (ref 3.4–10.8)

## 2020-01-25 LAB — COMPREHENSIVE METABOLIC PANEL
ALT: 46 IU/L — ABNORMAL HIGH (ref 0–44)
AST: 46 IU/L — ABNORMAL HIGH (ref 0–40)
Albumin/Globulin Ratio: 1.9 (ref 1.2–2.2)
Albumin: 3.7 g/dL (ref 3.5–4.6)
Alkaline Phosphatase: 139 IU/L — ABNORMAL HIGH (ref 39–117)
BUN/Creatinine Ratio: 19 (ref 10–24)
BUN: 28 mg/dL (ref 10–36)
Bilirubin Total: 0.7 mg/dL (ref 0.0–1.2)
CO2: 22 mmol/L (ref 20–29)
Calcium: 9.4 mg/dL (ref 8.6–10.2)
Chloride: 105 mmol/L (ref 96–106)
Creatinine, Ser: 1.47 mg/dL — ABNORMAL HIGH (ref 0.76–1.27)
GFR calc Af Amer: 47 mL/min/{1.73_m2} — ABNORMAL LOW (ref >59)
GFR calc non Af Amer: 41 mL/min/{1.73_m2} — ABNORMAL LOW (ref >59)
Globulin, Total: 2 g/dL (ref 1.5–4.5)
Glucose: 88 mg/dL (ref 65–99)
Potassium: 5.2 mmol/L (ref 3.5–5.2)
Sodium: 140 mmol/L (ref 134–144)
Total Protein: 5.7 g/dL — ABNORMAL LOW (ref 6.0–8.5)

## 2020-01-25 LAB — PRO B NATRIURETIC PEPTIDE: NT-Pro BNP: 7055 pg/mL — ABNORMAL HIGH (ref 0–486)

## 2020-01-25 LAB — TSH: TSH: 4.11 u[IU]/mL (ref 0.450–4.500)

## 2020-02-08 ENCOUNTER — Other Ambulatory Visit: Payer: Self-pay

## 2020-02-08 MED ORDER — ENTRESTO 49-51 MG PO TABS: 1.0000 | ORAL_TABLET | Freq: Two times a day (BID) | ORAL | 0 refills | Status: DC

## 2020-02-08 NOTE — Telephone Encounter (Signed)
Pt called in requesting a refill on his [name] be sent over to [facility].

## 2020-02-19 NOTE — Progress Notes (Signed)
Cardiology Office Note:    Date:  02/21/2020   ID:  [name], DOB [birth year], MRN [id number]  PCP:  Leonard Downing, MD  Cardiologist:  Shirlee More, MD    Referring MD: Leonard Downing, *    ASSESSMENT:    1. Chronic systolic (congestive) heart failure ([place])   2. 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 Paroxysmal atrial fibrillation ([place])   4. Chronic anticoagulation    PLAN:    In order of problems listed above:  1. Improved I think the key issue with him is supervision and medications continue his current dose of loop diuretic beta-blocker and Entresto.  Recheck renal function and potassium after increasing his diuretic. 2. See above BP at target continue current vasodilator 3. Stable continue anticoagulant beta-blocker 4. No bleeding complication continue his beta-blocker   Next appointment: 3 months   Medication Adjustments/Labs and Tests Ordered: Current medicines are reviewed at length with the patient today.  Concerns regarding medicines are outlined above.  No orders of the defined types were placed in this encounter.  No orders of the defined types were placed in this encounter.   No chief complaint on file.   History of Present Illness:    [name] is a 86 y.o. male with a hx of CAD with PCI 2002 in 2003, atrial fibrillation, SVT, sinus bradycardia, HLD, heart failure EF 30% in December 2016 with moderate AR, MR and TR, CKD 3 in 2017. His most recent ischemic evaluation with a left heart cath 01/20/2017 with EF 25%, patent stent in LAD  last seen 01/24/2020 with decompensated heart failure. Compliance with diet, lifestyle and medications: Yes  A neighbor friend caregiver brought him here today.  His meds are supervised he is compliant his weight is down somewhere in the range of 5 pounds he is happy with the quality of his life he is not short of breath no orthopnea  edema chest pain palpitation or syncope he is using an over-the-counter salt substitute.   Ref Range & Units 3 wk ago 6 mo ago 1 yr ago  NT-Pro BNP 0 - 486 pg/mL 7,055High   4,178High  CM  4,702High     Past Medical History:  Diagnosis Date  . BPH (benign prostatic hyperplasia)   . Chronic sinus bradycardia   . Chronic systolic (congestive) heart failure ([place])   . CKD (chronic kidney disease), stage III   . Coronary artery disease   . Diverticulosis   . GERD (gastroesophageal reflux disease)   . Hypercholesterolemia   . Hypertension   . IBS (irritable bowel syndrome)   . Ischemic cardiomyopathy 09/21/2018  . Kidney stone   . Left hip pain    Chronic left hip discomfort  . Myocardial infarction [facility]) 2001   Previous anterolateral myocardial infarction with presistent ST-T wave changes  . Osteoarthritis   . Sleep apnea    uses cpap  . SVT (supraventricular tachycardia) ([place])   . Tubular adenoma     Past Surgical History:  Procedure Laterality Date  . ANGIOPLASTY  2002   with stent and angioplasty in 2003  . APPENDECTOMY    . BACK SURGERY    . CARDIAC CATHETERIZATION  05/07/2002   Ejection fraction 20%  . CARPAL TUNNEL RELEASE Left 08/29/2016   Procedure: LEFT CARPAL TUNNEL RELEASE;  Surgeon: [name], MD;  Location: [place];  Service: Orthopedics;  Laterality:  Left;  REG/FAB  . FINGER SURGERY    . HIP CLOSED REDUCTION  01/31/2012   Procedure: CLOSED MANIPULATION HIP;  Surgeon: [name], MD;  Location[facility];  Service: Orthopedics;  Laterality: Right;  closed reduction right dislocated hip  . HIP CLOSED REDUCTION  10/10/2012   Procedure: CLOSED MANIPULATION HIP;  Surgeon: [name], MD;  [facility];  Service: Orthopedics;  Laterality: Right;  . HIP CLOSED REDUCTION Right 03/17/2015   Procedure: CLOSED REDUCTION HIP;  Surgeon: Rod Can, MD;  [facility];  Service: Orthopedics;  Laterality: Right;  . JOINT REPLACEMENT      BILATERAL HIP REPLACEMENTS  . TONSILLECTOMY    . TOTAL HIP REVISION Right 09/06/2015   Procedure: RIGHT TOTAL HIP ACETABULAR LINER REVISION (CONSTRAINED LINER);  Surgeon: [name], MD;  [facility];  Service: Orthopedics;  Laterality: Right;  . TRANSURETHRAL RESECTION OF PROSTATE      Current Medications: Current Meds  Medication Sig  . apixaban (ELIQUIS) 2.5 MG TABS tablet Take 1 tablet (2.5 mg total) by mouth 2 (two) times daily.  [name] donepezil (ARICEPT) 10 MG tablet Take 10 mg by mouth at bedtime.  . ferrous sulfate 325 (65 FE) MG tablet Take 325 mg by mouth 2 (two) times daily with a meal.   . Metoprolol Succinate 25 MG CS24 Take 1 tablet by mouth daily.  . sacubitril-valsartan (ENTRESTO) 49-51 MG Take 1 tablet by mouth 2 (two) times daily.  . tamsulosin (FLOMAX) 0.4 MG CAPS capsule Take 1 capsule by mouth daily.  [name] torsemide (DEMADEX) 20 MG tablet Take 20 mg (1 tab) twice daily x 2 days then decrease to 1 tab (20 mg) daily     Allergies:   Beef-derived products, Acetaminophen, Amiodarone, Prilosec [omeprazole], and Prednisone   Social History   Socioeconomic History  . Marital status: Married    Spouse name: Not on file  . Number of children: Not on file  . Years of education: Not on file  . Highest education level: Not on file  Occupational History  . Not on file  Tobacco Use  . Smoking status: Never Smoker  . Smokeless tobacco: Never Used  Substance and Sexual Activity  . Alcohol use: No  . Drug use: No  . Sexual activity: Never  Other Topics Concern  . Not on file  Social History Narrative  . Not on file   Social Determinants of Health   Financial Resource Strain:   . Difficulty of Paying Living Expenses: Not on file  Food Insecurity:   . Worried About Charity fundraiser in the Last Year: Not on file  . Ran Out of Food in the Last Year: Not on file  Transportation Needs:   . Lack of Transportation (Medical): Not on file  . Lack of Transportation  (Non-Medical): Not on file  Physical Activity:   . Days of Exercise per Week: Not on file  . Minutes of Exercise per Session: Not on file  Stress:   . Feeling of Stress : Not on file  Social Connections:   . Frequency of Communication with Friends and Family: Not on file  . Frequency of Social Gatherings with Friends and Family: Not on file  . Attends Religious Services: Not on file  . Active Member of Clubs or Organizations: Not on file  . Attends Archivist Meetings: Not on file  . Marital Status: Not on file     Family History: The patient's family history  includes Alzheimer's disease in his mother; Cancer in his sister; Heart attack in his father; Stroke in his father and mother. ROS:   Please see the history of present illness.    All other systems reviewed and are negative.  EKGs/Labs/Other Studies Reviewed:    The following studies were reviewed today:    Recent Labs: 01/24/2020: ALT 46; BUN 28; Creatinine, Ser 1.47; Hemoglobin 13.7; NT-Pro BNP 7,055; Platelets 168; Potassium 5.2; Sodium 140; TSH 4.110  Recent Lipid Panel    Component Value Date/Time   CHOL 199 07/28/2019 0951   TRIG 105 07/28/2019 0951   HDL 49 07/28/2019 0951   CHOLHDL 4.1 07/28/2019 0951   CHOLHDL 2.5 01/15/2016 1104   VLDL 13 01/15/2016 1104   LDLCALC 129 (H) 07/28/2019 0951    Physical Exam:    VS:  BP 122/78   Pulse 92   Temp (!) 97.5 F (36.4 C)   Ht 5\' 7"  (1.702 m)   Wt 155 lb (70.3 kg)   SpO2 98%   BMI 24.28 kg/m     Wt Readings from Last 3 Encounters:  02/21/20 155 lb (70.3 kg)  01/24/20 170 lb 1.3 oz (77.1 kg)  11/08/19 160 lb 3.2 oz (72.7 kg)     GEN: He looks frail well nourished, well developed in no acute distress HEENT: Normal NECK: No JVD; No carotid bruits LYMPHATICS: No lymphadenopathy CARDIAC: Irregular rhythm S1 variable RRR, no murmurs, rubs, gallops RESPIRATORY:  Clear to auscultation without rales, wheezing or rhonchi  ABDOMEN: Soft, non-tender,  non-distended MUSCULOSKELETAL: Trace bilateral lower extremity pitting edema; No deformity  SKIN: Warm and dry NEUROLOGIC:  Alert and oriented x 3 PSYCHIATRIC:  Normal affect    Signed, Shirlee More, MD  02/21/2020 2:03 PM    [place]

## 2020-02-21 ENCOUNTER — Other Ambulatory Visit: Payer: Self-pay

## 2020-02-21 ENCOUNTER — Ambulatory Visit (INDEPENDENT_AMBULATORY_CARE_PROVIDER_SITE_OTHER): Payer: Medicare Other | Admitting: Cardiology

## 2020-02-21 ENCOUNTER — Encounter: Payer: Self-pay | Admitting: Cardiology

## 2020-02-21 VITALS — BP 122/78 | HR 92 | Temp 97.5°F | Ht 67.0 in | Wt 155.0 lb

## 2020-02-21 DIAGNOSIS — N183 Chronic kidney disease, stage 3 unspecified: Secondary | ICD-10-CM

## 2020-02-21 DIAGNOSIS — I130 Hypertensive heart and chronic kidney disease with heart failure and stage 1 through stage 4 chronic kidney disease, or unspecified chronic kidney disease: Secondary | ICD-10-CM | POA: Diagnosis not present

## 2020-02-21 DIAGNOSIS — I480 Paroxysmal atrial fibrillation: Secondary | ICD-10-CM

## 2020-02-21 DIAGNOSIS — I5022 Chronic systolic (congestive) heart failure: Secondary | ICD-10-CM

## 2020-02-21 DIAGNOSIS — Z7901 Long term (current) use of anticoagulants: Secondary | ICD-10-CM | POA: Diagnosis not present

## 2020-02-21 NOTE — Patient Instructions (Signed)
Medication Instructions:  *If you need a refill on your cardiac medications before your next appointment, please call your pharmacy*  Lab Work: Your physician recommends that you have lab work today- BMET and BNP  If you have labs (blood work) drawn today and your tests are completely normal, you will receive your results only by: [name] MyChart Message (if you have MyChart) OR . A paper copy in the mail If you have any lab test that is abnormal or we need to change your treatment, we will call you to review the results.  Follow-Up: At [facility], you and your health needs are our priority.  As part of our continuing mission to provide you with exceptional heart care, we have created designated Provider Care Teams.  These Care Teams include your primary Cardiologist (physician) and Advanced Practice Providers (APPs -  Physician Assistants and Nurse Practitioners) who all work together to provide you with the care you need, when you need it.  We recommend signing up for the patient portal called "MyChart".  Sign up information is provided on this After Visit Summary.  MyChart is used to connect with patients for Virtual Visits (Telemedicine).  Patients are able to view lab/test results, encounter notes, upcoming appointments, etc.  Non-urgent messages can be sent to your provider as well.   To learn more about what you can do with MyChart, go to [email or web address].    Your next appointment:   3 month(s)  The format for your next appointment:   In Person  Provider:   You may see Dr. Bettina Gavia or the following Advanced Practice Provider on your designated Care Team:    Laurann Montana, FNP

## 2020-02-22 ENCOUNTER — Telehealth: Payer: Self-pay

## 2020-02-22 DIAGNOSIS — I255 Ischemic cardiomyopathy: Secondary | ICD-10-CM

## 2020-02-22 DIAGNOSIS — I110 Hypertensive heart disease with heart failure: Secondary | ICD-10-CM

## 2020-02-22 LAB — BASIC METABOLIC PANEL
BUN/Creatinine Ratio: 23 (ref 10–24)
BUN: 41 mg/dL — ABNORMAL HIGH (ref 10–36)
CO2: 26 mmol/L (ref 20–29)
Calcium: 9.6 mg/dL (ref 8.6–10.2)
Chloride: 103 mmol/L (ref 96–106)
Creatinine, Ser: 1.82 mg/dL — ABNORMAL HIGH (ref 0.76–1.27)
GFR calc Af Amer: 36 mL/min/{1.73_m2} — ABNORMAL LOW (ref >59)
GFR calc non Af Amer: 32 mL/min/{1.73_m2} — ABNORMAL LOW (ref >59)
Glucose: 81 mg/dL (ref 65–99)
Potassium: 5.7 mmol/L — ABNORMAL HIGH (ref 3.5–5.2)
Sodium: 143 mmol/L (ref 134–144)

## 2020-02-22 LAB — PRO B NATRIURETIC PEPTIDE: NT-Pro BNP: 6248 pg/mL — ABNORMAL HIGH (ref 0–486)

## 2020-02-22 NOTE — Telephone Encounter (Signed)
Informed pt about labs and will need to recheck in 2 weeks.

## 2020-02-22 NOTE — Telephone Encounter (Signed)
-----   Message from Richardo Priest, MD sent at 02/22/2020  7:50 AM EST ----- Normal or stable result  His kidney function is a little bit worsened lets recheck in 2 weeks BMP

## 2020-04-13 ENCOUNTER — Other Ambulatory Visit: Payer: Self-pay

## 2020-04-13 MED ORDER — TORSEMIDE 20 MG PO TABS: 20.0000 mg | ORAL_TABLET | Freq: Every day | ORAL | 1 refills | Status: DC

## 2020-04-13 MED ORDER — APIXABAN 2.5 MG PO TABS: 2.5000 mg | ORAL_TABLET | Freq: Two times a day (BID) | ORAL | 1 refills | Status: DC

## 2020-04-13 NOTE — Telephone Encounter (Signed)
This is Dr. Munley's pt 

## 2020-04-13 NOTE — Telephone Encounter (Signed)
LOV with Dr. Bettina Gavia was on 02/21/2020

## 2020-05-24 ENCOUNTER — Other Ambulatory Visit: Payer: Self-pay

## 2020-05-24 ENCOUNTER — Ambulatory Visit (INDEPENDENT_AMBULATORY_CARE_PROVIDER_SITE_OTHER): Payer: Medicare Other | Admitting: Cardiology

## 2020-05-24 ENCOUNTER — Encounter: Payer: Self-pay | Admitting: Cardiology

## 2020-05-24 VITALS — BP 132/76 | HR 86 | Ht 67.0 in | Wt 157.0 lb

## 2020-05-24 DIAGNOSIS — I110 Hypertensive heart disease with heart failure: Secondary | ICD-10-CM

## 2020-05-24 DIAGNOSIS — R0602 Shortness of breath: Secondary | ICD-10-CM | POA: Diagnosis not present

## 2020-05-24 DIAGNOSIS — I25119 Atherosclerotic heart disease of native coronary artery with unspecified angina pectoris: Secondary | ICD-10-CM

## 2020-05-24 DIAGNOSIS — I5022 Chronic systolic (congestive) heart failure: Secondary | ICD-10-CM | POA: Diagnosis not present

## 2020-05-24 NOTE — Progress Notes (Signed)
Cardiology Office Note:    Date:  05/24/2020   ID:  [name], DOB 1925/01/23, MRN [id number]  PCP:  Leonard Downing, MD  Cardiologist:  Shirlee More, MD    Referring MD: Leonard Downing, *    ASSESSMENT:    1. Hypertensive heart disease with heart failure ([place])   2. Coronary artery disease involving native coronary artery of native heart with angina pectoris ([place])   3. Chronic systolic (congestive) heart failure ([place])   4. Shortness of breath    PLAN:    In order of problems listed above:  1. Stable presently heart failure is compensated New York Heart Association class I he has no fluid overload he will continue his current diuretic with extra dose daily for 5 pound weight gain along with medical therapy including metoprolol and Entresto.  Recheck his renal function proBNP today and the family will continue to supervise him closely at home. 2. Stable CAD New York Heart Association class I having no angina and continue his current medical treatment 3. Failure compensated New York Heart Association class I BP at target.  Continue guideline directed therapy   Next appointment: 3 months   Medication Adjustments/Labs and Tests Ordered: Current medicines are reviewed at length with the patient today.  Concerns regarding medicines are outlined above.  Orders Placed This Encounter  Procedures  . Basic metabolic panel  . Pro b natriuretic peptide   No orders of the defined types were placed in this encounter.   Chief Complaint  Patient presents with  . Follow-up    3 MO FU     History of Present Illness:    [name] is a 86 y.o. male with a hx of CAD with PCI 2002 in 2003, atrial fibrillation, SVT, sinus bradycardia, HLD, heart failure EF 30% in December 2016 with moderate AR, MR and TR, CKD 3 in 2017. His most recent ischemic evaluation with a left heart cath 01/20/2017 with EF 25%, patent stent in LAD.   He was last seen 02/21/2020. Compliance  with diet, lifestyle and medications: Yes  Overall he is doing well he is supervised by his family at times his weight will go up and have to give him extra diuretic and will reduce his weight daily and give him an extra dose of diuretic any day his weight is up 5 pounds.  He has had a difficult point in life for is alone his wife is died and the family is considering assisted living.  He is not having edema shortness of breath chest pain palpitation or syncope and overall is pleased with the quality of his life. Past Medical History:  Diagnosis Date  . BPH (benign prostatic hyperplasia)   . Chronic sinus bradycardia   . Chronic systolic (congestive) heart failure ([place])   . CKD (chronic kidney disease), stage III   . Coronary artery disease   . Diverticulosis   . GERD (gastroesophageal reflux disease)   . Hypercholesterolemia   . Hypertension   . IBS (irritable bowel syndrome)   . Ischemic cardiomyopathy 09/21/2018  . Kidney stone   . Left hip pain    Chronic left hip discomfort  . Myocardial infarction [facility]) 2001   Previous anterolateral myocardial infarction with presistent ST-T wave changes  . Osteoarthritis   . Sleep apnea    uses cpap  . SVT (supraventricular tachycardia) ([place])   . Tubular adenoma     Past Surgical History:  Procedure Laterality Date  .  ANGIOPLASTY  2002   with stent and angioplasty in 2003  . APPENDECTOMY    . BACK SURGERY    . CARDIAC CATHETERIZATION  05/07/2002   Ejection fraction 20%  . CARPAL TUNNEL RELEASE Left 08/29/2016   Procedure: LEFT CARPAL TUNNEL RELEASE;  Surgeon: [name], MD;  Location: [facility];  Service: Orthopedics;  Laterality: Left;  REG/FAB  . FINGER SURGERY    . HIP CLOSED REDUCTION  01/31/2012   Procedure: CLOSED MANIPULATION HIP;  Surgeon: [name], MD;  Location: [facility];  Service: Orthopedics;  Laterality: Right;  closed reduction right dislocated hip  . HIP CLOSED REDUCTION  10/10/2012   Procedure: CLOSED  MANIPULATION HIP;  Surgeon: [name], MD;  Location: [facility];  Service: Orthopedics;  Laterality: Right;  . HIP CLOSED REDUCTION Right 03/17/2015   Procedure: CLOSED REDUCTION HIP;  Surgeon: Rod Can, MD;  Location: [facility];  Service: Orthopedics;  Laterality: Right;  . JOINT REPLACEMENT     BILATERAL HIP REPLACEMENTS  . TONSILLECTOMY    . TOTAL HIP REVISION Right 09/06/2015   Procedure: RIGHT TOTAL HIP ACETABULAR LINER REVISION (CONSTRAINED LINER);  Surgeon: [name], MD;  Location: [facility];  Service: Orthopedics;  Laterality: Right;  . TRANSURETHRAL RESECTION OF PROSTATE      Current Medications: Current Meds  Medication Sig  . apixaban (ELIQUIS) 2.5 MG TABS tablet Take 1 tablet (2.5 mg total) by mouth 2 (two) times daily.  [name] donepezil (ARICEPT) 10 MG tablet Take 10 mg by mouth at bedtime.  . ferrous sulfate 325 (65 FE) MG tablet Take 325 mg by mouth 2 (two) times daily with a meal.   . Metoprolol Succinate 25 MG CS24 Take 1 tablet by mouth daily.  . sacubitril-valsartan (ENTRESTO) 49-51 MG Take 1 tablet by mouth 2 (two) times daily.  . tamsulosin (FLOMAX) 0.4 MG CAPS capsule Take 1 capsule by mouth daily.  [name] torsemide (DEMADEX) 20 MG tablet Take 1 tablet (20 mg total) by mouth daily.     Allergies:   Beef-derived products, Acetaminophen, Amiodarone, Prilosec [omeprazole], and Prednisone   Social History   Socioeconomic History  . Marital status: Married    Spouse name: Not on file  . Number of children: Not on file  . Years of education: Not on file  . Highest education level: Not on file  Occupational History  . Not on file  Tobacco Use  . Smoking status: Never Smoker  . Smokeless tobacco: Never Used  Substance and Sexual Activity  . Alcohol use: No  . Drug use: No  . Sexual activity: Never  Other Topics Concern  . Not on file  Social History Narrative  . Not on file   Social Determinants of Health   Financial Resource Strain:   . Difficulty of Paying  Living Expenses:   Food Insecurity:   . Worried About Charity fundraiser in the Last Year:   . Arboriculturist in the Last Year:   Transportation Needs:   . Film/video editor (Medical):   [name] Lack of Transportation (Non-Medical):   Physical Activity:   . Days of Exercise per Week:   . Minutes of Exercise per Session:   Stress:   . Feeling of Stress :   Social Connections:   . Frequency of Communication with Friends and Family:   . Frequency of Social Gatherings with Friends and Family:   . Attends Religious Services:   . Active Member of Clubs  or Organizations:   . Attends Archivist Meetings:   [name] Marital Status:      Family History: The patient's family history includes Alzheimer's disease in his mother; Cancer in his sister; Heart attack in his father; Stroke in his father and mother. ROS:   Please see the history of present illness.    All other systems reviewed and are negative.  EKGs/Labs/Other Studies Reviewed:    The following studies were reviewed today:   Recent Labs: 01/24/2020: ALT 46; Hemoglobin 13.7; Platelets 168; TSH 4.110 02/21/2020: BUN 41; Creatinine, Ser 1.82; NT-Pro BNP 6,248; Potassium 5.7; Sodium 143  Recent Lipid Panel    Component Value Date/Time   CHOL 199 07/28/2019 0951   TRIG 105 07/28/2019 0951   HDL 49 07/28/2019 0951   CHOLHDL 4.1 07/28/2019 0951   CHOLHDL 2.5 01/15/2016 1104   VLDL 13 01/15/2016 1104   LDLCALC 129 (H) 07/28/2019 0951    Physical Exam:    VS:  BP 132/76   Pulse 86   Ht 5\' 7"  (1.702 m)   Wt 157 lb (71.2 kg)   SpO2 96%   BMI 24.59 kg/m     Wt Readings from Last 3 Encounters:  05/24/20 157 lb (71.2 kg)  02/21/20 155 lb (70.3 kg)  01/24/20 170 lb 1.3 oz (77.1 kg)     GEN: He is beginning to appear frail well nourished, well developed in no acute distress HEENT: Normal NECK: No JVD; No carotid bruits LYMPHATICS: No lymphadenopathy CARDIAC: RRR, no murmurs, rubs, gallops RESPIRATORY:  Clear to  auscultation without rales, wheezing or rhonchi  ABDOMEN: Soft, non-tender, non-distended MUSCULOSKELETAL:  No edema; No deformity  SKIN: Warm and dry NEUROLOGIC:  Alert and oriented x 3 PSYCHIATRIC:  Normal affect    Signed, Shirlee More, MD  05/24/2020 3:14 PM    [facility]

## 2020-05-24 NOTE — Patient Instructions (Signed)
Medication Instructions:  Your physician recommends that you continue on your current medications as directed. Please refer to the Current Medication list given to you today.  Please weigh yourself daily and take an extra dose of torsemide daily if you were to gain more than 5 lbs. *If you need a refill on your cardiac medications before your next appointment, please call your pharmacy*   Lab Work: Your physician recommends that you return for lab work in: [place] If you have labs (blood work) drawn today and your tests are completely normal, you will receive your results only by:  [place] (if you have [place]) OR  A paper copy in the mail If you have any lab test that is abnormal or we need to change your treatment, we will call you to review the results.   Testing/Procedures: None   Follow-Up: At [facility], you and your health needs are our priority.  As part of our continuing mission to provide you with exceptional heart care, we have created designated Provider Care Teams.  These Care Teams include your primary Cardiologist (physician) and Advanced Practice Providers (APPs -  Physician Assistants and Nurse Practitioners) who all work together to provide you with the care you need, when you need it.  We recommend signing up for the patient portal called "MyChart".  Sign up information is provided on this After Visit Summary.  MyChart is used to connect with patients for Virtual Visits (Telemedicine).  Patients are able to view lab/test results, encounter notes, upcoming appointments, etc.  Non-urgent messages can be sent to your provider as well.   To learn more about what you can do with MyChart, go to [email or web address].    Your next appointment:   3 month(s)  The format for your next appointment:   In Person  Provider:   Shirlee More, MD   Other Instructions

## 2020-05-25 LAB — BASIC METABOLIC PANEL WITH GFR
BUN/Creatinine Ratio: 29 — ABNORMAL HIGH (ref 10–24)
BUN: 52 mg/dL — ABNORMAL HIGH (ref 10–36)
CO2: 21 mmol/L (ref 20–29)
Calcium: 9.6 mg/dL (ref 8.6–10.2)
Chloride: 102 mmol/L (ref 96–106)
Creatinine, Ser: 1.77 mg/dL — ABNORMAL HIGH (ref 0.76–1.27)
GFR calc Af Amer: 38 mL/min/1.73 — ABNORMAL LOW
GFR calc non Af Amer: 33 mL/min/1.73 — ABNORMAL LOW
Glucose: 89 mg/dL (ref 65–99)
Potassium: 5.5 mmol/L — ABNORMAL HIGH (ref 3.5–5.2)
Sodium: 142 mmol/L (ref 134–144)

## 2020-05-25 LAB — PRO B NATRIURETIC PEPTIDE: NT-Pro BNP: 5894 pg/mL — ABNORMAL HIGH (ref 0–486)

## 2020-06-14 ENCOUNTER — Other Ambulatory Visit: Payer: Self-pay | Admitting: Cardiology

## 2020-06-14 MED ORDER — ENTRESTO 49-51 MG PO TABS: 1.0000 | ORAL_TABLET | Freq: Two times a day (BID) | ORAL | 0 refills | Status: DC

## 2020-06-14 NOTE — Telephone Encounter (Signed)
Refill sent in per request.  

## 2020-06-14 NOTE — Telephone Encounter (Signed)
*  STAT* If patient is at the pharmacy, call can be transferred to refill team.   1. Which medications need to be refilled? (please list name of each medication and dose if known) sacubitril-valsartan (ENTRESTO) 49-51 MG  2. Which pharmacy/location (including street and city if local pharmacy) is medication to be sent to? [id number] - [place], [place].  3. Do they need a 30 day or 90 day supply? [place]

## 2020-08-01 ENCOUNTER — Other Ambulatory Visit: Payer: Self-pay

## 2020-08-01 MED ORDER — APIXABAN 2.5 MG PO TABS: 2.5000 mg | ORAL_TABLET | Freq: Two times a day (BID) | ORAL | 0 refills | Status: DC

## 2020-08-01 NOTE — Telephone Encounter (Signed)
Patient requested new RX: Pt is out of Eliquis and would like an RX to be sent to [id number] [place], [place], [place] [address] until patient gets his med through mail order.  Rx Eliquis 2.5 mg 1 twice a day # 28 tablets sent to pharmacy

## 2020-08-03 ENCOUNTER — Other Ambulatory Visit: Payer: Self-pay

## 2020-08-03 MED ORDER — APIXABAN 2.5 MG PO TABS: 2.5000 mg | ORAL_TABLET | Freq: Two times a day (BID) | ORAL | 0 refills | Status: DC

## 2020-08-04 ENCOUNTER — Telehealth: Payer: Self-pay | Admitting: Cardiology

## 2020-08-04 NOTE — Telephone Encounter (Signed)
Left message on patients voicemail letting them know Dr. Bettina Gavia would not like to make any changes to his medications at this time and would like to re-check his lab work in one week. I also gave our call back number for them to call back with any questions or concerns.    Encouraged patient to call back with any questions or concerns.

## 2020-08-04 NOTE — Telephone Encounter (Signed)
Spoke to patients wife just now and she let me know that the patients PCP was faxing over some lab work for Dr. Bettina Gavia to review. She said that she thought there was some question on if he should remain on entresto or not because his potassium is elevated. I told her that I will watch for this fax to come through and will give it to Dr. Bettina Gavia to review once it arrives.   I will route to Dr. Bettina Gavia so that he is aware.

## 2020-08-04 NOTE — Telephone Encounter (Signed)
Patient's wife states she would like to discuss patient's potassium level. Please return call to discuss.

## 2020-08-14 ENCOUNTER — Other Ambulatory Visit: Payer: Self-pay | Admitting: Cardiology

## 2020-08-29 DIAGNOSIS — I2510 Atherosclerotic heart disease of native coronary artery without angina pectoris: Secondary | ICD-10-CM | POA: Insufficient documentation

## 2020-08-29 DIAGNOSIS — M25552 Pain in left hip: Secondary | ICD-10-CM | POA: Insufficient documentation

## 2020-08-29 DIAGNOSIS — N200 Calculus of kidney: Secondary | ICD-10-CM | POA: Insufficient documentation

## 2020-08-29 DIAGNOSIS — K219 Gastro-esophageal reflux disease without esophagitis: Secondary | ICD-10-CM | POA: Insufficient documentation

## 2020-08-29 DIAGNOSIS — K589 Irritable bowel syndrome without diarrhea: Secondary | ICD-10-CM | POA: Insufficient documentation

## 2020-08-29 DIAGNOSIS — G4730 Sleep apnea, unspecified: Secondary | ICD-10-CM | POA: Insufficient documentation

## 2020-08-29 DIAGNOSIS — E7800 Pure hypercholesterolemia, unspecified: Secondary | ICD-10-CM | POA: Insufficient documentation

## 2020-08-29 DIAGNOSIS — N1832 Chronic kidney disease, stage 3b: Secondary | ICD-10-CM | POA: Insufficient documentation

## 2020-08-29 DIAGNOSIS — D369 Benign neoplasm, unspecified site: Secondary | ICD-10-CM | POA: Insufficient documentation

## 2020-08-29 DIAGNOSIS — N400 Enlarged prostate without lower urinary tract symptoms: Secondary | ICD-10-CM | POA: Insufficient documentation

## 2020-08-29 DIAGNOSIS — K5790 Diverticulosis of intestine, part unspecified, without perforation or abscess without bleeding: Secondary | ICD-10-CM | POA: Insufficient documentation

## 2020-08-29 DIAGNOSIS — I10 Essential (primary) hypertension: Secondary | ICD-10-CM | POA: Insufficient documentation

## 2020-08-30 ENCOUNTER — Other Ambulatory Visit: Payer: Self-pay

## 2020-08-30 ENCOUNTER — Encounter: Payer: Self-pay | Admitting: Cardiology

## 2020-08-30 ENCOUNTER — Ambulatory Visit (INDEPENDENT_AMBULATORY_CARE_PROVIDER_SITE_OTHER): Payer: Medicare Other | Admitting: Cardiology

## 2020-08-30 VITALS — BP 121/78 | HR 84 | Ht 67.0 in | Wt 157.0 lb

## 2020-08-30 DIAGNOSIS — I25119 Atherosclerotic heart disease of native coronary artery with unspecified angina pectoris: Secondary | ICD-10-CM | POA: Diagnosis not present

## 2020-08-30 DIAGNOSIS — I480 Paroxysmal atrial fibrillation: Secondary | ICD-10-CM | POA: Diagnosis not present

## 2020-08-30 DIAGNOSIS — I5022 Chronic systolic (congestive) heart failure: Secondary | ICD-10-CM | POA: Diagnosis not present

## 2020-08-30 DIAGNOSIS — I110 Hypertensive heart disease with heart failure: Secondary | ICD-10-CM

## 2020-08-30 DIAGNOSIS — Z7901 Long term (current) use of anticoagulants: Secondary | ICD-10-CM

## 2020-08-30 DIAGNOSIS — N1832 Chronic kidney disease, stage 3b: Secondary | ICD-10-CM

## 2020-08-30 NOTE — Patient Instructions (Signed)
Medication Instructions:  Your physician recommends that you continue on your current medications as directed. Please refer to the Current Medication list given to you today.  Please take an extra tablet of your torsemide daily if your weight is 5 lbs above your baseline *If you need a refill on your cardiac medications before your next appointment, please call your pharmacy*   Lab Work: Your physician recommends that you return for lab work in: TODAY BMP, CBC, ProBNP If you have labs (blood work) drawn today and your tests are completely normal, you will receive your results only by: [name] MyChart Message (if you have MyChart) OR . A paper copy in the mail If you have any lab test that is abnormal or we need to change your treatment, we will call you to review the results.   Testing/Procedures: None   Follow-Up: At [facility], you and your health needs are our priority.  As part of our continuing mission to provide you with exceptional heart care, we have created designated Provider Care Teams.  These Care Teams include your primary Cardiologist (physician) and Advanced Practice Providers (APPs -  Physician Assistants and Nurse Practitioners) who all work together to provide you with the care you need, when you need it.  We recommend signing up for the patient portal called "MyChart".  Sign up information is provided on this After Visit Summary.  MyChart is used to connect with patients for Virtual Visits (Telemedicine).  Patients are able to view lab/test results, encounter notes, upcoming appointments, etc.  Non-urgent messages can be sent to your provider as well.   To learn more about what you can do with MyChart, go to [email or web address].    Your next appointment:   3 month(s)  The format for your next appointment:   In Person  Provider:   Shirlee More, MD   Other Instructions

## 2020-08-30 NOTE — Progress Notes (Signed)
Cardiology Office Note:    Date:  08/30/2020   ID:  Kirstie Peri Szymborski, DOB 1925-03-18, MRN [id number]  PCP:  Leonard Downing, MD  Cardiologist:  Shirlee More, MD    Referring MD: Leonard Downing, *    ASSESSMENT:    1. Chronic systolic (congestive) heart failure ([place])   2. Hypertensive heart disease with heart failure ([place])   3. Coronary artery disease involving native coronary artery of native heart with angina pectoris (HCC)   4. Paroxysmal atrial fibrillation ([place])   5. Chronic anticoagulation   6. Stage 3b chronic kidney disease    PLAN:    In order of problems listed above:  1. In general he is done very well he is compensated has no fluid overload and set a marked improvement quality slight guideline directed therapy including his beta-blocker and Entresto and maximally tolerated times.  Recheck labs today including BMP proBNP and a CBC with anticoagulant blood pressure is at target maintained sinus rhythm I will recheck renal function with CKD   Next appointment: 3 months   Medication Adjustments/Labs and Tests Ordered: Current medicines are reviewed at length with the patient today.  Concerns regarding medicines are outlined above.  No orders of the defined types were placed in this encounter.  No orders of the defined types were placed in this encounter.   Chief Complaint  Patient presents with  . Follow-up  . Congestive Heart Failure  . Atrial Fibrillation  . Coronary Artery Disease    History of Present Illness:    DAM ASHRAF is a 86 y.o. male with a hx of CAD with PCI 2002 in 2003, atrial fibrillation, SVT, sinus bradycardia, HLD, heart failure EF 30% in December 2016 with moderate AR, MR and TR, CKD 3 in 2017. His most recent ischemic evaluation with a left heart cath 01/20/2017 with EF 25%, patent stent in LAD.    He was last seen 609 2021. Compliance with diet, lifestyle and medications: Yes  Is supervised by her son who is present  today participate in evaluation decision making.  Daughter is wife are transitioning to assisted living have asked him to be careful monitoring his weight every day is at risk for sodium retention and if he goes up 5 pounds to take an extra dose of diuretic.  He is pleased with the quality of his life he has no edema shortness of breath chest pain palpitation or syncope tolerates his cardiac medications including anticoagulant without bleeding complication.  He refuses or declines lipid-lowering therapy we discussed cardiac and he like an echo he says he does not think he needs it I would agree with him.   Ref Range & Units 3 mo ago 6 mo ago 7 mo ago  NT-Pro BNP 0 - 486 pg/mL 5,894High  6,248High CM  7,055High    Past Medical History:  Diagnosis Date  . Anaphylactic reaction 06/02/2016  . APPENDECTOMY, HX OF 02/25/2008   Qualifier: Diagnosis of  By: [place], [place]    . Bilateral carpal tunnel syndrome 08/07/2016  . Bilateral hearing loss due to cerumen impaction 04/02/2018  . Bilateral impacted cerumen 10/27/2018  . Bilateral leg weakness 01/25/2019  . BPH (benign prostatic hyperplasia)   . BPH with obstruction/lower urinary tract symptoms 02/25/2008   Qualifier: Diagnosis of  By: [place], [place]    . CARPAL TUNNEL RELEASE, RIGHT, HX OF 02/25/2008   Qualifier: Diagnosis of  By: [place], [place]    . Chronic sinus  bradycardia   . Chronic systolic (congestive) heart failure ([place])   . CKD (chronic kidney disease) stage 3, GFR 30-59 ml/min (HCC) 06/02/2016  . CKD (chronic kidney disease), stage III   . Coronary artery disease   . Coronary artery disease involving native coronary artery of native heart with angina pectoris ([place]) 02/25/2008   Qualifier: Diagnosis of  By: [place], [place]    . Cough 11/03/2008   Qualifier: Diagnosis of  By: Linda Hedges MD, [place] DISEASE, CERVICAL SPINE 02/25/2008   Qualifier: Diagnosis of  By: [place], [place]    .  Diverticulosis   . DIVERTICULOSIS, COLON 02/25/2008   Qualifier: Diagnosis of  By: [place], [place]    . GERD 02/25/2008   Qualifier: Diagnosis of  By: Danny Lawless CMA, [place]    . GERD (gastroesophageal reflux disease)   . Gross hematuria 01/28/2017  . Hip dislocation 01/31/2012  . HLD (hyperlipidemia) 02/25/2008   Qualifier: Diagnosis of  By: Danny Lawless CMA, [place]    . Hypercholesterolemia   . HYPERGLYCEMIA 02/25/2008   Qualifier: History of  By: Danny Lawless CMA, [place]    . Hypertension   . Hypertensive heart disease with heart failure ([place]) 02/25/2008   Qualifier: Diagnosis of  By: [place], [place]    . IBS (irritable bowel syndrome)   . Ischemic cardiomyopathy 09/21/2018  . Kidney stone   . Left hip pain    Chronic left hip discomfort  . Lipoma of back 08/06/2016  . Mechanical instability of hip prosthesis ([place]) 09/06/2015  . MYOCARDIAL INFARCTION 02/25/2008   Qualifier: History of  By: [place], [place]    . Myocardial infarction [facility]) 2001   Previous anterolateral myocardial infarction with presistent ST-T wave changes  . NEPHROLITHIASIS, HX OF 02/25/2008   Qualifier: Diagnosis of  By: [place], [place]    . Osteoarthritis   . Paroxysmal atrial fibrillation ([place]) 09/21/2018  . PERCUTANEOUS TRANSLUMINAL CORONARY ANGIOPLASTY, HX OF 02/25/2008   Qualifier: Diagnosis of  By: [place], [place]    . Primary osteoarthritis of both first carpometacarpal joints 08/07/2016  . Primary osteoarthritis of both hands 08/07/2016  . Renal calculus 06/30/2013  . Sinus bradycardia 11/14/2014  . Sleep apnea    uses cpap  . SVT (supraventricular tachycardia) ([place])   . TRIGGER FINGER 02/25/2008   Annotation: right index finger Qualifier: History of  By: Danny Lawless CMA, [place]    . Tubular adenoma   . TUBULOVILLOUS ADENOMA, COLON 02/25/2008   Qualifier: History of  By: [place], [place]    . Unspecified personal history presenting hazards to health 02/25/2008   Centricity Description: POLYPECTOMY, HX OF  Qualifier: Diagnosis of  By: Danny Lawless CMA, [place]   Centricity Description: PROSTATECTOMY, TRANSURETHRAL, HX OF Qualifier: Diagnosis of  By: [place], [place]      Past Surgical History:  Procedure Laterality Date  . ANGIOPLASTY  2002   with stent and angioplasty in 2003  . APPENDECTOMY    . BACK SURGERY    . CARDIAC CATHETERIZATION  05/07/2002   Ejection fraction 20%  . CARPAL TUNNEL RELEASE Left 08/29/2016   Procedure: LEFT CARPAL TUNNEL RELEASE;  Surgeon: [name], MD;  Location: [place];  Service: Orthopedics;  Laterality: Left;  REG/FAB  . FINGER SURGERY    . HIP CLOSED REDUCTION  01/31/2012   Procedure: CLOSED MANIPULATION HIP;  Surgeon: [name], MD;  [facility];  Service: Orthopedics;  Laterality: Right;  closed reduction right dislocated  hip  . HIP CLOSED REDUCTION  10/10/2012   Procedure: CLOSED MANIPULATION HIP;  Surgeon: [name], MD;  [facility];  Service: Orthopedics;  Laterality: Right;  . HIP CLOSED REDUCTION Right 03/17/2015   Procedure: CLOSED REDUCTION HIP;  Surgeon: Rod Can, MD;  [facility];  Service: Orthopedics;  Laterality: Right;  . JOINT REPLACEMENT     BILATERAL HIP REPLACEMENTS  . TONSILLECTOMY    . TOTAL HIP REVISION Right 09/06/2015   Procedure: RIGHT TOTAL HIP ACETABULAR LINER REVISION (CONSTRAINED LINER);  Surgeon: [name], MD;  [facility];  Service: Orthopedics;  Laterality: Right;  . TRANSURETHRAL RESECTION OF PROSTATE      Current Medications: Current Meds  Medication Sig  . donepezil (ARICEPT) 10 MG tablet Take 10 mg by mouth at bedtime.  [name] ELIQUIS 2.5 MG TABS tablet TAKE 1 TABLET BY MOUTH TWICE A DAY  . ferrous sulfate 325 (65 FE) MG tablet Take 325 mg by mouth 2 (two) times daily with a meal.   . Metoprolol Succinate 25 MG CS24 Take 1 tablet by mouth daily.  . sacubitril-valsartan (ENTRESTO) 49-51 MG Take 1 tablet by mouth 2 (two) times daily.  . tamsulosin (FLOMAX) 0.4 MG CAPS  capsule Take 1 capsule by mouth daily.  [name] torsemide (DEMADEX) 20 MG tablet Take 1 tablet (20 mg total) by mouth daily.     Allergies:   Beef-derived products, Acetaminophen, Amiodarone, Prilosec [omeprazole], and Prednisone   Social History   Socioeconomic History  . Marital status: Married    Spouse name: Not on file  . Number of children: Not on file  . Years of education: Not on file  . Highest education level: Not on file  Occupational History  . Not on file  Tobacco Use  . Smoking status: Never Smoker  . Smokeless tobacco: Never Used  Vaping Use  . Vaping Use: Never used  Substance and Sexual Activity  . Alcohol use: No  . Drug use: No  . Sexual activity: Never  Other Topics Concern  . Not on file  Social History Narrative  . Not on file   Social Determinants of Health   Financial Resource Strain:   . Difficulty of Paying Living Expenses: Not on file  Food Insecurity:   . Worried About Charity fundraiser in the Last Year: Not on file  . Ran Out of Food in the Last Year: Not on file  Transportation Needs:   . Lack of Transportation (Medical): Not on file  . Lack of Transportation (Non-Medical): Not on file  Physical Activity:   . Days of Exercise per Week: Not on file  . Minutes of Exercise per Session: Not on file  Stress:   . Feeling of Stress : Not on file  Social Connections:   . Frequency of Communication with Friends and Family: Not on file  . Frequency of Social Gatherings with Friends and Family: Not on file  . Attends Religious Services: Not on file  . Active Member of Clubs or Organizations: Not on file  . Attends Archivist Meetings: Not on file  . Marital Status: Not on file     Family History: The patient's family history includes Alzheimer's disease in his mother; Cancer in his sister; Heart attack in his father; Stroke in his father and mother. ROS:   Please see the history of present illness.    All other systems reviewed and  are negative.  EKGs/Labs/Other Studies Reviewed:  The following studies were reviewed today:   Recent Labs: 01/24/2020: ALT 46; Hemoglobin 13.7; Platelets 168; TSH 4.110 05/24/2020: BUN 52; Creatinine, Ser 1.77; NT-Pro BNP 5,894; Potassium 5.5; Sodium 142  Recent Lipid Panel    Component Value Date/Time   CHOL 199 07/28/2019 0951   TRIG 105 07/28/2019 0951   HDL 49 07/28/2019 0951   CHOLHDL 4.1 07/28/2019 0951   CHOLHDL 2.5 01/15/2016 1104   VLDL 13 01/15/2016 1104   LDLCALC 129 (H) 07/28/2019 0951    Physical Exam:    VS:  BP 121/78   Pulse 84   Ht 5\' 7"  (1.702 m)   Wt 157 lb (71.2 kg)   SpO2 97%   BMI 24.59 kg/m     Wt Readings from Last 3 Encounters:  08/30/20 157 lb (71.2 kg)  05/24/20 157 lb (71.2 kg)  02/21/20 155 lb (70.3 kg)     GEN: He looks younger than his age well nourished, well developed in no acute distress HEENT: Normal NECK: No JVD; No carotid bruits LYMPHATICS: No lymphadenopathy CARDIAC: RRR, no murmurs, rubs, gallops RESPIRATORY:  Clear to auscultation without rales, wheezing or rhonchi  ABDOMEN: Soft, non-tender, non-distended MUSCULOSKELETAL:  No edema; No deformity  SKIN: Warm and dry NEUROLOGIC:  Alert and oriented x 3 PSYCHIATRIC:  Normal affect    Signed, Shirlee More, MD  08/30/2020 11:42 AM    [place]

## 2020-08-31 LAB — BASIC METABOLIC PANEL
BUN/Creatinine Ratio: 20 (ref 10–24)
BUN: 34 mg/dL (ref 10–36)
CO2: 27 mmol/L (ref 20–29)
Calcium: 9.7 mg/dL (ref 8.6–10.2)
Chloride: 97 mmol/L (ref 96–106)
Creatinine, Ser: 1.67 mg/dL — ABNORMAL HIGH (ref 0.76–1.27)
GFR calc Af Amer: 40 mL/min/{1.73_m2} — ABNORMAL LOW (ref >59)
GFR calc non Af Amer: 35 mL/min/{1.73_m2} — ABNORMAL LOW (ref >59)
Glucose: 80 mg/dL (ref 65–99)
Potassium: 5.1 mmol/L (ref 3.5–5.2)
Sodium: 139 mmol/L (ref 134–144)

## 2020-08-31 LAB — CBC
Hematocrit: 42.7 % (ref 37.5–51.0)
Hemoglobin: 14.6 g/dL (ref 13.0–17.7)
MCH: 31.4 pg (ref 26.6–33.0)
MCHC: 34.2 g/dL (ref 31.5–35.7)
MCV: 92 fL (ref 79–97)
Platelets: 203 10*3/uL (ref 150–450)
RBC: 4.65 x10E6/uL (ref 4.14–5.80)
RDW: 12.3 % (ref 11.6–15.4)
WBC: 7 10*3/uL (ref 3.4–10.8)

## 2020-08-31 LAB — PRO B NATRIURETIC PEPTIDE: NT-Pro BNP: 5254 pg/mL — ABNORMAL HIGH (ref 0–486)

## 2020-11-27 NOTE — Progress Notes (Signed)
Cardiology Office Note:    Date:  11/28/2020   ID:  [name], DOB 1925/02/17, MRN [id number]  PCP:  [facility]  Cardiologist:  Shirlee More, MD    Referring MD: Leonard Downing, *    ASSESSMENT:    1. Chronic systolic (congestive) heart failure ([place])   2. Hypertensive heart disease with heart failure (HCC)   3. Permanent atrial fibrillation ([place])   4. Chronic anticoagulation   5. Stage 3b chronic kidney disease ([place])   6. Coronary artery disease involving native coronary artery of native heart with angina pectoris ([place])   7. Hyperlipidemia, unspecified hyperlipidemia type    PLAN:    In order of problems listed above:  1. Continue current therapy with Entresto and metoprolol and torsemide.  Patient is doing well and asymptomatic, New York Heart Association class I.  Will check proBNP today.  Continue his maximally tolerated guideline directed therapy including Entresto optimal dose for him beta-blocker and loop diuretic I would not place him on MRA with his CKD I would not advise ICD therapy in his age group 2. Patient is presently in atrial fibrillation, however rate controlled with metoprolol and asymptomatic.  He is presently anticoagulated with Eliquis.  No change in current therapy. 3. D stable no angina continue medical treatment note he is not on a statin personal decision not to accept lipid-lowering treatment 4. Plan to check BMP today to ensure appropriate electrolytes and renal function with CKD.   Next appointment: 4 months   Medication Adjustments/Labs and Tests Ordered: Current medicines are reviewed at length with the patient today.  Concerns regarding medicines are outlined above.  Orders Placed This Encounter  Procedures  . Basic metabolic panel  . Pro b natriuretic peptide (BNP)  . EKG 12-Lead   No orders of the defined types were placed in this encounter.   No chief complaint on file.   History of Present Illness:     [name] is a 86 y.o. male with a hx of coronary artery disease remote PCI 2002, atrial fibrillation SVT sinus bradycardia hyperlipidemia chronic systolic heart failure most recent ejection fraction in 2000 1825% moderate AR MR TR and stage III CKD.  Last seen 08/30/2020.  Patient and his wife moved into ALF in September, doing well overall.  They are eating communal food, he has not noticed any weight change.  He has not had any shortness of breath or other symptoms of CHF or atrial fibrillation.  He has no evidence of fluid overload.  Patient is in atrial fibrillation today, but rate controlled and asymptomatic.  Could consider increase in [place], but without symptoms recommend no change at this point in time.  Compliance with diet, lifestyle and medications: Yes Past Medical History:  Diagnosis Date  . Anaphylactic reaction 06/02/2016  . APPENDECTOMY, HX OF 02/25/2008   Qualifier: Diagnosis of  By: [place], [place]    . Bilateral carpal tunnel syndrome 08/07/2016  . Bilateral hearing loss due to cerumen impaction 04/02/2018  . Bilateral impacted cerumen 10/27/2018  . Bilateral leg weakness 01/25/2019  . BPH (benign prostatic hyperplasia)   . BPH with obstruction/lower urinary tract symptoms 02/25/2008   Qualifier: Diagnosis of  By: [place], [place]    . CARPAL TUNNEL RELEASE, RIGHT, HX OF 02/25/2008   Qualifier: Diagnosis of  By: [place], [place]    . Chronic sinus bradycardia   . Chronic systolic (congestive) heart failure ([place])   . CKD (chronic kidney disease)  stage 3, GFR 30-59 ml/min (HCC) 06/02/2016  . CKD (chronic kidney disease), stage III ([place])   . Coronary artery disease   . Coronary artery disease involving native coronary artery of native heart with angina pectoris ([place]) 02/25/2008   Qualifier: Diagnosis of  By: [place], [place]    . Cough 11/03/2008   Qualifier: Diagnosis of  By: Linda Hedges MD, [place] DISEASE, CERVICAL SPINE 02/25/2008    Qualifier: Diagnosis of  By: [place], [place]    . Diverticulosis   . DIVERTICULOSIS, COLON 02/25/2008   Qualifier: Diagnosis of  By: [place], [place]    . GERD 02/25/2008   Qualifier: Diagnosis of  By: Danny Lawless CMA, [place]    . GERD (gastroesophageal reflux disease)   . Gross hematuria 01/28/2017  . Hip dislocation 01/31/2012  . HLD (hyperlipidemia) 02/25/2008   Qualifier: Diagnosis of  By: Danny Lawless CMA, [place]    . Hypercholesterolemia   . HYPERGLYCEMIA 02/25/2008   Qualifier: History of  By: Danny Lawless CMA, [place]    . Hypertension   . Hypertensive heart disease with heart failure ([place]) 02/25/2008   Qualifier: Diagnosis of  By: [place], [place]    . IBS (irritable bowel syndrome)   . Ischemic cardiomyopathy 09/21/2018  . Kidney stone   . Left hip pain    Chronic left hip discomfort  . Lipoma of back 08/06/2016  . Mechanical instability of hip prosthesis ([place]) 09/06/2015  . MYOCARDIAL INFARCTION 02/25/2008   Qualifier: History of  By: [place], [place]    . Myocardial infarction [facility]) 2001   Previous anterolateral myocardial infarction with presistent ST-T wave changes  . NEPHROLITHIASIS, HX OF 02/25/2008   Qualifier: Diagnosis of  By: [place], [place]    . Osteoarthritis   . Paroxysmal atrial fibrillation ([place]) 09/21/2018  . PERCUTANEOUS TRANSLUMINAL CORONARY ANGIOPLASTY, HX OF 02/25/2008   Qualifier: Diagnosis of  By: [place], [place]    . Primary osteoarthritis of both first carpometacarpal joints 08/07/2016  . Primary osteoarthritis of both hands 08/07/2016  . Renal calculus 06/30/2013  . Sinus bradycardia 11/14/2014  . Sleep apnea    uses cpap  . SVT (supraventricular tachycardia) ([place])   . TRIGGER FINGER 02/25/2008   Annotation: right index finger Qualifier: History of  By: Danny Lawless CMA, [place]    . Tubular adenoma   . TUBULOVILLOUS ADENOMA, COLON 02/25/2008   Qualifier: History of  By: [place], [place]    . Unspecified personal history presenting hazards to health  02/25/2008   Centricity Description: POLYPECTOMY, HX OF Qualifier: Diagnosis of  By: Danny Lawless CMA, [place]   Centricity Description: PROSTATECTOMY, TRANSURETHRAL, HX OF Qualifier: Diagnosis of  By: [place], [place]      Past Surgical History:  Procedure Laterality Date  . ANGIOPLASTY  2002   with stent and angioplasty in 2003  . APPENDECTOMY    . BACK SURGERY    . CARDIAC CATHETERIZATION  05/07/2002   Ejection fraction 20%  . CARPAL TUNNEL RELEASE Left 08/29/2016   Procedure: LEFT CARPAL TUNNEL RELEASE;  Surgeon: [name], MD;  Location: [place];  Service: Orthopedics;  Laterality: Left;  REG/FAB  . FINGER SURGERY    . HIP CLOSED REDUCTION  01/31/2012   Procedure: CLOSED MANIPULATION HIP;  Surgeon: [name], MD;  [facility];  Service: Orthopedics;  Laterality: Right;  closed reduction right dislocated hip  . HIP CLOSED REDUCTION  10/10/2012   Procedure: CLOSED MANIPULATION HIP;  Surgeon:  [name], MD;  [facility];  Service: Orthopedics;  Laterality: Right;  . HIP CLOSED REDUCTION Right 03/17/2015   Procedure: CLOSED REDUCTION HIP;  Surgeon: Rod Can, MD;  [facility];  Service: Orthopedics;  Laterality: Right;  . JOINT REPLACEMENT     BILATERAL HIP REPLACEMENTS  . TONSILLECTOMY    . TOTAL HIP REVISION Right 09/06/2015   Procedure: RIGHT TOTAL HIP ACETABULAR LINER REVISION (CONSTRAINED LINER);  Surgeon: [name], MD;  [facility];  Service: Orthopedics;  Laterality: Right;  . TRANSURETHRAL RESECTION OF PROSTATE      Current Medications: Current Meds  Medication Sig  . donepezil (ARICEPT) 10 MG tablet Take 10 mg by mouth at bedtime.  [name] ELIQUIS 2.5 MG TABS tablet TAKE 1 TABLET BY MOUTH TWICE A DAY  . ferrous sulfate 325 (65 FE) MG tablet Take 325 mg by mouth 2 (two) times daily with a meal.   . Metoprolol Succinate 25 MG CS24 Take 1 tablet by mouth daily.  . sacubitril-valsartan (ENTRESTO) 49-51 MG Take 1 tablet by mouth 2  (two) times daily.  . tamsulosin (FLOMAX) 0.4 MG CAPS capsule Take 1 capsule by mouth daily.  [name] torsemide (DEMADEX) 20 MG tablet Take 1 tablet (20 mg total) by mouth daily.     Allergies:   Beef-derived products, Acetaminophen, Amiodarone, Prilosec [omeprazole], and Prednisone   Social History   Socioeconomic History  . Marital status: Married    Spouse name: Not on file  . Number of children: Not on file  . Years of education: Not on file  . Highest education level: Not on file  Occupational History  . Not on file  Tobacco Use  . Smoking status: Never Smoker  . Smokeless tobacco: Never Used  Vaping Use  . Vaping Use: Never used  Substance and Sexual Activity  . Alcohol use: No  . Drug use: No  . Sexual activity: Never  Other Topics Concern  . Not on file  Social History Narrative  . Not on file   Social Determinants of Health   Financial Resource Strain: Not on file  Food Insecurity: Not on file  Transportation Needs: Not on file  Physical Activity: Not on file  Stress: Not on file  Social Connections: Not on file     Family History: The patient's family history includes Alzheimer's disease in his mother; Cancer in his sister; Heart attack in his father; Stroke in his father and mother. ROS:   Please see the history of present illness.    All other systems reviewed and are negative.  EKGs/Labs/Other Studies Reviewed:    The following studies were reviewed today:  EKG:  EKG ordered today and personally reviewed.  The ekg ordered today demonstrates rate controlled atrial fibrillation.  Recent Labs: 01/24/2020: ALT 46; TSH 4.110 08/30/2020: BUN 34; Creatinine, Ser 1.67; Hemoglobin 14.6; NT-Pro BNP 5,254; Platelets 203; Potassium 5.1; Sodium 139  Recent Lipid Panel    Component Value Date/Time   CHOL 199 07/28/2019 0951   TRIG 105 07/28/2019 0951   HDL 49 07/28/2019 0951   CHOLHDL 4.1 07/28/2019 0951   CHOLHDL 2.5 01/15/2016 1104   VLDL 13 01/15/2016 1104    LDLCALC 129 (H) 07/28/2019 0951    Physical Exam:    VS:  BP 138/62   Pulse 78   Ht 5\' 7"  (1.702 m)   Wt 165 lb (74.8 kg)   SpO2 96%   BMI 25.84 kg/m     Wt Readings from Last  3 Encounters:  11/28/20 165 lb (74.8 kg)  08/30/20 157 lb (71.2 kg)  05/24/20 157 lb (71.2 kg)     GEN: Age-appropriate WM, resting comfortably in chair, well nourished, well developed in no acute distress HEENT: Normal NECK: No JVD; No carotid bruits LYMPHATICS: No lymphadenopathy CARDIAC: Regular rate but irregularly irregular rhythm, no murmurs, rubs, gallops RESPIRATORY:  Clear to auscultation without rales, wheezing or rhonchi  ABDOMEN: Soft, non-tender, non-distended MUSCULOSKELETAL:  No edema; No deformity  SKIN: Warm and dry NEUROLOGIC:  Alert and oriented x 3 PSYCHIATRIC:  Normal affect    Signed, Shirlee More, MD  11/28/2020 11:26 AM    [place]

## 2020-11-28 ENCOUNTER — Encounter: Payer: Self-pay | Admitting: Cardiology

## 2020-11-28 ENCOUNTER — Ambulatory Visit (INDEPENDENT_AMBULATORY_CARE_PROVIDER_SITE_OTHER): Payer: Medicare Other | Admitting: Cardiology

## 2020-11-28 ENCOUNTER — Other Ambulatory Visit: Payer: Self-pay

## 2020-11-28 VITALS — BP 138/62 | HR 78 | Ht 67.0 in | Wt 165.0 lb

## 2020-11-28 DIAGNOSIS — I25119 Atherosclerotic heart disease of native coronary artery with unspecified angina pectoris: Secondary | ICD-10-CM

## 2020-11-28 DIAGNOSIS — N1832 Chronic kidney disease, stage 3b: Secondary | ICD-10-CM

## 2020-11-28 DIAGNOSIS — I110 Hypertensive heart disease with heart failure: Secondary | ICD-10-CM

## 2020-11-28 DIAGNOSIS — Z7901 Long term (current) use of anticoagulants: Secondary | ICD-10-CM

## 2020-11-28 DIAGNOSIS — I5022 Chronic systolic (congestive) heart failure: Secondary | ICD-10-CM | POA: Diagnosis not present

## 2020-11-28 DIAGNOSIS — E785 Hyperlipidemia, unspecified: Secondary | ICD-10-CM

## 2020-11-28 DIAGNOSIS — I4821 Permanent atrial fibrillation: Secondary | ICD-10-CM | POA: Diagnosis not present

## 2020-11-28 NOTE — Patient Instructions (Signed)
Medication Instructions:  Your physician recommends that you continue on your current medications as directed. Please refer to the Current Medication list given to you today.  *If you need a refill on your cardiac medications before your next appointment, please call your pharmacy*   Lab Work: Your physician recommends that you return for lab work in: TODAY BMP, ProBNP If you have labs (blood work) drawn today and your tests are completely normal, you will receive your results only by: [name] MyChart Message (if you have MyChart) OR . A paper copy in the mail If you have any lab test that is abnormal or we need to change your treatment, we will call you to review the results.   Testing/Procedures: None   Follow-Up: At [facility], you and your health needs are our priority.  As part of our continuing mission to provide you with exceptional heart care, we have created designated Provider Care Teams.  These Care Teams include your primary Cardiologist (physician) and Advanced Practice Providers (APPs -  Physician Assistants and Nurse Practitioners) who all work together to provide you with the care you need, when you need it.  We recommend signing up for the patient portal called "MyChart".  Sign up information is provided on this After Visit Summary.  MyChart is used to connect with patients for Virtual Visits (Telemedicine).  Patients are able to view lab/test results, encounter notes, upcoming appointments, etc.  Non-urgent messages can be sent to your provider as well.   To learn more about what you can do with MyChart, go to [email or web address].    Your next appointment:   4 month(s)  The format for your next appointment:   In Person  Provider:   Shirlee More, MD   Other Instructions

## 2020-11-29 LAB — PRO B NATRIURETIC PEPTIDE: NT-Pro BNP: 6162 pg/mL — ABNORMAL HIGH (ref 0–486)

## 2020-11-29 LAB — BASIC METABOLIC PANEL
BUN/Creatinine Ratio: 18 (ref 10–24)
BUN: 29 mg/dL (ref 10–36)
CO2: 23 mmol/L (ref 20–29)
Calcium: 9.6 mg/dL (ref 8.6–10.2)
Chloride: 105 mmol/L (ref 96–106)
Creatinine, Ser: 1.61 mg/dL — ABNORMAL HIGH (ref 0.76–1.27)
GFR calc Af Amer: 42 mL/min/{1.73_m2} — ABNORMAL LOW (ref >59)
GFR calc non Af Amer: 36 mL/min/{1.73_m2} — ABNORMAL LOW (ref >59)
Glucose: 81 mg/dL (ref 65–99)
Potassium: 4.9 mmol/L (ref 3.5–5.2)
Sodium: 142 mmol/L (ref 134–144)

## 2021-03-26 NOTE — Progress Notes (Signed)
Cardiology Office Note:    Date:  03/27/2021   ID:  [name], DOB 1925/04/22, MRN [id number]  PCP:  [facility]  Cardiologist:  Shirlee More, MD    Referring MD: Florina Ou*    ASSESSMENT:    1. Chronic systolic (congestive) heart failure ([place])   2. Hypertensive heart disease with heart failure (HCC)   3. Permanent atrial fibrillation ([place])   4. Chronic anticoagulation   5. Stage 3b chronic kidney disease ([place])   6. Coronary artery disease involving native coronary artery of native heart with angina pectoris ([place])    PLAN:    In order of problems listed above:  1. Overall doing well heart failure is compensated on maximally tolerated dose beta-blocker and Entresto along with his loop diuretic.  Recheck renal function potassium proBNP continue current treatment 2. BP at target on guideline directed therapy 3. Rate controlled continue beta-blocker with heart failure 4. Continue reduced dose anticoagulant with age 39. Stable CAD no angina on current medical treatment he has not on a statin agent 93 and I would not initiate one   Next appointment: 6 months   Medication Adjustments/Labs and Tests Ordered: Current medicines are reviewed at length with the patient today.  Concerns regarding medicines are outlined above.  Orders Placed This Encounter  Procedures  . Basic metabolic panel  . Lipid panel  . Pro b natriuretic peptide (BNP)   No orders of the defined types were placed in this encounter.   Chief Complaint  Patient presents with  . Follow-up    History of Present Illness:    [name] is a 87 y.o. male with a hx of chronic systolic heart failure ejection fraction 20-25%, hypertensive heart disease permanent atrial fibrillation chronic anticoagulation stage IIIa CKD hyperlipidemia and CAD.  He was last seen 11/28/2020 after moving into assisted living in September.  Compliance with diet, lifestyle and medications:  Yes  He has made the transition without difficulty his weight is unchanged no shortness of breath chest pain palpitation or syncope and pleased with the quality of his life. Past Medical History:  Diagnosis Date  . Anaphylactic reaction 06/02/2016  . APPENDECTOMY, HX OF 02/25/2008   Qualifier: Diagnosis of  By: [place], [place]    . Bilateral carpal tunnel syndrome 08/07/2016  . Bilateral hearing loss due to cerumen impaction 04/02/2018  . Bilateral impacted cerumen 10/27/2018  . Bilateral leg weakness 01/25/2019  . BPH (benign prostatic hyperplasia)   . BPH with obstruction/lower urinary tract symptoms 02/25/2008   Qualifier: Diagnosis of  By: [place], [place]    . CARPAL TUNNEL RELEASE, RIGHT, HX OF 02/25/2008   Qualifier: Diagnosis of  By: [place], [place]    . Chronic sinus bradycardia   . Chronic systolic (congestive) heart failure ([place])   . CKD (chronic kidney disease) stage 3, GFR 30-59 ml/min (HCC) 06/02/2016  . CKD (chronic kidney disease), stage III ([place])   . Coronary artery disease   . Coronary artery disease involving native coronary artery of native heart with angina pectoris ([place]) 02/25/2008   Qualifier: Diagnosis of  By: [place], [place]    . Cough 11/03/2008   Qualifier: Diagnosis of  By: Linda Hedges MD, [place] DISEASE, CERVICAL SPINE 02/25/2008   Qualifier: Diagnosis of  By: [place], [place]    . Diverticulosis   . DIVERTICULOSIS, COLON 02/25/2008   Qualifier: Diagnosis of  By: [place], [place]    .  GERD 02/25/2008   Qualifier: Diagnosis of  By: Danny Lawless CMA, [place]    . GERD (gastroesophageal reflux disease)   . Gross hematuria 01/28/2017  . Hip dislocation 01/31/2012  . HLD (hyperlipidemia) 02/25/2008   Qualifier: Diagnosis of  By: Danny Lawless CMA, [place]    . Hypercholesterolemia   . HYPERGLYCEMIA 02/25/2008   Qualifier: History of  By: Danny Lawless CMA, [place]    . Hypertension   . Hypertensive heart disease with heart failure ([place])  02/25/2008   Qualifier: Diagnosis of  By: [place], [place]    . IBS (irritable bowel syndrome)   . Ischemic cardiomyopathy 09/21/2018  . Kidney stone   . Left hip pain    Chronic left hip discomfort  . Lipoma of back 08/06/2016  . Mechanical instability of hip prosthesis ([place]) 09/06/2015  . MYOCARDIAL INFARCTION 02/25/2008   Qualifier: History of  By: [place], [place]    . Myocardial infarction [facility]) 2001   Previous anterolateral myocardial infarction with presistent ST-T wave changes  . NEPHROLITHIASIS, HX OF 02/25/2008   Qualifier: Diagnosis of  By: [place], [place]    . Osteoarthritis   . Paroxysmal atrial fibrillation ([place]) 09/21/2018  . PERCUTANEOUS TRANSLUMINAL CORONARY ANGIOPLASTY, HX OF 02/25/2008   Qualifier: Diagnosis of  By: [place], [place]    . Primary osteoarthritis of both first carpometacarpal joints 08/07/2016  . Primary osteoarthritis of both hands 08/07/2016  . Renal calculus 06/30/2013  . Sinus bradycardia 11/14/2014  . Sleep apnea    uses cpap  . SVT (supraventricular tachycardia) ([place])   . TRIGGER FINGER 02/25/2008   Annotation: right index finger Qualifier: History of  By: Danny Lawless CMA, [place]    . Tubular adenoma   . TUBULOVILLOUS ADENOMA, COLON 02/25/2008   Qualifier: History of  By: [place], [place]    . Unspecified personal history presenting hazards to health 02/25/2008   Centricity Description: POLYPECTOMY, HX OF Qualifier: Diagnosis of  By: Danny Lawless CMA, [place]   Centricity Description: PROSTATECTOMY, TRANSURETHRAL, HX OF Qualifier: Diagnosis of  By: [place], [place]      Past Surgical History:  Procedure Laterality Date  . ANGIOPLASTY  2002   with stent and angioplasty in 2003  . APPENDECTOMY    . BACK SURGERY    . CARDIAC CATHETERIZATION  05/07/2002   Ejection fraction 20%  . CARPAL TUNNEL RELEASE Left 08/29/2016   Procedure: LEFT CARPAL TUNNEL RELEASE;  Surgeon: [name], MD;  Location: [facility];  Service: Orthopedics;   Laterality: Left;  REG/FAB  . FINGER SURGERY    . HIP CLOSED REDUCTION  01/31/2012   Procedure: CLOSED MANIPULATION HIP;  Surgeon: [name], MD;  Location: [facility];  Service: Orthopedics;  Laterality: Right;  closed reduction right dislocated hip  . HIP CLOSED REDUCTION  10/10/2012   Procedure: CLOSED MANIPULATION HIP;  Surgeon: [name], MD;  Location: [facility];  Service: Orthopedics;  Laterality: Right;  . HIP CLOSED REDUCTION Right 03/17/2015   Procedure: CLOSED REDUCTION HIP;  Surgeon: Rod Can, MD;  Location: [facility];  Service: Orthopedics;  Laterality: Right;  . JOINT REPLACEMENT     BILATERAL HIP REPLACEMENTS  . TONSILLECTOMY    . TOTAL HIP REVISION Right 09/06/2015   Procedure: RIGHT TOTAL HIP ACETABULAR LINER REVISION (CONSTRAINED LINER);  Surgeon: [name], MD;  Location: [facility];  Service: Orthopedics;  Laterality: Right;  . TRANSURETHRAL RESECTION OF PROSTATE      Current Medications: Current Meds  Medication Sig  . donepezil (  ARICEPT) 10 MG tablet Take 10 mg by mouth at bedtime.  [name] ELIQUIS 2.5 MG TABS tablet TAKE 1 TABLET BY MOUTH TWICE A DAY  . ferrous sulfate 325 (65 FE) MG tablet Take 325 mg by mouth 2 (two) times daily with a meal.   . LORazepam (ATIVAN) 0.5 MG tablet Take 0.5 mg by mouth daily as needed.  . metoprolol succinate (TOPROL-XL) 25 MG 24 hr tablet Take 1 tablet by mouth daily.  . sacubitril-valsartan (ENTRESTO) 49-51 MG Take 1 tablet by mouth 2 (two) times daily.  . tamsulosin (FLOMAX) 0.4 MG CAPS capsule Take 1 capsule by mouth daily.  [name] torsemide (DEMADEX) 20 MG tablet Take 1 tablet (20 mg total) by mouth daily.     Allergies:   Beef-derived products, Acetaminophen, Amiodarone, Prilosec [omeprazole], and Prednisone   Social History   Socioeconomic History  . Marital status: Married    Spouse name: Not on file  . Number of children: Not on file  . Years of education: Not on file  . Highest education level: Not on file  Occupational  History  . Not on file  Tobacco Use  . Smoking status: Never Smoker  . Smokeless tobacco: Never Used  Vaping Use  . Vaping Use: Never used  Substance and Sexual Activity  . Alcohol use: No  . Drug use: No  . Sexual activity: Never  Other Topics Concern  . Not on file  Social History Narrative  . Not on file   Social Determinants of Health   Financial Resource Strain: Not on file  Food Insecurity: Not on file  Transportation Needs: Not on file  Physical Activity: Not on file  Stress: Not on file  Social Connections: Not on file     Family History: The patient's family history includes Alzheimer's disease in his mother; Cancer in his sister; Heart attack in his father; Stroke in his father and mother. ROS:   Please see the history of present illness.    All other systems reviewed and are negative.  EKGs/Labs/Other Studies Reviewed:    The following studies were reviewed today:   Recent Labs: 08/30/2020: Hemoglobin 14.6; Platelets 203 11/28/2020: BUN 29; Creatinine, Ser 1.61; NT-Pro BNP 6,162; Potassium 4.9; Sodium 142  Recent Lipid Panel    Component Value Date/Time   CHOL 199 07/28/2019 0951   TRIG 105 07/28/2019 0951   HDL 49 07/28/2019 0951   CHOLHDL 4.1 07/28/2019 0951   CHOLHDL 2.5 01/15/2016 1104   VLDL 13 01/15/2016 1104   LDLCALC 129 (H) 07/28/2019 0951    Physical Exam:    VS:  BP 110/80 (BP Location: Right Arm, Patient Position: Sitting, Cuff Size: Normal)   Pulse 74   Ht 5\' 7"  (1.702 m)   Wt 167 lb (75.8 kg)   SpO2 95%   BMI 26.16 kg/m     Wt Readings from Last 3 Encounters:  03/27/21 167 lb (75.8 kg)  11/28/20 165 lb (74.8 kg)  08/30/20 157 lb (71.2 kg)     GEN:  Well nourished, well developed in no acute distress HEENT: Normal NECK: No JVD; No carotid bruits LYMPHATICS: No lymphadenopathy CARDIAC: Irregular rhythm  no murmurs, rubs, gallops RESPIRATORY:  Clear to auscultation without rales, wheezing or rhonchi  ABDOMEN: Soft,  non-tender, non-distended MUSCULOSKELETAL:  No edema; No deformity  SKIN: Warm and dry NEUROLOGIC:  Alert and oriented x 3 PSYCHIATRIC:  Normal affect    Signed, Shirlee More, MD  03/27/2021 10:54 AM    [facility]

## 2021-03-27 ENCOUNTER — Ambulatory Visit (INDEPENDENT_AMBULATORY_CARE_PROVIDER_SITE_OTHER): Payer: Medicare Other | Admitting: Cardiology

## 2021-03-27 ENCOUNTER — Encounter: Payer: Self-pay | Admitting: Cardiology

## 2021-03-27 ENCOUNTER — Other Ambulatory Visit: Payer: Self-pay

## 2021-03-27 VITALS — BP 110/80 | HR 74 | Ht 67.0 in | Wt 167.0 lb

## 2021-03-27 DIAGNOSIS — Z7901 Long term (current) use of anticoagulants: Secondary | ICD-10-CM | POA: Diagnosis not present

## 2021-03-27 DIAGNOSIS — I110 Hypertensive heart disease with heart failure: Secondary | ICD-10-CM

## 2021-03-27 DIAGNOSIS — I25119 Atherosclerotic heart disease of native coronary artery with unspecified angina pectoris: Secondary | ICD-10-CM

## 2021-03-27 DIAGNOSIS — I5022 Chronic systolic (congestive) heart failure: Secondary | ICD-10-CM

## 2021-03-27 DIAGNOSIS — I4821 Permanent atrial fibrillation: Secondary | ICD-10-CM | POA: Diagnosis not present

## 2021-03-27 DIAGNOSIS — N1832 Chronic kidney disease, stage 3b: Secondary | ICD-10-CM

## 2021-03-27 NOTE — Patient Instructions (Signed)
Medication Instructions:  Your physician recommends that you continue on your current medications as directed. Please refer to the Current Medication list given to you today.  *If you need a refill on your cardiac medications before your next appointment, please call your pharmacy*   Lab Work: Your physician recommends that you return for lab work in: TODAY CMP, Lipids, ProBNP If you have labs (blood work) drawn today and your tests are completely normal, you will receive your results only by: . MyChart Message (if you have MyChart) OR . A paper copy in the mail If you have any lab test that is abnormal or we need to change your treatment, we will call you to review the results.   Testing/Procedures: None   Follow-Up: At [facility], you and your health needs are our priority.  As part of our continuing mission to provide you with exceptional heart care, we have created designated Provider Care Teams.  These Care Teams include your primary Cardiologist (physician) and Advanced Practice Providers (APPs -  Physician Assistants and Nurse Practitioners) who all work together to provide you with the care you need, when you need it.  We recommend signing up for the patient portal called "MyChart".  Sign up information is provided on this After Visit Summary.  MyChart is used to connect with patients for Virtual Visits (Telemedicine).  Patients are able to view lab/test results, encounter notes, upcoming appointments, etc.  Non-urgent messages can be sent to your provider as well.   To learn more about what you can do with MyChart, go to [email or web address]://[email or web address].com.    Your next appointment:   6 month(s)  The format for your next appointment:   In Person  Provider:   Brian Munley, MD   Other Instructions   

## 2021-03-28 ENCOUNTER — Telehealth: Payer: Self-pay

## 2021-03-28 LAB — BASIC METABOLIC PANEL
BUN/Creatinine Ratio: 22 (ref 10–24)
BUN: 37 mg/dL — ABNORMAL HIGH (ref 10–36)
CO2: 25 mmol/L (ref 20–29)
Calcium: 9.6 mg/dL (ref 8.6–10.2)
Chloride: 101 mmol/L (ref 96–106)
Creatinine, Ser: 1.69 mg/dL — ABNORMAL HIGH (ref 0.76–1.27)
Glucose: 118 mg/dL — ABNORMAL HIGH (ref 65–99)
Potassium: 5 mmol/L (ref 3.5–5.2)
Sodium: 143 mmol/L (ref 134–144)
eGFR: 37 mL/min/{1.73_m2} — ABNORMAL LOW (ref >59)

## 2021-03-28 LAB — LIPID PANEL
Chol/HDL Ratio: 4.6 ratio (ref 0.0–5.0)
Cholesterol, Total: 245 mg/dL — ABNORMAL HIGH (ref 100–199)
HDL: 53 mg/dL (ref >39)
LDL Chol Calc (NIH): 168 mg/dL — ABNORMAL HIGH (ref 0–99)
Triglycerides: 133 mg/dL (ref 0–149)
VLDL Cholesterol Cal: 24 mg/dL (ref 5–40)

## 2021-03-28 LAB — PRO B NATRIURETIC PEPTIDE: NT-Pro BNP: 6477 pg/mL — ABNORMAL HIGH (ref 0–486)

## 2021-03-28 NOTE — Telephone Encounter (Signed)
Spoke with patient regarding results and recommendation.  Patient verbalizes understanding and is agreeable to plan of care. Advised patient to call back with any issues or concerns.  

## 2021-04-08 ENCOUNTER — Inpatient Hospital Stay (HOSPITAL_COMMUNITY)
Admission: EM | Admit: 2021-04-08 | Discharge: 2021-04-11 | DRG: 536 | Disposition: A | Discharge status: Skilled Nursing Facility | Payer: Medicare Other | Source: Skilled Nursing Facility | Attending: Internal Medicine | Admitting: Internal Medicine

## 2021-04-08 ENCOUNTER — Encounter (HOSPITAL_COMMUNITY): Payer: Self-pay

## 2021-04-08 ENCOUNTER — Emergency Department (HOSPITAL_COMMUNITY): Payer: Medicare Other

## 2021-04-08 ENCOUNTER — Other Ambulatory Visit: Payer: Self-pay

## 2021-04-08 DIAGNOSIS — W010XXA Fall on same level from slipping, tripping and stumbling without subsequent striking against object, initial encounter: Secondary | ICD-10-CM | POA: Diagnosis not present

## 2021-04-08 DIAGNOSIS — I255 Ischemic cardiomyopathy: Secondary | ICD-10-CM | POA: Diagnosis present

## 2021-04-08 DIAGNOSIS — Z8249 Family history of ischemic heart disease and other diseases of the circulatory system: Secondary | ICD-10-CM

## 2021-04-08 DIAGNOSIS — S72115A Nondisplaced fracture of greater trochanter of left femur, initial encounter for closed fracture: Principal | ICD-10-CM | POA: Diagnosis present

## 2021-04-08 DIAGNOSIS — Z823 Family history of stroke: Secondary | ICD-10-CM | POA: Diagnosis not present

## 2021-04-08 DIAGNOSIS — Z23 Encounter for immunization: Secondary | ICD-10-CM | POA: Diagnosis present

## 2021-04-08 DIAGNOSIS — M9702XA Periprosthetic fracture around internal prosthetic left hip joint, initial encounter: Secondary | ICD-10-CM | POA: Diagnosis present

## 2021-04-08 DIAGNOSIS — I480 Paroxysmal atrial fibrillation: Secondary | ICD-10-CM | POA: Diagnosis present

## 2021-04-08 DIAGNOSIS — D689 Coagulation defect, unspecified: Secondary | ICD-10-CM | POA: Diagnosis not present

## 2021-04-08 DIAGNOSIS — M978XXA Periprosthetic fracture around other internal prosthetic joint, initial encounter: Secondary | ICD-10-CM

## 2021-04-08 DIAGNOSIS — Z20822 Contact with and (suspected) exposure to covid-19: Secondary | ICD-10-CM | POA: Diagnosis present

## 2021-04-08 DIAGNOSIS — M978XXD Periprosthetic fracture around other internal prosthetic joint, subsequent encounter: Secondary | ICD-10-CM | POA: Diagnosis not present

## 2021-04-08 DIAGNOSIS — I252 Old myocardial infarction: Secondary | ICD-10-CM | POA: Diagnosis not present

## 2021-04-08 DIAGNOSIS — Z96649 Presence of unspecified artificial hip joint: Secondary | ICD-10-CM

## 2021-04-08 DIAGNOSIS — Z820 Family history of epilepsy and other diseases of the nervous system: Secondary | ICD-10-CM

## 2021-04-08 DIAGNOSIS — I5022 Chronic systolic (congestive) heart failure: Secondary | ICD-10-CM | POA: Diagnosis present

## 2021-04-08 DIAGNOSIS — K219 Gastro-esophageal reflux disease without esophagitis: Secondary | ICD-10-CM | POA: Diagnosis present

## 2021-04-08 DIAGNOSIS — K589 Irritable bowel syndrome without diarrhea: Secondary | ICD-10-CM | POA: Diagnosis present

## 2021-04-08 DIAGNOSIS — Z955 Presence of coronary angioplasty implant and graft: Secondary | ICD-10-CM | POA: Diagnosis not present

## 2021-04-08 DIAGNOSIS — N401 Enlarged prostate with lower urinary tract symptoms: Secondary | ICD-10-CM | POA: Diagnosis present

## 2021-04-08 DIAGNOSIS — Y831 Surgical operation with implant of artificial internal device as the cause of abnormal reaction of the patient, or of later complication, without mention of misadventure at the time of the procedure: Secondary | ICD-10-CM | POA: Diagnosis not present

## 2021-04-08 DIAGNOSIS — Z9079 Acquired absence of other genital organ(s): Secondary | ICD-10-CM | POA: Diagnosis not present

## 2021-04-08 DIAGNOSIS — Z96643 Presence of artificial hip joint, bilateral: Secondary | ICD-10-CM | POA: Diagnosis present

## 2021-04-08 DIAGNOSIS — N1832 Chronic kidney disease, stage 3b: Secondary | ICD-10-CM | POA: Diagnosis present

## 2021-04-08 DIAGNOSIS — Z79899 Other long term (current) drug therapy: Secondary | ICD-10-CM

## 2021-04-08 DIAGNOSIS — E785 Hyperlipidemia, unspecified: Secondary | ICD-10-CM | POA: Diagnosis present

## 2021-04-08 DIAGNOSIS — J9601 Acute respiratory failure with hypoxia: Secondary | ICD-10-CM

## 2021-04-08 DIAGNOSIS — Z91014 Allergy to mammalian meats: Secondary | ICD-10-CM

## 2021-04-08 DIAGNOSIS — I2510 Atherosclerotic heart disease of native coronary artery without angina pectoris: Secondary | ICD-10-CM | POA: Diagnosis present

## 2021-04-08 DIAGNOSIS — Z7901 Long term (current) use of anticoagulants: Secondary | ICD-10-CM

## 2021-04-08 DIAGNOSIS — Z888 Allergy status to other drugs, medicaments and biological substances status: Secondary | ICD-10-CM

## 2021-04-08 DIAGNOSIS — G4730 Sleep apnea, unspecified: Secondary | ICD-10-CM | POA: Diagnosis present

## 2021-04-08 DIAGNOSIS — I130 Hypertensive heart and chronic kidney disease with heart failure and stage 1 through stage 4 chronic kidney disease, or unspecified chronic kidney disease: Secondary | ICD-10-CM | POA: Diagnosis present

## 2021-04-08 DIAGNOSIS — F419 Anxiety disorder, unspecified: Secondary | ICD-10-CM | POA: Diagnosis present

## 2021-04-08 DIAGNOSIS — F0390 Unspecified dementia without behavioral disturbance: Secondary | ICD-10-CM | POA: Diagnosis present

## 2021-04-08 DIAGNOSIS — Z8673 Personal history of transient ischemic attack (TIA), and cerebral infarction without residual deficits: Secondary | ICD-10-CM

## 2021-04-08 DIAGNOSIS — Z886 Allergy status to analgesic agent status: Secondary | ICD-10-CM

## 2021-04-08 LAB — BASIC METABOLIC PANEL
Anion gap: 7 (ref 5–15)
BUN: 24 mg/dL — ABNORMAL HIGH (ref 8–23)
CO2: 26 mmol/L (ref 22–32)
Calcium: 9.2 mg/dL (ref 8.9–10.3)
Chloride: 106 mmol/L (ref 98–111)
Creatinine, Ser: 1.44 mg/dL — ABNORMAL HIGH (ref 0.61–1.24)
GFR, Estimated: 45 mL/min — ABNORMAL LOW (ref >60)
Glucose, Bld: 127 mg/dL — ABNORMAL HIGH (ref 70–99)
Potassium: 4.4 mmol/L (ref 3.5–5.1)
Sodium: 139 mmol/L (ref 135–145)

## 2021-04-08 LAB — CBC WITH DIFFERENTIAL/PLATELET
Abs Immature Granulocytes: 0.1 10*3/uL — ABNORMAL HIGH (ref 0.00–0.07)
Basophils Absolute: 0.1 10*3/uL (ref 0.0–0.1)
Basophils Relative: 1 %
Eosinophils Absolute: 0.2 10*3/uL (ref 0.0–0.5)
Eosinophils Relative: 3 %
HCT: 38.5 % — ABNORMAL LOW (ref 39.0–52.0)
Hemoglobin: 12.1 g/dL — ABNORMAL LOW (ref 13.0–17.0)
Immature Granulocytes: 1 %
Lymphocytes Relative: 12 %
Lymphs Abs: 1 10*3/uL (ref 0.7–4.0)
MCH: 30.7 pg (ref 26.0–34.0)
MCHC: 31.4 g/dL (ref 30.0–36.0)
MCV: 97.7 fL (ref 80.0–100.0)
Monocytes Absolute: 1.1 10*3/uL — ABNORMAL HIGH (ref 0.1–1.0)
Monocytes Relative: 13 %
Neutro Abs: 6 10*3/uL (ref 1.7–7.7)
Neutrophils Relative %: 70 %
Platelets: 174 10*3/uL (ref 150–400)
RBC: 3.94 MIL/uL — ABNORMAL LOW (ref 4.22–5.81)
RDW: 14.6 % (ref 11.5–15.5)
WBC: 8.5 10*3/uL (ref 4.0–10.5)
nRBC: 0 % (ref 0.0–0.2)

## 2021-04-08 LAB — RESP PANEL BY RT-PCR (FLU A&B, COVID) ARPGX2
Influenza A by PCR: NEGATIVE
Influenza B by PCR: NEGATIVE
SARS Coronavirus 2 by RT PCR: NEGATIVE

## 2021-04-08 MED ORDER — METOPROLOL SUCCINATE ER 25 MG PO TB24
25.0000 mg | ORAL_TABLET | Freq: Every day | ORAL | Status: DC
  Administered 2021-04-09 – 2021-04-11 (×3): 25 mg via ORAL
  Filled 2021-04-08 (×3): qty 1

## 2021-04-08 MED ORDER — APIXABAN 5 MG PO TABS
5.0000 mg | ORAL_TABLET | Freq: Two times a day (BID) | ORAL | Status: DC
  Administered 2021-04-09 – 2021-04-11 (×5): 5 mg via ORAL
  Filled 2021-04-08 (×5): qty 1

## 2021-04-08 MED ORDER — POLYETHYLENE GLYCOL 3350 17 G PO PACK: 17.0000 g | PACK | Freq: Every day | ORAL | Status: DC | PRN

## 2021-04-08 MED ORDER — OXYCODONE HCL 5 MG PO TABS
5.0000 mg | ORAL_TABLET | Freq: Once | ORAL | Status: AC
  Administered 2021-04-08: 5 mg via ORAL
  Filled 2021-04-08: qty 1

## 2021-04-08 MED ORDER — ACETAMINOPHEN 325 MG PO TABS
650.0000 mg | ORAL_TABLET | Freq: Four times a day (QID) | ORAL | Status: DC
  Administered 2021-04-08 – 2021-04-11 (×8): 650 mg via ORAL
  Filled 2021-04-08 (×8): qty 2

## 2021-04-08 MED ORDER — FENTANYL CITRATE (PF) 100 MCG/2ML IJ SOLN: 25.0000 ug | INTRAMUSCULAR | Status: DC | PRN

## 2021-04-08 MED ORDER — ESCITALOPRAM OXALATE 10 MG PO TABS
10.0000 mg | ORAL_TABLET | Freq: Every day | ORAL | Status: DC
  Administered 2021-04-09 – 2021-04-11 (×3): 10 mg via ORAL
  Filled 2021-04-08 (×3): qty 1

## 2021-04-08 MED ORDER — MORPHINE SULFATE (PF) 4 MG/ML IV SOLN
4.0000 mg | Freq: Once | INTRAVENOUS | Status: DC
Start: 2021-04-08 — End: 2021-04-09

## 2021-04-08 MED ORDER — OXYCODONE HCL 5 MG PO TABS: 2.5000 mg | ORAL_TABLET | ORAL | Status: DC | PRN

## 2021-04-08 MED ORDER — TETANUS-DIPHTH-ACELL PERTUSSIS 5-2.5-18.5 LF-MCG/0.5 IM SUSY
0.5000 mL | PREFILLED_SYRINGE | Freq: Once | INTRAMUSCULAR | Status: AC
  Administered 2021-04-08: 0.5 mL via INTRAMUSCULAR
  Filled 2021-04-08: qty 0.5

## 2021-04-08 MED ORDER — LORAZEPAM 0.5 MG PO TABS
0.5000 mg | ORAL_TABLET | Freq: Two times a day (BID) | ORAL | Status: DC
  Administered 2021-04-09 – 2021-04-11 (×5): 0.5 mg via ORAL
  Filled 2021-04-08 (×5): qty 1

## 2021-04-08 MED ORDER — OXYCODONE HCL 5 MG PO TABS
5.0000 mg | ORAL_TABLET | Freq: Once | ORAL | Status: AC
Start: 2021-04-08 — End: 2021-04-08
  Administered 2021-04-08: 5 mg via ORAL
  Filled 2021-04-08: qty 1

## 2021-04-08 MED ORDER — SACUBITRIL-VALSARTAN 49-51 MG PO TABS
1.0000 | ORAL_TABLET | Freq: Every day | ORAL | Status: DC
  Administered 2021-04-09 – 2021-04-11 (×3): 1 via ORAL
  Filled 2021-04-08 (×3): qty 1

## 2021-04-08 MED ORDER — DONEPEZIL HCL 10 MG PO TABS
10.0000 mg | ORAL_TABLET | Freq: Every day | ORAL | Status: DC
  Administered 2021-04-08 – 2021-04-10 (×3): 10 mg via ORAL
  Filled 2021-04-08 (×3): qty 1

## 2021-04-08 MED ORDER — OXYCODONE HCL 5 MG PO TABS: 5.0000 mg | ORAL_TABLET | Freq: Four times a day (QID) | ORAL | 0 refills | Status: DC | PRN

## 2021-04-08 MED ORDER — SENNA 8.6 MG PO TABS
1.0000 | ORAL_TABLET | Freq: Two times a day (BID) | ORAL | Status: DC
  Administered 2021-04-08 – 2021-04-11 (×5): 8.6 mg via ORAL
  Filled 2021-04-08 (×5): qty 1

## 2021-04-08 MED ORDER — TORSEMIDE 20 MG PO TABS
20.0000 mg | ORAL_TABLET | Freq: Every day | ORAL | Status: DC
  Administered 2021-04-09 – 2021-04-11 (×3): 20 mg via ORAL
  Filled 2021-04-08 (×3): qty 1

## 2021-04-08 MED ORDER — TAMSULOSIN HCL 0.4 MG PO CAPS
0.4000 mg | ORAL_CAPSULE | Freq: Every day | ORAL | Status: DC
  Administered 2021-04-09 – 2021-04-11 (×3): 0.4 mg via ORAL
  Filled 2021-04-08 (×3): qty 1

## 2021-04-08 NOTE — Progress Notes (Signed)
Received patient from ED awake,alert and oriented to self and time. Patient disoriented to place but easily reoriented at this time. Patient able to verbalize needs. NAD noted; respirations even on 2L O2 via nasal cannula. Movement/sensation noted to all extremities. Skin tear noted to R elbow, clensed and new dressing applied. Continuous pulse ox connected to patient at this time. Oriented to room and floor. Whiteboard updated. All safety measures in place, call light within reach, bed locked and in lowest position and bed alarm in zone 2 activated.

## 2021-04-08 NOTE — ED Triage Notes (Signed)
BIB EMS for witnessed fall. Pt tripped on a carpert and fell on a carpet. No LOC, on eliquis. GCS 15, a&o x4. No shortening of BLE. Complain of bilateral hip pain. Laceration at right elbow.

## 2021-04-08 NOTE — ED Notes (Signed)
Patient transported to CT 

## 2021-04-08 NOTE — Discharge Instructions (Addendum)
[name] has a small fracture of the greater trochanter. He is able to bear weight as tolerated with the walker. If the pain becomes unbearable despite pain medicine or he can't walk/bear weight then he needs to return to the ER. Otherwise follow up with his orthopedist in 1 week

## 2021-04-08 NOTE — ED Notes (Signed)
Secretary arranged PTAR.

## 2021-04-08 NOTE — ED Provider Notes (Signed)
  Physical Exam  BP 128/79 (BP Location: Right Arm)   Pulse 84   Temp 98 F (36.7 C) (Oral)   Resp 19   SpO2 94%   Physical Exam  ED Course/Procedures     Procedures  MDM  Patient had a loose screw on initial x-ray.  There was a discussion with orthopedic doctor as well as family.  Patient was able to bear weight initially so was reasonable for him to be discharged to the facility.  However he has progressive pain and unable to bear weight now.  Therefore CT was performed and showed a periprosthetic fracture.  I discussed case with Dr. Alvan Dame from ortho again.  Also his daughter is at bedside. Patient is now requiring IV pain meds.  He states that with the will see patient and recommend pain control and nonoperative approach with physical therapy.      Drenda Freeze, MD 04/08/21 2109

## 2021-04-08 NOTE — ED Notes (Signed)
Ambulated the patient with a walker and assistance from this RN. Pt was able to bear weight on left leg but he kept complaining of pain. His left knee also looked like it was going to buckle in with every step. Pt was taken back to bed.

## 2021-04-08 NOTE — ED Notes (Signed)
Called for PTAR at 15:40

## 2021-04-08 NOTE — H&P (Signed)
History and Physical    [name] WUX:[id number] DOB: 07/17/1925 DOA: 04/08/2021  PCP: Isidor Holts   Patient coming from: ALF   Chief Complaint: Hip pain after trip and fall   HPI: [name] is a 87 y.o. male with medical history significant for atrial fibrillation on Eliquis, chronic kidney disease stage IIIb, chronic systolic CHF, dementia, anxiety, coronary artery disease, and history of CVA, now presenting to the emergency department with hip pain after a fall.  Staff at the patient's ALF saw him tripped on some carpeting and fell to the ground without losing consciousness.  Patient was complaining of bilateral hip pain and brought into the ED.  He is unable to contribute much to the history due to his dementia.  ED Course: Upon arrival to the ED, patient is found to be afebrile, saturating low to mid 90s on room air, and with stable blood pressure.  Chemistry panel notable for creatinine of 1.44.  CBC unremarkable.  CT head negative for acute intracranial abnormality.  Radiographs of the right elbow are negative for acute fracture.  Patient has severe pain and difficulty bearing weight on the left leg.  CT of the left hip demonstrates acute periprosthetic fracture about the left hip arthroplasty.  Orthopedic surgery recommended nonoperative management.  Patient was given 2 doses of oxycodone and a dose of IV morphine in the emergency department.  Review of Systems:  Unable to complete ROS due to the patient's clinical condition.  Past Medical History:  Diagnosis Date  . Anaphylactic reaction 06/02/2016  . APPENDECTOMY, HX OF 02/25/2008   Qualifier: Diagnosis of  By: [place], [place]    . Bilateral carpal tunnel syndrome 08/07/2016  . Bilateral hearing loss due to cerumen impaction 04/02/2018  . Bilateral impacted cerumen 10/27/2018  . Bilateral leg weakness 01/25/2019  . BPH (benign prostatic hyperplasia)   . BPH with obstruction/lower urinary tract  symptoms 02/25/2008   Qualifier: Diagnosis of  By: [place], [place]    . CARPAL TUNNEL RELEASE, RIGHT, HX OF 02/25/2008   Qualifier: Diagnosis of  By: [place], [place]    . Chronic sinus bradycardia   . Chronic systolic (congestive) heart failure ([place])   . CKD (chronic kidney disease) stage 3, GFR 30-59 ml/min (HCC) 06/02/2016  . CKD (chronic kidney disease), stage III ([place])   . Coronary artery disease   . Coronary artery disease involving native coronary artery of native heart with angina pectoris ([place]) 02/25/2008   Qualifier: Diagnosis of  By: [place], [place]    . Cough 11/03/2008   Qualifier: Diagnosis of  By: Linda Hedges MD, [place] DISEASE, CERVICAL SPINE 02/25/2008   Qualifier: Diagnosis of  By: [place], [place]    . Diverticulosis   . DIVERTICULOSIS, COLON 02/25/2008   Qualifier: Diagnosis of  By: Jan [place], [place]    . GERD 02/25/2008   Qualifier: Diagnosis of  By: Danny Lawless CMA, [place]    . GERD (gastroesophageal reflux disease)   . Gross hematuria 01/28/2017  . Hip dislocation 01/31/2012  . HLD (hyperlipidemia) 02/25/2008   Qualifier: Diagnosis of  By: Danny Lawless CMA, [place]    . Hypercholesterolemia   . HYPERGLYCEMIA 02/25/2008   Qualifier: History of  By: Danny Lawless CMA, [place]    . Hypertension   . Hypertensive heart disease with heart failure ([place]) 02/25/2008   Qualifier: Diagnosis of  By: [place], [place]    . IBS (irritable bowel syndrome)   . Ischemic  cardiomyopathy 09/21/2018  . Kidney stone   . Left hip pain    Chronic left hip discomfort  . Lipoma of back 08/06/2016  . Mechanical instability of hip prosthesis ([place]) 09/06/2015  . MYOCARDIAL INFARCTION 02/25/2008   Qualifier: History of  By: [place], [place]    . Myocardial infarction [facility]) 2001   Previous anterolateral myocardial infarction with presistent ST-T wave changes  . NEPHROLITHIASIS, HX OF 02/25/2008   Qualifier: Diagnosis of  By: [place], [place]    . Osteoarthritis   .  Paroxysmal atrial fibrillation ([place]) 09/21/2018  . PERCUTANEOUS TRANSLUMINAL CORONARY ANGIOPLASTY, HX OF 02/25/2008   Qualifier: Diagnosis of  By: [place], [place]    . Primary osteoarthritis of both first carpometacarpal joints 08/07/2016  . Primary osteoarthritis of both hands 08/07/2016  . Renal calculus 06/30/2013  . Sinus bradycardia 11/14/2014  . Sleep apnea    uses cpap  . SVT (supraventricular tachycardia) ([place])   . TRIGGER FINGER 02/25/2008   Annotation: right index finger Qualifier: History of  By: Danny Lawless CMA, [place]    . Tubular adenoma   . TUBULOVILLOUS ADENOMA, COLON 02/25/2008   Qualifier: History of  By: [place], [place]    . Unspecified personal history presenting hazards to health 02/25/2008   Centricity Description: POLYPECTOMY, HX OF Qualifier: Diagnosis of  By: Danny Lawless CMA, [place]   Centricity Description: PROSTATECTOMY, TRANSURETHRAL, HX OF Qualifier: Diagnosis of  By: [place], [place]      Past Surgical History:  Procedure Laterality Date  . ANGIOPLASTY  2002   with stent and angioplasty in 2003  . APPENDECTOMY    . BACK SURGERY    . CARDIAC CATHETERIZATION  05/07/2002   Ejection fraction 20%  . CARPAL TUNNEL RELEASE Left 08/29/2016   Procedure: LEFT CARPAL TUNNEL RELEASE;  Surgeon: [name], MD;  Location: [facility];  Service: Orthopedics;  Laterality: Left;  REG/FAB  . FINGER SURGERY    . HIP CLOSED REDUCTION  01/31/2012   Procedure: CLOSED MANIPULATION HIP;  Surgeon: [name], MD;  Location: [facility];  Service: Orthopedics;  Laterality: Right;  closed reduction right dislocated hip  . HIP CLOSED REDUCTION  10/10/2012   Procedure: CLOSED MANIPULATION HIP;  Surgeon: [name], MD;  Location: [facility];  Service: Orthopedics;  Laterality: Right;  . HIP CLOSED REDUCTION Right 03/17/2015   Procedure: CLOSED REDUCTION HIP;  Surgeon: Rod Can, MD;  Location: [facility];  Service: Orthopedics;  Laterality: Right;  . JOINT REPLACEMENT      BILATERAL HIP REPLACEMENTS  . TONSILLECTOMY    . TOTAL HIP REVISION Right 09/06/2015   Procedure: RIGHT TOTAL HIP ACETABULAR LINER REVISION (CONSTRAINED LINER);  Surgeon: [name], MD;  Location: [facility];  Service: Orthopedics;  Laterality: Right;  . TRANSURETHRAL RESECTION OF PROSTATE      Social History:   reports that he has never smoked. He has never used smokeless tobacco. He reports that he does not drink alcohol and does not use drugs.  Allergies  Allergen Reactions  . Beef-Derived Products Hives    Severe hives, nausea, throat swelling  . Acetaminophen Other (See Comments)    urticaria urticaria  . Alpha-Gal     Per mar  . Amiodarone Other (See Comments)    Unknown  Affected my breathing  . Prilosec [Omeprazole]     Diarrhea, stomach cramps  . Prednisone Palpitations    REACTION: causes tachycardia, increase BP    Family History  Problem Relation Age of  Onset  . Heart attack Father   . Stroke Father   . Stroke Mother   . Alzheimer's disease Mother   . Cancer Sister      Prior to Admission medications   Medication Sig Start Date End Date Taking? Authorizing Provider  acetaminophen (TYLENOL) 500 MG tablet Take 250 mg by mouth every 4 (four) hours as needed for mild pain.   Yes [provider]  Dextromethorphan HBr 10 MG/5ML SYRP Take 5 mLs by mouth every 4 (four) hours as needed (cough/elevated temp).   Yes [provider]  donepezil (ARICEPT) 10 MG tablet Take 10 mg by mouth at bedtime.   Yes [provider]  ELIQUIS 2.5 MG TABS tablet TAKE 1 TABLET BY MOUTH TWICE A DAY Patient taking differently: Take 5 mg by mouth 2 (two) times daily. 08/15/20  Yes Richardo Priest, MD  escitalopram (LEXAPRO) 10 MG tablet Take 10 mg by mouth daily. 04/01/21  Yes [provider]  ferrous sulfate 325 (65 FE) MG tablet Take 325 mg by mouth 2 (two) times daily with a meal.    Yes [provider]  loperamide (IMODIUM A-D) 2 MG tablet  Take 2 mg by mouth every 4 (four) hours as needed for diarrhea or loose stools.   Yes [provider]  LORazepam (ATIVAN) 0.5 MG tablet Take 0.5 mg by mouth 2 (two) times daily. 03/06/21  Yes [provider]  metoprolol succinate (TOPROL-XL) 25 MG 24 hr tablet Take 25 mg by mouth daily. 03/09/21  Yes [provider]  oxyCODONE (ROXICODONE) 5 MG immediate release tablet Take 1 tablet (5 mg total) by mouth every 6 (six) hours as needed for severe pain. 04/08/21  Yes Sherwood Gambler, MD  sacubitril-valsartan (ENTRESTO) 49-51 MG Take 1 tablet by mouth 2 (two) times daily. Patient taking differently: Take 1 tablet by mouth daily. 06/14/20  Yes Richardo Priest, MD  tamsulosin (FLOMAX) 0.4 MG CAPS capsule Take 0.4 mg by mouth daily. 10/11/19  Yes [provider]  torsemide (DEMADEX) 20 MG tablet Take 1 tablet (20 mg total) by mouth daily. 04/13/20  Yes Park Liter, MD    Physical Exam: Vitals:   04/08/21 1600 04/08/21 1700 04/08/21 1745 04/08/21 1936  BP: 129/87 133/66 (!) 141/97 128/79  Pulse: 81 84 78 84  Resp: 15 15 16 19   Temp:    98 F (36.7 C)  TempSrc:    Oral  SpO2: 93% 96% 95% 94%    Constitutional: NAD, calm  Eyes: PERTLA, lids and conjunctivae normal ENMT: Mucous membranes are moist. Posterior pharynx clear of any exudate or lesions.   Neck: supple, no masses  Respiratory:  no wheezing, no crackles. No accessory muscle use.  Cardiovascular: Rate ~38, holosystolic murmur at upper sternal border. Trace pitting edema bilateral LEs. Abdomen: No distension, no tenderness, soft. Bowel sounds active.  Musculoskeletal: Left hip tender, neurovascularly intact distally. No joint deformity upper and lower extremities.   Skin: no significant rashes, lesions, ulcers. Warm, dry, well-perfused. Neurologic: No facial asymmetry, no dysarthria, gross hearing deficit. Sensation intact. Moving all extremities.  Psychiatric: Alert and oriented to person only.  Pleasant and cooperative.    Labs and Imaging on Admission: I have personally reviewed following labs and imaging studies  CBC: Recent Labs  Lab 04/08/21 1831  WBC 8.5  NEUTROABS 6.0  HGB 12.1*  HCT 38.5*  MCV 97.7  PLT 101   Basic Metabolic Panel: Recent Labs  Lab 04/08/21 1831  NA  139  K 4.4  CL 106  CO2 26  GLUCOSE 127*  BUN 24*  CREATININE 1.44*  CALCIUM 9.2   GFR: Estimated Creatinine Clearance: 30 mL/min (A) (by C-G formula based on SCr of 1.44 mg/dL (H)). Liver Function Tests: No results for input(s): AST, ALT, ALKPHOS, BILITOT, PROT, ALBUMIN in the last 168 hours. No results for input(s): LIPASE, AMYLASE in the last 168 hours. No results for input(s): AMMONIA in the last 168 hours. Coagulation Profile: No results for input(s): INR, PROTIME in the last 168 hours. Cardiac Enzymes: No results for input(s): CKTOTAL, CKMB, CKMBINDEX, TROPONINI in the last 168 hours. BNP (last 3 results) Recent Labs    08/30/20 1147 11/28/20 1142 03/27/21 1102  PROBNP 5,254* 6,162* 6,477*   HbA1C: No results for input(s): HGBA1C in the last 72 hours. CBG: No results for input(s): GLUCAP in the last 168 hours. Lipid Profile: No results for input(s): CHOL, HDL, LDLCALC, TRIG, CHOLHDL, LDLDIRECT in the last 72 hours. Thyroid Function Tests: No results for input(s): TSH, T4TOTAL, FREET4, T3FREE, THYROIDAB in the last 72 hours. Anemia Panel: No results for input(s): VITAMINB12, FOLATE, FERRITIN, TIBC, IRON, RETICCTPCT in the last 72 hours. Urine analysis:    Component Value Date/Time   COLORURINE YELLOW 06/02/2016 [place] 06/02/2016 0545   LABSPEC 1.024 06/02/2016 0545   PHURINE 5.5 06/02/2016 [place] 06/02/2016 0545   HGBUR NEGATIVE 06/02/2016 0545   BILIRUBINUR NEGATIVE 06/02/2016 0545   KETONESUR NEGATIVE 06/02/2016 0545   PROTEINUR NEGATIVE 06/02/2016 0545   UROBILINOGEN 0.2 08/31/2015 0807   NITRITE NEGATIVE 06/02/2016 0545    LEUKOCYTESUR NEGATIVE 06/02/2016 0545   Sepsis Labs: @LABRCNTIP (procalcitonin:4,lacticidven:4) ) Recent Results (from the past 240 hour(s))  Resp Panel by RT-PCR (Flu A&B, Covid) Nasopharyngeal Swab     Status: None   Collection Time: 04/08/21  6:51 PM   Specimen: Nasopharyngeal Swab; Nasopharyngeal(NP) swabs in vial transport medium  Result Value Ref Range Status   SARS Coronavirus 2 by RT PCR NEGATIVE NEGATIVE Final    Comment: (NOTE) SARS-CoV-2 target nucleic acids are NOT DETECTED.  The SARS-CoV-2 RNA is generally detectable in upper respiratory specimens during the acute phase of infection. The lowest concentration of SARS-CoV-2 viral copies this assay can detect is 138 copies/mL. A negative result does not preclude SARS-Cov-2 infection and should not be used as the sole basis for treatment or other patient management decisions. A negative result may occur with  improper specimen collection/handling, submission of specimen other than nasopharyngeal swab, presence of viral mutation(s) within the areas targeted by this assay, and inadequate number of viral copies(<138 copies/mL). A negative result must be combined with clinical observations, patient history, and epidemiological information. The expected result is Negative.  Fact Sheet for Patients:  EntrepreneurPulse.com.au  Fact Sheet for Healthcare Providers:  IncredibleEmployment.be  This test is no t yet approved or cleared by the [place] FDA and  has been authorized for detection and/or diagnosis of SARS-CoV-2 by FDA under an Emergency Use Authorization (EUA). This EUA will remain  in effect (meaning this test can be used) for the duration of the COVID-19 declaration under Section 564(b)(1) of the Act, 21 U.S.C.section 360bbb-3(b)(1), unless the authorization is terminated  or revoked sooner.       Influenza A by PCR NEGATIVE NEGATIVE Final   Influenza B by PCR NEGATIVE  NEGATIVE Final    Comment: (NOTE) The Xpert Xpress SARS-CoV-2/FLU/RSV plus assay is intended as an aid in the diagnosis of  influenza from Nasopharyngeal swab specimens and should not be used as a sole basis for treatment. Nasal washings and aspirates are unacceptable for Xpert Xpress SARS-CoV-2/FLU/RSV testing.  Fact Sheet for Patients: EntrepreneurPulse.com.au  Fact Sheet for Healthcare Providers: IncredibleEmployment.be  This test is not yet approved or cleared by the [place] FDA and has been authorized for detection and/or diagnosis of SARS-CoV-2 by FDA under an Emergency Use Authorization (EUA). This EUA will remain in effect (meaning this test can be used) for the duration of the COVID-19 declaration under Section 564(b)(1) of the Act, 21 U.S.C. section 360bbb-3(b)(1), unless the authorization is terminated or revoked.  Performed at [facility], [place] [address] [place], [place] [address]      Radiological Exams on Admission: DG Elbow Complete Right  Result Date: 04/08/2021 CLINICAL DATA:  Fall with elbow pain. EXAM: RIGHT ELBOW - COMPLETE 3+ VIEW COMPARISON:  None. FINDINGS: There is no evidence of fracture, dislocation, or joint effusion. There is no evidence of arthropathy or other focal bone abnormality. Soft tissues are unremarkable. IMPRESSION: Negative. Electronically Signed   By: Zerita Boers M.D.   On: 04/08/2021 11:35   CT Head Wo Contrast  Result Date: 04/08/2021 CLINICAL DATA:  Head trauma.  Status post fall. EXAM: CT HEAD WITHOUT CONTRAST TECHNIQUE: Contiguous axial images were obtained from the base of the skull through the vertex without intravenous contrast. COMPARISON:  None. FINDINGS: Brain: No evidence of acute infarction, hemorrhage, hydrocephalus, extra-axial collection or mass lesion/mass effect. Remote right cerebellar hemisphere infarct. There is mild diffuse low-attenuation within the subcortical and  periventricular white matter compatible with chronic microvascular disease. Vascular: No hyperdense vessel or unexpected calcification. Skull: Normal. Negative for fracture or focal lesion. Sinuses/Orbits: No acute finding. Other: None IMPRESSION: 1. No acute intracranial abnormalities. 2. Chronic small vessel ischemic change and brain atrophy. 3. Remote right cerebellar hemisphere infarct. Electronically Signed   By: Kerby Moors M.D.   On: 04/08/2021 11:08   CT PELVIS WO CONTRAST  Result Date: 04/08/2021 CLINICAL DATA:  Left hip pain after injury. Fall. Possible loosening of hardware on radiograph. EXAM: CT PELVIS WITHOUT CONTRAST TECHNIQUE: Multidetector CT imaging of the pelvis was performed following the standard protocol without intravenous contrast. COMPARISON:  Pelvis and radiograph earlier today. Abdominopelvic CT 06/28/2019 FINDINGS: Urinary Tract: Decompressed distal ureters. Partially distended urinary bladder, obscured by streak artifact from bilateral hip arthroplasties. Bowel: Sigmoid diverticulosis without diverticulitis. No acute findings. Vascular/Lymphatic: Aortic and branch atherosclerosis. No pelvic adenopathy. Reproductive: Prostate appears prominent, but is obscured by streak artifact. Other:  Fat in both inguinal canals.  No pelvic free fluid. Musculoskeletal: Left hip arthroplasty. There is an acute periprosthetic fracture involving the lateral aspect of the greater trochanter, extending to the femoral stem. Fracture is mildly displaced. Nondisplaced fracture involvement of the anterior as well as lateral subtrochanteric femur. Underlying lobulated lucency surrounding the femoral stem, as well as acetabular component of left hip arthroplasty appears similar to prior CT were visualized, and consistent with underlying particle disease. No additional pelvic fracture. Pubic rami and bony pelvis are intact. Right hip arthroplasty without evidence of periprosthetic fracture or lucency.  IMPRESSION: 1. Acute periprosthetic fracture about left hip arthroplasty involving the lateral aspect of the greater trochanter, extending to the femoral stem. Fracture is mildly displaced. Nondisplaced component involves the anterior as well as lateral subtrochanteric femur. 2. Underlying lobulated lucency surrounding the femoral stem, as well as acetabular component of left hip arthroplasty appears similar to prior CT were visualized,  and suggestive of underlying particle disease. 3. Right hip arthroplasty without evidence of periprosthetic fracture or lucency. Aortic Atherosclerosis (ICD10-I70.0). Electronically Signed   By: Keith Rake M.D.   On: 04/08/2021 20:09   DG Hips Bilat W or Wo Pelvis 3-4 Views  Result Date: 04/08/2021 CLINICAL DATA:  Fall with pain EXAM: DG HIP (WITH OR WITHOUT PELVIS) 3-4V BILAT COMPARISON:  Hip radiographs dated 09/24/2018. FINDINGS: The patient is status post bilateral total hip arthroplasties. No evidence of hip dislocation on either side. There is significant lucency around the acetabular component on the left which may reflect loosening or particle disease. There is significant demineralization of the proximal left femur and suggestion of a fracture of the greater trochanter (the patient's left hand is overlying the area of interest on the pelvis AP view). There is no evidence of loosening of either femoral shaft component of the hip arthroplasties. No pelvic fracture is identified. IMPRESSION: 1. Significant demineralization of the proximal left femur and suggestion of a fracture of the greater trochanter (the patient's left hand is overlying the area of interest on the pelvis AP view). If there is continued clinical concern of a left hip fracture, repeat left hip radiographs could be considered. 2. Significant lucency around the acetabular component on the left which may reflect loosening or particle disease. Electronically Signed   By: Zerita Boers M.D.   On:  04/08/2021 11:44    Assessment/Plan   1. Periprosthetic hip fracture  - Presents with hip pain after trip and fall at ALF and is found to have periprosthetic fracture involving left hip  - Orthopedic surgery recommending non-operative management, will continue pain-control and consult PT    2. Dementia, anxiety  - Continue Aricept, Lexapro, and Ativan - Delirium precautions, sitter if needed   3. CKD IIIb  - SCr 1.44 on admission which appears to be baseline   - Renally-dose medications, monitor    4. Chronic systolic CHF  - Appears compensated  - EF was 30% in 2016  - Continue diuretic, beta-blocker, Entresto    5. Paroxysmal atrial fibrillation  - CHADS-VASc 6 (CAD, CVA x 2, CHF, age x2)  - Continue metoprolol, continue Eliquis for now but may need to reconsider risk/benefit after fall    DVT prophylaxis: Eliquis  Code Status: Full, patient's son believes pt would want an initial attempt at heroic measures but no prolonged or repeated attempts at resuscitation   Level of Care: Level of care: Med-Surg Family Communication: Kiani Standing (son) updated by phone   Disposition Plan:  Patient is from: ALF  Anticipated d/c is to: TBD Anticipated d/c date is: 04/11/21 Patient currently: pending orthopedic surgery consultation, PT evaluation, pain-control  Consults called: orthopedic surgery  Admission status: Inpatient    Vianne Bulls, MD [facility]  04/08/2021, 9:55 PM

## 2021-04-08 NOTE — ED Provider Notes (Signed)
[facility] EMERGENCY DEPARTMENT Provider Note   CSN: [id number] Arrival date & time: 04/08/21  1009  LEVEL 5 CAVEAT - DEMENTIA  History Chief Complaint  Patient presents with  . Fall    [name] is a 87 y.o. male.  HPI 87 year old male presents after a trip and fall.  Staff at the facility saw him trip and fall on the carpet.  Did not appear like he hit his head.  He is complaining of right elbow pain and transiently was complaining of right hip pain but now is complaining of left hip pain.  Further history is limited as EMS is no longer present.  The patient cannot contribute to the history due to his dementia and he does not remember the fall.   Past Medical History:  Diagnosis Date  . Anaphylactic reaction 06/02/2016  . APPENDECTOMY, HX OF 02/25/2008   Qualifier: Diagnosis of  By: [place], [place]    . Bilateral carpal tunnel syndrome 08/07/2016  . Bilateral hearing loss due to cerumen impaction 04/02/2018  . Bilateral impacted cerumen 10/27/2018  . Bilateral leg weakness 01/25/2019  . BPH (benign prostatic hyperplasia)   . BPH with obstruction/lower urinary tract symptoms 02/25/2008   Qualifier: Diagnosis of  By: [place], [place]    . CARPAL TUNNEL RELEASE, RIGHT, HX OF 02/25/2008   Qualifier: Diagnosis of  By: [place], [place]    . Chronic sinus bradycardia   . Chronic systolic (congestive) heart failure ([place])   . CKD (chronic kidney disease) stage 3, GFR 30-59 ml/min (HCC) 06/02/2016  . CKD (chronic kidney disease), stage III ([place])   . Coronary artery disease   . Coronary artery disease involving native coronary artery of native heart with angina pectoris ([place]) 02/25/2008   Qualifier: Diagnosis of  By: [place], [place]    . Cough 11/03/2008   Qualifier: Diagnosis of  By: Linda Hedges MD, [place] DISEASE, CERVICAL SPINE 02/25/2008   Qualifier: Diagnosis of  By: [place], [place]    . Diverticulosis   . DIVERTICULOSIS,  COLON 02/25/2008   Qualifier: Diagnosis of  By: [place], [place]    . GERD 02/25/2008   Qualifier: Diagnosis of  By: Danny Lawless CMA, [place]    . GERD (gastroesophageal reflux disease)   . Gross hematuria 01/28/2017  . Hip dislocation 01/31/2012  . HLD (hyperlipidemia) 02/25/2008   Qualifier: Diagnosis of  By: Danny Lawless CMA, [place]    . Hypercholesterolemia   . HYPERGLYCEMIA 02/25/2008   Qualifier: History of  By: Danny Lawless CMA, [place]    . Hypertension   . Hypertensive heart disease with heart failure ([place]) 02/25/2008   Qualifier: Diagnosis of  By: [place], [place]    . IBS (irritable bowel syndrome)   . Ischemic cardiomyopathy 09/21/2018  . Kidney stone   . Left hip pain    Chronic left hip discomfort  . Lipoma of back 08/06/2016  . Mechanical instability of hip prosthesis ([place]) 09/06/2015  . MYOCARDIAL INFARCTION 02/25/2008   Qualifier: History of  By: [place], [place]    . Myocardial infarction [facility]) 2001   Previous anterolateral myocardial infarction with presistent ST-T wave changes  . NEPHROLITHIASIS, HX OF 02/25/2008   Qualifier: Diagnosis of  By: [place], [place]    . Osteoarthritis   . Paroxysmal atrial fibrillation ([place]) 09/21/2018  . PERCUTANEOUS TRANSLUMINAL CORONARY ANGIOPLASTY, HX OF 02/25/2008   Qualifier: Diagnosis of  By: [place], [place]    . Primary  osteoarthritis of both first carpometacarpal joints 08/07/2016  . Primary osteoarthritis of both hands 08/07/2016  . Renal calculus 06/30/2013  . Sinus bradycardia 11/14/2014  . Sleep apnea    uses cpap  . SVT (supraventricular tachycardia) ([place])   . TRIGGER FINGER 02/25/2008   Annotation: right index finger Qualifier: History of  By: Danny Lawless CMA, [place]    . Tubular adenoma   . TUBULOVILLOUS ADENOMA, COLON 02/25/2008   Qualifier: History of  By: [place], [place]    . Unspecified personal history presenting hazards to health 02/25/2008   Centricity Description: POLYPECTOMY, HX OF Qualifier: Diagnosis of  By: Danny Lawless  CMA, [place]   Centricity Description: PROSTATECTOMY, TRANSURETHRAL, HX OF Qualifier: Diagnosis of  By: Danny Lawless CMA, [place]      Patient Active Problem List   Diagnosis Date Noted  . Tubular adenoma   . Sleep apnea   . Left hip pain   . Kidney stone   . IBS (irritable bowel syndrome)   . Hypertension   . Hypercholesterolemia   . GERD (gastroesophageal reflux disease)   . Diverticulosis   . Coronary artery disease   . CKD (chronic kidney disease), stage III ([place])   . BPH (benign prostatic hyperplasia)   . Bilateral leg weakness 01/25/2019  . Bilateral impacted cerumen 10/27/2018  . Presbycusis of both ears 10/27/2018  . Paroxysmal atrial fibrillation ([place]) 09/21/2018  . Ischemic cardiomyopathy 09/21/2018  . Bilateral hearing loss due to cerumen impaction 04/02/2018  . Gross hematuria 01/28/2017  . Bilateral carpal tunnel syndrome 08/07/2016  . Primary osteoarthritis of both first carpometacarpal joints 08/07/2016  . Primary osteoarthritis of both hands 08/07/2016  . Lipoma of back 08/06/2016  . CKD (chronic kidney disease) stage 3, GFR 30-59 ml/min (HCC) 06/02/2016  . Anaphylactic reaction 06/02/2016  . Chronic systolic (congestive) heart failure ([place])   . Chronic sinus bradycardia   . Mechanical instability of hip prosthesis ([place]) 09/06/2015  . Sinus bradycardia 11/14/2014  . Renal calculus 06/30/2013  . Hip dislocation 01/31/2012  . Osteoarthritis 08/12/2011  . COUGH 11/03/2008  . TUBULOVILLOUS ADENOMA, COLON 02/25/2008  . HLD (hyperlipidemia) 02/25/2008  . Hypertensive heart disease with heart failure ([place]) 02/25/2008  . MYOCARDIAL INFARCTION 02/25/2008  . Coronary artery disease involving native coronary artery of native heart with angina pectoris ([place]) 02/25/2008  . GERD 02/25/2008  . DIVERTICULOSIS, COLON 02/25/2008  . DEGENERATIVE JOINT DISEASE, CERVICAL SPINE 02/25/2008  . TRIGGER FINGER 02/25/2008  . HYPERGLYCEMIA 02/25/2008  . CARPAL TUNNEL SYNDROME, HX OF  02/25/2008  . SVT (supraventricular tachycardia) ([place]) 02/25/2008  . BPH with obstruction/lower urinary tract symptoms 02/25/2008  . Unspecified personal history presenting hazards to health 02/25/2008  . APPENDECTOMY, HX OF 02/25/2008  . PERCUTANEOUS TRANSLUMINAL CORONARY ANGIOPLASTY, HX OF 02/25/2008  . CARPAL TUNNEL RELEASE, RIGHT, HX OF 02/25/2008  . NEPHROLITHIASIS, HX OF 02/25/2008  . Myocardial infarction [facility]) 2001    Past Surgical History:  Procedure Laterality Date  . ANGIOPLASTY  2002   with stent and angioplasty in 2003  . APPENDECTOMY    . BACK SURGERY    . CARDIAC CATHETERIZATION  05/07/2002   Ejection fraction 20%  . CARPAL TUNNEL RELEASE Left 08/29/2016   Procedure: LEFT CARPAL TUNNEL RELEASE;  Surgeon: [name], MD;  Location: [place];  Service: Orthopedics;  Laterality: Left;  REG/FAB  . FINGER SURGERY    . HIP CLOSED REDUCTION  01/31/2012   Procedure: CLOSED MANIPULATION HIP;  Surgeon: [name], MD;  Location: [facility];  Service: Orthopedics;  Laterality: Right;  closed reduction right dislocated hip  . HIP CLOSED REDUCTION  10/10/2012   Procedure: CLOSED MANIPULATION HIP;  Surgeon: [name], MD;  Location: [facility];  Service: Orthopedics;  Laterality: Right;  . HIP CLOSED REDUCTION Right 03/17/2015   Procedure: CLOSED REDUCTION HIP;  Surgeon: Rod Can, MD;  Location: [facility];  Service: Orthopedics;  Laterality: Right;  . JOINT REPLACEMENT     BILATERAL HIP REPLACEMENTS  . TONSILLECTOMY    . TOTAL HIP REVISION Right 09/06/2015   Procedure: RIGHT TOTAL HIP ACETABULAR LINER REVISION (CONSTRAINED LINER);  Surgeon: [name], MD;  Location: [facility];  Service: Orthopedics;  Laterality: Right;  . TRANSURETHRAL RESECTION OF PROSTATE         Family History  Problem Relation Age of Onset  . Heart attack Father   . Stroke Father   . Stroke Mother   . Alzheimer's disease Mother   . Cancer Sister     Social History   Tobacco Use   . Smoking status: Never Smoker  . Smokeless tobacco: Never Used  Vaping Use  . Vaping Use: Never used  Substance Use Topics  . Alcohol use: No  . Drug use: No    Home Medications Prior to Admission medications   Medication Sig Start Date End Date Taking? Authorizing Provider  acetaminophen (TYLENOL) 500 MG tablet Take 250 mg by mouth every 4 (four) hours as needed for mild pain.   Yes [provider]  Dextromethorphan HBr 10 MG/5ML SYRP Take 5 mLs by mouth every 4 (four) hours as needed (cough/elevated temp).   Yes [provider]  donepezil (ARICEPT) 10 MG tablet Take 10 mg by mouth at bedtime.   Yes [provider]  ELIQUIS 2.5 MG TABS tablet TAKE 1 TABLET BY MOUTH TWICE A DAY Patient taking differently: Take 5 mg by mouth 2 (two) times daily. 08/15/20  Yes Richardo Priest, MD  escitalopram (LEXAPRO) 10 MG tablet Take 10 mg by mouth daily. 04/01/21  Yes [provider]  ferrous sulfate 325 (65 FE) MG tablet Take 325 mg by mouth 2 (two) times daily with a meal.    Yes [provider]  loperamide (IMODIUM A-D) 2 MG tablet Take 2 mg by mouth every 4 (four) hours as needed for diarrhea or loose stools.   Yes [provider]  LORazepam (ATIVAN) 0.5 MG tablet Take 0.5 mg by mouth 2 (two) times daily. 03/06/21  Yes [provider]  metoprolol succinate (TOPROL-XL) 25 MG 24 hr tablet Take 25 mg by mouth daily. 03/09/21  Yes [provider]  oxyCODONE (ROXICODONE) 5 MG immediate release tablet Take 1 tablet (5 mg total) by mouth every 6 (six) hours as needed for severe pain. 04/08/21  Yes Sherwood Gambler, MD  sacubitril-valsartan (ENTRESTO) 49-51 MG Take 1 tablet by mouth 2 (two) times daily. Patient taking differently: Take 1 tablet by mouth daily. 06/14/20  Yes Richardo Priest, MD  tamsulosin (FLOMAX) 0.4 MG CAPS capsule Take 0.4 mg by mouth daily. 10/11/19  Yes [provider]  torsemide (DEMADEX) 20 MG tablet Take 1  tablet (20 mg total) by mouth daily. 04/13/20  Yes Park Liter, MD    Allergies    Beef-derived products, Acetaminophen, Alpha-gal, Amiodarone, Prilosec [omeprazole], and Prednisone  Review of Systems   Review of Systems  Unable to perform ROS: Dementia    Physical Exam Updated Vital Signs BP 134/78   Pulse 80   Temp  97.6 F (36.4 C) (Oral)   Resp 15   SpO2 92%   Physical Exam Vitals and nursing note reviewed.  Constitutional:      General: He is not in acute distress.    Appearance: He is well-developed. He is not ill-appearing or diaphoretic.     Comments: asleep  HENT:     Head: Normocephalic and atraumatic.     Right Ear: External ear normal.     Left Ear: External ear normal.     Nose: Nose normal.  Eyes:     General:        Right eye: No discharge.        Left eye: No discharge.  Cardiovascular:     Rate and Rhythm: Normal rate and regular rhythm.     Pulses:          Radial pulses are 2+ on the right side.     Heart sounds: Normal heart sounds.  Pulmonary:     Effort: Pulmonary effort is normal.     Breath sounds: Normal breath sounds.  Abdominal:     Palpations: Abdomen is soft.     Tenderness: There is no abdominal tenderness.  Musculoskeletal:     Right elbow: Laceration (superficial skin tear to outer elbow) present. Normal range of motion. No tenderness.     Cervical back: Neck supple.     Right hip: No deformity or tenderness. Normal range of motion.     Left hip: Tenderness (lateral) present. Decreased range of motion.     Left upper leg: No tenderness.     Left knee: No swelling. No tenderness.  Skin:    General: Skin is warm and dry.  Neurological:     Mental Status: He is alert.  Psychiatric:        Mood and Affect: Mood is not anxious.     ED Results / Procedures / Treatments   Labs (all labs ordered are listed, but only abnormal results are displayed) Labs Reviewed - No data to display  EKG None  Radiology DG Elbow  Complete Right  Result Date: 04/08/2021 CLINICAL DATA:  Fall with elbow pain. EXAM: RIGHT ELBOW - COMPLETE 3+ VIEW COMPARISON:  None. FINDINGS: There is no evidence of fracture, dislocation, or joint effusion. There is no evidence of arthropathy or other focal bone abnormality. Soft tissues are unremarkable. IMPRESSION: Negative. Electronically Signed   By: Zerita Boers M.D.   On: 04/08/2021 11:35   CT Head Wo Contrast  Result Date: 04/08/2021 CLINICAL DATA:  Head trauma.  Status post fall. EXAM: CT HEAD WITHOUT CONTRAST TECHNIQUE: Contiguous axial images were obtained from the base of the skull through the vertex without intravenous contrast. COMPARISON:  None. FINDINGS: Brain: No evidence of acute infarction, hemorrhage, hydrocephalus, extra-axial collection or mass lesion/mass effect. Remote right cerebellar hemisphere infarct. There is mild diffuse low-attenuation within the subcortical and periventricular white matter compatible with chronic microvascular disease. Vascular: No hyperdense vessel or unexpected calcification. Skull: Normal. Negative for fracture or focal lesion. Sinuses/Orbits: No acute finding. Other: None IMPRESSION: 1. No acute intracranial abnormalities. 2. Chronic small vessel ischemic change and brain atrophy. 3. Remote right cerebellar hemisphere infarct. Electronically Signed   By: Kerby Moors M.D.   On: 04/08/2021 11:08   DG Hips Bilat W or Wo Pelvis 3-4 Views  Result Date: 04/08/2021 CLINICAL DATA:  Fall with pain EXAM: DG HIP (WITH OR WITHOUT PELVIS) 3-4V BILAT COMPARISON:  Hip radiographs dated 09/24/2018. FINDINGS: The patient is  status post bilateral total hip arthroplasties. No evidence of hip dislocation on either side. There is significant lucency around the acetabular component on the left which may reflect loosening or particle disease. There is significant demineralization of the proximal left femur and suggestion of a fracture of the greater trochanter (the  patient's left hand is overlying the area of interest on the pelvis AP view). There is no evidence of loosening of either femoral shaft component of the hip arthroplasties. No pelvic fracture is identified. IMPRESSION: 1. Significant demineralization of the proximal left femur and suggestion of a fracture of the greater trochanter (the patient's left hand is overlying the area of interest on the pelvis AP view). If there is continued clinical concern of a left hip fracture, repeat left hip radiographs could be considered. 2. Significant lucency around the acetabular component on the left which may reflect loosening or particle disease. Electronically Signed   By: Zerita Boers M.D.   On: 04/08/2021 11:44    Procedures Procedures   Medications Ordered in ED Medications  oxyCODONE (Oxy IR/ROXICODONE) immediate release tablet 5 mg (5 mg Oral Given 04/08/21 1133)  Tdap (BOOSTRIX) injection 0.5 mL (0.5 mLs Intramuscular Given 04/08/21 1133)    ED Course  I have reviewed the triage vital signs and the nursing notes.  Pertinent labs & imaging results that were available during my care of the patient were reviewed by me and considered in my medical decision making (see chart for details).    MDM Rules/Calculators/A&P                          I discussed with Dr. Alvan Dame, nothing to do emergently about this possible loosening.  If he is able to bear weight he could be discharged.  He is able to bear weight with some oxycodone and walk with a walker.  I had multiple back-and-forth discussions with the patient's power of attorney and son as the patient lives by himself because his wife is in the hospital though he is in assisted living.  He has talked to [facility] and initially wanted the patient admitted for observation but now would like patient to go home as he is worried he will get delirious in the hospital.  Patient seems to have good pain control.  Will discharge with oxycodone. Given return  precautions. Final Clinical Impression(s) / ED Diagnoses Final diagnoses:  Closed nondisplaced fracture of greater trochanter of left femur, initial encounter ([place])    Rx / DC Orders ED Discharge Orders         Ordered    oxyCODONE (ROXICODONE) 5 MG immediate release tablet  Every 6 hours PRN        04/08/21 1453           Sherwood Gambler, MD 04/08/21 1547

## 2021-04-08 NOTE — ED Notes (Signed)
Left abrasion dressed.

## 2021-04-08 NOTE — ED Notes (Signed)
Attempted report x1. 

## 2021-04-09 LAB — BASIC METABOLIC PANEL
Anion gap: 6 (ref 5–15)
BUN: 21 mg/dL (ref 8–23)
CO2: 26 mmol/L (ref 22–32)
Calcium: 9.2 mg/dL (ref 8.9–10.3)
Chloride: 105 mmol/L (ref 98–111)
Creatinine, Ser: 1.41 mg/dL — ABNORMAL HIGH (ref 0.61–1.24)
GFR, Estimated: 46 mL/min — ABNORMAL LOW (ref >60)
Glucose, Bld: 127 mg/dL — ABNORMAL HIGH (ref 70–99)
Potassium: 4.8 mmol/L (ref 3.5–5.1)
Sodium: 137 mmol/L (ref 135–145)

## 2021-04-09 LAB — CBC
HCT: 39.3 % (ref 39.0–52.0)
Hemoglobin: 12.5 g/dL — ABNORMAL LOW (ref 13.0–17.0)
MCH: 30.7 pg (ref 26.0–34.0)
MCHC: 31.8 g/dL (ref 30.0–36.0)
MCV: 96.6 fL (ref 80.0–100.0)
Platelets: 162 10*3/uL (ref 150–400)
RBC: 4.07 MIL/uL — ABNORMAL LOW (ref 4.22–5.81)
RDW: 14.5 % (ref 11.5–15.5)
WBC: 8.8 10*3/uL (ref 4.0–10.5)
nRBC: 0 % (ref 0.0–0.2)

## 2021-04-09 NOTE — Progress Notes (Addendum)
Initial Nutrition Assessment  DOCUMENTATION CODES:   Not applicable  INTERVENTION:   Mighty Shake BID (Breakfast and Dinner)  Encourage PO intake    NUTRITION DIAGNOSIS:   Increased nutrient needs related to hip fracture as evidenced by estimated needs.  GOAL:   Patient will meet greater than or equal to 90% of their needs  MONITOR:   PO intake,Supplement acceptance  REASON FOR ASSESSMENT:   Consult Assessment of nutrition requirement/status  ASSESSMENT:   Pt with PMH of afib, CKD, CHF, dementia, anxiety, CAD, CVA admitted after fall at ALF with bilateral hip pain. Imaging showed acute periprosthetic fracture of the left hip arthroplasty.   Ortho recommends non-operative management, plan for SNF placement.  Therapy recommends SNF.   Spoke with pt who reports that his appetite is good. He is hungry and waiting on lunch. Pt with documented alpha gal allergy.   Medications reviewed and include: senokot Labs reviewed    NUTRITION - FOCUSED PHYSICAL EXAM:  Flowsheet Row Most Recent Value  Orbital Region No depletion  Upper Arm Region Mild depletion  Thoracic and Lumbar Region No depletion  Buccal Region No depletion  Temple Region No depletion  Clavicle Bone Region No depletion  Clavicle and Acromion Bone Region Mild depletion  Scapular Bone Region No depletion  Dorsal Hand No depletion  Patellar Region No depletion  Anterior Thigh Region No depletion  Posterior Calf Region No depletion  Edema (RD Assessment) None  Hair Reviewed  Eyes Reviewed  Mouth Reviewed  Skin Reviewed  Nails Reviewed       Diet Order:   Diet Order            Diet regular Room service appropriate? Yes; Fluid consistency: Thin  Diet effective now                 EDUCATION NEEDS:   No education needs have been identified at this time  Skin:  Skin Assessment: Reviewed RN Assessment  Last BM:  4/23  Height:   Ht Readings from Last 1 Encounters:  03/27/21 5\' 7"   (1.702 m)    Weight:   Wt Readings from Last 1 Encounters:  03/27/21 75.8 kg    Ideal Body Weight:  67.2 kg  BMI:  There is no height or weight on file to calculate BMI.  Estimated Nutritional Needs:   Kcal:  1600-1800  Protein:  80-90 grams  Fluid:  > 1.6 L/day  Lockie Pares., RD, LDN, CNSC See [facility] for contact information

## 2021-04-09 NOTE — Consult Note (Signed)
Reason for Consult:left periprosthetic proximal femur fracture Referring Physician: Tawanna Solo, MD  [name] is an 87 y.o. male.  HPI: [name] is a 87 y.o. male with medical history significant for atrial fibrillation on Eliquis, chronic kidney disease stage IIIb, chronic systolic CHF, dementia, anxiety, coronary artery disease, and history of CVA, now presenting to the emergency department with hip pain after a fall.  Staff at the patient's ALF saw him tripped on some carpeting and fell to the ground without losing consciousness.  Patient was complaining of bilateral hip pain and brought into the ED.  He is unable to contribute much to the history due to his dementia.  Past Medical History:  Diagnosis Date  . Anaphylactic reaction 06/02/2016  . APPENDECTOMY, HX OF 02/25/2008   Qualifier: Diagnosis of  By: [place], [place]    . Bilateral carpal tunnel syndrome 08/07/2016  . Bilateral hearing loss due to cerumen impaction 04/02/2018  . Bilateral impacted cerumen 10/27/2018  . Bilateral leg weakness 01/25/2019  . BPH (benign prostatic hyperplasia)   . BPH with obstruction/lower urinary tract symptoms 02/25/2008   Qualifier: Diagnosis of  By: [place], [place]    . CARPAL TUNNEL RELEASE, RIGHT, HX OF 02/25/2008   Qualifier: Diagnosis of  By: [place], [place]    . Chronic sinus bradycardia   . Chronic systolic (congestive) heart failure ([place])   . CKD (chronic kidney disease) stage 3, GFR 30-59 ml/min (HCC) 06/02/2016  . CKD (chronic kidney disease), stage III ([place])   . Coronary artery disease   . Coronary artery disease involving native coronary artery of native heart with angina pectoris ([place]) 02/25/2008   Qualifier: Diagnosis of  By: [place], [place]    . Cough 11/03/2008   Qualifier: Diagnosis of  By: Linda Hedges MD, [place] DISEASE, CERVICAL SPINE 02/25/2008   Qualifier: Diagnosis of  By: [place], [place]    . Diverticulosis   . DIVERTICULOSIS,  COLON 02/25/2008   Qualifier: Diagnosis of  By: [place], [place]    . GERD 02/25/2008   Qualifier: Diagnosis of  By: Danny Lawless CMA, [place]    . GERD (gastroesophageal reflux disease)   . Gross hematuria 01/28/2017  . Hip dislocation 01/31/2012  . HLD (hyperlipidemia) 02/25/2008   Qualifier: Diagnosis of  By: Danny Lawless CMA, [place]    . Hypercholesterolemia   . HYPERGLYCEMIA 02/25/2008   Qualifier: History of  By: Danny Lawless CMA, [place]    . Hypertension   . Hypertensive heart disease with heart failure ([place]) 02/25/2008   Qualifier: Diagnosis of  By: [place], [place]    . IBS (irritable bowel syndrome)   . Ischemic cardiomyopathy 09/21/2018  . Kidney stone   . Left hip pain    Chronic left hip discomfort  . Lipoma of back 08/06/2016  . Mechanical instability of hip prosthesis ([place]) 09/06/2015  . MYOCARDIAL INFARCTION 02/25/2008   Qualifier: History of  By: [place], [place]    . Myocardial infarction [facility]) 2001   Previous anterolateral myocardial infarction with presistent ST-T wave changes  . NEPHROLITHIASIS, HX OF 02/25/2008   Qualifier: Diagnosis of  By: [place], [place]    . Osteoarthritis   . Paroxysmal atrial fibrillation ([place]) 09/21/2018  . PERCUTANEOUS TRANSLUMINAL CORONARY ANGIOPLASTY, HX OF 02/25/2008   Qualifier: Diagnosis of  By: [place], [place]    . Primary osteoarthritis of both first carpometacarpal joints 08/07/2016  . Primary osteoarthritis of both hands 08/07/2016  . Renal calculus  06/30/2013  . Sinus bradycardia 11/14/2014  . Sleep apnea    uses cpap  . SVT (supraventricular tachycardia) ([place])   . TRIGGER FINGER 02/25/2008   Annotation: right index finger Qualifier: History of  By: Danny Lawless CMA, [place]    . Tubular adenoma   . TUBULOVILLOUS ADENOMA, COLON 02/25/2008   Qualifier: History of  By: [place], [place]    . Unspecified personal history presenting hazards to health 02/25/2008   Centricity Description: POLYPECTOMY, HX OF Qualifier: Diagnosis of  By: Danny Lawless  CMA, [place]   Centricity Description: PROSTATECTOMY, TRANSURETHRAL, HX OF Qualifier: Diagnosis of  By: [place], [place]      Past Surgical History:  Procedure Laterality Date  . ANGIOPLASTY  2002   with stent and angioplasty in 2003  . APPENDECTOMY    . BACK SURGERY    . CARDIAC CATHETERIZATION  05/07/2002   Ejection fraction 20%  . CARPAL TUNNEL RELEASE Left 08/29/2016   Procedure: LEFT CARPAL TUNNEL RELEASE;  Surgeon: [name], MD;  Location: [facility];  Service: Orthopedics;  Laterality: Left;  REG/FAB  . FINGER SURGERY    . HIP CLOSED REDUCTION  01/31/2012   Procedure: CLOSED MANIPULATION HIP;  Surgeon: [name], MD;  Location: [facility];  Service: Orthopedics;  Laterality: Right;  closed reduction right dislocated hip  . HIP CLOSED REDUCTION  10/10/2012   Procedure: CLOSED MANIPULATION HIP;  Surgeon: [name], MD;  Location: [facility];  Service: Orthopedics;  Laterality: Right;  . HIP CLOSED REDUCTION Right 03/17/2015   Procedure: CLOSED REDUCTION HIP;  Surgeon: Rod Can, MD;  Location: [facility];  Service: Orthopedics;  Laterality: Right;  . JOINT REPLACEMENT     BILATERAL HIP REPLACEMENTS  . TONSILLECTOMY    . TOTAL HIP REVISION Right 09/06/2015   Procedure: RIGHT TOTAL HIP ACETABULAR LINER REVISION (CONSTRAINED LINER);  Surgeon: [name], MD;  Location[facility];  Service: Orthopedics;  Laterality: Right;  . TRANSURETHRAL RESECTION OF PROSTATE      Family History  Problem Relation Age of Onset  . Heart attack Father   . Stroke Father   . Stroke Mother   . Alzheimer's disease Mother   . Cancer Sister     Social History:  reports that he has never smoked. He has never used smokeless tobacco. He reports that he does not drink alcohol and does not use drugs.  Allergies:  Allergies  Allergen Reactions  . Beef-Derived Products Hives    Severe hives, nausea, throat swelling  . Acetaminophen Other (See Comments)    urticaria urticaria  .  Alpha-Gal     Per mar  . Amiodarone Other (See Comments)    Unknown  Affected my breathing  . Prilosec [Omeprazole]     Diarrhea, stomach cramps  . Prednisone Palpitations    REACTION: causes tachycardia, increase BP    Medications:  I have reviewed the patient's current medications. Prior to Admission:  Medications Prior to Admission  Medication Sig Dispense Refill Last Dose  . acetaminophen (TYLENOL) 500 MG tablet Take 250 mg by mouth every 4 (four) hours as needed for mild pain.   03/20/2021  . Dextromethorphan HBr 10 MG/5ML SYRP Take 5 mLs by mouth every 4 (four) hours as needed (cough/elevated temp).   unknown  . donepezil (ARICEPT) 10 MG tablet Take 10 mg by mouth at bedtime.   04/07/2021 at 2000  . ELIQUIS 2.5 MG TABS tablet TAKE 1 TABLET BY MOUTH TWICE A DAY (Patient taking differently:  Take 5 mg by mouth 2 (two) times daily.) 180 tablet 1 04/08/2021 at 0800  . escitalopram (LEXAPRO) 10 MG tablet Take 10 mg by mouth daily.   04/08/2021 at 0800  . ferrous sulfate 325 (65 FE) MG tablet Take 325 mg by mouth 2 (two) times daily with a meal.    04/08/2021 at 0800  . loperamide (IMODIUM A-D) 2 MG tablet Take 2 mg by mouth every 4 (four) hours as needed for diarrhea or loose stools.   unknown  . LORazepam (ATIVAN) 0.5 MG tablet Take 0.5 mg by mouth 2 (two) times daily.   04/08/2021 at 0800  . metoprolol succinate (TOPROL-XL) 25 MG 24 hr tablet Take 25 mg by mouth daily.   04/07/2021 at 1100  . sacubitril-valsartan (ENTRESTO) 49-51 MG Take 1 tablet by mouth 2 (two) times daily. (Patient taking differently: Take 1 tablet by mouth daily.) 180 tablet 0 04/07/2021 at 1130  . tamsulosin (FLOMAX) 0.4 MG CAPS capsule Take 0.4 mg by mouth daily.   04/07/2021 at 1400  . torsemide (DEMADEX) 20 MG tablet Take 1 tablet (20 mg total) by mouth daily. 90 tablet 1 04/07/2021 at 1130    Results for orders placed or performed during the hospital encounter of 04/08/21 (from the past 24 hour(s))  CBC with  Differential/Platelet     Status: Abnormal   Collection Time: 04/08/21  6:31 PM  Result Value Ref Range   WBC 8.5 4.0 - 10.5 K/uL   RBC 3.94 (L) 4.22 - 5.81 MIL/uL   Hemoglobin 12.1 (L) 13.0 - 17.0 g/dL   HCT 38.5 (L) 39.0 - 52.0 %   MCV 97.7 80.0 - 100.0 fL   MCH 30.7 26.0 - 34.0 pg   MCHC 31.4 30.0 - 36.0 g/dL   RDW 14.6 11.5 - 15.5 %   Platelets 174 150 - 400 K/uL   nRBC 0.0 0.0 - 0.2 %   Neutrophils Relative % 70 %   Neutro Abs 6.0 1.7 - 7.7 K/uL   Lymphocytes Relative 12 %   Lymphs Abs 1.0 0.7 - 4.0 K/uL   Monocytes Relative 13 %   Monocytes Absolute 1.1 (H) 0.1 - 1.0 K/uL   Eosinophils Relative 3 %   Eosinophils Absolute 0.2 0.0 - 0.5 K/uL   Basophils Relative 1 %   Basophils Absolute 0.1 0.0 - 0.1 K/uL   Immature Granulocytes 1 %   Abs Immature Granulocytes 0.10 (H) 0.00 - 0.07 K/uL  Basic metabolic panel     Status: Abnormal   Collection Time: 04/08/21  6:31 PM  Result Value Ref Range   Sodium 139 135 - 145 mmol/L   Potassium 4.4 3.5 - 5.1 mmol/L   Chloride 106 98 - 111 mmol/L   CO2 26 22 - 32 mmol/L   Glucose, Bld 127 (H) 70 - 99 mg/dL   BUN 24 (H) 8 - 23 mg/dL   Creatinine, Ser 1.44 (H) 0.61 - 1.24 mg/dL   Calcium 9.2 8.9 - 10.3 mg/dL   GFR, Estimated 45 (L) >60 mL/min   Anion gap 7 5 - 15  Resp Panel by RT-PCR (Flu A&B, Covid) Nasopharyngeal Swab     Status: None   Collection Time: 04/08/21  6:51 PM   Specimen: Nasopharyngeal Swab; Nasopharyngeal(NP) swabs in vial transport medium  Result Value Ref Range   SARS Coronavirus 2 by RT PCR NEGATIVE NEGATIVE   Influenza A by PCR NEGATIVE NEGATIVE   Influenza B by PCR NEGATIVE NEGATIVE  CBC  Status: Abnormal   Collection Time: 04/09/21  6:02 AM  Result Value Ref Range   WBC 8.8 4.0 - 10.5 K/uL   RBC 4.07 (L) 4.22 - 5.81 MIL/uL   Hemoglobin 12.5 (L) 13.0 - 17.0 g/dL   HCT 39.3 39.0 - 52.0 %   MCV 96.6 80.0 - 100.0 fL   MCH 30.7 26.0 - 34.0 pg   MCHC 31.8 30.0 - 36.0 g/dL   RDW 14.5 11.5 - 15.5 %    Platelets 162 150 - 400 K/uL   nRBC 0.0 0.0 - 0.2 %  Basic metabolic panel     Status: Abnormal   Collection Time: 04/09/21  6:02 AM  Result Value Ref Range   Sodium 137 135 - 145 mmol/L   Potassium 4.8 3.5 - 5.1 mmol/L   Chloride 105 98 - 111 mmol/L   CO2 26 22 - 32 mmol/L   Glucose, Bld 127 (H) 70 - 99 mg/dL   BUN 21 8 - 23 mg/dL   Creatinine, Ser 1.41 (H) 0.61 - 1.24 mg/dL   Calcium 9.2 8.9 - 10.3 mg/dL   GFR, Estimated 46 (L) >60 mL/min   Anion gap 6 5 - 15     X-ray: EXAM: DG HIP (WITH OR WITHOUT PELVIS) 3-4V BILAT  COMPARISON:  Hip radiographs dated 09/24/2018.  FINDINGS: The patient is status post bilateral total hip arthroplasties. No evidence of hip dislocation on either side. There is significant lucency around the acetabular component on the left which may reflect loosening or particle disease. There is significant demineralization of the proximal left femur and suggestion of a fracture of the greater trochanter (the patient's left hand is overlying the area of interest on the pelvis AP view). There is no evidence of loosening of either femoral shaft component of the hip arthroplasties. No pelvic fracture is identified.  IMPRESSION: 1. Significant demineralization of the proximal left femur and suggestion of a fracture of the greater trochanter (the patient's left hand is overlying the area of interest on the pelvis AP view). If there is continued clinical concern of a left hip fracture, repeat left hip radiographs could be considered. 2. Significant lucency around the acetabular component on the left which may reflect loosening or particle disease.   Electronically Signed   By: Zerita Boers M.D.  CLINICAL DATA:  Left hip pain after injury. Fall. Possible loosening of hardware on radiograph.  EXAM: CT PELVIS WITHOUT CONTRAST  TECHNIQUE: Multidetector CT imaging of the pelvis was performed following the standard protocol without intravenous  contrast.  COMPARISON:  Pelvis and radiograph earlier today. Abdominopelvic CT 06/28/2019  FINDINGS: Urinary Tract: Decompressed distal ureters. Partially distended urinary bladder, obscured by streak artifact from bilateral hip arthroplasties.  Bowel: Sigmoid diverticulosis without diverticulitis. No acute findings.  Vascular/Lymphatic: Aortic and branch atherosclerosis. No pelvic adenopathy.  Reproductive: Prostate appears prominent, but is obscured by streak artifact.  Other:  Fat in both inguinal canals.  No pelvic free fluid.  Musculoskeletal: Left hip arthroplasty. There is an acute periprosthetic fracture involving the lateral aspect of the greater trochanter, extending to the femoral stem. Fracture is mildly displaced. Nondisplaced fracture involvement of the anterior as well as lateral subtrochanteric femur. Underlying lobulated lucency surrounding the femoral stem, as well as acetabular component of left hip arthroplasty appears similar to prior CT were visualized, and consistent with underlying particle disease. No additional pelvic fracture. Pubic rami and bony pelvis are intact. Right hip arthroplasty without evidence of periprosthetic fracture or lucency.  IMPRESSION:  1. Acute periprosthetic fracture about left hip arthroplasty involving the lateral aspect of the greater trochanter, extending to the femoral stem. Fracture is mildly displaced. Nondisplaced component involves the anterior as well as lateral subtrochanteric femur. 2. Underlying lobulated lucency surrounding the femoral stem, as well as acetabular component of left hip arthroplasty appears similar to prior CT were visualized, and suggestive of underlying particle disease. 3. Right hip arthroplasty without evidence of periprosthetic fracture or lucency.  Aortic Atherosclerosis (ICD10-I70.0).   Electronically Signed   By: Keith Rake M.D.  ROS: As per admitting H&P  Blood  pressure 136/79, pulse 75, temperature (!) 97.5 F (36.4 C), temperature source Oral, resp. rate 18, SpO2 98 %.  Physical Exam: Eyes: PERTLA, lids and conjunctivae normal ENMT: Mucous membranes are moist. Posterior pharynx clear of any exudate or lesions.   Neck: supple, no masses  Respiratory:  no wheezing, no crackles. No accessory muscle use.  Cardiovascular: Rate ~74, holosystolic murmur at upper sternal border. Trace pitting edema bilateral LEs. Abdomen: No distension, no tenderness, soft. Bowel sounds active.  Musculoskeletal: Left hip tender, neurovascularly intact distally. No joint deformity upper and lower extremities.   Pain with motion and attempted weight bearing Skin: no significant rashes, lesions, ulcers. Warm, dry, well-perfused. Neurologic: No facial asymmetry, no dysarthria, gross hearing deficit. Sensation intact. Moving all extremities.  Psychiatric: Alert and oriented to person only. Pleasant and cooperative.    Assessment/Plan: 1. History of bilateral THR 2. Fall resulting in minimally displaced left periprosthetic proximal femur fracture with stable femoral prosthesis 3. Significant periprosthetic particulate disease (osteolysis)  Plan: Non operative management of left periprosthetic femur fracture TDWB with transfers from bed to chair for 6-8 weeks Follow up in our office in 2 weeks  Mauri Pole 04/09/2021, 7:06 AM

## 2021-04-09 NOTE — Plan of Care (Signed)
  Problem: Education: Goal: Knowledge of General Education information will improve Description Including pain rating scale, medication(s)/side effects and non-pharmacologic comfort measures Outcome: Progressing   Problem: Health Behavior/Discharge Planning: Goal: Ability to manage health-related needs will improve Outcome: Progressing   

## 2021-04-09 NOTE — TOC Initial Note (Signed)
Transition of Care [facility]) - Initial/Assessment Note    Patient Details  Name: [name] MRN: [id number] Date of Birth: 10/10/1925  Transition of Care [facility]) CM/SW Contact:    Coralee Pesa, [place] Phone Number: 04/09/2021, 3:30 PM  Clinical Narrative:                 CSW spoke with pt's son Randall Hiss, pt's wife is also on the unit and going to [employer] [facility]. Randall Hiss requested his father go there too. [facility] will accept this pt too when medically ready. CSW will complete workup and fax info to [facility]. SW will continue to follow for DC planning.  Expected Discharge Plan: Skilled Nursing Facility Barriers to Discharge: Continued Medical Work up   Patient Goals and CMS Choice Patient states their goals for this hospitalization and ongoing recovery are:: Pt is unable to participate in goal setting due to disorientation. CMS Medicare.gov Compare Post Acute Care list provided to:: Patient Represenative (must comment) (Son, Randall Hiss) Choice offered to / list presented to : Adult Children  Expected Discharge Plan and Services Expected Discharge Plan: [place] Acute Care Choice: [place] Living arrangements for the past 2 months: [place]                                      Prior Living Arrangements/Services Living arrangements for the past 2 months: [place] Lives with:: Facility Resident,Spouse Patient language and need for interpreter reviewed:: Yes Do you feel safe going back to the place where you live?: Yes      Need for Family Participation in Patient Care: Yes (Comment) Care giver support system in place?: Yes (comment)   Criminal Activity/Legal Involvement Pertinent to Current Situation/Hospitalization: No - Comment as needed  Activities of Daily Living Home Assistive Devices/Equipment: Hearing aid,Walker (specify type) ADL Screening (condition at time of admission) Patient's cognitive ability  adequate to safely complete daily activities?: Yes Is the patient deaf or have difficulty hearing?: Yes Does the patient have difficulty seeing, even when wearing glasses/contacts?: No Does the patient have difficulty concentrating, remembering, or making decisions?: Yes Patient able to express need for assistance with ADLs?: Yes Does the patient have difficulty dressing or bathing?: No Independently performs ADLs?: No Communication: Independent Dressing (OT): Needs assistance Is this a change from baseline?: Pre-admission baseline Grooming: Needs assistance Feeding: Independent Bathing: Needs assistance Is this a change from baseline?: Pre-admission baseline Toileting: Needs assistance In/Out Bed: Needs assistance Walks in Home: Needs assistance Weakness of Legs: None Weakness of Arms/Hands: None  Permission Sought/Granted Permission sought to share information with : Facility Contact Representative,Family Supports    Share Information with NAME: Elhadj Girton  Permission granted to share info w AGENCY: [place] granted to share info w Relationship: Son  Permission granted to share info w Contact Information: [phone]  Emotional Assessment Appearance:: Appears stated age Attitude/Demeanor/Rapport: Unable to Assess Affect (typically observed): Unable to Assess Orientation: : Oriented to Self,Oriented to Place Alcohol / Substance Use: Not Applicable Psych Involvement: No (comment)  Admission diagnosis:  Periprosthetic fracture of hip [F64.8XXA, Z96.649] Closed nondisplaced fracture of greater trochanter of left femur, initial encounter [facility]) [S72.115A] Patient Active Problem List   Diagnosis Date Noted  . Periprosthetic fracture of hip 04/08/2021  . Dementia ([place]) 04/08/2021  . Coagulopathy ([place])   . Anxiety disorder   .  Tubular adenoma   . Sleep apnea   . Left hip pain   . Kidney stone   . IBS (irritable bowel syndrome)   . Hypertension   .  Hypercholesterolemia   . GERD (gastroesophageal reflux disease)   . Diverticulosis   . Coronary artery disease   . Chronic kidney disease, stage 3b ([place])   . BPH (benign prostatic hyperplasia)   . Bilateral leg weakness 01/25/2019  . Bilateral impacted cerumen 10/27/2018  . Presbycusis of both ears 10/27/2018  . Paroxysmal atrial fibrillation ([place]) 09/21/2018  . Ischemic cardiomyopathy 09/21/2018  . Bilateral hearing loss due to cerumen impaction 04/02/2018  . Gross hematuria 01/28/2017  . Bilateral carpal tunnel syndrome 08/07/2016  . Primary osteoarthritis of both first carpometacarpal joints 08/07/2016  . Primary osteoarthritis of both hands 08/07/2016  . Lipoma of back 08/06/2016  . CKD (chronic kidney disease) stage 3, GFR 30-59 ml/min (HCC) 06/02/2016  . Anaphylactic reaction 06/02/2016  . Chronic systolic CHF (congestive heart failure) ([place])   . Chronic sinus bradycardia   . Mechanical instability of hip prosthesis ([place]) 09/06/2015  . Sinus bradycardia 11/14/2014  . Renal calculus 06/30/2013  . Hip dislocation 01/31/2012  . Osteoarthritis 08/12/2011  . COUGH 11/03/2008  . TUBULOVILLOUS ADENOMA, COLON 02/25/2008  . HLD (hyperlipidemia) 02/25/2008  . Hypertensive heart disease with heart failure ([place]) 02/25/2008  . MYOCARDIAL INFARCTION 02/25/2008  . Coronary artery disease involving native coronary artery of native heart with angina pectoris ([place]) 02/25/2008  . GERD 02/25/2008  . DIVERTICULOSIS, COLON 02/25/2008  . DEGENERATIVE JOINT DISEASE, CERVICAL SPINE 02/25/2008  . TRIGGER FINGER 02/25/2008  . HYPERGLYCEMIA 02/25/2008  . CARPAL TUNNEL SYNDROME, HX OF 02/25/2008  . SVT (supraventricular tachycardia) ([place]) 02/25/2008  . BPH with obstruction/lower urinary tract symptoms 02/25/2008  . Unspecified personal history presenting hazards to health 02/25/2008  . APPENDECTOMY, HX OF 02/25/2008  . PERCUTANEOUS TRANSLUMINAL CORONARY ANGIOPLASTY, HX OF 02/25/2008  . CARPAL  TUNNEL RELEASE, RIGHT, HX OF 02/25/2008  . NEPHROLITHIASIS, HX OF 02/25/2008  . Myocardial infarction [facility]) 2001   PCP:  Haze Boyden [facility]:   [place] [address] [place] [address] Phone: [phone] Fax: [phone]     Social Determinants of Health (SDOH) Interventions    Readmission Risk Interventions No flowsheet data found.

## 2021-04-09 NOTE — NC FL2 (Signed)
[place] LEVEL OF CARE SCREENING TOOL     IDENTIFICATION  Patient Name: [name] Birthdate: 27-Jan-1925 Sex: male Admission Date (Current Location): 04/08/2021  [facility] and [place] Number:  Herbalist and Address:  [facility] [place], [place] [address], [place], [place] [address]      Provider Number: [id number]  Attending Physician Name and Address:  Shelly Coss, MD  Relative Name and Phone Number:       Current Level of Care: Hospital Recommended Level of Care: [place] Prior Approval Number:    Date Approved/Denied:   PASRR Number: [id number]  Discharge Plan: SNF    Current Diagnoses: Patient Active Problem List   Diagnosis Date Noted  . Periprosthetic fracture of hip 04/08/2021  . Dementia ([place]) 04/08/2021  . Coagulopathy ([place])   . Anxiety disorder   . Tubular adenoma   . Sleep apnea   . Left hip pain   . Kidney stone   . IBS (irritable bowel syndrome)   . Hypertension   . Hypercholesterolemia   . GERD (gastroesophageal reflux disease)   . Diverticulosis   . Coronary artery disease   . Chronic kidney disease, stage 3b ([place])   . BPH (benign prostatic hyperplasia)   . Bilateral leg weakness 01/25/2019  . Bilateral impacted cerumen 10/27/2018  . Presbycusis of both ears 10/27/2018  . Paroxysmal atrial fibrillation ([place]) 09/21/2018  . Ischemic cardiomyopathy 09/21/2018  . Bilateral hearing loss due to cerumen impaction 04/02/2018  . Gross hematuria 01/28/2017  . Bilateral carpal tunnel syndrome 08/07/2016  . Primary osteoarthritis of both first carpometacarpal joints 08/07/2016  . Primary osteoarthritis of both hands 08/07/2016  . Lipoma of back 08/06/2016  . CKD (chronic kidney disease) stage 3, GFR 30-59 ml/min (HCC) 06/02/2016  . Anaphylactic reaction 06/02/2016  . Chronic systolic CHF (congestive heart failure) ([place])   . Chronic sinus bradycardia   . Mechanical instability of hip  prosthesis ([place]) 09/06/2015  . Sinus bradycardia 11/14/2014  . Renal calculus 06/30/2013  . Hip dislocation 01/31/2012  . Osteoarthritis 08/12/2011  . COUGH 11/03/2008  . TUBULOVILLOUS ADENOMA, COLON 02/25/2008  . HLD (hyperlipidemia) 02/25/2008  . Hypertensive heart disease with heart failure ([place]) 02/25/2008  . MYOCARDIAL INFARCTION 02/25/2008  . Coronary artery disease involving native coronary artery of native heart with angina pectoris ([place]) 02/25/2008  . GERD 02/25/2008  . DIVERTICULOSIS, COLON 02/25/2008  . DEGENERATIVE JOINT DISEASE, CERVICAL SPINE 02/25/2008  . TRIGGER FINGER 02/25/2008  . HYPERGLYCEMIA 02/25/2008  . CARPAL TUNNEL SYNDROME, HX OF 02/25/2008  . SVT (supraventricular tachycardia) ([place]) 02/25/2008  . BPH with obstruction/lower urinary tract symptoms 02/25/2008  . Unspecified personal history presenting hazards to health 02/25/2008  . APPENDECTOMY, HX OF 02/25/2008  . PERCUTANEOUS TRANSLUMINAL CORONARY ANGIOPLASTY, HX OF 02/25/2008  . CARPAL TUNNEL RELEASE, RIGHT, HX OF 02/25/2008  . NEPHROLITHIASIS, HX OF 02/25/2008  . Myocardial infarction ([place]) 2001    Orientation RESPIRATION BLADDER Height & Weight     Self,Time  Normal Continent Weight:   Height:     BEHAVIORAL SYMPTOMS/MOOD NEUROLOGICAL BOWEL NUTRITION STATUS      Continent Diet (refer to d/c summary)  AMBULATORY STATUS COMMUNICATION OF NEEDS Skin   Extensive Assist Verbally Normal                       Personal Care Assistance Level of Assistance  Bathing,Dressing,Feeding Bathing Assistance: Maximum assistance Feeding assistance: Independent Dressing Assistance: Maximum assistance  Functional Limitations Info  Sight,Hearing,Speech Sight Info: Adequate Hearing Info: Adequate Speech Info: Adequate    SPECIAL CARE FACTORS FREQUENCY  PT (By licensed PT),OT (By licensed OT)     PT Frequency: 5x/week, evaluate and treat OT Frequency: 5x/week, evaluate and treat             Contractures Contractures Info: Not present    Additional Factors Info  Code Status,Allergies Code Status Info: Full Code Allergies Info: Beef-derived Products, Acetaminophen, Alpha-gal, Amiodarone, Prilosec (Omeprazole), Prednisone           Current Medications (04/09/2021):  This is the current hospital active medication list Current Facility-Administered Medications  Medication Dose Route Frequency Provider Last Rate Last Admin  . acetaminophen (TYLENOL) tablet 650 mg  650 mg Oral Q6H Opyd, Ilene Qua, MD   650 mg at 04/09/21 1043  . apixaban (ELIQUIS) tablet 5 mg  5 mg Oral BID Opyd, Ilene Qua, MD   5 mg at 04/09/21 1043  . donepezil (ARICEPT) tablet 10 mg  10 mg Oral QHS Opyd, Ilene Qua, MD   10 mg at 04/08/21 2248  . escitalopram (LEXAPRO) tablet 10 mg  10 mg Oral Daily Opyd, Ilene Qua, MD   10 mg at 04/09/21 1043  . fentaNYL (SUBLIMAZE) injection 25 mcg  25 mcg Intravenous Q2H PRN Opyd, Ilene Qua, MD      . LORazepam (ATIVAN) tablet 0.5 mg  0.5 mg Oral BID Opyd, Ilene Qua, MD   0.5 mg at 04/09/21 1043  . metoprolol succinate (TOPROL-XL) 24 hr tablet 25 mg  25 mg Oral Daily Opyd, Ilene Qua, MD   25 mg at 04/09/21 1043  . oxyCODONE (Oxy IR/ROXICODONE) immediate release tablet 2.5 mg  2.5 mg Oral Q4H PRN Opyd, Ilene Qua, MD      . polyethylene glycol (MIRALAX / GLYCOLAX) packet 17 g  17 g Oral Daily PRN Opyd, Ilene Qua, MD      . sacubitril-valsartan (ENTRESTO) 49-51 mg per tablet  1 tablet Oral Daily Opyd, Ilene Qua, MD   1 tablet at 04/09/21 1043  . senna (SENOKOT) tablet 8.6 mg  1 tablet Oral BID Opyd, Ilene Qua, MD   8.6 mg at 04/09/21 1043  . tamsulosin (FLOMAX) capsule 0.4 mg  0.4 mg Oral Daily Opyd, Ilene Qua, MD   0.4 mg at 04/09/21 1043  . torsemide (DEMADEX) tablet 20 mg  20 mg Oral Daily Opyd, Ilene Qua, MD   20 mg at 04/09/21 1043     Discharge Medications: Please see discharge summary for a list of discharge medications.  Relevant Imaging Results:  Relevant Lab  Results:   Additional Information [id number]  Paulene Floor Finas Delone, LCSWA

## 2021-04-09 NOTE — Progress Notes (Addendum)
PROGRESS NOTE    [name]  DTO:[id number] DOB: 1925/04/02 DOA: 04/08/2021 PCP: Isidor Holts   Chief Complain: Hip pain after fall  Brief Narrative:  Patient is a 87 year old male with history of paroxysmal A. fib on Eliquis, chronic kidney disease stage IIIb, chronic systolic congestive heart failure, dementia, anxiety, coronary artery disease, history of CVA who presented after a fall resulting on hip pain.  He is ALF resident.  As per the report, he tripped on carpet and fell to the ground without losing consciousness or head injury.  Patient then started complaining of bilateral hip pain.  Patient has history of dementia so did not contribute to the history.  On presentation he was hemodynamically stable.  Imaging showed acute periprosthetic fracture about the left hip arthroplasty.  Orthopedics consulted and recommended nonoperative management.  PT/OT consulted and recommended skilled nursing facility on DC.  TOC consulted  Assessment & Plan:   Principal Problem:   Periprosthetic fracture of hip Active Problems:   Chronic systolic CHF (congestive heart failure) (HCC)   Paroxysmal atrial fibrillation (HCC)   Chronic kidney disease, stage 3b (HCC)   Dementia (HCC)   Periprosthetic hip fracture: Presented with bilateral hip pain after a fall.  He is ALF resident.  He has history of bilateral total hip replacement.  CT finding as above.  Orthopedics recommended nonoperative management.  Continue pain management, supportive care.  OT/PT consulted and recommended skilled nursing facility.  TOC consulted Orthopedics recommended outpatient follow-up.  Paroxysmal A. XIP:[id number] (CAD, CVA x 2, CHF, age x2) .  On metoprolol for rate control.  On Eliquis for anticoagulation.  We recommend to follow-up with his primary care physician for discussion about risks and benefit of continuing anticoagulation on this elderly, demented, high fall risk patient  Dementia:  Confused at baseline.  On Aricept, Lexapro, Ativan.  Continue delirium precautions.  Continue supportive care  CKD stage IIIb: Currently kidney function at baseline.  Monitor kidney function  Chronic systolic congestive heart failure:.  Euvolemic .  Echocardiogram done in 2016 showed ejection fraction of 30%.  On torsemide, metoprolol, Entresto.  Elevated BNP on presentation patient does not appear volume overloaded.  No peripheral edema, lungs are clear  Debility/deconditioning/disposition: PT/OT recommended skilled nursing facility.  Daughter and son interested on placing him to [facility] where his wife is also being moved.          DVT prophylaxis: Eliquis Code Status: Full Family Communication: Called and discussed with daughter and son on phone on 04/09/2021 Status is: Inpatient  Remains inpatient appropriate because:Inpatient level of care appropriate due to severity of illness   Dispo: The patient is from: ALF              Anticipated d/c is to: SNF              Patient currently is medically stable to d/c.   Difficult to place patient No    Consultants: Ortho  Procedures:None  Antimicrobials:  Anti-infectives (From admission, onward)   None      Subjective:  Patient seen and examined at the bedside this morning.  Hemodynamically stable during my evaluation.  Comfortable and sitting on the chair.  Denies any complaints.  Unable to communicate well with the patient due to hearing status and dementia Objective: Vitals:   04/08/21 2348 04/09/21 0100 04/09/21 0200 04/09/21 0436  BP: 134/86 122/82 125/84 136/79  Pulse: 85 77 79 75  Resp: 18 18 18 18   Temp:    [name])  97.5 F (36.4 C)  TempSrc:    Oral  SpO2: 98% 95% 97% 98%    Intake/Output Summary (Last 24 hours) at 04/09/2021 5621 Last data filed at 04/09/2021 0247 Gross per 24 hour  Intake --  Output 425 ml  Net -425 ml   There were no vitals filed for this visit.  Examination:  General exam: Overall  comfortable, not in distress, pleasantly confused elderly male HEENT: PERRL Respiratory system:  no wheezes or crackles  Cardiovascular system: S1 & S2 heard, RRR.  Gastrointestinal system: Abdomen is nondistended, soft and nontender. Central nervous system: Alert and awake but not oriented Extremities: No edema, no clubbing ,no cyanosis Skin: No rashes, no ulcers,no icterus     Data Reviewed: I have personally reviewed following labs and imaging studies  CBC: Recent Labs  Lab 04/08/21 1831 04/09/21 0602  WBC 8.5 8.8  NEUTROABS 6.0  --   HGB 12.1* 12.5*  HCT 38.5* 39.3  MCV 97.7 96.6  PLT 174 308   Basic Metabolic Panel: Recent Labs  Lab 04/08/21 1831 04/09/21 0602  NA 139 137  K 4.4 4.8  CL 106 105  CO2 26 26  GLUCOSE 127* 127*  BUN 24* 21  CREATININE 1.44* 1.41*  CALCIUM 9.2 9.2   GFR: Estimated Creatinine Clearance: 30.6 mL/min (A) (by C-G formula based on SCr of 1.41 mg/dL (H)). Liver Function Tests: No results for input(s): AST, ALT, ALKPHOS, BILITOT, PROT, ALBUMIN in the last 168 hours. No results for input(s): LIPASE, AMYLASE in the last 168 hours. No results for input(s): AMMONIA in the last 168 hours. Coagulation Profile: No results for input(s): INR, PROTIME in the last 168 hours. Cardiac Enzymes: No results for input(s): CKTOTAL, CKMB, CKMBINDEX, TROPONINI in the last 168 hours. BNP (last 3 results) Recent Labs    08/30/20 1147 11/28/20 1142 03/27/21 1102  PROBNP 5,254* 6,162* 6,477*   HbA1C: No results for input(s): HGBA1C in the last 72 hours. CBG: No results for input(s): GLUCAP in the last 168 hours. Lipid Profile: No results for input(s): CHOL, HDL, LDLCALC, TRIG, CHOLHDL, LDLDIRECT in the last 72 hours. Thyroid Function Tests: No results for input(s): TSH, T4TOTAL, FREET4, T3FREE, THYROIDAB in the last 72 hours. Anemia Panel: No results for input(s): VITAMINB12, FOLATE, FERRITIN, TIBC, IRON, RETICCTPCT in the last 72 hours. Sepsis  Labs: No results for input(s): PROCALCITON, LATICACIDVEN in the last 168 hours.  Recent Results (from the past 240 hour(s))  Resp Panel by RT-PCR (Flu A&B, Covid) Nasopharyngeal Swab     Status: None   Collection Time: 04/08/21  6:51 PM   Specimen: Nasopharyngeal Swab; Nasopharyngeal(NP) swabs in vial transport medium  Result Value Ref Range Status   SARS Coronavirus 2 by RT PCR NEGATIVE NEGATIVE Final    Comment: (NOTE) SARS-CoV-2 target nucleic acids are NOT DETECTED.  The SARS-CoV-2 RNA is generally detectable in upper respiratory specimens during the acute phase of infection. The lowest concentration of SARS-CoV-2 viral copies this assay can detect is 138 copies/mL. A negative result does not preclude SARS-Cov-2 infection and should not be used as the sole basis for treatment or other patient management decisions. A negative result may occur with  improper specimen collection/handling, submission of specimen other than nasopharyngeal swab, presence of viral mutation(s) within the areas targeted by this assay, and inadequate number of viral copies(<138 copies/mL). A negative result must be combined with clinical observations, patient history, and epidemiological information. The expected result is Negative.  Fact Sheet for Patients:  EntrepreneurPulse.com.au  Fact Sheet for Healthcare Providers:  IncredibleEmployment.be  This test is no t yet approved or cleared by the [place] FDA and  has been authorized for detection and/or diagnosis of SARS-CoV-2 by FDA under an Emergency Use Authorization (EUA). This EUA will remain  in effect (meaning this test can be used) for the duration of the COVID-19 declaration under Section 564(b)(1) of the Act, 21 U.S.C.section 360bbb-3(b)(1), unless the authorization is terminated  or revoked sooner.       Influenza A by PCR NEGATIVE NEGATIVE Final   Influenza B by PCR NEGATIVE NEGATIVE Final     Comment: (NOTE) The Xpert Xpress SARS-CoV-2/FLU/RSV plus assay is intended as an aid in the diagnosis of influenza from Nasopharyngeal swab specimens and should not be used as a sole basis for treatment. Nasal washings and aspirates are unacceptable for Xpert Xpress SARS-CoV-2/FLU/RSV testing.  Fact Sheet for Patients: EntrepreneurPulse.com.au  Fact Sheet for Healthcare Providers: IncredibleEmployment.be  This test is not yet approved or cleared by the [place] FDA and has been authorized for detection and/or diagnosis of SARS-CoV-2 by FDA under an Emergency Use Authorization (EUA). This EUA will remain in effect (meaning this test can be used) for the duration of the COVID-19 declaration under Section 564(b)(1) of the Act, 21 U.S.C. section 360bbb-3(b)(1), unless the authorization is terminated or revoked.  Performed at [facility], [place] [address], [place], [place] [address]          Radiology Studies: DG Elbow Complete Right  Result Date: 04/08/2021 CLINICAL DATA:  Fall with elbow pain. EXAM: RIGHT ELBOW - COMPLETE 3+ VIEW COMPARISON:  None. FINDINGS: There is no evidence of fracture, dislocation, or joint effusion. There is no evidence of arthropathy or other focal bone abnormality. Soft tissues are unremarkable. IMPRESSION: Negative. Electronically Signed   By: Zerita Boers M.D.   On: 04/08/2021 11:35   CT Head Wo Contrast  Result Date: 04/08/2021 CLINICAL DATA:  Head trauma.  Status post fall. EXAM: CT HEAD WITHOUT CONTRAST TECHNIQUE: Contiguous axial images were obtained from the base of the skull through the vertex without intravenous contrast. COMPARISON:  None. FINDINGS: Brain: No evidence of acute infarction, hemorrhage, hydrocephalus, extra-axial collection or mass lesion/mass effect. Remote right cerebellar hemisphere infarct. There is mild diffuse low-attenuation within the subcortical and periventricular white matter  compatible with chronic microvascular disease. Vascular: No hyperdense vessel or unexpected calcification. Skull: Normal. Negative for fracture or focal lesion. Sinuses/Orbits: No acute finding. Other: None IMPRESSION: 1. No acute intracranial abnormalities. 2. Chronic small vessel ischemic change and brain atrophy. 3. Remote right cerebellar hemisphere infarct. Electronically Signed   By: Kerby Moors M.D.   On: 04/08/2021 11:08   CT PELVIS WO CONTRAST  Result Date: 04/08/2021 CLINICAL DATA:  Left hip pain after injury. Fall. Possible loosening of hardware on radiograph. EXAM: CT PELVIS WITHOUT CONTRAST TECHNIQUE: Multidetector CT imaging of the pelvis was performed following the standard protocol without intravenous contrast. COMPARISON:  Pelvis and radiograph earlier today. Abdominopelvic CT 06/28/2019 FINDINGS: Urinary Tract: Decompressed distal ureters. Partially distended urinary bladder, obscured by streak artifact from bilateral hip arthroplasties. Bowel: Sigmoid diverticulosis without diverticulitis. No acute findings. Vascular/Lymphatic: Aortic and branch atherosclerosis. No pelvic adenopathy. Reproductive: Prostate appears prominent, but is obscured by streak artifact. Other:  Fat in both inguinal canals.  No pelvic free fluid. Musculoskeletal: Left hip arthroplasty. There is an acute periprosthetic fracture involving the lateral aspect of the greater trochanter, extending to the femoral stem. Fracture is mildly displaced.  Nondisplaced fracture involvement of the anterior as well as lateral subtrochanteric femur. Underlying lobulated lucency surrounding the femoral stem, as well as acetabular component of left hip arthroplasty appears similar to prior CT were visualized, and consistent with underlying particle disease. No additional pelvic fracture. Pubic rami and bony pelvis are intact. Right hip arthroplasty without evidence of periprosthetic fracture or lucency. IMPRESSION: 1. Acute  periprosthetic fracture about left hip arthroplasty involving the lateral aspect of the greater trochanter, extending to the femoral stem. Fracture is mildly displaced. Nondisplaced component involves the anterior as well as lateral subtrochanteric femur. 2. Underlying lobulated lucency surrounding the femoral stem, as well as acetabular component of left hip arthroplasty appears similar to prior CT were visualized, and suggestive of underlying particle disease. 3. Right hip arthroplasty without evidence of periprosthetic fracture or lucency. Aortic Atherosclerosis (ICD10-I70.0). Electronically Signed   By: Keith Rake M.D.   On: 04/08/2021 20:09   DG Hips Bilat W or Wo Pelvis 3-4 Views  Result Date: 04/08/2021 CLINICAL DATA:  Fall with pain EXAM: DG HIP (WITH OR WITHOUT PELVIS) 3-4V BILAT COMPARISON:  Hip radiographs dated 09/24/2018. FINDINGS: The patient is status post bilateral total hip arthroplasties. No evidence of hip dislocation on either side. There is significant lucency around the acetabular component on the left which may reflect loosening or particle disease. There is significant demineralization of the proximal left femur and suggestion of a fracture of the greater trochanter (the patient's left hand is overlying the area of interest on the pelvis AP view). There is no evidence of loosening of either femoral shaft component of the hip arthroplasties. No pelvic fracture is identified. IMPRESSION: 1. Significant demineralization of the proximal left femur and suggestion of a fracture of the greater trochanter (the patient's left hand is overlying the area of interest on the pelvis AP view). If there is continued clinical concern of a left hip fracture, repeat left hip radiographs could be considered. 2. Significant lucency around the acetabular component on the left which may reflect loosening or particle disease. Electronically Signed   By: Zerita Boers M.D.   On: 04/08/2021 11:44         Scheduled Meds: . acetaminophen  650 mg Oral Q6H  . apixaban  5 mg Oral BID  . donepezil  10 mg Oral QHS  . escitalopram  10 mg Oral Daily  . LORazepam  0.5 mg Oral BID  . metoprolol succinate  25 mg Oral Daily  .  morphine injection  4 mg Intravenous Once  . sacubitril-valsartan  1 tablet Oral Daily  . senna  1 tablet Oral BID  . tamsulosin  0.4 mg Oral Daily  . torsemide  20 mg Oral Daily   Continuous Infusions:   LOS: 1 day    Time spent: More than 50% of that time was spent in counseling and/or coordination of care.      Shelly Coss, MD [facility] P4/25/2022, 8:07 AM

## 2021-04-09 NOTE — Evaluation (Signed)
Physical Therapy Evaluation Patient Details Name: [name] MRN: [id number] DOB: 07/16/1925 Today's Date: 04/09/2021   History of Present Illness  [name] is a 87 y.o. male came in with hip pain s/p fail. Pt found to have L periprosthetic hip fx, treating no-op with L LE TDWB 6-8 wks for chair to bed trasnfer PMH: atrial fibrillation on Eliquis, chronic kidney disease stage IIIb, chronic systolic CHF, dementia, anxiety, coronary artery disease, and history of CVA PSH: bilat THA and R  revision    Clinical Impression  Pt admitted with above. Pt eager and motivated to move however with no comprehension of L LE TWB requiring max verbal and tactile cues to maintain during std pvt transfers to [facility] and then to chair. Highly unlikely patient will adhere to L LE TWB especially when pain diminishes. Pt did attempt to hop on R foot to [facility] however was still putting weight through L LE. Pt with need SNF upon d/c prior to transitioning back to ALF. Pt to benefit from w/c as primary mode of mobility until pt is allowed to WB on L LE. Acute PT to cont to follow.    Follow Up Recommendations SNF;Supervision/Assistance - 24 hour    Equipment Recommendations  Rolling walker with 5" wheels , w/c   Recommendations for Other Services       Precautions / Restrictions Precautions Precautions: Fall Precaution Comments: very HOH, dementia Restrictions Weight Bearing Restrictions: Yes LLE Weight Bearing: Touchdown weight bearing Other Position/Activity Restrictions: per Dr. Alvan Dame, pt TWB on L LE for transfers only for 6-8 weeks      Mobility  Bed Mobility Overal bed mobility: Needs Assistance Bed Mobility: Supine to Sit     Supine to sit: Mod assist;HOB elevated     General bed mobility comments: pt initiating transfer with both UEs and LEs but ultimately required modA for L LE management and trunk elevation, increased time, max directional verbal cues    Transfers Overall transfer  level: Needs assistance   Transfers: Sit to/from Stand Sit to Stand: Mod assist;+2 safety/equipment         General transfer comment: modA to power up, max verbal cues for TWB on L LE however  pt unable to maintain, with max verbal cues pt attempted to hop on R foot during std pvt to [facility] with PTs foot under pt's L foot, pt atleast putting 50% weight through it, pt then std pvt to chair, again unable to maintain L LE TWB despite max verbal cues  Ambulation/Gait             General Gait Details: unable as orders are for L LE TWB for transfers to/from chair only  Stairs            Wheelchair Mobility    Modified Rankin (Stroke Patients Only)       Balance Overall balance assessment: Needs assistance Sitting-balance support: Feet supported;No upper extremity supported Sitting balance-Leahy Scale: Good     Standing balance support: Bilateral upper extremity supported;During functional activity Standing balance-Leahy Scale: Poor Standing balance comment: needs RW                             Pertinent Vitals/Pain Pain Assessment: Faces Faces Pain Scale: Hurts even more Pain Location: L hip with mobility Pain Descriptors / Indicators: Guarding;Grimacing Pain Intervention(s): Monitored during session    Home Living Family/patient expects to be discharged to:: Assisted living  Additional Comments: pt poor historian as pt very hard of hearing, per chart pt from ALF    Prior Function Level of Independence: Needs assistance   Gait / Transfers Assistance Needed: pt reports using a RW "sometimes"  ADL's / Homemaking Assistance Needed: reports doing on own  Comments: pt extremely HOH and unsure of accuracy of report of PLOF     Hand Dominance   Dominant Hand: Right    Extremity/Trunk Assessment   Upper Extremity Assessment Upper Extremity Assessment: Generalized weakness    Lower Extremity Assessment Lower Extremity  Assessment: LLE deficits/detail LLE Deficits / Details: limited WBing tolerance due to fracture, guarded ROM due to pain    Cervical / Trunk Assessment Cervical / Trunk Assessment: Normal  Communication   Communication: HOH  Cognition Arousal/Alertness: Awake/alert Behavior During Therapy: WFL for tasks assessed/performed Overall Cognitive Status: History of cognitive impairments - at baseline                                 General Comments: pt with known history of dementia but is alert and oriented x 3, pt very HOH which could definitely be contributing to command follow. Pt with decreased insight to L LE situation and precaution despite knowing he fell and hurt his L LE.      General Comments General comments (skin integrity, edema, etc.): pt assisted to [facility], urinated, assist for hygiene, VSS on 3LO2 via [place]    Exercises     Assessment/Plan    PT Assessment Patient needs continued PT services  PT Problem List Decreased strength;Decreased activity tolerance;Decreased balance;Decreased mobility;Decreased coordination;Decreased cognition;Decreased knowledge of use of DME;Decreased safety awareness       PT Treatment Interventions DME instruction;Gait training;Stair training;Functional mobility training;Therapeutic activities;Therapeutic exercise;Balance training;Neuromuscular re-education    PT Goals (Current goals can be found in the Care Plan section)  Acute Rehab PT Goals Patient Stated Goal: walk PT Goal Formulation: With patient Time For Goal Achievement: 04/23/21 Potential to Achieve Goals: Good    Frequency Min 3X/week   Barriers to discharge        Co-evaluation               AM-PAC PT "6 Clicks" Mobility  Outcome Measure Help needed turning from your back to your side while in a flat bed without using bedrails?: A Lot Help needed moving from lying on your back to sitting on the side of a flat bed without using bedrails?: A Lot Help needed  moving to and from a bed to a chair (including a wheelchair)?: A Lot Help needed standing up from a chair using your arms (e.g., wheelchair or bedside chair)?: A Lot Help needed to walk in hospital room?: Total Help needed climbing 3-5 steps with a railing? : Total 6 Click Score: 10    End of Session Equipment Utilized During Treatment: Gait belt;Oxygen Activity Tolerance: Patient tolerated treatment well Patient left: in chair;with call bell/phone within reach;with chair alarm set Nurse Communication: Mobility status;Weight bearing status (L LE TWB for transfers only, no ambulation) PT Visit Diagnosis: Unsteadiness on feet (R26.81);Difficulty in walking, not elsewhere classified (R26.2)    Time: 0810-0829 PT Time Calculation (min) (ACUTE ONLY): 19 min   Charges:   PT Evaluation $PT Eval Moderate Complexity: 1 Mod          Kittie Plater, PT, DPT Acute Rehabilitation Services Pager #: [phone] Office #: [phone]   Berline Lopes  04/09/2021, 9:50 AM

## 2021-04-09 NOTE — Progress Notes (Signed)
Received patient from ED awake,alert and oriented to self and time. Patient disoriented to place but easily reoriented at this time. Patient able to verbalize needs. NAD noted; respirations even on 2L O2 via nasal cannula. Movement/sensation noted to all extremities. Skin tear noted to R elbow, clensed and new dressing applied. Foam dressing placed to sacrum for prevention. Continuous pulse ox connected to patient at this time. Oriented to room and floor. Whiteboard updated. All safety measures in place, call light within reach, bed locked and in lowest position and, floor mats in use and bed alarm activated.

## 2021-04-10 ENCOUNTER — Inpatient Hospital Stay (HOSPITAL_COMMUNITY): Payer: Medicare Other

## 2021-04-10 DIAGNOSIS — M978XXD Periprosthetic fracture around other internal prosthetic joint, subsequent encounter: Secondary | ICD-10-CM

## 2021-04-10 MED ORDER — POLYETHYLENE GLYCOL 3350 17 G PO PACK: 17.0000 g | PACK | Freq: Every day | ORAL | 0 refills | Status: DC | PRN

## 2021-04-10 MED ORDER — LORAZEPAM 0.5 MG PO TABS: 0.5000 mg | ORAL_TABLET | Freq: Two times a day (BID) | ORAL | 0 refills | Status: DC

## 2021-04-10 MED ORDER — OXYCODONE HCL 5 MG PO TABS: 5.0000 mg | ORAL_TABLET | Freq: Four times a day (QID) | ORAL | 0 refills | Status: DC | PRN

## 2021-04-10 MED ORDER — SENNA 8.6 MG PO TABS: 1.0000 | ORAL_TABLET | Freq: Two times a day (BID) | ORAL | 0 refills | Status: DC

## 2021-04-10 NOTE — TOC CAGE-AID Note (Signed)
Transition of Care [facility]) - CAGE-AID Screening   Patient Details  Name: [name] MRN: [id number] Date of Birth: 22-Aug-1925  Transition of [facility] CM/SW Contact:    Clovis Cao, RN Phone Number: [phone] 04/10/2021, 11:10 AM   Clinical Narrative: Pt denies alcohol or drug use.   CAGE-AID Screening:    Have You Ever Felt You Ought to Cut Down on Your Drinking or Drug Use?: No Have People Annoyed You By Critizing Your Drinking Or Drug Use?: No Have You Felt Bad Or Guilty About Your Drinking Or Drug Use?: No Have You Ever Had a Drink or Used Drugs First Thing In The Morning to Steady Your Nerves or to Get Rid of a Hangover?: No CAGE-AID Score: 0  Substance Abuse Education Offered: No

## 2021-04-10 NOTE — Discharge Summary (Addendum)
Physician Discharge Summary  [name] HFW:[id number] DOB: Oct 13, 1925 DOA: 04/08/2021  PCP: [facility]  Admit date: 04/08/2021 Discharge date: 04/11/21 Admitted From: ALF Disposition:  SNF  Discharge Condition:Stable CODE STATUS:FULL Diet recommendation: low potassium, heart healthy  Follow up: f/u with pcp in one week Need blood work done in 2 days.   Brief/Interim Summary:  Patient is a 87 year old male with history of paroxysmal A. fib on Eliquis, chronic kidney disease stage IIIb, chronic systolic congestive heart failure, dementia, anxiety, coronary artery disease, history of CVA who presented after a fall resulting on hip pain.  He is ALF resident.  As per the report, he tripped on carpet and fell to the ground without losing consciousness or head injury.  Patient then started complaining of bilateral hip pain.  Patient has history of dementia so did not contribute to the history.  On presentation he was hemodynamically stable.  Imaging showed acute periprosthetic fracture about the left hip arthroplasty.  Orthopedics consulted and recommended nonoperative management.  PT/OT consulted and recommended skilled nursing facility on DC.    Patient is medically stable for transfer to skilled nursing facility today.  04/11/21: no overnight issues. No complaints.   Following problems were addressed during his hospitalization:  Periprosthetic hip fracture: Presented with bilateral hip pain after a fall.  He is ALF resident.  He has history of bilateral total hip replacement.  CT finding as above.  Orthopedics recommended nonoperative management.  Continue pain management, supportive care.  OT/PT consulted and recommended skilled nursing facility.  TOC consulted Orthopedics recommended outpatient follow-up.  Paroxysmal A. OYD:[id number]-NOMV 6 (CAD, CVA x 2, CHF, age x2).  On metoprolol for rate control.  On Eliquis for anticoagulation.  We recommend to follow-up with his  primary care physician for discussion about risks and benefit of continuing anticoagulation on this elderly, demented, high fall risk patient  Dementia: Confused at baseline.  On Aricept, Lexapro, Ativan.  Continue delirium precautions.  Continue supportive care  CKD stage IIIb: Currently kidney function at baseline.  Monitor kidney function  Chronic systolic congestive heart failure:.  Euvolemic .  Echocardiogram done in 2016 showed ejection fraction of 30%.  On torsemide, metoprolol, Entresto.  Elevated BNP on presentation patient does not appear volume overloaded.  No peripheral edema, lungs are clear.  Chest x-ray did not show any signs of volume overload.  He occasionally require supplemental oxygen during this hospitalization but currently he is saturating fine on room air.  Debility/deconditioning/disposition: PT/OT recommended skilled nursing facility.  Daughter and son interested on placing him to [facility] where his wife is also being moved.   Discharge Diagnoses:  Principal Problem:   Periprosthetic fracture of hip Active Problems:   Chronic systolic CHF (congestive heart failure) (HCC)   Paroxysmal atrial fibrillation (HCC)   Chronic kidney disease, stage 3b (HCC)   Dementia (HCC)    Discharge Instructions  Discharge Instructions    Diet - low sodium heart healthy   Complete by: As directed    Discharge instructions   Complete by: As directed    1)Please take prescribed medications as instructed. 2)Follow up with orthopedics in 2 weeks.  Name and number of the provider has been attached.   Increase activity slowly   Complete by: As directed      Allergies as of 04/10/2021      Reactions   Beef-derived Products Hives   Severe hives, nausea, throat swelling   Acetaminophen Other (See Comments)   urticaria urticaria  Alpha-gal    Per mar   Amiodarone Other (See Comments)   Unknown Affected my breathing   Prilosec [omeprazole]    Diarrhea, stomach cramps    Prednisone Palpitations   REACTION: causes tachycardia, increase BP      Medication List    TAKE these medications   acetaminophen 500 MG tablet Commonly known as: TYLENOL Take 250 mg by mouth every 4 (four) hours as needed for mild pain.   Dextromethorphan HBr 10 MG/5ML Syrp Take 5 mLs by mouth every 4 (four) hours as needed (cough/elevated temp).   donepezil 10 MG tablet Commonly known as: ARICEPT Take 10 mg by mouth at bedtime.   Eliquis 2.5 MG Tabs tablet Generic drug: apixaban TAKE 1 TABLET BY MOUTH TWICE A DAY What changed: how much to take   Entresto 49-51 MG Generic drug: sacubitril-valsartan Take 1 tablet by mouth 2 (two) times daily. What changed: when to take this   escitalopram 10 MG tablet Commonly known as: LEXAPRO Take 10 mg by mouth daily.   ferrous sulfate 325 (65 FE) MG tablet Take 325 mg by mouth 2 (two) times daily with a meal.   loperamide 2 MG tablet Commonly known as: IMODIUM A-D Take 2 mg by mouth every 4 (four) hours as needed for diarrhea or loose stools.   LORazepam 0.5 MG tablet Commonly known as: ATIVAN Take 1 tablet (0.5 mg total) by mouth 2 (two) times daily.   metoprolol succinate 25 MG 24 hr tablet Commonly known as: TOPROL-XL Take 25 mg by mouth daily.   oxyCODONE 5 MG immediate release tablet Commonly known as: Roxicodone Take 1 tablet (5 mg total) by mouth every 6 (six) hours as needed for severe pain.   polyethylene glycol 17 g packet Commonly known as: MIRALAX / GLYCOLAX Take 17 g by mouth daily as needed for mild constipation.   senna 8.6 MG Tabs tablet Commonly known as: SENOKOT Take 1 tablet (8.6 mg total) by mouth 2 (two) times daily.   tamsulosin 0.4 MG Caps capsule Commonly known as: FLOMAX Take 0.4 mg by mouth daily.   torsemide 20 MG tablet Commonly known as: DEMADEX Take 1 tablet (20 mg total) by mouth daily.       Follow-up Information    [facility], [facility] Schedule an appointment as soon as possible  for a visit in 1 week.   Specialty: Specialist Contact information: [address] [place] [address] [phone]        Paralee Cancel, MD Follow up in 2 week(s).   Specialty: Orthopedic Surgery Why: Xray follow up Contact information: [address] [place] [address] [phone]              Allergies  Allergen Reactions  . Beef-Derived Products Hives    Severe hives, nausea, throat swelling  . Acetaminophen Other (See Comments)    urticaria urticaria  . Alpha-Gal     Per mar  . Amiodarone Other (See Comments)    Unknown  Affected my breathing  . Prilosec [Omeprazole]     Diarrhea, stomach cramps  . Prednisone Palpitations    REACTION: causes tachycardia, increase BP    Consultations:  Orthopedics   Procedures/Studies: DG Elbow Complete Right  Result Date: 04/08/2021 CLINICAL DATA:  Fall with elbow pain. EXAM: RIGHT ELBOW - COMPLETE 3+ VIEW COMPARISON:  None. FINDINGS: There is no evidence of fracture, dislocation, or joint effusion. There is no evidence of arthropathy or other focal bone abnormality. Soft tissues  are unremarkable. IMPRESSION: Negative. Electronically Signed   By: Zerita Boers M.D.   On: 04/08/2021 11:35   CT Head Wo Contrast  Result Date: 04/08/2021 CLINICAL DATA:  Head trauma.  Status post fall. EXAM: CT HEAD WITHOUT CONTRAST TECHNIQUE: Contiguous axial images were obtained from the base of the skull through the vertex without intravenous contrast. COMPARISON:  None. FINDINGS: Brain: No evidence of acute infarction, hemorrhage, hydrocephalus, extra-axial collection or mass lesion/mass effect. Remote right cerebellar hemisphere infarct. There is mild diffuse low-attenuation within the subcortical and periventricular white matter compatible with chronic microvascular disease. Vascular: No hyperdense vessel or unexpected calcification. Skull: Normal. Negative for fracture or focal lesion. Sinuses/Orbits: No  acute finding. Other: None IMPRESSION: 1. No acute intracranial abnormalities. 2. Chronic small vessel ischemic change and brain atrophy. 3. Remote right cerebellar hemisphere infarct. Electronically Signed   By: Kerby Moors M.D.   On: 04/08/2021 11:08   CT PELVIS WO CONTRAST  Result Date: 04/08/2021 CLINICAL DATA:  Left hip pain after injury. Fall. Possible loosening of hardware on radiograph. EXAM: CT PELVIS WITHOUT CONTRAST TECHNIQUE: Multidetector CT imaging of the pelvis was performed following the standard protocol without intravenous contrast. COMPARISON:  Pelvis and radiograph earlier today. Abdominopelvic CT 06/28/2019 FINDINGS: Urinary Tract: Decompressed distal ureters. Partially distended urinary bladder, obscured by streak artifact from bilateral hip arthroplasties. Bowel: Sigmoid diverticulosis without diverticulitis. No acute findings. Vascular/Lymphatic: Aortic and branch atherosclerosis. No pelvic adenopathy. Reproductive: Prostate appears prominent, but is obscured by streak artifact. Other:  Fat in both inguinal canals.  No pelvic free fluid. Musculoskeletal: Left hip arthroplasty. There is an acute periprosthetic fracture involving the lateral aspect of the greater trochanter, extending to the femoral stem. Fracture is mildly displaced. Nondisplaced fracture involvement of the anterior as well as lateral subtrochanteric femur. Underlying lobulated lucency surrounding the femoral stem, as well as acetabular component of left hip arthroplasty appears similar to prior CT were visualized, and consistent with underlying particle disease. No additional pelvic fracture. Pubic rami and bony pelvis are intact. Right hip arthroplasty without evidence of periprosthetic fracture or lucency. IMPRESSION: 1. Acute periprosthetic fracture about left hip arthroplasty involving the lateral aspect of the greater trochanter, extending to the femoral stem. Fracture is mildly displaced. Nondisplaced component  involves the anterior as well as lateral subtrochanteric femur. 2. Underlying lobulated lucency surrounding the femoral stem, as well as acetabular component of left hip arthroplasty appears similar to prior CT were visualized, and suggestive of underlying particle disease. 3. Right hip arthroplasty without evidence of periprosthetic fracture or lucency. Aortic Atherosclerosis (ICD10-I70.0). Electronically Signed   By: Keith Rake M.D.   On: 04/08/2021 20:09   DG CHEST PORT 1 VIEW  Result Date: 04/10/2021 CLINICAL DATA:  Recent fall.  Respiratory failure and hypoxia. EXAM: PORTABLE CHEST 1 VIEW COMPARISON:  11/22/2015 FINDINGS: The cardiac silhouette is enlarged. There is aortic atherosclerotic calcification. There is mild chronic scarring at the lung bases. No evidence of active infiltrate, collapse or effusion. No pneumothorax. IMPRESSION: No acute finding. Cardiomegaly. Aortic atherosclerosis. Mild chronic scarring at the lung bases. Electronically Signed   By: Nelson Chimes M.D.   On: 04/10/2021 09:55   DG Hips Bilat W or Wo Pelvis 3-4 Views  Result Date: 04/08/2021 CLINICAL DATA:  Fall with pain EXAM: DG HIP (WITH OR WITHOUT PELVIS) 3-4V BILAT COMPARISON:  Hip radiographs dated 09/24/2018. FINDINGS: The patient is status post bilateral total hip arthroplasties. No evidence of hip dislocation on either side. There is significant lucency around  the acetabular component on the left which may reflect loosening or particle disease. There is significant demineralization of the proximal left femur and suggestion of a fracture of the greater trochanter (the patient's left hand is overlying the area of interest on the pelvis AP view). There is no evidence of loosening of either femoral shaft component of the hip arthroplasties. No pelvic fracture is identified. IMPRESSION: 1. Significant demineralization of the proximal left femur and suggestion of a fracture of the greater trochanter (the patient's left  hand is overlying the area of interest on the pelvis AP view). If there is continued clinical concern of a left hip fracture, repeat left hip radiographs could be considered. 2. Significant lucency around the acetabular component on the left which may reflect loosening or particle disease. Electronically Signed   By: Romona Curlsyler  Litton M.D.   On: 04/08/2021 11:44       Subjective: Patient seen and examined at the bedside this morning.  Comfortable.  No active issues.  Medically stable for discharge  Discharge Exam: Vitals:   04/10/21 0326 04/10/21 0700  BP: 114/74 136/72  Pulse: 79 84  Resp: 16 18  Temp: (!) 97.4 F (36.3 C) 97.6 F (36.4 C)  SpO2: 98% 100%   Vitals:   04/09/21 2114 04/09/21 2203 04/10/21 0326 04/10/21 0700  BP:  104/66 114/74 136/72  Pulse: 85 80 79 84  Resp:   16 18  Temp:   (!) 97.4 F (36.3 C) 97.6 F (36.4 C)  TempSrc:   Oral Oral  SpO2: 99% 100% 98% 100%    General: Pt is alert, awake, not in acute distress, pleasantly confused elderly gentleman Cardiovascular: RRR, S1/S2 +, no rubs, no gallops Respiratory: CTA bilaterally, no wheezing, no rhonchi Abdominal: Soft, NT, ND, bowel sounds + Extremities: no edema, no cyanosis    The results of significant diagnostics from this hospitalization (including imaging, microbiology, ancillary and laboratory) are listed below for reference.     Microbiology: Recent Results (from the past 240 hour(s))  Resp Panel by RT-PCR (Flu A&B, Covid) Nasopharyngeal Swab     Status: None   Collection Time: 04/08/21  6:51 PM   Specimen: Nasopharyngeal Swab; Nasopharyngeal(NP) swabs in vial transport medium  Result Value Ref Range Status   SARS Coronavirus 2 by RT PCR NEGATIVE NEGATIVE Final    Comment: (NOTE) SARS-CoV-2 target nucleic acids are NOT DETECTED.  The SARS-CoV-2 RNA is generally detectable in upper respiratory specimens during the acute phase of infection. The lowest concentration of SARS-CoV-2 viral copies  this assay can detect is 138 copies/mL. A negative result does not preclude SARS-Cov-2 infection and should not be used as the sole basis for treatment or other patient management decisions. A negative result may occur with  improper specimen collection/handling, submission of specimen other than nasopharyngeal swab, presence of viral mutation(s) within the areas targeted by this assay, and inadequate number of viral copies(<138 copies/mL). A negative result must be combined with clinical observations, patient history, and epidemiological information. The expected result is Negative.  Fact Sheet for Patients:  BloggerCourse.comhttps://www.fda.gov/media/152166/download  Fact Sheet for Healthcare Providers:  SeriousBroker.ithttps://www.fda.gov/media/152162/download  This test is no t yet approved or cleared by the [place] FDA and  has been authorized for detection and/or diagnosis of SARS-CoV-2 by FDA under an Emergency Use Authorization (EUA). This EUA will remain  in effect (meaning this test can be used) for the duration of the COVID-19 declaration under Section 564(b)(1) of the Act, 21 U.S.C.section 360bbb-3(b)(1), unless the  authorization is terminated  or revoked sooner.       Influenza A by PCR NEGATIVE NEGATIVE Final   Influenza B by PCR NEGATIVE NEGATIVE Final    Comment: (NOTE) The Xpert Xpress SARS-CoV-2/FLU/RSV plus assay is intended as an aid in the diagnosis of influenza from Nasopharyngeal swab specimens and should not be used as a sole basis for treatment. Nasal washings and aspirates are unacceptable for Xpert Xpress SARS-CoV-2/FLU/RSV testing.  Fact Sheet for Patients: EntrepreneurPulse.com.au  Fact Sheet for Healthcare Providers: IncredibleEmployment.be  This test is not yet approved or cleared by the [place] FDA and has been authorized for detection and/or diagnosis of SARS-CoV-2 by FDA under an Emergency Use Authorization (EUA). This EUA  will remain in effect (meaning this test can be used) for the duration of the COVID-19 declaration under Section 564(b)(1) of the Act, 21 U.S.C. section 360bbb-3(b)(1), unless the authorization is terminated or revoked.  Performed at [facility], [place] [address], [place], [place] [address]      Labs: BNP (last 3 results) No results for input(s): BNP in the last 8760 hours. Basic Metabolic Panel: Recent Labs  Lab 04/08/21 1831 04/09/21 0602  NA 139 137  K 4.4 4.8  CL 106 105  CO2 26 26  GLUCOSE 127* 127*  BUN 24* 21  CREATININE 1.44* 1.41*  CALCIUM 9.2 9.2   Liver Function Tests: No results for input(s): AST, ALT, ALKPHOS, BILITOT, PROT, ALBUMIN in the last 168 hours. No results for input(s): LIPASE, AMYLASE in the last 168 hours. No results for input(s): AMMONIA in the last 168 hours. CBC: Recent Labs  Lab 04/08/21 1831 04/09/21 0602  WBC 8.5 8.8  NEUTROABS 6.0  --   HGB 12.1* 12.5*  HCT 38.5* 39.3  MCV 97.7 96.6  PLT 174 162   Cardiac Enzymes: No results for input(s): CKTOTAL, CKMB, CKMBINDEX, TROPONINI in the last 168 hours. BNP: Invalid input(s): POCBNP CBG: No results for input(s): GLUCAP in the last 168 hours. D-Dimer No results for input(s): DDIMER in the last 72 hours. Hgb A1c No results for input(s): HGBA1C in the last 72 hours. Lipid Profile No results for input(s): CHOL, HDL, LDLCALC, TRIG, CHOLHDL, LDLDIRECT in the last 72 hours. Thyroid function studies No results for input(s): TSH, T4TOTAL, T3FREE, THYROIDAB in the last 72 hours.  Invalid input(s): FREET3 Anemia work up No results for input(s): VITAMINB12, FOLATE, FERRITIN, TIBC, IRON, RETICCTPCT in the last 72 hours. Urinalysis    Component Value Date/Time   COLORURINE YELLOW 06/02/2016 [place] 06/02/2016 0545   LABSPEC 1.024 06/02/2016 0545   PHURINE 5.5 06/02/2016 0545   GLUCOSEU NEGATIVE 06/02/2016 0545   HGBUR NEGATIVE 06/02/2016 0545   BILIRUBINUR  NEGATIVE 06/02/2016 0545   KETONESUR NEGATIVE 06/02/2016 0545   PROTEINUR NEGATIVE 06/02/2016 0545   UROBILINOGEN 0.2 08/31/2015 0807   NITRITE NEGATIVE 06/02/2016 0545   LEUKOCYTESUR NEGATIVE 06/02/2016 0545   Sepsis Labs Invalid input(s): PROCALCITONIN,  WBC,  LACTICIDVEN Microbiology Recent Results (from the past 240 hour(s))  Resp Panel by RT-PCR (Flu A&B, Covid) Nasopharyngeal Swab     Status: None   Collection Time: 04/08/21  6:51 PM   Specimen: Nasopharyngeal Swab; Nasopharyngeal(NP) swabs in vial transport medium  Result Value Ref Range Status   SARS Coronavirus 2 by RT PCR NEGATIVE NEGATIVE Final    Comment: (NOTE) SARS-CoV-2 target nucleic acids are NOT DETECTED.  The SARS-CoV-2 RNA is generally detectable in upper respiratory specimens during the acute phase of  infection. The lowest concentration of SARS-CoV-2 viral copies this assay can detect is 138 copies/mL. A negative result does not preclude SARS-Cov-2 infection and should not be used as the sole basis for treatment or other patient management decisions. A negative result may occur with  improper specimen collection/handling, submission of specimen other than nasopharyngeal swab, presence of viral mutation(s) within the areas targeted by this assay, and inadequate number of viral copies(<138 copies/mL). A negative result must be combined with clinical observations, patient history, and epidemiological information. The expected result is Negative.  Fact Sheet for Patients:  EntrepreneurPulse.com.au  Fact Sheet for Healthcare Providers:  IncredibleEmployment.be  This test is no t yet approved or cleared by the [place] FDA and  has been authorized for detection and/or diagnosis of SARS-CoV-2 by FDA under an Emergency Use Authorization (EUA). This EUA will remain  in effect (meaning this test can be used) for the duration of the COVID-19 declaration under Section  564(b)(1) of the Act, 21 U.S.C.section 360bbb-3(b)(1), unless the authorization is terminated  or revoked sooner.       Influenza A by PCR NEGATIVE NEGATIVE Final   Influenza B by PCR NEGATIVE NEGATIVE Final    Comment: (NOTE) The Xpert Xpress SARS-CoV-2/FLU/RSV plus assay is intended as an aid in the diagnosis of influenza from Nasopharyngeal swab specimens and should not be used as a sole basis for treatment. Nasal washings and aspirates are unacceptable for Xpert Xpress SARS-CoV-2/FLU/RSV testing.  Fact Sheet for Patients: EntrepreneurPulse.com.au  Fact Sheet for Healthcare Providers: IncredibleEmployment.be  This test is not yet approved or cleared by the [place] FDA and has been authorized for detection and/or diagnosis of SARS-CoV-2 by FDA under an Emergency Use Authorization (EUA). This EUA will remain in effect (meaning this test can be used) for the duration of the COVID-19 declaration under Section 564(b)(1) of the Act, 21 U.S.C. section 360bbb-3(b)(1), unless the authorization is terminated or revoked.  Performed at [facility], [place] [address], [place], [place] [address]     Please note: You were cared for by a hospitalist during your hospital stay. Once you are discharged, your primary care physician will handle any further medical issues. Please note that NO REFILLS for any discharge medications will be authorized once you are discharged, as it is imperative that you return to your primary care physician (or establish a relationship with a primary care physician if you do not have one) for your post hospital discharge needs so that they can reassess your need for medications and monitor your lab values.    Time coordinating discharge: 40 minutes  SIGNED:   Shelly Coss, MD  [facility] 04/10/2021, 11:50 AM Pager [phone]  If 7PM-7AM, please contact night-coverage [email or web address] Password [email or web address]

## 2021-04-10 NOTE — TOC Progression Note (Addendum)
Transition of Care [facility]) - Progression Note    Patient Details  Name: [name] MRN: [id number] Date of Birth: 25-Mar-1925  Transition of Care [facility]) CM/SW Contact  Sharin Mons, RN Phone Number: 04/10/2021, 11:59 AM  Clinical Narrative:    Per [facility] SN[facility] bed will be available tomorrow am. Last resulted COVID noted on 4/24, pt will NOT need updated COVID  Per [facility] admission/Star.  TOC  Team will continue to monitor and assist with TOC needs.   Expected Discharge Plan: Skilled Nursing Facility Barriers to Discharge: No SNF bed  Expected Discharge Plan and Services Expected Discharge Plan: [place] Choice: [place] arrangements for the past 2 months: [place] Expected Discharge Date: 04/10/21                                     Social Determinants of Health (SDOH) Interventions    Readmission Risk Interventions No flowsheet data found.

## 2021-04-11 NOTE — Care Management Important Message (Signed)
Important Message  Patient Details  Name: [name] MRN: [id number] Date of Birth: 09/29/1925   Medicare Important Message Given:  Yes     Rudi Bunyard P [place] 04/11/2021, 10:59 AM

## 2021-04-11 NOTE — TOC Transition Note (Signed)
Transition of Care [facility]) - CM/SW Discharge Note   Patient Details  Name: [name] MRN: [id number] Date of Birth: 05/28/1925  Transition of Care [facility]) CM/SW Contact:  Milinda Antis, [place] Phone Number: 04/11/2021, 9:49 AM   Clinical Narrative:     Patient will DC to: [facility] Anticipated DC date: 4.27.2022 Family notified: Yes Transport by: Corey Harold   Per MD patient ready for DC to SNF. RN to call report prior to discharge [phone]. RN, patient, patient's family, and facility notified of DC. Discharge Summary and FL2 sent to facility. DC packet on chart. Ambulance transport requested for patient.   CSW will sign off for now as social work intervention is no longer needed. Please consult [place] again if new needs arise.    Final next level of care: Skilled Nursing Facility Barriers to Discharge: Barriers Resolved   Patient Goals and CMS Choice Patient states their goals for this hospitalization and ongoing recovery are:: Pt is unable to participate in goal setting due to disorientation. CMS Medicare.gov Compare Post Acute Care list provided to:: Patient Represenative (must comment) (adult children) Choice offered to / list presented to : Adult Children  Discharge Placement   Existing PASRR number confirmed : 04/11/21            Patient to be transferred to facility by: [place] Name of family member notified: TAYLER, LASSEN     [phone] Patient and family notified of of transfer: 04/11/21  Discharge Plan and Services     Post Acute Care Choice: [place]                               Social Determinants of Health (SDOH) Interventions     Readmission Risk Interventions No flowsheet data found.

## 2021-04-11 NOTE — Progress Notes (Signed)
Patient discharging SNF.Called to give report to the admitting nurse, no answerer. Will attempt again.

## 2021-11-30 ENCOUNTER — Ambulatory Visit: Payer: Medicare Other | Admitting: Cardiology

## 2021-12-19 ENCOUNTER — Ambulatory Visit: Payer: Medicare Other | Admitting: Cardiology

## 2022-02-26 ENCOUNTER — Ambulatory Visit: Payer: Medicare Other | Admitting: Cardiology

## 2022-04-04 ENCOUNTER — Ambulatory Visit: Payer: Medicare Other | Admitting: Cardiology

## 2022-04-14 NOTE — Progress Notes (Deleted)
Cardiology Office Note:    Date:  04/14/2022   ID:  [name], DOB [birth year], MRN [id number]  PCP:  [facility]  Cardiologist:  Shirlee More, MD    Referring MD: Florina Ou*    ASSESSMENT:    No diagnosis found. PLAN:    In order of problems listed above:  ***   Next appointment: ***   Medication Adjustments/Labs and Tests Ordered: Current medicines are reviewed at length with the patient today.  Concerns regarding medicines are outlined above.  No orders of the defined types were placed in this encounter.  No orders of the defined types were placed in this encounter.   No chief complaint on file.   History of Present Illness:    [name] is a 88 y.o. male with a hx of  chronic systolic heart failure ejection fraction 20-25%, hypertensive heart disease permanent atrial fibrillation chronic anticoagulation stage IIIa CKD hyperlipidemia and CAD last seen 03/27/2021. Compliance with diet, lifestyle and medications: *** Past Medical History:  Diagnosis Date   Anaphylactic reaction 06/02/2016   APPENDECTOMY, HX OF 02/25/2008   Qualifier: Diagnosis of  By: Danny Lawless CMA, [place]     Bilateral carpal tunnel syndrome 08/07/2016   Bilateral hearing loss due to cerumen impaction 04/02/2018   Bilateral impacted cerumen 10/27/2018   Bilateral leg weakness 01/25/2019   BPH (benign prostatic hyperplasia)    BPH with obstruction/lower urinary tract symptoms 02/25/2008   Qualifier: Diagnosis of  By: [place], [place]     CARPAL TUNNEL RELEASE, RIGHT, HX OF 02/25/2008   Qualifier: Diagnosis of  By: Danny Lawless CMA, [place]     Chronic sinus bradycardia    Chronic systolic (congestive) heart failure (HCC)    CKD (chronic kidney disease) stage 3, GFR 30-59 ml/min ([place]) 06/02/2016   CKD (chronic kidney disease), stage III ([place])    Coronary artery disease    Coronary artery disease involving native coronary artery of native heart with angina  pectoris ([place]) 02/25/2008   Qualifier: Diagnosis of  By: Danny Lawless CMA, [place]     Cough 11/03/2008   Qualifier: Diagnosis of  By: Linda Hedges MD, Heinz Knuckles    DEGENERATIVE JOINT DISEASE, CERVICAL SPINE 02/25/2008   Qualifier: Diagnosis of  By: Danny Lawless [place], [place]     Diverticulosis    DIVERTICULOSIS, COLON 02/25/2008   Qualifier: Diagnosis of  By: [place], [place]     GERD 02/25/2008   Qualifier: Diagnosis of  By: Danny Lawless CMA, [place]     GERD (gastroesophageal reflux disease)    Gross hematuria 01/28/2017   Hip dislocation 01/31/2012   HLD (hyperlipidemia) 02/25/2008   Qualifier: Diagnosis of  By: [place], [place]     Hypercholesterolemia    HYPERGLYCEMIA 02/25/2008   Qualifier: History of  By: Danny Lawless [place], [place]     Hypertension    Hypertensive heart disease with heart failure ([place]) 02/25/2008   Qualifier: Diagnosis of  By: Danny Lawless CMA, [place]     IBS (irritable bowel syndrome)    Ischemic cardiomyopathy 09/21/2018   Kidney stone    Left hip pain    Chronic left hip discomfort   Lipoma of back 08/06/2016   Mechanical instability of hip prosthesis ([place]) 09/06/2015   MYOCARDIAL INFARCTION 02/25/2008   Qualifier: History of  By: [place], [place]     Myocardial infarction ([place]) 2001   Previous anterolateral myocardial infarction with presistent ST-T wave changes   NEPHROLITHIASIS, HX OF 02/25/2008   Qualifier: Diagnosis of  By:  [place] CMA, [place]     Osteoarthritis    Paroxysmal atrial fibrillation ([place]) 09/21/2018   PERCUTANEOUS TRANSLUMINAL CORONARY ANGIOPLASTY, HX OF 02/25/2008   Qualifier: Diagnosis of  By: [place], [place]     Primary osteoarthritis of both first carpometacarpal joints 08/07/2016   Primary osteoarthritis of both hands 08/07/2016   Renal calculus 06/30/2013   Sinus bradycardia 11/14/2014   Sleep apnea    uses cpap   SVT (supraventricular tachycardia) ([place])    TRIGGER FINGER 02/25/2008   Annotation: right index finger Qualifier: History of  By: [place], [place]      Tubular adenoma    TUBULOVILLOUS ADENOMA, COLON 02/25/2008   Qualifier: History of  By: [place], [place]     Unspecified personal history presenting hazards to health 02/25/2008   Centricity Description: POLYPECTOMY, HX OF Qualifier: Diagnosis of  By: [place], [place]   Centricity Description: PROSTATECTOMY, TRANSURETHRAL, HX OF Qualifier: Diagnosis of  By: [place], [place]      Past Surgical History:  Procedure Laterality Date   ANGIOPLASTY  2002   with stent and angioplasty in 2003   [place]  05/07/2002   Ejection fraction 20%   CARPAL TUNNEL RELEASE Left 08/29/2016   Procedure: LEFT CARPAL TUNNEL RELEASE;  Surgeon: [name], MD;  Location: [place];  Service: Orthopedics;  Laterality: Left;  REG/FAB   FINGER SURGERY     HIP CLOSED REDUCTION  01/31/2012   Procedure: CLOSED MANIPULATION HIP;  Surgeon: [name], MD;  Location: [facility];  Service: Orthopedics;  Laterality: Right;  closed reduction right dislocated hip   HIP CLOSED REDUCTION  10/10/2012   Procedure: CLOSED MANIPULATION HIP;  Surgeon: [name], MD;  Location: [facility];  Service: Orthopedics;  Laterality: Right;   HIP CLOSED REDUCTION Right 03/17/2015   Procedure: CLOSED REDUCTION HIP;  Surgeon: Rod Can, MD;  Location: [facility];  Service: Orthopedics;  Laterality: Right;   JOINT REPLACEMENT     BILATERAL HIP REPLACEMENTS   TONSILLECTOMY     TOTAL HIP REVISION Right 09/06/2015   Procedure: RIGHT TOTAL HIP ACETABULAR LINER REVISION (CONSTRAINED LINER);  Surgeon: [name], MD;  Location[facility];  Service: Orthopedics;  Laterality: Right;   TRANSURETHRAL RESECTION OF PROSTATE      Current Medications: No outpatient medications have been marked as taking for the 04/15/22 encounter (Appointment) with Richardo Priest, MD.     Allergies:   Beef-derived products, Acetaminophen, Alpha-gal, Amiodarone, Prilosec [omeprazole], and Prednisone    Social History   Socioeconomic History   Marital status: Married    Spouse name: Not on file   Number of children: Not on file   Years of education: Not on file   Highest education level: Not on file  Occupational History   Not on file  Tobacco Use   Smoking status: Never   Smokeless tobacco: Never  Vaping Use   Vaping Use: Never used  Substance and Sexual Activity   Alcohol use: No   Drug use: No   Sexual activity: Never  Other Topics Concern   Not on file  Social History Narrative   Not on file   Social Determinants of Health   Financial Resource Strain: Not on file  Food Insecurity: Not on file  Transportation Needs: Not on file  Physical Activity: Not on file  Stress: Not on file  Social Connections: Not on file  Family History: The patient's ***family history includes Alzheimer's disease in his mother; Cancer in his sister; Heart attack in his father; Stroke in his father and mother. ROS:   Please see the history of present illness.    All other systems reviewed and are negative.  EKGs/Labs/Other Studies Reviewed:    The following studies were reviewed today:  EKG:  EKG ordered today and personally reviewed.  The ekg ordered today demonstrates ***  Recent Labs: No results found for requested labs within last 8760 hours.  Recent Lipid Panel    Component Value Date/Time   CHOL 245 (H) 03/27/2021 1102   TRIG 133 03/27/2021 1102   HDL 53 03/27/2021 1102   CHOLHDL 4.6 03/27/2021 1102   CHOLHDL 2.5 01/15/2016 1104   VLDL 13 01/15/2016 1104   LDLCALC 168 (H) 03/27/2021 1102    Physical Exam:    VS:  There were no vitals taken for this visit.    Wt Readings from Last 3 Encounters:  03/27/21 167 lb (75.8 kg)  11/28/20 165 lb (74.8 kg)  08/30/20 157 lb (71.2 kg)     GEN: *** Well nourished, well developed in no acute distress HEENT: Normal NECK: No JVD; No carotid bruits LYMPHATICS: No lymphadenopathy CARDIAC: ***RRR, no murmurs, rubs,  gallops RESPIRATORY:  Clear to auscultation without rales, wheezing or rhonchi  ABDOMEN: Soft, non-tender, non-distended MUSCULOSKELETAL:  No edema; No deformity  SKIN: Warm and dry NEUROLOGIC:  Alert and oriented x 3 PSYCHIATRIC:  Normal affect    Signed, Shirlee More, MD  04/14/2022 11:22 AM    [facility]

## 2022-04-15 ENCOUNTER — Ambulatory Visit: Payer: Medicare Other | Admitting: Cardiology

## 2022-05-30 ENCOUNTER — Ambulatory Visit: Payer: Medicare Other | Admitting: Cardiology

## 2022-07-26 ENCOUNTER — Ambulatory Visit: Payer: Medicare Other | Admitting: Cardiology

## 2022-08-04 NOTE — Progress Notes (Unsigned)
Office Visit    Patient Name: [name] Date of Encounter: 08/05/2022  PCP:  [place]  Cardiologist:  Shirlee More, MD  Advanced Practice Provider:  No care team member to display Electrophysiologist:  None   HPI    [name] is a 88 y.o. male with a past medical history significant for chronic systolic CHF (ejection fraction 20 to 25%), hypertension, permanent atrial fibrillation, chronic anticoagulation, stage IIIb CKD, coronary artery disease presents today for 57-monthfollow-up appointment.  History includes office appointment 11/2020 where he was moving into an assisted living facility.  Compliant with diet, lifestyle, and medications.  He was last seen 03/2021 and had unchanged weight with no shortness of breath or chest pain.  Denies palpitations or syncope.  He was pleased with his quality of life.  Today, he is doing pretty well. He has some dementia and some short-term memory loss. His daughter is present today and was a CMA who worked for Dr. BMare Ferrari  He has not had any falls this year but did have a fall last year.  In fact, his wife and him fell 3 days apart and each broke their hip.  They have been married since they were 88years old. 782years of marriage!  I reviewed his vitals from the facility as well as his most recent labs.  We reviewed all of his medications and updated our list.  Overall, things have been stable.  He stays in atrial fibrillation but is rate controlled.  No issues with his anticoagulation.  Reports no shortness of breath nor dyspnea on exertion. Reports no chest pain, pressure, or tightness. No edema, orthopnea, PND. Reports no palpitations.             Past Medical History    Past Medical History:  Diagnosis Date   Anaphylactic reaction 06/02/2016   APPENDECTOMY, HX OF 02/25/2008   Qualifier: Diagnosis of  By: SDanny LawlessCMA, [place]    Bilateral carpal tunnel syndrome  08/07/2016   Bilateral hearing loss due to cerumen impaction 04/02/2018   Bilateral impacted cerumen 10/27/2018   Bilateral leg weakness 01/25/2019   BPH (benign prostatic hyperplasia)    BPH with obstruction/lower urinary tract symptoms 02/25/2008   Qualifier: Diagnosis of  By: SDanny LawlessCMA, [place]    CARPAL TUNNEL RELEASE, RIGHT, HX OF 02/25/2008   Qualifier: Diagnosis of  By: SDanny LawlessCMA, [place]    Chronic sinus bradycardia    Chronic systolic (congestive) heart failure (HCC)    CKD (chronic kidney disease) stage 3, GFR 30-59 ml/min ([place] 06/02/2016   CKD (chronic kidney disease), stage III (HCC)    Coronary artery disease    Coronary artery disease involving native coronary artery of native heart with angina pectoris ([place] 02/25/2008   Qualifier: Diagnosis of  By: SDanny LawlessCMA, [place]    Cough 11/03/2008   Qualifier: Diagnosis of  By: NLinda HedgesMD, MHeinz Knuckles   DEGENERATIVE JOINT DISEASE, CERVICAL SPINE 02/25/2008   Qualifier: Diagnosis of  By: [place]    Diverticulosis    DIVERTICULOSIS, COLON 02/25/2008   Qualifier: Diagnosis of  By: [place]    GERD 02/25/2008   Qualifier: Diagnosis of  By: SDanny LawlessCMA, [place]    GERD (gastroesophageal reflux disease)    Gross hematuria 01/28/2017   Hip dislocation 01/31/2012   HLD (hyperlipidemia) 02/25/2008   Qualifier: Diagnosis of  By: SDanny Lawless  [place], [place]     Hypercholesterolemia    HYPERGLYCEMIA 02/25/2008   Qualifier: History of  By: [place], [place]     Hypertension    Hypertensive heart disease with heart failure ([place]) 02/25/2008   Qualifier: Diagnosis of  By: Danny Lawless CMA, [place]     IBS (irritable bowel syndrome)    Ischemic cardiomyopathy 09/21/2018   Kidney stone    Left hip pain    Chronic left hip discomfort   Lipoma of back 08/06/2016   Mechanical instability of hip prosthesis ([place]) 09/06/2015   MYOCARDIAL INFARCTION 02/25/2008   Qualifier: History of  By: [place], [place]     Myocardial infarction ([place])  2001   Previous anterolateral myocardial infarction with presistent ST-T wave changes   NEPHROLITHIASIS, HX OF 02/25/2008   Qualifier: Diagnosis of  By: Danny Lawless CMA, [place]     Osteoarthritis    Paroxysmal atrial fibrillation ([place]) 09/21/2018   PERCUTANEOUS TRANSLUMINAL CORONARY ANGIOPLASTY, HX OF 02/25/2008   Qualifier: Diagnosis of  By: Danny Lawless CMA, [place]     Primary osteoarthritis of both first carpometacarpal joints 08/07/2016   Primary osteoarthritis of both hands 08/07/2016   Renal calculus 06/30/2013   Sinus bradycardia 11/14/2014   Sleep apnea    uses cpap   SVT (supraventricular tachycardia) ([place])    TRIGGER FINGER 02/25/2008   Annotation: right index finger Qualifier: History of  By: [place], [place]     Tubular adenoma    TUBULOVILLOUS ADENOMA, COLON 02/25/2008   Qualifier: History of  By: [place], [place]     Unspecified personal history presenting hazards to health 02/25/2008   Centricity Description: POLYPECTOMY, HX OF Qualifier: Diagnosis of  By: [place], [place]   Centricity Description: PROSTATECTOMY, TRANSURETHRAL, HX OF Qualifier: Diagnosis of  By: [place], [place]     Past Surgical History:  Procedure Laterality Date   ANGIOPLASTY  2002   with stent and angioplasty in 2003   [place]  05/07/2002   Ejection fraction 20%   CARPAL TUNNEL RELEASE Left 08/29/2016   Procedure: LEFT CARPAL TUNNEL RELEASE;  Surgeon: [name], MD;  Location: [place];  Service: Orthopedics;  Laterality: Left;  REG/FAB   FINGER SURGERY     HIP CLOSED REDUCTION  01/31/2012   Procedure: CLOSED MANIPULATION HIP;  Surgeon: [name], MD;  [facility];  Service: Orthopedics;  Laterality: Right;  closed reduction right dislocated hip   HIP CLOSED REDUCTION  10/10/2012   Procedure: CLOSED MANIPULATION HIP;  Surgeon: [name], MD;  [facility];  Service: Orthopedics;  Laterality: Right;   HIP CLOSED  REDUCTION Right 03/17/2015   Procedure: CLOSED REDUCTION HIP;  Surgeon: Rod Can, MD;  [facility];  Service: Orthopedics;  Laterality: Right;   JOINT REPLACEMENT     BILATERAL HIP REPLACEMENTS   TONSILLECTOMY     TOTAL HIP REVISION Right 09/06/2015   Procedure: RIGHT TOTAL HIP ACETABULAR LINER REVISION (CONSTRAINED LINER);  Surgeon: [name], MD;  [facility];  Service: Orthopedics;  Laterality: Right;   TRANSURETHRAL RESECTION OF PROSTATE      Allergies  Allergies  Allergen Reactions   Beef-Derived Products Hives    Severe hives, nausea, throat swelling   Acetaminophen Other (See Comments)    urticaria urticaria   Alpha-Gal     Per mar   Amiodarone Other (See Comments)    Unknown  Affected my breathing  Prilosec [Omeprazole]     Diarrhea, stomach cramps   Prednisone Palpitations    REACTION: causes tachycardia, increase BP   EKGs/Labs/Other Studies Reviewed:   The following studies were reviewed today:  Echocardiogram (external report December 2016)  Mild to moderate MR, mild to moderate AI, moderate TR moderate left atrial enlargement, ejection fraction 30%  EKG:  EKG is ordered today.  The ekg ordered today demonstrates atrial fibrillation, 74 bpm  Recent Labs: No results found for requested labs within last 365 days.  Recent Lipid Panel    Component Value Date/Time   CHOL 245 (H) 03/27/2021 1102   TRIG 133 03/27/2021 1102   HDL 53 03/27/2021 1102   CHOLHDL 4.6 03/27/2021 1102   CHOLHDL 2.5 01/15/2016 1104   VLDL 13 01/15/2016 1104   LDLCALC 168 (H) 03/27/2021 1102    Risk Assessment/Calculations:   CHA2DS2-VASc Score =     This indicates a  % annual risk of stroke. The patient's score is based upon:       Home Medications   Current Meds  Medication Sig   acetaminophen (TYLENOL) 500 MG tablet Take 250 mg by mouth every 4 (four) hours as needed for mild pain.   calcium carbonate (TUMS - DOSED IN MG ELEMENTAL CALCIUM) 500 MG  chewable tablet Chew 1 tablet by mouth daily.   Cholecalciferol (VITAMIN D-3) 125 MCG (5000 UT) TABS Take by mouth daily.   Dextromethorphan HBr 10 MG/5ML SYRP Take 5 mLs by mouth every 4 (four) hours as needed (cough/elevated temp).   Diclofenac Sodium (ASPERCREME ARTHRITIS PAIN EX) Apply topically as needed (pain).   diclofenac Sodium (VOLTAREN ARTHRITIS PAIN) 1 % GEL Apply topically as needed (pain).   donepezil (ARICEPT) 10 MG tablet Take 10 mg by mouth at bedtime.   ELIQUIS 2.5 MG TABS tablet TAKE 1 TABLET BY MOUTH TWICE A DAY   escitalopram (LEXAPRO) 10 MG tablet Take 10 mg by mouth daily.   ferrous sulfate 325 (65 FE) MG tablet Take 325 mg by mouth 2 (two) times daily with a meal.    gabapentin (NEURONTIN) 100 MG capsule Take 100 mg by mouth daily.   loperamide (IMODIUM A-D) 2 MG tablet Take 2 mg by mouth every 4 (four) hours as needed for diarrhea or loose stools.   LORazepam (ATIVAN) 0.5 MG tablet Take 1 tablet (0.5 mg total) by mouth 2 (two) times daily.   metoprolol succinate (TOPROL-XL) 25 MG 24 hr tablet Take 25 mg by mouth daily.   oxyCODONE (ROXICODONE) 5 MG immediate release tablet Take 1 tablet (5 mg total) by mouth every 6 (six) hours as needed for severe pain.   polyethylene glycol (MIRALAX / GLYCOLAX) 17 g packet Take 17 g by mouth daily as needed for mild constipation.   sacubitril-valsartan (ENTRESTO) 49-51 MG Take 1 tablet by mouth 2 (two) times daily. (Patient taking differently: Take 1 tablet by mouth daily.)   senna (SENOKOT) 8.6 MG TABS tablet Take 1 tablet (8.6 mg total) by mouth 2 (two) times daily.   tamsulosin (FLOMAX) 0.4 MG CAPS capsule Take 0.4 mg by mouth daily.   torsemide (DEMADEX) 20 MG tablet Take 1 tablet (20 mg total) by mouth daily.     Review of Systems      All other systems reviewed and are otherwise negative except as noted above.  Physical Exam    VS:  BP 118/68   Pulse 74   Ht '5\' 5"'$  (1.651 m)   Wt 150 lb (68 kg)  SpO2 95%   BMI 24.96  kg/m  , BMI Body mass index is 24.96 kg/m.  Wt Readings from Last 3 Encounters:  08/05/22 150 lb (68 kg)  03/27/21 167 lb (75.8 kg)  11/28/20 165 lb (74.8 kg)     GEN: Well nourished, well developed, in no acute distress. HEENT: normal. Neck: Supple, no JVD, carotid bruits, or masses. Cardiac: RRR, no murmurs, rubs, or gallops. No clubbing, cyanosis, edema.  Radials/PT 2+ and equal bilaterally.  Respiratory:  Respirations regular and unlabored, clear to auscultation bilaterally. GI: Soft, nontender, nondistended. MS: No deformity or atrophy. Skin: Warm and dry, no rash. Neuro:  Strength and sensation are intact. Psych: Normal affect.  Assessment & Plan    Chronic diastolic CHF -Euvolemic on exam -Continue current medication regimen which includes Eliquis 2.5 mg twice a day, metoprolol XL 25 mg daily, Entresto 49-51 mg twice daily, Demadex 20 mg daily  Hypertension -Blood pressure well controlled today -Continue current blood pressure regimen  Permanent atrial fibrillation -rate controlled today -continue Eliquis 2.5 mg BID  Chronic anticoagulation -no bleeding  Stage IIIb chronic kidney disease -Reviewed recent labs.  Creatinine 1.5.  It appears his baseline is right around 1.4  CAD -no chest pain or SOB         Disposition: Follow up 1 year with Shirlee More, MD or APP.  Signed, Elgie Collard, PA-C 08/05/2022, 3:46 PM [facility]

## 2022-08-05 ENCOUNTER — Encounter: Payer: Self-pay | Admitting: Physician Assistant

## 2022-08-05 ENCOUNTER — Ambulatory Visit (INDEPENDENT_AMBULATORY_CARE_PROVIDER_SITE_OTHER): Payer: Medicare Other | Admitting: Physician Assistant

## 2022-08-05 VITALS — BP 118/68 | HR 74 | Ht 65.0 in | Wt 150.0 lb

## 2022-08-05 DIAGNOSIS — I5043 Acute on chronic combined systolic (congestive) and diastolic (congestive) heart failure: Secondary | ICD-10-CM

## 2022-08-05 DIAGNOSIS — I2583 Coronary atherosclerosis due to lipid rich plaque: Secondary | ICD-10-CM | POA: Diagnosis not present

## 2022-08-05 DIAGNOSIS — I2510 Atherosclerotic heart disease of native coronary artery without angina pectoris: Secondary | ICD-10-CM | POA: Diagnosis not present

## 2022-08-05 DIAGNOSIS — E785 Hyperlipidemia, unspecified: Secondary | ICD-10-CM | POA: Diagnosis not present

## 2022-08-05 DIAGNOSIS — I4821 Permanent atrial fibrillation: Secondary | ICD-10-CM

## 2022-08-05 DIAGNOSIS — N1832 Chronic kidney disease, stage 3b: Secondary | ICD-10-CM | POA: Diagnosis not present

## 2022-08-05 DIAGNOSIS — I10 Essential (primary) hypertension: Secondary | ICD-10-CM | POA: Diagnosis not present

## 2022-08-05 NOTE — Patient Instructions (Addendum)
Medication Instructions:  Your physician recommends that you continue on your current medications as directed. Please refer to the Current Medication list given to you today.  *If you need a refill on your cardiac medications before your next appointment, please call your pharmacy*   Lab Work: None If you have labs (blood work) drawn today and your tests are completely normal, you will receive your results only by: [place] (if you have MyChart) OR A paper copy in the mail If you have any lab test that is abnormal or we need to change your treatment, we will call you to review the results.  Follow-Up: At [facility], you and your health needs are our priority.  As part of our continuing mission to provide you with exceptional heart care, we have created designated Provider Care Teams.  These Care Teams include your primary Cardiologist (physician) and Advanced Practice Providers (APPs -  Physician Assistants and Nurse Practitioners) who all work together to provide you with the care you need, when you need it.  Your next appointment:   1 year  The format for your next appointment:   In Person  Provider:  Nicholes Rough, PA-C  Important Information About Sugar

## 2023-06-30 ENCOUNTER — Emergency Department (HOSPITAL_COMMUNITY): Payer: Medicare Other

## 2023-06-30 ENCOUNTER — Emergency Department (HOSPITAL_COMMUNITY)
Admission: EM | Admit: 2023-06-30 | Discharge: 2023-07-01 | Disposition: A | Discharge status: Home / Self Care | Payer: Medicare Other | Attending: Emergency Medicine | Admitting: Emergency Medicine

## 2023-06-30 ENCOUNTER — Other Ambulatory Visit: Payer: Self-pay

## 2023-06-30 ENCOUNTER — Encounter (HOSPITAL_COMMUNITY): Payer: Self-pay

## 2023-06-30 DIAGNOSIS — I509 Heart failure, unspecified: Secondary | ICD-10-CM | POA: Diagnosis not present

## 2023-06-30 DIAGNOSIS — R600 Localized edema: Secondary | ICD-10-CM | POA: Insufficient documentation

## 2023-06-30 DIAGNOSIS — R0600 Dyspnea, unspecified: Secondary | ICD-10-CM

## 2023-06-30 DIAGNOSIS — R0602 Shortness of breath: Secondary | ICD-10-CM | POA: Diagnosis present

## 2023-06-30 DIAGNOSIS — N189 Chronic kidney disease, unspecified: Secondary | ICD-10-CM | POA: Diagnosis not present

## 2023-06-30 DIAGNOSIS — Z7901 Long term (current) use of anticoagulants: Secondary | ICD-10-CM | POA: Diagnosis not present

## 2023-06-30 DIAGNOSIS — I2510 Atherosclerotic heart disease of native coronary artery without angina pectoris: Secondary | ICD-10-CM | POA: Diagnosis not present

## 2023-06-30 LAB — CBC
HCT: 37.1 % — ABNORMAL LOW (ref 39.0–52.0)
Hemoglobin: 11.6 g/dL — ABNORMAL LOW (ref 13.0–17.0)
MCH: 29.3 pg (ref 26.0–34.0)
MCHC: 31.3 g/dL (ref 30.0–36.0)
MCV: 93.7 fL (ref 80.0–100.0)
Platelets: 160 10*3/uL (ref 150–400)
RBC: 3.96 MIL/uL — ABNORMAL LOW (ref 4.22–5.81)
RDW: 16.7 % — ABNORMAL HIGH (ref 11.5–15.5)
WBC: 5.7 10*3/uL (ref 4.0–10.5)
nRBC: 0 % (ref 0.0–0.2)

## 2023-06-30 LAB — BASIC METABOLIC PANEL
Anion gap: 12 (ref 5–15)
BUN: 33 mg/dL — ABNORMAL HIGH (ref 8–23)
CO2: 21 mmol/L — ABNORMAL LOW (ref 22–32)
Calcium: 9.6 mg/dL (ref 8.9–10.3)
Chloride: 100 mmol/L (ref 98–111)
Creatinine, Ser: 1.58 mg/dL — ABNORMAL HIGH (ref 0.61–1.24)
GFR, Estimated: 40 mL/min — ABNORMAL LOW (ref >60)
Glucose, Bld: 102 mg/dL — ABNORMAL HIGH (ref 70–99)
Potassium: 4.9 mmol/L (ref 3.5–5.1)
Sodium: 133 mmol/L — ABNORMAL LOW (ref 135–145)

## 2023-06-30 LAB — PROTIME-INR
INR: 1.4 — ABNORMAL HIGH (ref 0.8–1.2)
Prothrombin Time: 17.3 seconds — ABNORMAL HIGH (ref 11.4–15.2)

## 2023-06-30 LAB — BRAIN NATRIURETIC PEPTIDE: B Natriuretic Peptide: 1922.7 pg/mL — ABNORMAL HIGH (ref 0.0–100.0)

## 2023-06-30 LAB — TROPONIN I (HIGH SENSITIVITY)
Troponin I (High Sensitivity): 72 ng/L — ABNORMAL HIGH (ref <18)
Troponin I (High Sensitivity): 67 ng/L — ABNORMAL HIGH (ref <18)

## 2023-06-30 MED ORDER — FUROSEMIDE 10 MG/ML IJ SOLN
60.0000 mg | Freq: Once | INTRAMUSCULAR | Status: AC
  Administered 2023-07-01: 60 mg via INTRAVENOUS
  Filled 2023-06-30: qty 6

## 2023-06-30 NOTE — ED Triage Notes (Addendum)
Patient BIB [facility] from [facility] with complaint of fluid overload.   Per facility patient has gain 5lbs in 2 days.  RA 97%  A&Ox 2 at baseline

## 2023-06-30 NOTE — ED Notes (Signed)
Patient was helped to the restroom, had a small bowel movement, helped back to bed, changed him into a diaper.

## 2023-06-30 NOTE — ED Provider Notes (Signed)
[place] EMERGENCY DEPARTMENT AT [facility] Provider Note   CSN: [id number] Arrival date & time: 06/30/23  1954     History  Chief Complaint  Patient presents with   Shortness of Breath         [name] is a 89 y.o. male.  89 year old male with a history of dementia typically alert and oriented to self only, CAD, CKD, atrial fibrillation on Eliquis, heart failure, and CVA who presents to [facility] emergency department with fluid retention.  History obtained per [facility] who states in Charles past 2 days [name] has gained 5 pounds.  Additional history obtained per [name] who reports that [name] has been retaining fluids for some time despite taking [name] torsemide.  They called Charles on-call doctor who recommended him come to [facility] hospital for IV Lasix.  Thinks [name] oxygen may have been low.  Denies any cough or fevers recently.  Says [name] is alert and oriented x 1 at baseline. Per staff [name] was swollen all over and desatted. Still has been taking torsemide Tuesdays and Thursdays.        Home Medications Prior to Admission medications   Medication Sig Start Date End Date Taking? Authorizing Provider  acetaminophen (TYLENOL) 500 MG tablet Take 250 mg by mouth every 4 (four) hours as needed for mild pain.    [provider]  calcium carbonate (TUMS - DOSED IN MG ELEMENTAL CALCIUM) 500 MG chewable tablet Chew 1 tablet by mouth daily.    [provider]  Cholecalciferol (VITAMIN D-3) 125 MCG (5000 UT) TABS Take by mouth daily.    [provider]  Dextromethorphan HBr 10 MG/5ML SYRP Take 5 mLs by mouth every 4 (four) hours as needed (cough/elevated temp).    [provider]  Diclofenac Sodium (ASPERCREME ARTHRITIS PAIN EX) Apply topically as needed (pain).    [provider]  diclofenac Sodium (VOLTAREN ARTHRITIS PAIN) 1 % GEL Apply topically as needed (pain).    [provider]  donepezil (ARICEPT) 10 MG tablet Take 10 mg by  mouth at bedtime.    [provider]  ELIQUIS 2.5 MG TABS tablet TAKE 1 TABLET BY MOUTH TWICE A DAY 08/15/20   Baldo Daub, MD  escitalopram (LEXAPRO) 10 MG tablet Take 10 mg by mouth daily. 04/01/21   [provider]  ferrous sulfate 325 (65 FE) MG tablet Take 325 mg by mouth 2 (two) times daily with a meal.     [provider]  gabapentin (NEURONTIN) 100 MG capsule Take 100 mg by mouth daily.    [provider]  loperamide (IMODIUM A-D) 2 MG tablet Take 2 mg by mouth every 4 (four) hours as needed for diarrhea or loose stools.    [provider]  LORazepam (ATIVAN) 0.5 MG tablet Take 1 tablet (0.5 mg total) by mouth 2 (two) times daily. 04/10/21   Burnadette Pop, MD  metoprolol succinate (TOPROL-XL) 25 MG 24 hr tablet Take 25 mg by mouth daily. 03/09/21   [provider]  oxyCODONE (ROXICODONE) 5 MG immediate release tablet Take 1 tablet (5 mg total) by mouth every 6 (six) hours as needed for severe pain. 04/10/21   Burnadette Pop, MD  polyethylene glycol (MIRALAX / GLYCOLAX) 17 g packet Take 17 g by mouth daily as needed for mild constipation. 04/10/21   Burnadette Pop, MD  sacubitril-valsartan (ENTRESTO) 49-51 MG Take 1 tablet by mouth 2 (two) times daily. Patient taking differently: Take 1  tablet by mouth daily. 06/14/20   Baldo Daub, MD  senna (SENOKOT) 8.6 MG TABS tablet Take 1 tablet (8.6 mg total) by mouth 2 (two) times daily. 04/10/21   Burnadette Pop, MD  tamsulosin (FLOMAX) 0.4 MG CAPS capsule Take 0.4 mg by mouth daily. 10/11/19   [provider]  torsemide (DEMADEX) 20 MG tablet Take 1 tablet (20 mg total) by mouth daily. 04/13/20   Georgeanna Lea, MD      Allergies    Beef-derived products, Acetaminophen, Alpha-gal, Amiodarone, Prilosec [omeprazole], and Prednisone    Review of Systems   Review of Systems  Physical Exam Updated Vital Signs BP 119/78   Pulse 86   Temp (!) 97.5 F (36.4 C) (Oral)   Resp  16   Ht 5\' 5"  (1.651 m)   Wt 68 kg   SpO2 100%   BMI 24.95 kg/m  Physical Exam Vitals and nursing note reviewed.  Constitutional:      General: [name] is not in acute distress.    Appearance: [name] is well-developed.  HENT:     Head: Normocephalic and atraumatic.     Right Ear: External ear normal.     Left Ear: External ear normal.     Nose: Nose normal.  Eyes:     Extraocular Movements: Extraocular movements intact.     Conjunctiva/sclera: Conjunctivae normal.     Pupils: Pupils are equal, round, and reactive to light.  Cardiovascular:     Rate and Rhythm: Normal rate and regular rhythm.     Heart sounds: Normal heart sounds.  Pulmonary:     Effort: Pulmonary effort is normal. No respiratory distress.     Comments: Satting well on room air.  Bibasilar rales. Musculoskeletal:     Cervical back: Normal range of motion and neck supple.     Right lower leg: Edema present.     Left lower leg: Edema present.  Skin:    General: Skin is warm and dry.  Neurological:     Mental Status: [name] is alert. Mental status is at baseline.  Psychiatric:        Mood and Affect: Mood normal.        Behavior: Behavior normal.     ED Results / Procedures / Treatments   Labs (all labs ordered are listed, but only abnormal results are displayed) Labs Reviewed  BASIC METABOLIC PANEL - Abnormal; Notable for Charles following components:      Result Value   Sodium 133 (*)    CO2 21 (*)    Glucose, Bld 102 (*)    BUN 33 (*)    Creatinine, Ser 1.58 (*)    GFR, Estimated 40 (*)    All other components within normal limits  CBC - Abnormal; Notable for Charles following components:   RBC 3.96 (*)    Hemoglobin 11.6 (*)    HCT 37.1 (*)    RDW 16.7 (*)    All other components within normal limits  PROTIME-INR - Abnormal; Notable for Charles following components:   Prothrombin Time 17.3 (*)    INR 1.4 (*)    All other components within normal limits  BRAIN NATRIURETIC PEPTIDE - Abnormal; Notable for Charles  following components:   B Natriuretic Peptide 1,922.7 (*)    All other components within normal limits  TROPONIN I (HIGH SENSITIVITY) - Abnormal; Notable for Charles following components:   Troponin I (High Sensitivity) 72 (*)    All other components within normal limits  TROPONIN I (HIGH SENSITIVITY) - Abnormal; Notable for Charles following components:   Troponin I (High Sensitivity) 67 (*)    All other components within normal limits    EKG EKG Interpretation Date/Time:  Monday June 30 2023 20:04:01 EDT Ventricular Rate:  82 PR Interval:    QRS Duration:  84 QT Interval:  372 QTC Calculation: 434 R Axis:   -50  Text Interpretation: Atrial fibrillation Left axis deviation Abnormal ECG Confirmed by Vonita Moss [phone]) on 06/30/2023 10:28:04 PM  Radiology DG Chest 2 View  Result Date: 06/30/2023 CLINICAL DATA:  Shortness of breath EXAM: CHEST - 2 VIEW COMPARISON:  04/10/2021 FINDINGS: Moderate bilateral pleural effusions and associated airspace opacities. Stable enlargement of Charles cardiomediastinal silhouette. Aortic atherosclerotic calcification. No pneumothorax. No displaced rib fractures. IMPRESSION: Moderate bilateral pleural effusions and associated atelectasis or pneumonia. Electronically Signed   By: Minerva Fester M.D.   On: 06/30/2023 20:28    Procedures Procedures    Medications Ordered in ED Medications  furosemide (LASIX) injection 60 mg (has no administration in time range)    ED Course/ Medical Decision Making/ A&P Clinical Course as of 06/30/23 2353  Mon Jun 30, 2023  2335 Signed out to Dr Bebe Shaggy [RP]    Clinical Course User Index [RP] Rondel Baton, MD                             Medical Decision Making Amount and/or Complexity of Data Reviewed Labs: ordered. Radiology: ordered.  Risk Prescription drug management.   OSEAS DETTY is a 89 y.o. male with comorbidities that complicate Charles patient evaluation including dementia typically alert  and oriented to self only, CAD, CKD, atrial fibrillation on Eliquis, heart failure, and CVA who presents to [facility] emergency department with fluid retention.   Initial Ddx:  CHF exacerbation, AKI/renal dysfunction, DVT, pneumonia  MDM/Course:  Patient presents emergency department with weight gain and lower extremity swelling and low oxygen saturations from this facility.  On exam is in no acute distress.  Appears to be at [name] mental baseline does have some lower extremity swelling and bibasilar Rales but satting well on room air.  Plan: Discussed with [name] and facility is not having any infectious symptoms at this time.  Chest x-ray shows bilateral pleural effusions with BNP of 1900 today.  They did comment on possible atelectasis versus pneumonia but with [name] normal white blood cell count, and Charles fact that [name] is afebrile, and no reports of any infectious symptoms to suggest pneumonia at this time was given IV Lasix in Charles emergency department and is awaiting reassessment by Charles oncoming physician (Dr. Bebe Shaggy).  Feel Charles patient has good response since [name] is satting well on room air and is not in any acute distress should be able to be discharged home.   This patient presents to Charles ED for concern of complaints listed in HPI, this involves an extensive number of treatment options, and is a complaint that carries with it a high risk of complications and morbidity. Disposition including potential need for admission considered.   Dispo: Pending remainder of workup  Additional history obtained from Randolph Records reviewed Outpatient Clinic Notes Charles following labs were independently interpreted: Serial Troponins and show CKD I independently reviewed Charles following imaging with scope of interpretation limited to determining acute life threatening conditions related to emergency care: Chest x-ray and agree with Charles radiologist interpretation with Charles following exceptions: none I  personally reviewed and  interpreted Charles pt's EKG: see above for interpretation  I have reviewed Charles patients home medications and made adjustments as needed Social Determinants of health:  Elderly         Final Clinical Impression(s) / ED Diagnoses Final diagnoses:  Dyspnea, unspecified type  Acute on chronic congestive heart failure, unspecified heart failure type [facility])    Rx / DC Orders ED Discharge Orders     None         Rondel Baton, MD 06/30/23 2354

## 2023-06-30 NOTE — ED Provider Triage Note (Signed)
Emergency Medicine Provider Triage Evaluation Note  [name] , a 89 y.o. male  was evaluated in triage.  Pt complains of shortness of breath.  Review of Systems  Positive: Shortness of breath, leg swelling Negative: Chest pain  Physical Exam  BP 106/79 (BP Location: Right Arm)   Pulse 77   Temp (!) 97.5 F (36.4 C) (Oral)   Resp 16   Ht 5\' 5"  (1.651 m)   SpO2 97%   BMI 24.96 kg/m  Gen:   Awake, no distress   Resp:  Normal effort, mild diffuse wheezing MSK:   Moves extremities without difficulty, mild lower extremity edema Cardiac: RRR  Medical Decision Making  Medically screening exam initiated at 9:29 PM.  Appropriate orders placed.  Demetrios Isaacs Kasper was informed that the remainder of the evaluation will be completed by another provider, this initial triage assessment does not replace that evaluation, and the importance of remaining in the ED until their evaluation is complete.  Likely CHF. Needs room, tele monitoring, and diuresis.   Pricilla Loveless, MD 06/30/23 2130

## 2023-07-01 DIAGNOSIS — I509 Heart failure, unspecified: Secondary | ICD-10-CM | POA: Diagnosis not present

## 2023-07-01 NOTE — ED Notes (Signed)
[facility] at [phone] and gave report to [facility] RN.

## 2023-07-01 NOTE — ED Notes (Signed)
When putting PT on bed pan , noticed the right legs of the pt are swollen and hard.

## 2023-07-01 NOTE — ED Provider Notes (Signed)
I assumed care at signout Pt given lasix and is now diuresing He is no acute distress He will be discharged    Zadie Rhine, MD 07/01/23 [phone]

## 2023-07-01 NOTE — ED Notes (Signed)
ED TO INPATIENT HANDOFF REPORT  ED Nurse Name and Phone #: Sueo Cullen [phone]  S Name/Age/Gender [name] 89 y.o. male Room/Bed: 023C/023C  Code Status   Code Status: DNR  Home/SNF/Other Home Patient oriented to: self Is this baseline? Yes   Triage Complete: Triage complete  Chief Complaint CHF  Triage Note Patient BIB [facility] from [facility] with complaint of fluid overload.   Per facility patient has gain 5lbs in 2 days.  RA 97%  A&Ox 2 at baseline    Allergies Allergies  Allergen Reactions   Beef-Derived Products Hives    Severe hives, nausea, throat swelling   Acetaminophen Other (See Comments)    urticaria urticaria   Alpha-Gal     Per mar   Amiodarone Other (See Comments)    Unknown  Affected my breathing   Prilosec [Omeprazole]     Diarrhea, stomach cramps   Prednisone Palpitations    REACTION: causes tachycardia, increase BP    Level of Care/Admitting Diagnosis ED Disposition     ED Disposition  Discharge   Condition  --   Comment  I, Joya Gaskins, MD, have physically assessed this patient and they appear reasonably screened and/or stabilized for discharge and I doubt any other medical condition or other [facility] requiring further screening, evaluation, or treatment in the ED e xists or is present at this time prior to discharge.          B Medical/Surgery History Past Medical History:  Diagnosis Date   Anaphylactic reaction 06/02/2016   APPENDECTOMY, HX OF 02/25/2008   Qualifier: Diagnosis of  By: Genelle Gather CMA, [place]     Bilateral carpal tunnel syndrome 08/07/2016   Bilateral hearing loss due to cerumen impaction 04/02/2018   Bilateral impacted cerumen 10/27/2018   Bilateral leg weakness 01/25/2019   BPH (benign prostatic hyperplasia)    BPH with obstruction/lower urinary tract symptoms 02/25/2008   Qualifier: Diagnosis of  By: Genelle Gather CMA, [place]     CARPAL TUNNEL RELEASE, RIGHT, HX OF 02/25/2008   Qualifier: Diagnosis of   By: Genelle Gather CMA, [place]     Chronic sinus bradycardia    Chronic systolic (congestive) heart failure (HCC)    CKD (chronic kidney disease) stage 3, GFR 30-59 ml/min (HCC) 06/02/2016   CKD (chronic kidney disease), stage III (HCC)    Coronary artery disease    Coronary artery disease involving native coronary artery of native heart with angina pectoris (HCC) 02/25/2008   Qualifier: Diagnosis of  By: Genelle Gather CMA, [place]     Cough 11/03/2008   Qualifier: Diagnosis of  By: Debby Bud MD, Rosalyn Gess    DEGENERATIVE JOINT DISEASE, CERVICAL SPINE 02/25/2008   Qualifier: Diagnosis of  By: Genelle Gather CMA, [place]     Diverticulosis    DIVERTICULOSIS, COLON 02/25/2008   Qualifier: Diagnosis of  By: Genelle Gather CMA, [place]     GERD 02/25/2008   Qualifier: Diagnosis of  By: Genelle Gather CMA, [place]     GERD (gastroesophageal reflux disease)    Gross hematuria 01/28/2017   Hip dislocation 01/31/2012   HLD (hyperlipidemia) 02/25/2008   Qualifier: Diagnosis of  By: Genelle Gather CMA, [place]     Hypercholesterolemia    HYPERGLYCEMIA 02/25/2008   Qualifier: History of  By: Genelle Gather CMA, [place]     Hypertension    Hypertensive heart disease with heart failure (HCC) 02/25/2008   Qualifier: Diagnosis of  By: Genelle Gather CMA, [place]     IBS (irritable bowel syndrome)    Ischemic cardiomyopathy 09/21/2018  Kidney stone    Left hip pain    Chronic left hip discomfort   Lipoma of back 08/06/2016   Mechanical instability of hip prosthesis (HCC) 09/06/2015   MYOCARDIAL INFARCTION 02/25/2008   Qualifier: History of  By: Genelle Gather CMA, [place]     Myocardial infarction (HCC) 2001   Previous anterolateral myocardial infarction with presistent ST-T wave changes   NEPHROLITHIASIS, HX OF 02/25/2008   Qualifier: Diagnosis of  By: Genelle Gather CMA, [place]     Osteoarthritis    Paroxysmal atrial fibrillation (HCC) 09/21/2018   PERCUTANEOUS TRANSLUMINAL CORONARY ANGIOPLASTY, HX OF 02/25/2008   Qualifier: Diagnosis of  By: Genelle Gather CMA, [place]     Primary  osteoarthritis of both first carpometacarpal joints 08/07/2016   Primary osteoarthritis of both hands 08/07/2016   Renal calculus 06/30/2013   Sinus bradycardia 11/14/2014   Sleep apnea    uses cpap   SVT (supraventricular tachycardia)    TRIGGER FINGER 02/25/2008   Annotation: right index finger Qualifier: History of  By: Genelle Gather CMA, [place]     Tubular adenoma    TUBULOVILLOUS ADENOMA, COLON 02/25/2008   Qualifier: History of  By: Genelle Gather CMA, [place]     Unspecified personal history presenting hazards to health 02/25/2008   Centricity Description: POLYPECTOMY, HX OF Qualifier: Diagnosis of  By: Genelle Gather CMA, [place]   Centricity Description: PROSTATECTOMY, TRANSURETHRAL, HX OF Qualifier: Diagnosis of  By: Genelle Gather CMA, [place]     Past Surgical History:  Procedure Laterality Date   ANGIOPLASTY  2002   with stent and angioplasty in 2003   APPENDECTOMY     BACK SURGERY     CARDIAC CATHETERIZATION  05/07/2002   Ejection fraction 20%   CARPAL TUNNEL RELEASE Left 08/29/2016   Procedure: LEFT CARPAL TUNNEL RELEASE;  Surgeon: Cindee Salt, MD;  Location:  SURGERY CENTER;  Service: Orthopedics;  Laterality: Left;  REG/FAB   FINGER SURGERY     HIP CLOSED REDUCTION  01/31/2012   Procedure: CLOSED MANIPULATION HIP;  Surgeon: Loanne Drilling, MD;  Location: [facility];  Service: Orthopedics;  Laterality: Right;  closed reduction right dislocated hip   HIP CLOSED REDUCTION  10/10/2012   Procedure: CLOSED MANIPULATION HIP;  Surgeon: Eugenia Mcalpine, MD;  Location: [facility];  Service: Orthopedics;  Laterality: Right;   HIP CLOSED REDUCTION Right 03/17/2015   Procedure: CLOSED REDUCTION HIP;  Surgeon: Samson Frederic, MD;  Location: [facility];  Service: Orthopedics;  Laterality: Right;   JOINT REPLACEMENT     BILATERAL HIP REPLACEMENTS   TONSILLECTOMY     TOTAL HIP REVISION Right 09/06/2015   Procedure: RIGHT TOTAL HIP ACETABULAR LINER REVISION (CONSTRAINED LINER);  Surgeon: Ollen Gross, MD;  Location: [facility];   Service: Orthopedics;  Laterality: Right;   TRANSURETHRAL RESECTION OF PROSTATE       A IV Location/Drains/Wounds Patient Lines/Drains/Airways Status     Active Line/Drains/Airways     Name Placement date Placement time Site Days   Peripheral IV 06/30/23 1.88" Left;Lateral Forearm 06/30/23  2301  Forearm  1   Incision (Closed) 09/06/15 Hip Right 09/06/15  1339  -- 2855   Incision (Closed) 08/29/16 Arm Left 08/29/16  0921  -- 2497            Intake/Output Last 24 hours No intake or output data in the 24 hours ending 07/01/23 0251  Labs/Imaging Results for orders placed or performed during the hospital encounter of 06/30/23 (from the past 48 hour(s))  Basic metabolic panel     Status:  Abnormal   Collection Time: 06/30/23  8:36 PM  Result Value Ref Range   Sodium 133 (L) 135 - 145 mmol/L   Potassium 4.9 3.5 - 5.1 mmol/L   Chloride 100 98 - 111 mmol/L   CO2 21 (L) 22 - 32 mmol/L   Glucose, Bld 102 (H) 70 - 99 mg/dL    Comment: Glucose reference range applies only to samples taken after fasting for at least 8 hours.   BUN 33 (H) 8 - 23 mg/dL   Creatinine, Ser 1.91 (H) 0.61 - 1.24 mg/dL   Calcium 9.6 8.9 - 47.8 mg/dL   GFR, Estimated 40 (L) >60 mL/min    Comment: (NOTE) Calculated using the CKD-EPI Creatinine Equation (2021)    Anion gap 12 5 - 15    Comment: Performed at [facility], [address], [place], [place] [address]  CBC     Status: Abnormal   Collection Time: 06/30/23  8:36 PM  Result Value Ref Range   WBC 5.7 4.0 - 10.5 K/uL   RBC 3.96 (L) 4.22 - 5.81 MIL/uL   Hemoglobin 11.6 (L) 13.0 - 17.0 g/dL   HCT 13.0 (L) 86.5 - 78.4 %   MCV 93.7 80.0 - 100.0 fL   MCH 29.3 26.0 - 34.0 pg   MCHC 31.3 30.0 - 36.0 g/dL   RDW 69.6 (H) 29.5 - 28.4 %   Platelets 160 150 - 400 K/uL   nRBC 0.0 0.0 - 0.2 %    Comment: Performed at [facility], [address] [place], [place] [address]  Troponin I (High Sensitivity)     Status: Abnormal   Collection Time:  06/30/23  8:36 PM  Result Value Ref Range   Troponin I (High Sensitivity) 72 (H) <18 ng/L    Comment: (NOTE) Elevated high sensitivity troponin I (hsTnI) values and significant  changes across serial measurements may suggest ACS but many other  chronic and acute conditions are known to elevate hsTnI results.  Refer to the "Links" section for chest pain algorithms and additional  guidance. Performed at [facility], [address] [place], [place] [address]   Protime-INR (order if Patient is taking Coumadin / Warfarin)     Status: Abnormal   Collection Time: 06/30/23  8:36 PM  Result Value Ref Range   Prothrombin Time 17.3 (H) 11.4 - 15.2 seconds   INR 1.4 (H) 0.8 - 1.2    Comment: (NOTE) INR goal varies based on device and disease states. Performed at [facility], [address], [place], [place] [address]   Brain natriuretic peptide     Status: Abnormal   Collection Time: 06/30/23  8:36 PM  Result Value Ref Range   B Natriuretic Peptide 1,922.7 (H) 0.0 - 100.0 pg/mL    Comment: Performed at [facility], [address], [place], [place] [address]  Troponin I (High Sensitivity)     Status: Abnormal   Collection Time: 06/30/23 10:43 PM  Result Value Ref Range   Troponin I (High Sensitivity) 67 (H) <18 ng/L    Comment: (NOTE) Elevated high sensitivity troponin I (hsTnI) values and significant  changes across serial measurements may suggest ACS but many other  chronic and acute conditions are known to elevate hsTnI results.  Refer to the "Links" section for chest pain algorithms and additional  guidance. Performed at [facility], [address] [place], [place] [address]    DG Chest 2 View  Result Date: 06/30/2023 CLINICAL DATA:  Shortness of breath EXAM: CHEST - 2 VIEW COMPARISON:  04/10/2021 FINDINGS: Moderate bilateral pleural effusions and associated airspace opacities. Stable enlargement of the cardiomediastinal silhouette. Aortic  atherosclerotic calcification. No pneumothorax. No displaced rib fractures. IMPRESSION: Moderate bilateral pleural effusions and associated atelectasis or pneumonia. Electronically Signed   By: Minerva Fester M.D.   On: 06/30/2023 20:28    Pending Labs Unresulted Labs (From admission, onward)    None       Vitals/Pain Today's Vitals   06/30/23 2145 06/30/23 2154 07/01/23 0015 07/01/23 0020  BP: 119/78  110/75   Pulse: 86  75   Resp: 16  (!) 23   Temp:    97.6 F (36.4 C)  TempSrc:      SpO2: 100%  97%   Weight:  68 kg    Height:  5\' 5"  (1.651 m)    PainSc:        Isolation Precautions No active isolations  Medications Medications  furosemide (LASIX) injection 60 mg (60 mg Intravenous Given 07/01/23 0013)    Mobility non-ambulatory     Focused Assessments Cardiac Assessment Handoff:  Cardiac Rhythm: Atrial fibrillation Lab Results  Component Value Date   TROPONINI 0.06 (H) 06/02/2016   No results found for: "DDIMER" Does the Patient currently have chest pain? No    R Recommendations: See Admitting Provider Note  Report given to:   Additional Notes:

## 2023-07-01 NOTE — ED Notes (Signed)
 [facility] Called, no ETA

## 2023-07-08 ENCOUNTER — Telehealth: Payer: Self-pay | Admitting: Cardiology

## 2023-07-08 NOTE — Telephone Encounter (Signed)
Spoke with staff at [facility] place who states that the pt has increased lower extremity swelling, shortness of breath and is now on O2 as a result. Pt has not been seen by Dr. Dulce Sellar since 2022 and by [place] since 2023. Pt advised to go to the ED for evaluation as he may need IV diuretics and then follow up with office. Pt lives in [place] and would like to be seen in their office. Staff at [facility] place verbalized understanding and had no additional questions.

## 2023-07-08 NOTE — Telephone Encounter (Signed)
Pt c/o swelling/edema: STAT if pt has developed SOB within 24 hours  If swelling, where is the swelling located? Both legs  How much weight have you gained and in what time span? 154 lbs in January; 168 lbs currently; highest was 176 lbs  Have you gained 2 pounds in a day or 5 pounds in a week? Yes   Do you have a log of your daily weights (if so, list)? Yes   Are you currently taking a fluid pill? Was started on torsemide on 7/18 by NP at [facility]  Are you currently SOB? Yes; started on oxygen 2 weeks ago  Have you traveled recently in a car or plane for an extended period of time? No

## 2023-07-15 ENCOUNTER — Inpatient Hospital Stay (HOSPITAL_COMMUNITY)
Admission: EM | Admit: 2023-07-15 | Discharge: 2023-07-26 | DRG: 189 | Disposition: A | Discharge status: Skilled Nursing Facility | Payer: Medicare Other | Source: Skilled Nursing Facility | Attending: Internal Medicine | Admitting: Internal Medicine

## 2023-07-15 ENCOUNTER — Other Ambulatory Visit: Payer: Self-pay

## 2023-07-15 ENCOUNTER — Emergency Department (HOSPITAL_COMMUNITY): Payer: Medicare Other

## 2023-07-15 DIAGNOSIS — I4821 Permanent atrial fibrillation: Secondary | ICD-10-CM | POA: Diagnosis present

## 2023-07-15 DIAGNOSIS — Z66 Do not resuscitate: Secondary | ICD-10-CM | POA: Diagnosis present

## 2023-07-15 DIAGNOSIS — I2489 Other forms of acute ischemic heart disease: Secondary | ICD-10-CM | POA: Diagnosis present

## 2023-07-15 DIAGNOSIS — J069 Acute upper respiratory infection, unspecified: Secondary | ICD-10-CM | POA: Diagnosis present

## 2023-07-15 DIAGNOSIS — E8770 Fluid overload, unspecified: Secondary | ICD-10-CM | POA: Diagnosis not present

## 2023-07-15 DIAGNOSIS — I25119 Atherosclerotic heart disease of native coronary artery with unspecified angina pectoris: Secondary | ICD-10-CM | POA: Diagnosis present

## 2023-07-15 DIAGNOSIS — N1832 Chronic kidney disease, stage 3b: Secondary | ICD-10-CM | POA: Diagnosis present

## 2023-07-15 DIAGNOSIS — E039 Hypothyroidism, unspecified: Secondary | ICD-10-CM | POA: Diagnosis present

## 2023-07-15 DIAGNOSIS — I5022 Chronic systolic (congestive) heart failure: Secondary | ICD-10-CM

## 2023-07-15 DIAGNOSIS — Z8249 Family history of ischemic heart disease and other diseases of the circulatory system: Secondary | ICD-10-CM

## 2023-07-15 DIAGNOSIS — Y732 Prosthetic and other implants, materials and accessory gastroenterology and urology devices associated with adverse incidents: Secondary | ICD-10-CM | POA: Diagnosis not present

## 2023-07-15 DIAGNOSIS — N401 Enlarged prostate with lower urinary tract symptoms: Secondary | ICD-10-CM | POA: Diagnosis present

## 2023-07-15 DIAGNOSIS — D62 Acute posthemorrhagic anemia: Secondary | ICD-10-CM | POA: Diagnosis present

## 2023-07-15 DIAGNOSIS — R5383 Other fatigue: Secondary | ICD-10-CM | POA: Diagnosis not present

## 2023-07-15 DIAGNOSIS — I480 Paroxysmal atrial fibrillation: Secondary | ICD-10-CM | POA: Diagnosis present

## 2023-07-15 DIAGNOSIS — Z955 Presence of coronary angioplasty implant and graft: Secondary | ICD-10-CM

## 2023-07-15 DIAGNOSIS — R0902 Hypoxemia: Principal | ICD-10-CM

## 2023-07-15 DIAGNOSIS — Z87892 Personal history of anaphylaxis: Secondary | ICD-10-CM

## 2023-07-15 DIAGNOSIS — Z515 Encounter for palliative care: Secondary | ICD-10-CM | POA: Diagnosis not present

## 2023-07-15 DIAGNOSIS — R0602 Shortness of breath: Secondary | ICD-10-CM | POA: Diagnosis present

## 2023-07-15 DIAGNOSIS — N138 Other obstructive and reflux uropathy: Secondary | ICD-10-CM | POA: Diagnosis not present

## 2023-07-15 DIAGNOSIS — Z7901 Long term (current) use of anticoagulants: Secondary | ICD-10-CM

## 2023-07-15 DIAGNOSIS — E871 Hypo-osmolality and hyponatremia: Secondary | ICD-10-CM | POA: Diagnosis not present

## 2023-07-15 DIAGNOSIS — I252 Old myocardial infarction: Secondary | ICD-10-CM

## 2023-07-15 DIAGNOSIS — I5023 Acute on chronic systolic (congestive) heart failure: Secondary | ICD-10-CM | POA: Diagnosis not present

## 2023-07-15 DIAGNOSIS — J9621 Acute and chronic respiratory failure with hypoxia: Secondary | ICD-10-CM | POA: Diagnosis not present

## 2023-07-15 DIAGNOSIS — I5043 Acute on chronic combined systolic (congestive) and diastolic (congestive) heart failure: Secondary | ICD-10-CM | POA: Diagnosis present

## 2023-07-15 DIAGNOSIS — G4730 Sleep apnea, unspecified: Secondary | ICD-10-CM | POA: Diagnosis present

## 2023-07-15 DIAGNOSIS — Z886 Allergy status to analgesic agent status: Secondary | ICD-10-CM

## 2023-07-15 DIAGNOSIS — I130 Hypertensive heart and chronic kidney disease with heart failure and stage 1 through stage 4 chronic kidney disease, or unspecified chronic kidney disease: Secondary | ICD-10-CM | POA: Diagnosis present

## 2023-07-15 DIAGNOSIS — F03B11 Unspecified dementia, moderate, with agitation: Secondary | ICD-10-CM

## 2023-07-15 DIAGNOSIS — Z91014 Allergy to mammalian meats: Secondary | ICD-10-CM

## 2023-07-15 DIAGNOSIS — F03C11 Unspecified dementia, severe, with agitation: Secondary | ICD-10-CM | POA: Diagnosis present

## 2023-07-15 DIAGNOSIS — I2510 Atherosclerotic heart disease of native coronary artery without angina pectoris: Secondary | ICD-10-CM | POA: Diagnosis not present

## 2023-07-15 DIAGNOSIS — R310 Gross hematuria: Secondary | ICD-10-CM | POA: Diagnosis present

## 2023-07-15 DIAGNOSIS — K219 Gastro-esophageal reflux disease without esophagitis: Secondary | ICD-10-CM | POA: Diagnosis present

## 2023-07-15 DIAGNOSIS — Z79890 Hormone replacement therapy (postmenopausal): Secondary | ICD-10-CM

## 2023-07-15 DIAGNOSIS — I110 Hypertensive heart disease with heart failure: Secondary | ICD-10-CM | POA: Diagnosis not present

## 2023-07-15 DIAGNOSIS — N183 Chronic kidney disease, stage 3 unspecified: Secondary | ICD-10-CM | POA: Diagnosis present

## 2023-07-15 DIAGNOSIS — Y846 Urinary catheterization as the cause of abnormal reaction of the patient, or of later complication, without mention of misadventure at the time of the procedure: Secondary | ICD-10-CM | POA: Diagnosis not present

## 2023-07-15 DIAGNOSIS — N179 Acute kidney failure, unspecified: Secondary | ICD-10-CM | POA: Diagnosis not present

## 2023-07-15 DIAGNOSIS — H9193 Unspecified hearing loss, bilateral: Secondary | ICD-10-CM | POA: Diagnosis present

## 2023-07-15 DIAGNOSIS — I4811 Longstanding persistent atrial fibrillation: Secondary | ICD-10-CM | POA: Diagnosis not present

## 2023-07-15 DIAGNOSIS — Z820 Family history of epilepsy and other diseases of the nervous system: Secondary | ICD-10-CM

## 2023-07-15 DIAGNOSIS — J9691 Respiratory failure, unspecified with hypoxia: Secondary | ICD-10-CM | POA: Diagnosis present

## 2023-07-15 DIAGNOSIS — Z888 Allergy status to other drugs, medicaments and biological substances status: Secondary | ICD-10-CM

## 2023-07-15 DIAGNOSIS — I5021 Acute systolic (congestive) heart failure: Secondary | ICD-10-CM | POA: Diagnosis not present

## 2023-07-15 DIAGNOSIS — Z96643 Presence of artificial hip joint, bilateral: Secondary | ICD-10-CM | POA: Diagnosis present

## 2023-07-15 DIAGNOSIS — I255 Ischemic cardiomyopathy: Secondary | ICD-10-CM | POA: Diagnosis present

## 2023-07-15 DIAGNOSIS — E7800 Pure hypercholesterolemia, unspecified: Secondary | ICD-10-CM | POA: Diagnosis present

## 2023-07-15 DIAGNOSIS — I959 Hypotension, unspecified: Secondary | ICD-10-CM | POA: Diagnosis not present

## 2023-07-15 DIAGNOSIS — Z9889 Other specified postprocedural states: Secondary | ICD-10-CM

## 2023-07-15 DIAGNOSIS — Z79899 Other long term (current) drug therapy: Secondary | ICD-10-CM

## 2023-07-15 DIAGNOSIS — Z87442 Personal history of urinary calculi: Secondary | ICD-10-CM

## 2023-07-15 DIAGNOSIS — F05 Delirium due to known physiological condition: Secondary | ICD-10-CM | POA: Diagnosis present

## 2023-07-15 DIAGNOSIS — F0390 Unspecified dementia without behavioral disturbance: Secondary | ICD-10-CM | POA: Diagnosis present

## 2023-07-15 DIAGNOSIS — T8383XA Hemorrhage of genitourinary prosthetic devices, implants and grafts, initial encounter: Secondary | ICD-10-CM | POA: Diagnosis not present

## 2023-07-15 DIAGNOSIS — K589 Irritable bowel syndrome without diarrhea: Secondary | ICD-10-CM | POA: Diagnosis present

## 2023-07-15 DIAGNOSIS — M7989 Other specified soft tissue disorders: Secondary | ICD-10-CM | POA: Diagnosis not present

## 2023-07-15 DIAGNOSIS — R3915 Urgency of urination: Secondary | ICD-10-CM | POA: Diagnosis present

## 2023-07-15 DIAGNOSIS — Z823 Family history of stroke: Secondary | ICD-10-CM

## 2023-07-15 DIAGNOSIS — Z809 Family history of malignant neoplasm, unspecified: Secondary | ICD-10-CM

## 2023-07-15 DIAGNOSIS — Z86010 Personal history of colonic polyps: Secondary | ICD-10-CM

## 2023-07-15 DIAGNOSIS — Z9079 Acquired absence of other genital organ(s): Secondary | ICD-10-CM

## 2023-07-15 LAB — CBC WITH DIFFERENTIAL/PLATELET
Abs Immature Granulocytes: 0.05 10*3/uL (ref 0.00–0.07)
Basophils Absolute: 0.1 10*3/uL (ref 0.0–0.1)
Basophils Relative: 1 %
Eosinophils Absolute: 0.7 10*3/uL — ABNORMAL HIGH (ref 0.0–0.5)
Eosinophils Relative: 9 %
HCT: 34 % — ABNORMAL LOW (ref 39.0–52.0)
Hemoglobin: 10.9 g/dL — ABNORMAL LOW (ref 13.0–17.0)
Immature Granulocytes: 1 %
Lymphocytes Relative: 16 %
Lymphs Abs: 1.2 10*3/uL (ref 0.7–4.0)
MCH: 29.9 pg (ref 26.0–34.0)
MCHC: 32.1 g/dL (ref 30.0–36.0)
MCV: 93.2 fL (ref 80.0–100.0)
Monocytes Absolute: 1.1 10*3/uL — ABNORMAL HIGH (ref 0.1–1.0)
Monocytes Relative: 15 %
Neutro Abs: 4.3 10*3/uL (ref 1.7–7.7)
Neutrophils Relative %: 58 %
Platelets: 173 10*3/uL (ref 150–400)
RBC: 3.65 MIL/uL — ABNORMAL LOW (ref 4.22–5.81)
RDW: 17.1 % — ABNORMAL HIGH (ref 11.5–15.5)
WBC: 7.4 10*3/uL (ref 4.0–10.5)
nRBC: 0 % (ref 0.0–0.2)

## 2023-07-15 LAB — BASIC METABOLIC PANEL
Anion gap: 10 (ref 5–15)
BUN: 54 mg/dL — ABNORMAL HIGH (ref 8–23)
CO2: 24 mmol/L (ref 22–32)
Calcium: 9 mg/dL (ref 8.9–10.3)
Chloride: 101 mmol/L (ref 98–111)
Creatinine, Ser: 1.73 mg/dL — ABNORMAL HIGH (ref 0.61–1.24)
GFR, Estimated: 36 mL/min — ABNORMAL LOW (ref >60)
Glucose, Bld: 104 mg/dL — ABNORMAL HIGH (ref 70–99)
Potassium: 4 mmol/L (ref 3.5–5.1)
Sodium: 135 mmol/L (ref 135–145)

## 2023-07-15 LAB — URINALYSIS, W/ REFLEX TO CULTURE (INFECTION SUSPECTED)
Bacteria, UA: NONE SEEN
Bilirubin Urine: NEGATIVE
Glucose, UA: NEGATIVE mg/dL
Ketones, ur: NEGATIVE mg/dL
Nitrite: NEGATIVE
Protein, ur: NEGATIVE mg/dL
Specific Gravity, Urine: 1.005 (ref 1.005–1.030)
pH: 5 (ref 5.0–8.0)

## 2023-07-15 LAB — TROPONIN I (HIGH SENSITIVITY)
Troponin I (High Sensitivity): 104 ng/L (ref <18)
Troponin I (High Sensitivity): 117 ng/L (ref <18)

## 2023-07-15 LAB — BRAIN NATRIURETIC PEPTIDE: B Natriuretic Peptide: 1862 pg/mL — ABNORMAL HIGH (ref 0.0–100.0)

## 2023-07-15 LAB — TSH: TSH: 19.276 u[IU]/mL — ABNORMAL HIGH (ref 0.350–4.500)

## 2023-07-15 LAB — AMMONIA: Ammonia: 33 umol/L (ref 9–35)

## 2023-07-15 MED ORDER — LIDOCAINE 5 % EX PTCH
1.0000 | MEDICATED_PATCH | CUTANEOUS | Status: DC
  Administered 2023-07-20 – 2023-07-25 (×5): 1 via TRANSDERMAL
  Filled 2023-07-15 (×5): qty 1

## 2023-07-15 MED ORDER — MORPHINE SULFATE (PF) 2 MG/ML IV SOLN
2.0000 mg | Freq: Once | INTRAVENOUS | Status: AC
  Administered 2023-07-15: 2 mg via INTRAVENOUS
  Filled 2023-07-15: qty 1

## 2023-07-15 MED ORDER — MELATONIN 3 MG PO TABS
3.0000 mg | ORAL_TABLET | Freq: Every evening | ORAL | Status: DC | PRN
  Administered 2023-07-16 – 2023-07-25 (×8): 3 mg via ORAL
  Filled 2023-07-15 (×8): qty 1

## 2023-07-15 MED ORDER — FUROSEMIDE 10 MG/ML IJ SOLN: 10.0000 mg | Freq: Two times a day (BID) | INTRAMUSCULAR | Status: DC

## 2023-07-15 MED ORDER — CALCIUM CARBONATE ANTACID 500 MG PO CHEW
1.0000 | CHEWABLE_TABLET | Freq: Every day | ORAL | Status: DC
  Administered 2023-07-17 – 2023-07-25 (×9): 200 mg via ORAL
  Filled 2023-07-15 (×9): qty 1

## 2023-07-15 MED ORDER — LEVOTHYROXINE SODIUM 25 MCG PO TABS
25.0000 ug | ORAL_TABLET | Freq: Every day | ORAL | Status: DC
  Administered 2023-07-16 – 2023-07-25 (×10): 25 ug via ORAL
  Filled 2023-07-15 (×9): qty 1

## 2023-07-15 MED ORDER — FUROSEMIDE 10 MG/ML IJ SOLN
10.0000 mg | Freq: Once | INTRAMUSCULAR | Status: AC
  Administered 2023-07-15: 10 mg via INTRAVENOUS
  Filled 2023-07-15: qty 4

## 2023-07-15 MED ORDER — METOPROLOL SUCCINATE ER 25 MG PO TB24
25.0000 mg | ORAL_TABLET | Freq: Every day | ORAL | Status: DC
  Administered 2023-07-16 – 2023-07-18 (×3): 25 mg via ORAL
  Filled 2023-07-15 (×3): qty 1

## 2023-07-15 MED ORDER — GABAPENTIN 100 MG PO CAPS
100.0000 mg | ORAL_CAPSULE | ORAL | Status: DC
  Administered 2023-07-15 – 2023-07-25 (×7): 100 mg via ORAL
  Filled 2023-07-15 (×8): qty 1

## 2023-07-15 MED ORDER — SENNA 8.6 MG PO TABS
1.0000 | ORAL_TABLET | Freq: Two times a day (BID) | ORAL | Status: DC
  Administered 2023-07-15 – 2023-07-25 (×14): 8.6 mg via ORAL
  Filled 2023-07-15 (×18): qty 1

## 2023-07-15 MED ORDER — SIMVASTATIN 10 MG PO TABS
10.0000 mg | ORAL_TABLET | Freq: Every day | ORAL | Status: DC
  Administered 2023-07-15 – 2023-07-25 (×11): 10 mg via ORAL
  Filled 2023-07-15 (×11): qty 1

## 2023-07-15 MED ORDER — OXYCODONE HCL 5 MG PO TABS
2.5000 mg | ORAL_TABLET | Freq: Three times a day (TID) | ORAL | Status: DC | PRN
  Administered 2023-07-20: 2.5 mg via ORAL
  Filled 2023-07-15: qty 1

## 2023-07-15 MED ORDER — PSYLLIUM 95 % PO PACK
1.0000 | PACK | Freq: Every day | ORAL | Status: DC
  Administered 2023-07-18 – 2023-07-25 (×5): 1 via ORAL
  Filled 2023-07-15 (×8): qty 1

## 2023-07-15 MED ORDER — SACUBITRIL-VALSARTAN 49-51 MG PO TABS
1.0000 | ORAL_TABLET | Freq: Two times a day (BID) | ORAL | Status: DC
  Administered 2023-07-15: 1 via ORAL
  Filled 2023-07-15 (×2): qty 1

## 2023-07-15 MED ORDER — DONEPEZIL HCL 10 MG PO TABS
10.0000 mg | ORAL_TABLET | Freq: Every day | ORAL | Status: DC
  Administered 2023-07-15 – 2023-07-25 (×11): 10 mg via ORAL
  Filled 2023-07-15: qty 2
  Filled 2023-07-15 (×10): qty 1

## 2023-07-15 MED ORDER — LORAZEPAM 2 MG/ML IJ SOLN
0.2500 mg | Freq: Once | INTRAMUSCULAR | Status: AC
  Administered 2023-07-15: 0.25 mg via INTRAVENOUS
  Filled 2023-07-15: qty 1

## 2023-07-15 MED ORDER — TAMSULOSIN HCL 0.4 MG PO CAPS
0.4000 mg | ORAL_CAPSULE | Freq: Every day | ORAL | Status: DC
  Administered 2023-07-16 – 2023-07-22 (×7): 0.4 mg via ORAL
  Filled 2023-07-15 (×7): qty 1

## 2023-07-15 MED ORDER — ONDANSETRON HCL 4 MG PO TABS: 4.0000 mg | ORAL_TABLET | Freq: Four times a day (QID) | ORAL | Status: DC | PRN

## 2023-07-15 MED ORDER — APIXABAN 2.5 MG PO TABS
2.5000 mg | ORAL_TABLET | Freq: Two times a day (BID) | ORAL | Status: DC
  Administered 2023-07-15 – 2023-07-17 (×4): 2.5 mg via ORAL
  Filled 2023-07-15 (×4): qty 1

## 2023-07-15 MED ORDER — ONDANSETRON HCL 4 MG/2ML IJ SOLN: 4.0000 mg | Freq: Four times a day (QID) | INTRAMUSCULAR | Status: DC | PRN

## 2023-07-15 MED ORDER — ESCITALOPRAM OXALATE 10 MG PO TABS
5.0000 mg | ORAL_TABLET | Freq: Every day | ORAL | Status: DC
  Administered 2023-07-16 – 2023-07-25 (×10): 5 mg via ORAL
  Filled 2023-07-15 (×10): qty 1

## 2023-07-15 MED ORDER — SORBITOL 70 % SOLN
30.0000 mL | Freq: Every day | Status: DC | PRN
  Filled 2023-07-15: qty 30

## 2023-07-15 NOTE — ED Triage Notes (Addendum)
Pt BIB [facility] from [facility] for elevated BNP. Hx chf, dementia

## 2023-07-15 NOTE — H&P (Addendum)
History and Physical    Patient: [name] ZOX:[id number] DOB: September 24, 1925 DOA: 07/15/2023 DOS: the patient was seen and examined on 07/15/2023 PCP: Lottie Dawson  Patient coming from: SNF  Chief Complaint:  Chief Complaint  Patient presents with   abnormal labs   HPI: [name] is a 89 y.o. male with medical history significant for history of coronary artery disease and stents, history of paroxysmal atrial fibrillation on Eliquis, CKD, pleural effusions, and severe dementia who was sent in from the nursing home because he has been sleeping a lot more lethargic which is not his baseline.  At baseline he "races" around the nursing facility in his wheelchair and he can stand up and go to the bathroom independently.  The patient does have dementia but when I ask he denies any trouble breathing or chest pain. The emergency department his O2 sats were in the mid 80s on room air.  Once he was put on oxygen he was much less lethargic as a matter fact he got pretty agitated in the ED.  He is being admitted for hypoxia and possible CHF exacerbation.   Review of Systems: unable to review all systems due to the inability of the patient to answer questions. Past Medical History:  Diagnosis Date   Anaphylactic reaction 06/02/2016   APPENDECTOMY, HX OF 02/25/2008   Qualifier: Diagnosis of  By: Genelle Gather CMA, [place]     Bilateral carpal tunnel syndrome 08/07/2016   Bilateral hearing loss due to cerumen impaction 04/02/2018   Bilateral impacted cerumen 10/27/2018   Bilateral leg weakness 01/25/2019   BPH (benign prostatic hyperplasia)    BPH with obstruction/lower urinary tract symptoms 02/25/2008   Qualifier: Diagnosis of  By: Genelle Gather CMA, [place]     CARPAL TUNNEL RELEASE, RIGHT, HX OF 02/25/2008   Qualifier: Diagnosis of  By: Genelle Gather CMA, [place]     Chronic sinus bradycardia    Chronic systolic (congestive) heart failure (HCC)    CKD (chronic kidney disease) stage 3, GFR  30-59 ml/min (HCC) 06/02/2016   CKD (chronic kidney disease), stage III (HCC)    Coronary artery disease    Coronary artery disease involving native coronary artery of native heart with angina pectoris (HCC) 02/25/2008   Qualifier: Diagnosis of  By: Genelle Gather CMA, [place]     Cough 11/03/2008   Qualifier: Diagnosis of  By: Debby Bud MD, Rosalyn Gess    DEGENERATIVE JOINT DISEASE, CERVICAL SPINE 02/25/2008   Qualifier: Diagnosis of  By: Genelle Gather CMA, [place]     Diverticulosis    DIVERTICULOSIS, COLON 02/25/2008   Qualifier: Diagnosis of  By: Genelle Gather CMA, [place]     GERD 02/25/2008   Qualifier: Diagnosis of  By: Genelle Gather CMA, [place]     GERD (gastroesophageal reflux disease)    Gross hematuria 01/28/2017   Hip dislocation 01/31/2012   HLD (hyperlipidemia) 02/25/2008   Qualifier: Diagnosis of  By: Genelle Gather CMA, [place]     Hypercholesterolemia    HYPERGLYCEMIA 02/25/2008   Qualifier: History of  By: Genelle Gather CMA, [place]     Hypertension    Hypertensive heart disease with heart failure (HCC) 02/25/2008   Qualifier: Diagnosis of  By: Genelle Gather CMA, [place]     IBS (irritable bowel syndrome)    Ischemic cardiomyopathy 09/21/2018   Kidney stone    Left hip pain    Chronic left hip discomfort   Lipoma of back 08/06/2016   Mechanical instability of hip prosthesis (HCC) 09/06/2015   MYOCARDIAL INFARCTION 02/25/2008   Qualifier:  History of  By: Genelle Gather CMA, [place]     Myocardial infarction [facility]) 2001   Previous anterolateral myocardial infarction with presistent ST-T wave changes   NEPHROLITHIASIS, HX OF 02/25/2008   Qualifier: Diagnosis of  By: Genelle Gather CMA, [place]     Osteoarthritis    Paroxysmal atrial fibrillation (HCC) 09/21/2018   PERCUTANEOUS TRANSLUMINAL CORONARY ANGIOPLASTY, HX OF 02/25/2008   Qualifier: Diagnosis of  By: Genelle Gather CMA, [place]     Primary osteoarthritis of both first carpometacarpal joints 08/07/2016   Primary osteoarthritis of both hands 08/07/2016   Renal calculus 06/30/2013   Sinus bradycardia  11/14/2014   Sleep apnea    uses cpap   SVT (supraventricular tachycardia)    TRIGGER FINGER 02/25/2008   Annotation: right index finger Qualifier: History of  By: Genelle Gather CMA, [place]     Tubular adenoma    TUBULOVILLOUS ADENOMA, COLON 02/25/2008   Qualifier: History of  By: Genelle Gather CMA, [place]     Unspecified personal history presenting hazards to health 02/25/2008   Centricity Description: POLYPECTOMY, HX OF Qualifier: Diagnosis of  By: Genelle Gather CMA, [place]   Centricity Description: PROSTATECTOMY, TRANSURETHRAL, HX OF Qualifier: Diagnosis of  By: Genelle Gather CMA, [place]     Past Surgical History:  Procedure Laterality Date   ANGIOPLASTY  2002   with stent and angioplasty in 2003   APPENDECTOMY     BACK SURGERY     CARDIAC CATHETERIZATION  05/07/2002   Ejection fraction 20%   CARPAL TUNNEL RELEASE Left 08/29/2016   Procedure: LEFT CARPAL TUNNEL RELEASE;  Surgeon: Cindee Salt, MD;  Location: [facility];  Service: Orthopedics;  Laterality: Left;  REG/FAB   FINGER SURGERY     HIP CLOSED REDUCTION  01/31/2012   Procedure: CLOSED MANIPULATION HIP;  Surgeon: Loanne Drilling, MD;  Location: [facility];  Service: Orthopedics;  Laterality: Right;  closed reduction right dislocated hip   HIP CLOSED REDUCTION  10/10/2012   Procedure: CLOSED MANIPULATION HIP;  Surgeon: Eugenia Mcalpine, MD;  Location: [facility];  Service: Orthopedics;  Laterality: Right;   HIP CLOSED REDUCTION Right 03/17/2015   Procedure: CLOSED REDUCTION HIP;  Surgeon: Samson Frederic, MD;  Location: [facility];  Service: Orthopedics;  Laterality: Right;   JOINT REPLACEMENT     BILATERAL HIP REPLACEMENTS   TONSILLECTOMY     TOTAL HIP REVISION Right 09/06/2015   Procedure: RIGHT TOTAL HIP ACETABULAR LINER REVISION (CONSTRAINED LINER);  Surgeon: Ollen Gross, MD;  Location: [facility];  Service: Orthopedics;  Laterality: Right;   TRANSURETHRAL RESECTION OF PROSTATE     Social History:  reports that he has never smoked. He has never used  smokeless tobacco. He reports that he does not drink alcohol and does not use drugs.  Allergies  Allergen Reactions   Beef-Derived Products Hives    Severe hives, nausea, throat swelling   Acetaminophen Other (See Comments)    urticaria urticaria   Alpha-Gal     Per mar   Amiodarone Other (See Comments)    Unknown  Affected my breathing   Prilosec [Omeprazole]     Diarrhea, stomach cramps   Prednisone Palpitations    REACTION: causes tachycardia, increase BP    Family History  Problem Relation Age of Onset   Heart attack Father    Stroke Father    Stroke Mother    Alzheimer's disease Mother    Cancer Sister     Prior to Admission medications   Medication Sig Start Date End Date Taking? Authorizing Provider  acetaminophen (TYLENOL) 500 MG tablet Take 250 mg by mouth every 4 (four) hours as needed for mild pain.    [provider]  calcium carbonate (TUMS - DOSED IN MG ELEMENTAL CALCIUM) 500 MG chewable tablet Chew 1 tablet by mouth daily.    [provider]  Cholecalciferol (VITAMIN D-3) 125 MCG (5000 UT) TABS Take by mouth daily.    [provider]  Dextromethorphan HBr 10 MG/5ML SYRP Take 5 mLs by mouth every 4 (four) hours as needed (cough/elevated temp).    [provider]  Diclofenac Sodium (ASPERCREME ARTHRITIS PAIN EX) Apply topically as needed (pain).    [provider]  diclofenac Sodium (VOLTAREN ARTHRITIS PAIN) 1 % GEL Apply topically as needed (pain).    [provider]  donepezil (ARICEPT) 10 MG tablet Take 10 mg by mouth at bedtime.    [provider]  ELIQUIS 2.5 MG TABS tablet TAKE 1 TABLET BY MOUTH TWICE A DAY 08/15/20   Baldo Daub, MD  escitalopram (LEXAPRO) 10 MG tablet Take 10 mg by mouth daily. 04/01/21   [provider]  ferrous sulfate 325 (65 FE) MG tablet Take 325 mg by mouth 2 (two) times daily with a meal.     [provider]  gabapentin (NEURONTIN) 100 MG capsule  Take 100 mg by mouth daily.    [provider]  loperamide (IMODIUM A-D) 2 MG tablet Take 2 mg by mouth every 4 (four) hours as needed for diarrhea or loose stools.    [provider]  LORazepam (ATIVAN) 0.5 MG tablet Take 1 tablet (0.5 mg total) by mouth 2 (two) times daily. 04/10/21   Burnadette Pop, MD  metoprolol succinate (TOPROL-XL) 25 MG 24 hr tablet Take 25 mg by mouth daily. 03/09/21   [provider]  oxyCODONE (ROXICODONE) 5 MG immediate release tablet Take 1 tablet (5 mg total) by mouth every 6 (six) hours as needed for severe pain. 04/10/21   Burnadette Pop, MD  polyethylene glycol (MIRALAX / GLYCOLAX) 17 g packet Take 17 g by mouth daily as needed for mild constipation. 04/10/21   Burnadette Pop, MD  sacubitril-valsartan (ENTRESTO) 49-51 MG Take 1 tablet by mouth 2 (two) times daily. Patient taking differently: Take 1 tablet by mouth daily. 06/14/20   Baldo Daub, MD  senna (SENOKOT) 8.6 MG TABS tablet Take 1 tablet (8.6 mg total) by mouth 2 (two) times daily. 04/10/21   Burnadette Pop, MD  tamsulosin (FLOMAX) 0.4 MG CAPS capsule Take 0.4 mg by mouth daily. 10/11/19   [provider]  torsemide (DEMADEX) 20 MG tablet Take 1 tablet (20 mg total) by mouth daily. 04/13/20   Georgeanna Lea, MD    Physical Exam: Vitals:   07/15/23 1259 07/15/23 1310 07/15/23 1635  BP: 115/68  109/84  Pulse: 83  80  Resp: 18  13  Temp: 97.6 F (36.4 C)  (!) 97.4 F (36.3 C)  TempSrc: Oral    SpO2: 100%  98%  Weight:  68 kg   Height:  5\' 5"  (1.651 m)    Physical Exam Vitals reviewed.  Constitutional:      Appearance: Normal appearance.  HENT:     Head: Normocephalic and atraumatic.     Nose: Nose normal.  Eyes:     General: No scleral icterus.    Pupils: Pupils are equal, round, and reactive to light.  Cardiovascular:     Rate and Rhythm: Regular rhythm. Bradycardia present.  Pulses: Normal pulses.  Pulmonary:     Effort: Pulmonary effort is  normal.     Breath sounds: Normal breath sounds.  Abdominal:     General: Bowel sounds are normal.     Palpations: Abdomen is soft.     Tenderness: There is no abdominal tenderness.  Musculoskeletal:     Right lower leg: Edema present.     Left lower leg: Edema present.     Comments: Trace edema in right low ext. 2+ edema in left lower ext,  Skin:    General: Skin is warm and dry.  Neurological:     Mental Status: He is alert. Mental status is at baseline.     Comments: Hard of hearing, cooperative  Psychiatric:     Comments: Known dementia      Data Reviewed:  Results for orders placed or performed during the hospital encounter of 07/15/23 (from the past 24 hour(s))  CBC with Differential     Status: Abnormal   Collection Time: 07/15/23  2:32 PM  Result Value Ref Range   WBC 7.4 4.0 - 10.5 K/uL   RBC 3.65 (L) 4.22 - 5.81 MIL/uL   Hemoglobin 10.9 (L) 13.0 - 17.0 g/dL   HCT 53.6 (L) 64.4 - 03.4 %   MCV 93.2 80.0 - 100.0 fL   MCH 29.9 26.0 - 34.0 pg   MCHC 32.1 30.0 - 36.0 g/dL   RDW 74.2 (H) 59.5 - 63.8 %   Platelets 173 150 - 400 K/uL   nRBC 0.0 0.0 - 0.2 %   Neutrophils Relative % 58 %   Neutro Abs 4.3 1.7 - 7.7 K/uL   Lymphocytes Relative 16 %   Lymphs Abs 1.2 0.7 - 4.0 K/uL   Monocytes Relative 15 %   Monocytes Absolute 1.1 (H) 0.1 - 1.0 K/uL   Eosinophils Relative 9 %   Eosinophils Absolute 0.7 (H) 0.0 - 0.5 K/uL   Basophils Relative 1 %   Basophils Absolute 0.1 0.0 - 0.1 K/uL   Immature Granulocytes 1 %   Abs Immature Granulocytes 0.05 0.00 - 0.07 K/uL  Basic metabolic panel     Status: Abnormal   Collection Time: 07/15/23  2:32 PM  Result Value Ref Range   Sodium 135 135 - 145 mmol/L   Potassium 4.0 3.5 - 5.1 mmol/L   Chloride 101 98 - 111 mmol/L   CO2 24 22 - 32 mmol/L   Glucose, Bld 104 (H) 70 - 99 mg/dL   BUN 54 (H) 8 - 23 mg/dL   Creatinine, Ser 7.56 (H) 0.61 - 1.24 mg/dL   Calcium 9.0 8.9 - 43.3 mg/dL   GFR, Estimated 36 (L) >60 mL/min   Anion  gap 10 5 - 15  Brain natriuretic peptide     Status: Abnormal   Collection Time: 07/15/23  2:32 PM  Result Value Ref Range   B Natriuretic Peptide 1,862.0 (H) 0.0 - 100.0 pg/mL  Troponin I (High Sensitivity)     Status: Abnormal   Collection Time: 07/15/23  2:32 PM  Result Value Ref Range   Troponin I (High Sensitivity) 104 (HH) <18 ng/L  Troponin I (High Sensitivity)     Status: Abnormal   Collection Time: 07/15/23  4:52 PM  Result Value Ref Range   Troponin I (High Sensitivity) 117 (HH) <18 ng/L  Urinalysis, w/ Reflex to Culture (Infection Suspected) -Urine, Clean Catch     Status: Abnormal   Collection Time: 07/15/23  5:15 PM  Result Value  Ref Range   Specimen Source URINE, CLEAN CATCH    Color, Urine STRAW (A) YELLOW   APPearance CLEAR CLEAR   Specific Gravity, Urine 1.005 1.005 - 1.030   pH 5.0 5.0 - 8.0   Glucose, UA NEGATIVE NEGATIVE mg/dL   Hgb urine dipstick SMALL (A) NEGATIVE   Bilirubin Urine NEGATIVE NEGATIVE   Ketones, ur NEGATIVE NEGATIVE mg/dL   Protein, ur NEGATIVE NEGATIVE mg/dL   Nitrite NEGATIVE NEGATIVE   Leukocytes,Ua SMALL (A) NEGATIVE   RBC / HPF 0-5 0 - 5 RBC/hpf   WBC, UA 0-5 0 - 5 WBC/hpf   Bacteria, UA NONE SEEN NONE SEEN   Squamous Epithelial / HPF 0-5 0 - 5 /HPF   Hyaline Casts, UA PRESENT     CXR IMPRESSION: Decreasing pleural effusions and adjacent opacities. Significant residual.   Enlarged heart with enlarged central pulmonary vessels.   Assessment and Plan: Hypoxia -the patient may need to keep oxygen after discharge.  His chest x-ray reveals improved CHF and smaller pleural effusions.  His diuresis seems to be working from that perspective.  - Will Continue gentle diuresis - Consider a VQ scan to rule out PE.  The patient is on Eliquis every day but he does have the asymmetric lower extremity edema. - Left lower ext doppler ordered Hypothyroidism, new diagnosis - Start synthyroid History of paroxysmal atrial fibrillation -continue  Eliquis 4.  Dementia with sundowning and agitation in the ED -the daughter generally stays with him in the hospital because he does have a lot of trouble with sundowning.  Will review his medication list once that is complete to find what works best for this. 5. CKD -monitor creatinine with diuresis    Advance Care Planning: The patient has a out of facility DNR form.  His daughter confirms that he is DNR.  The patient's healthcare power of attorney is his son.   Consults: none  Family Communication: Discussed with the patient's daughter who is present in the room.  Severity of Illness: The appropriate patient status for this patient is INPATIENT. Inpatient status is judged to be reasonable and necessary in order to provide the required intensity of service to ensure the patient's safety. The patient's presenting symptoms, physical exam findings, and initial radiographic and laboratory data in the context of their chronic comorbidities is felt to place them at high risk for further clinical deterioration. Furthermore, it is not anticipated that the patient will be medically stable for discharge from the hospital within 2 midnights of admission.   * I certify that at the point of admission it is my clinical judgment that the patient will require inpatient hospital care spanning beyond 2 midnights from the point of admission due to high intensity of service, high risk for further deterioration and high frequency of surveillance required.*  Author: Buena Irish, MD 07/15/2023 6:28 PM  For on call review [email or web address].

## 2023-07-15 NOTE — ED Provider Triage Note (Signed)
Emergency Medicine Provider Triage Evaluation Note  [name] , a 89 y.o. male  was evaluated in triage.  Pt complains of elevated BNP. History of dementia. Difficult to obtain HPI. Denies SOB and CP. BNP 1900 on 7/15.  Review of Systems  Positive: Lower extremity edema Negative: SOB, CP  Physical Exam  BP 115/68 (BP Location: Left Arm)   Pulse 83   Temp 97.6 F (36.4 C) (Oral)   Resp 18   Ht 5\' 5"  (1.651 m)   Wt 68 kg   SpO2 100%   BMI 24.95 kg/m  Gen:   Awake, no distress   Resp:  Normal effort  MSK:   Moves extremities without difficulty  Other:  2+ pitting edema bilaterally  Medical Decision Making  Medically screening exam initiated at 1:20 PM.  Appropriate orders placed.  [name] was informed that the remainder of the evaluation will be completed by another provider, this initial triage assessment does not replace that evaluation, and the importance of remaining in the ED until their evaluation is complete.  Labs CXR EKG   [name], [place] 07/15/23 1321

## 2023-07-15 NOTE — ED Notes (Signed)
Skin tear discovered on his left arm. Tegaderm used to dress it. Patient continues to be restless. Pulling at his catheter.

## 2023-07-15 NOTE — ED Provider Notes (Signed)
[place] [facility]MENT AT [facility] Provider Note   CSN: [id number] Arrival date & time: 07/15/23  1253     History  Chief Complaint  Patient presents with   abnormal labs    [name] is a 89 y.o. male.  The history is provided by the patient and medical records. No language interpreter was used.  Shortness of Breath Severity:  Moderate Onset quality:  Gradual Timing:  Intermittent Progression:  Waxing and waning Chronicity:  New Context: URI   Relieved by:  Nothing Worsened by:  Nothing Ineffective treatments:  None tried Associated symptoms: no abdominal pain, no chest pain, no cough, no diaphoresis, no fever, no headaches, no neck pain, no rash, no sputum production, no vomiting and no wheezing        Home Medications Prior to Admission medications   Medication Sig Start Date End Date Taking? Authorizing Provider  acetaminophen (TYLENOL) 500 MG tablet Take 250 mg by mouth every 4 (four) hours as needed for mild pain.    [provider]  calcium carbonate (TUMS - DOSED IN MG ELEMENTAL CALCIUM) 500 MG chewable tablet Chew 1 tablet by mouth daily.    [provider]  Cholecalciferol (VITAMIN D-3) 125 MCG (5000 UT) TABS Take by mouth daily.    [provider]  Dextromethorphan HBr 10 MG/5ML SYRP Take 5 mLs by mouth every 4 (four) hours as needed (cough/elevated temp).    [provider]  Diclofenac Sodium (ASPERCREME ARTHRITIS PAIN EX) Apply topically as needed (pain).    [provider]  diclofenac Sodium (VOLTAREN ARTHRITIS PAIN) 1 % GEL Apply topically as needed (pain).    [provider]  donepezil (ARICEPT) 10 MG tablet Take 10 mg by mouth at bedtime.    [provider]  ELIQUIS 2.5 MG TABS tablet TAKE 1 TABLET BY MOUTH TWICE A DAY 08/15/20   Baldo Daub, MD  escitalopram (LEXAPRO) 10 MG tablet Take 10 mg by mouth daily. 04/01/21   [provider]  ferrous sulfate  325 (65 FE) MG tablet Take 325 mg by mouth 2 (two) times daily with a meal.     [provider]  gabapentin (NEURONTIN) 100 MG capsule Take 100 mg by mouth daily.    [provider]  loperamide (IMODIUM A-D) 2 MG tablet Take 2 mg by mouth every 4 (four) hours as needed for diarrhea or loose stools.    [provider]  LORazepam (ATIVAN) 0.5 MG tablet Take 1 tablet (0.5 mg total) by mouth 2 (two) times daily. 04/10/21   Burnadette Pop, MD  metoprolol succinate (TOPROL-XL) 25 MG 24 hr tablet Take 25 mg by mouth daily. 03/09/21   [provider]  oxyCODONE (ROXICODONE) 5 MG immediate release tablet Take 1 tablet (5 mg total) by mouth every 6 (six) hours as needed for severe pain. 04/10/21   Burnadette Pop, MD  polyethylene glycol (MIRALAX / GLYCOLAX) 17 g packet Take 17 g by mouth daily as needed for mild constipation. 04/10/21   Burnadette Pop, MD  sacubitril-valsartan (ENTRESTO) 49-51 MG Take 1 tablet by mouth 2 (two) times daily. Patient taking differently: Take 1 tablet by mouth daily. 06/14/20   Baldo Daub, MD  senna (SENOKOT) 8.6 MG TABS tablet Take 1 tablet (8.6 mg total) by mouth 2 (two) times daily. 04/10/21   Burnadette Pop, MD  tamsulosin (FLOMAX) 0.4 MG CAPS capsule Take 0.4 mg by mouth daily. 10/11/19   [provider]  torsemide (DEMADEX) 20 MG tablet Take 1 tablet (20 mg total) by mouth daily. 04/13/20   Georgeanna Lea, MD      Allergies    Beef-derived products, Acetaminophen, Alpha-gal, Amiodarone, Prilosec [omeprazole], and Prednisone    Review of Systems   Review of Systems  Constitutional:  Positive for fatigue. Negative for chills, diaphoresis and fever.  HENT:  Negative for congestion.   Respiratory:  Positive for shortness of breath. Negative for cough, sputum production, chest tightness and wheezing.   Cardiovascular:  Positive for leg swelling. Negative for chest pain and palpitations.  Gastrointestinal:  Negative for  abdominal pain, constipation, diarrhea, nausea and vomiting.  Genitourinary:  Positive for dysuria. Negative for flank pain.  Musculoskeletal:  Negative for back pain, neck pain and neck stiffness.  Skin:  Negative for rash and wound.  Neurological:  Negative for light-headedness, numbness and headaches.  Psychiatric/Behavioral:  Negative for agitation and confusion.   All other systems reviewed and are negative.   Physical Exam Updated Vital Signs BP 115/68 (BP Location: Left Arm)   Pulse 83   Temp 97.6 F (36.4 C) (Oral)   Resp 18   Ht 5\' 5"  (1.651 m)   Wt 68 kg   SpO2 100%   BMI 24.95 kg/m  Physical Exam Vitals and nursing note reviewed.  Constitutional:      General: He is not in acute distress.    Appearance: He is well-developed. He is not ill-appearing, toxic-appearing or diaphoretic.  HENT:     Head: Normocephalic and atraumatic.     Nose: No congestion or rhinorrhea.     Mouth/Throat:     Pharynx: No oropharyngeal exudate or posterior oropharyngeal erythema.  Eyes:     Extraocular Movements: Extraocular movements intact.     Conjunctiva/sclera: Conjunctivae normal.     Pupils: Pupils are equal, round, and reactive to light.  Cardiovascular:     Rate and Rhythm: Normal rate and regular rhythm.     Heart sounds: No murmur heard. Pulmonary:     Effort: Pulmonary effort is normal. No respiratory distress.     Breath sounds: Rhonchi and rales present. No wheezing.  Chest:     Chest wall: No tenderness.  Abdominal:     General: Abdomen is flat.     Palpations: Abdomen is soft.     Tenderness: There is no abdominal tenderness. There is no right CVA tenderness, left CVA tenderness, guarding or rebound.  Musculoskeletal:        General: No swelling or tenderness.     Cervical back: Neck supple. No tenderness.     Right lower leg: Edema present.     Left lower leg: Edema present.  Skin:    General: Skin is warm and dry.     Capillary Refill: Capillary refill takes  less than 2 seconds.     Findings: No erythema.  Neurological:     General: No focal deficit present.     Mental Status: He is alert.  Psychiatric:        Mood and Affect: Mood normal.     ED Results / Procedures / Treatments   Labs (all labs ordered are listed, but only abnormal results are displayed) Labs Reviewed  CBC WITH DIFFERENTIAL/PLATELET - Abnormal; Notable for the following components:      Result Value   RBC 3.65 (*)    Hemoglobin 10.9 (*)    HCT 34.0 (*)    RDW 17.1 (*)    Monocytes Absolute  1.1 (*)    Eosinophils Absolute 0.7 (*)    All other components within normal limits  BASIC METABOLIC PANEL - Abnormal; Notable for the following components:   Glucose, Bld 104 (*)    BUN 54 (*)    Creatinine, Ser 1.73 (*)    GFR, Estimated 36 (*)    All other components within normal limits  BRAIN NATRIURETIC PEPTIDE - Abnormal; Notable for the following components:   B Natriuretic Peptide 1,862.0 (*)    All other components within normal limits  URINALYSIS, W/ REFLEX TO CULTURE (INFECTION SUSPECTED) - Abnormal; Notable for the following components:   Color, Urine STRAW (*)    Hgb urine dipstick SMALL (*)    Leukocytes,Ua SMALL (*)    All other components within normal limits  TROPONIN I (HIGH SENSITIVITY) - Abnormal; Notable for the following components:   Troponin I (High Sensitivity) 104 (*)    All other components within normal limits  TROPONIN I (HIGH SENSITIVITY) - Abnormal; Notable for the following components:   Troponin I (High Sensitivity) 117 (*)    All other components within normal limits  URINE CULTURE  AMMONIA  TSH    EKG EKG Interpretation Date/Time:  Tuesday July 15 2023 16:20:38 EDT Ventricular Rate:  94 PR Interval:    QRS Duration:  86 QT Interval:  374 QTC Calculation: 467 R Axis:   256  Text Interpretation: Atrial fibrillation Indeterminate axis Abnormal ECG When compared with ECG of 30-Jun-2023 20:04, PREVIOUS ECG IS PRESENT when  compared to prior, overall similar appearance. No STEMI Confirmed by Theda Belfast ([id number]) on 07/15/2023 4:26:23 PM  Radiology DG Chest 2 View  Result Date: 07/15/2023 CLINICAL DATA:  Shortness of breath EXAM: CHEST - 2 VIEW COMPARISON:  X-ray 06/30/2023 FINDINGS: Enlarged cardiopericardial silhouette. Enlarged pulmonary artery centrally. Small pleural effusions with some adjacent opacities. Decreasing from previous. No pneumothorax calcified aorta. Degenerative changes of the spine on lateral view. IMPRESSION: Decreasing pleural effusions and adjacent opacities. Significant residual. Enlarged heart with enlarged central pulmonary vessels. Electronically Signed   By: Karen Kays M.D.   On: 07/15/2023 14:00    Procedures Procedures    CRITICAL CARE Performed by: Canary Brim Dolores Ewing Total critical care time: 30 minutes Critical care time was exclusive of separately billable procedures and treating other patients. Critical care was necessary to treat or prevent imminent or life-threatening deterioration. Critical care was time spent personally by me on the following activities: development of treatment plan with patient and/or surrogate as well as nursing, discussions with consultants, evaluation of patient's response to treatment, examination of patient, obtaining history from patient or surrogate, ordering and performing treatments and interventions, ordering and review of laboratory studies, ordering and review of radiographic studies, pulse oximetry and re-evaluation of patient's condition.  Medications Ordered in ED Medications  furosemide (LASIX) injection 10 mg (has no administration in time range)    ED Course/ Medical Decision Making/ A&P                                 Medical Decision Making Amount and/or Complexity of Data Reviewed Labs: ordered.  Risk Decision regarding hospitalization.    YITZCHOK OVITT is a 89 y.o. male with a past medical history significant for  hypertension, hyperlipidemia, CAD status post MI, previous SVT, CHF, GERD, hypercholesterolemia, CKD, BPH, kidney stones, anxiety, sleep apnea, previous SVT, diverticulosis, and osteoarthritis who presents from [facility] for further  evaluation of edema, shortness of breath, and elevated BNP.  Patient is coming by daughter who is also providing information.  According to them, patient was seen about 2 weeks ago for shortness of breath and was found to have fluid overload.  He was given diuresis and was discharged back to his facility.  Facility reportedly is concerned about him and says he is more fatigued and still short of breath.  He is having edema that is persistent and family reports that he is more somnolent than normal with some altered mental status.  There was no reported falls or injuries and patient is denying any pain at this time.  He does say that he gets more fatigued and tired but is currently denies shortness of breath.  Oxygen saturations were in the upper 80s at about 88% when I was talking with patient.  He does not take oxygen normally although it sounds like he transiently did after his recent visit.  Paperwork did, the patient says that he was started on torsemide that he has been on and the facility did some blood work and is concerned that his BNP is elevated and his kidney function was rising.  Patient otherwise is not having new cough or congestion and is denying any chest pain or abdominal pain.  He otherwise just feels fatigued and falls asleep quickly when not talking with me.  He is hard of hearing.  On exam, he does have rales and some faint rhonchi.  Chest is nontender.  Abdomen nontender.  Did not appreciate a loud murmur.  He has some edema in legs the family reports that still persistent.  He is moving all extremities however.  Workup began to return, and patient does have some persistent abnormalities.  BNP is elevated over 1800.  Troponin has now risen just over 100.   If still in A-fib.  CBC shows mild anemia but no reported bleeding.  No elevation white blood cell count.  Metabolic panel shows creatinine has risen likely due to the diuretics.  With this fatigue and altered status, will get TSH and ammonia.  He did report some dysuria so we will get urinalysis.  We will place him on 2 L nasal cannula for this new hypoxia.  Given the kidney function worsening, altered mental status, transient hypoxia, and worsening fatigue, I do feel that he needs admission for diuresis and renal monitoring.  Will call for admission for further management.  Troponin rose slightly.  Urinalysis does not show UTI.        Final Clinical Impression(s) / ED Diagnoses Final diagnoses:  Hypoxia  Shortness of breath  Fatigue, unspecified type  Hypervolemia, unspecified hypervolemia type     Clinical Impression: 1. Hypoxia   2. Shortness of breath   3. Fatigue, unspecified type   4. Hypervolemia, unspecified hypervolemia type     Disposition: Admit  This note was prepared with assistance of Dragon voice recognition software. Occasional wrong-word or sound-a-like substitutions may have occurred due to the inherent limitations of voice recognition software.      Kavir Savoca, Canary Brim, MD 07/15/23 [phone]

## 2023-07-16 ENCOUNTER — Inpatient Hospital Stay (HOSPITAL_COMMUNITY): Payer: Medicare Other

## 2023-07-16 DIAGNOSIS — I5021 Acute systolic (congestive) heart failure: Secondary | ICD-10-CM | POA: Diagnosis not present

## 2023-07-16 DIAGNOSIS — J9621 Acute and chronic respiratory failure with hypoxia: Secondary | ICD-10-CM | POA: Diagnosis not present

## 2023-07-16 DIAGNOSIS — M7989 Other specified soft tissue disorders: Secondary | ICD-10-CM

## 2023-07-16 LAB — ECHOCARDIOGRAM COMPLETE
Area-P 1/2: 6.96 cm2
Calc EF: 36.8 %
Height: 65 in
MV M vel: 4.2 m/s
MV Peak grad: 70.6 mmHg
Radius: 0.45 cm
S' Lateral: 4.5 cm
Single Plane A2C EF: 35 %
Single Plane A4C EF: 36.1 %
Weight: 2398.6 oz

## 2023-07-16 MED ORDER — FUROSEMIDE 10 MG/ML IJ SOLN
20.0000 mg | Freq: Two times a day (BID) | INTRAMUSCULAR | Status: DC
  Administered 2023-07-16 – 2023-07-17 (×2): 20 mg via INTRAVENOUS
  Filled 2023-07-16 (×2): qty 2

## 2023-07-16 MED ORDER — PERFLUTREN LIPID MICROSPHERE
1.0000 mL | INTRAVENOUS | Status: AC | PRN
  Administered 2023-07-16: 2 mL via INTRAVENOUS

## 2023-07-16 NOTE — ED Notes (Signed)
Pt asleep since we came in at start of shift.  Pt did not wake during bladder scan per Tech.  Pt has 100 cc in urine bag at this time.

## 2023-07-16 NOTE — Progress Notes (Signed)
Left lower extremity venous duplex has been completed. Preliminary results can be found in CV Proc through chart review.   07/16/23 9:28 AM Olen Cordial RVT

## 2023-07-16 NOTE — Progress Notes (Signed)
  Echocardiogram 2D Echocardiogram has been performed.  Milda Smart 07/16/2023, 2:33 PM

## 2023-07-16 NOTE — ED Notes (Signed)
Pt was given breakfast tray 

## 2023-07-16 NOTE — Progress Notes (Addendum)
PROGRESS NOTE    [name]  EXB:[id number] DOB: January 15, 1925 DOA: 07/15/2023 PCP: [name]    Brief Narrative:  [name] is a 89 y.o. male with medical history significant for history of coronary artery disease and stents, history of paroxysmal atrial fibrillation on Eliquis, CKD, pleural effusions, and severe dementia who was sent in from the nursing home because he has been sleeping a lot more lethargic which is not his baseline.  At baseline he "races" around the nursing facility in his wheelchair and he can stand up and go to the bathroom independently.  The patient does have dementia but when I ask he denies any trouble breathing or chest pain. The emergency department his O2 sats were in the mid 80s on room air.  Once he was put on oxygen he was much less lethargic as a matter fact he got pretty agitated in the ED.  He is being admitted for hypoxia and possible CHF exacerbation. Admitted from SNF [facility])  Assessment and Plan:  Acute resp failure with Hypoxia with known systolic CHF (last echo was 20-25%) -the patient may need to keep oxygen after discharge.  His chest x-ray reveals improved CHF and smaller pleural effusions than last xray.  - Will Continue gentle diuresis-- have doubled the amount of lasix from admitter - d-dimer elevated but so is Cr so will hold on CTA and get lower ext dopplers -needs  update echo (hold entresto)  Hypothyroidism ? -free t4 pending -hold on synthroid -TSH has been elevated in past  History of paroxysmal atrial fibrillation  -continue Eliquis   Dementia with sundowning and agitation in the ED -the daughter generally stays with him in the hospital because he does have a lot of trouble with sundowning.  -delirium   CKD IIIb  -monitor creatinine with diuresis  Hematuria -appear foley placed in ER- no documentation as to why -no clots in foley -monitor-- if worsens may need to hold eliquis   DVT  prophylaxis: apixaban (ELIQUIS) tablet 2.5 mg Start: 07/15/23 2200 apixaban (ELIQUIS) tablet 2.5 mg    Code Status: DNR Family Communication: called son and updated  Disposition Plan:  Level of care: Telemetry Status is: Inpatient Remains inpatient appropriate     Consultants:  none   Subjective: Sleepy   Objective: Vitals:   07/16/23 0000 07/16/23 0200 07/16/23 0600 07/16/23 0636  BP: (!) 143/113  114/84   Pulse: 92  98   Resp: (!) 22  15   Temp:  98.2 F (36.8 C)  97.7 F (36.5 C)  TempSrc:    Oral  SpO2: (!) 89%  99%   Weight:      Height:       No intake or output data in the 24 hours ending 07/16/23 0853 Filed Weights   07/15/23 1310  Weight: 68 kg    Examination:   General: Appearance:    elderly male who appear comfortable     Lungs:     Diminished, respirations unlabored  Heart:    Normal heart rate.    MS:   All extremities are intact. + edema   Neurologic:   sleepy       Data Reviewed: I have personally reviewed following labs and imaging studies  CBC: Recent Labs  Lab 07/15/23 1432 07/16/23 0631  WBC 7.4 10.0  NEUTROABS 4.3  --   HGB 10.9* 11.2*  HCT 34.0* 34.0*  MCV 93.2 91.9  PLT 173 162   Basic Metabolic Panel:  Recent Labs  Lab 07/15/23 1432 07/16/23 0631  NA 135 135  K 4.0 4.5  CL 101 100  CO2 24 23  GLUCOSE 104* 126*  BUN 54* 59*  CREATININE 1.73* 2.03*  CALCIUM 9.0 9.4  MG  --  1.7   GFR: Estimated Creatinine Clearance: 18.9 mL/min (A) (by C-G formula based on SCr of 2.03 mg/dL (H)). Liver Function Tests: No results for input(s): "AST", "ALT", "ALKPHOS", "BILITOT", "PROT", "ALBUMIN" in the last 168 hours. No results for input(s): "LIPASE", "AMYLASE" in the last 168 hours. Recent Labs  Lab 07/15/23 1757  AMMONIA 33   Coagulation Profile: No results for input(s): "INR", "PROTIME" in the last 168 hours. Cardiac Enzymes: No results for input(s): "CKTOTAL", "CKMB", "CKMBINDEX", "TROPONINI" in the last 168  hours. BNP (last 3 results) No results for input(s): "PROBNP" in the last 8760 hours. HbA1C: No results for input(s): "HGBA1C" in the last 72 hours. CBG: No results for input(s): "GLUCAP" in the last 168 hours. Lipid Profile: No results for input(s): "CHOL", "HDL", "LDLCALC", "TRIG", "CHOLHDL", "LDLDIRECT" in the last 72 hours. Thyroid Function Tests: Recent Labs    07/15/23 1757  TSH 19.276*   Anemia Panel: No results for input(s): "VITAMINB12", "FOLATE", "FERRITIN", "TIBC", "IRON", "RETICCTPCT" in the last 72 hours. Sepsis Labs: No results for input(s): "PROCALCITON", "LATICACIDVEN" in the last 168 hours.  No results found for this or any previous visit (from the past 240 hour(s)).       Radiology Studies: DG Chest 2 View  Result Date: 07/15/2023 CLINICAL DATA:  Shortness of breath EXAM: CHEST - 2 VIEW COMPARISON:  X-ray 06/30/2023 FINDINGS: Enlarged cardiopericardial silhouette. Enlarged pulmonary artery centrally. Small pleural effusions with some adjacent opacities. Decreasing from previous. No pneumothorax calcified aorta. Degenerative changes of the spine on lateral view. IMPRESSION: Decreasing pleural effusions and adjacent opacities. Significant residual. Enlarged heart with enlarged central pulmonary vessels. Electronically Signed   By: [name] M.D.   On: 07/15/2023 14:00        Scheduled Meds:  apixaban  2.5 mg Oral BID   calcium carbonate  1 tablet Oral Daily   donepezil  10 mg Oral QHS   escitalopram  5 mg Oral Daily   furosemide  10 mg Intravenous BID   gabapentin  100 mg Oral Q48H   levothyroxine  25 mcg Oral Q0600   lidocaine  1 patch Transdermal Q24H   metoprolol succinate  25 mg Oral Daily   psyllium  1 packet Oral Daily   sacubitril-valsartan  1 tablet Oral BID   senna  1 tablet Oral BID   simvastatin  10 mg Oral QHS   tamsulosin  0.4 mg Oral Daily   Continuous Infusions:   LOS: 1 day    Time spent: 45 minutes spent on chart review,  discussion with nursing staff, consultants, updating family and interview/physical exam; more than 50% of that time was spent in counseling and/or coordination of care.    Charles Art, DO [facility] Available via Epic secure chat 7am-7pm After these hours, please refer to coverage provider listed on [email or web address] 07/16/2023, 8:53 AM

## 2023-07-17 DIAGNOSIS — I2510 Atherosclerotic heart disease of native coronary artery without angina pectoris: Secondary | ICD-10-CM | POA: Diagnosis not present

## 2023-07-17 DIAGNOSIS — I110 Hypertensive heart disease with heart failure: Secondary | ICD-10-CM | POA: Diagnosis not present

## 2023-07-17 DIAGNOSIS — I4821 Permanent atrial fibrillation: Secondary | ICD-10-CM | POA: Diagnosis not present

## 2023-07-17 DIAGNOSIS — J9621 Acute and chronic respiratory failure with hypoxia: Secondary | ICD-10-CM | POA: Diagnosis not present

## 2023-07-17 DIAGNOSIS — I5023 Acute on chronic systolic (congestive) heart failure: Secondary | ICD-10-CM

## 2023-07-17 MED ORDER — CHLORHEXIDINE GLUCONATE CLOTH 2 % EX PADS
6.0000 | MEDICATED_PAD | Freq: Every day | CUTANEOUS | Status: DC
  Administered 2023-07-17 – 2023-07-25 (×8): 6 via TOPICAL

## 2023-07-17 NOTE — Plan of Care (Signed)

## 2023-07-17 NOTE — Hospital Course (Addendum)
89 y.o.m w/ significant for history of CAD- W/ stents, PAF on Eliquis, CKD, pleural effusions, and severe dementia who was sent in from the [facility] nursing home because he has been sleeping a lot more lethargic which is not his baseline.  At baseline he "races" around the nursing facility in his wheelchair and he can stand up and go to the bathroom independently.  In ED: O2 sats were in the mid 80s on room air.  Once he was put on oxygen he was much less lethargic as a matter fact he got pretty agitated in the ED. Labs significant for elevated BNP 1862, troponin  67> 104>117, creatinine at 1.7>2.0, CBC stable without leukocytosis.

## 2023-07-17 NOTE — Consult Note (Signed)
Cardiology Consultation   Patient ID: [name] MRN: [id number]; DOB: 1925-08-31  Admit date: 07/15/2023 Date of Consult: 07/17/2023  PCP:  [facility] Providers Cardiologist:  Norman Herrlich, MD        Patient Profile:   [name] is a 89 y.o. male with a hx of CAD, hypertension, permanent atrial fibrillation, chronic systolic heart failure, CKD IIIb, dementia who is being seen 07/17/2023 for the evaluation of abnormal echocardiogram, CHF, elevated troponin at the request of Dr. Jonathon Bellows.  History of Present Illness:   [name] presented to the ED from SNF for evaluation of increased lethargy. His recent baseline at facility reported as very active, mobilizing via wheelchair with some limited ambulation. In the ED, patient found with O2 saturation mid 80s. Upon being placed on oxygen, patient reportedly became more alert and active. Initial labs in the ED noted HGB 10.9, creatinine 1.73, BNP 1862, hs troponin 104->117. UA with small leuks. TSH 19.276 with free T4 0.90. T3 pending. D-dimer 2.49.  Of note, patient recently seen in the ED on 06/30/23 for evaluation of "fluid retention." Per patient's facility, he had gained 5 pounds over 2 days despite compliance with Torsemide. Patient had good response to IV diuresis and was discharged from the ED.   On exam, patient with limited verbal participation, appears sleepy.   Regarding cardiac history, patient has been followed by Dr. Dulce Sellar. He was last seen in April of 2022. LVEF at that time ~30% and patient was continued on a regimen of beta blocker, Entresto, and loop diuretic.   Past Medical History:  Diagnosis Date   Anaphylactic reaction 06/02/2016   APPENDECTOMY, HX OF 02/25/2008   Qualifier: Diagnosis of  By: Genelle Gather CMA, [place]     Bilateral carpal tunnel syndrome 08/07/2016   Bilateral hearing loss due to cerumen impaction 04/02/2018   Bilateral impacted cerumen 10/27/2018    Bilateral leg weakness 01/25/2019   BPH (benign prostatic hyperplasia)    BPH with obstruction/lower urinary tract symptoms 02/25/2008   Qualifier: Diagnosis of  By: Genelle Gather CMA, [place]     CARPAL TUNNEL RELEASE, RIGHT, HX OF 02/25/2008   Qualifier: Diagnosis of  By: Genelle Gather CMA, [place]     Chronic sinus bradycardia    Chronic systolic (congestive) heart failure (HCC)    CKD (chronic kidney disease) stage 3, GFR 30-59 ml/min (HCC) 06/02/2016   CKD (chronic kidney disease), stage III (HCC)    Coronary artery disease    Coronary artery disease involving native coronary artery of native heart with angina pectoris (HCC) 02/25/2008   Qualifier: Diagnosis of  By: Genelle Gather CMA, [place]     Cough 11/03/2008   Qualifier: Diagnosis of  By: Debby Bud MD, Rosalyn Gess    DEGENERATIVE JOINT DISEASE, CERVICAL SPINE 02/25/2008   Qualifier: Diagnosis of  By: Genelle Gather CMA, [place]     Diverticulosis    DIVERTICULOSIS, COLON 02/25/2008   Qualifier: Diagnosis of  By: Genelle Gather CMA, [place]     GERD 02/25/2008   Qualifier: Diagnosis of  By: Genelle Gather CMA, [place]     GERD (gastroesophageal reflux disease)    Gross hematuria 01/28/2017   Hip dislocation 01/31/2012   HLD (hyperlipidemia) 02/25/2008   Qualifier: Diagnosis of  By: Genelle Gather CMA, [place]     Hypercholesterolemia    HYPERGLYCEMIA 02/25/2008   Qualifier: History of  By: Genelle Gather CMA, [place]     Hypertension    Hypertensive heart disease with heart failure (HCC) 02/25/2008  Qualifier: Diagnosis of  By: Genelle Gather CMA, [place]     IBS (irritable bowel syndrome)    Ischemic cardiomyopathy 09/21/2018   Kidney stone    Left hip pain    Chronic left hip discomfort   Lipoma of back 08/06/2016   Mechanical instability of hip prosthesis (HCC) 09/06/2015   MYOCARDIAL INFARCTION 02/25/2008   Qualifier: History of  By: Genelle Gather CMA, [place]     Myocardial infarction (HCC) 2001   Previous anterolateral myocardial infarction with presistent ST-T wave changes   NEPHROLITHIASIS, HX OF  02/25/2008   Qualifier: Diagnosis of  By: Genelle Gather CMA, [place]     Osteoarthritis    Paroxysmal atrial fibrillation (HCC) 09/21/2018   PERCUTANEOUS TRANSLUMINAL CORONARY ANGIOPLASTY, HX OF 02/25/2008   Qualifier: Diagnosis of  By: Genelle Gather CMA, [place]     Primary osteoarthritis of both first carpometacarpal joints 08/07/2016   Primary osteoarthritis of both hands 08/07/2016   Renal calculus 06/30/2013   Sinus bradycardia 11/14/2014   Sleep apnea    uses cpap   SVT (supraventricular tachycardia)    TRIGGER FINGER 02/25/2008   Annotation: right index finger Qualifier: History of  By: Genelle Gather CMA, [place]     Tubular adenoma    TUBULOVILLOUS ADENOMA, COLON 02/25/2008   Qualifier: History of  By: Genelle Gather CMA, [place]     Unspecified personal history presenting hazards to health 02/25/2008   Centricity Description: POLYPECTOMY, HX OF Qualifier: Diagnosis of  By: Genelle Gather CMA, [place]   Centricity Description: PROSTATECTOMY, TRANSURETHRAL, HX OF Qualifier: Diagnosis of  By: Genelle Gather CMA, [place]      Past Surgical History:  Procedure Laterality Date   ANGIOPLASTY  2002   with stent and angioplasty in 2003   APPENDECTOMY     BACK SURGERY     CARDIAC CATHETERIZATION  05/07/2002   Ejection fraction 20%   CARPAL TUNNEL RELEASE Left 08/29/2016   Procedure: LEFT CARPAL TUNNEL RELEASE;  Surgeon: Cindee Salt, MD;  Location: [facility];  Service: Orthopedics;  Laterality: Left;  REG/FAB   FINGER SURGERY     HIP CLOSED REDUCTION  01/31/2012   Procedure: CLOSED MANIPULATION HIP;  Surgeon: Loanne Drilling, MD;  Location: [facility];  Service: Orthopedics;  Laterality: Right;  closed reduction right dislocated hip   HIP CLOSED REDUCTION  10/10/2012   Procedure: CLOSED MANIPULATION HIP;  Surgeon: Eugenia Mcalpine, MD;  Location: [facility];  Service: Orthopedics;  Laterality: Right;   HIP CLOSED REDUCTION Right 03/17/2015   Procedure: CLOSED REDUCTION HIP;  Surgeon: Samson Frederic, MD;  Location: [facility];  Service:  Orthopedics;  Laterality: Right;   JOINT REPLACEMENT     BILATERAL HIP REPLACEMENTS   TONSILLECTOMY     TOTAL HIP REVISION Right 09/06/2015   Procedure: RIGHT TOTAL HIP ACETABULAR LINER REVISION (CONSTRAINED LINER);  Surgeon: Ollen Gross, MD;  Location: [facility];  Service: Orthopedics;  Laterality: Right;   TRANSURETHRAL RESECTION OF PROSTATE       Home Medications:  Prior to Admission medications   Medication Sig Start Date End Date Taking? Authorizing Provider  calcium carbonate (TUMS - DOSED IN MG ELEMENTAL CALCIUM) 500 MG chewable tablet Chew 1 tablet by mouth daily.   Yes [provider]  cholecalciferol (VITAMIN D3) 25 MCG (1000 UNIT) tablet Take 2,000 Units by mouth daily.   Yes [provider]  diclofenac Sodium (VOLTAREN ARTHRITIS PAIN) 1 % GEL Apply 2 g topically 2 (two) times daily.   Yes [provider]  donepezil (ARICEPT) 10 MG tablet  Take 10 mg by mouth at bedtime.   Yes [provider]  ELIQUIS 2.5 MG TABS tablet TAKE 1 TABLET BY MOUTH TWICE A DAY Patient taking differently: Take 2.5 mg by mouth 2 (two) times daily. 08/15/20  Yes Baldo Daub, MD  escitalopram (LEXAPRO) 5 MG tablet Take 5 mg by mouth daily.   Yes [provider]  gabapentin (NEURONTIN) 100 MG capsule Take 100 mg by mouth every other day.   Yes [provider]  lidocaine (HM LIDOCAINE PATCH) 4 % Place 1 patch onto the skin daily. Apply to hip and low back   Yes [provider]  Menthol, Topical Analgesic, (BIOFREEZE COOL THE PAIN) 4 % GEL Apply 1 application  topically 2 (two) times daily.   Yes [provider]  metoprolol succinate (TOPROL-XL) 25 MG 24 hr tablet Take 25 mg by mouth daily. 03/09/21  Yes [provider]  oxyCODONE (ROXICODONE) 5 MG immediate release tablet Take 1 tablet (5 mg total) by mouth every 6 (six) hours as needed for severe pain. Patient taking differently: Take 2.5 mg by mouth every 8 (eight) hours as  needed for severe pain. 04/10/21  Yes Adhikari, Willia Craze, MD  Psyllium (METAMUCIL 4 IN 1 FIBER) 43 % POWD Take 1 Dose by mouth daily. 5 ml powder with 8-10 ounces of water and prune juice   Yes [provider]  sacubitril-valsartan (ENTRESTO) 49-51 MG Take 1 tablet by mouth 2 (two) times daily. 06/14/20  Yes Baldo Daub, MD  senna (SENOKOT) 8.6 MG TABS tablet Take 1 tablet (8.6 mg total) by mouth 2 (two) times daily. 04/10/21  Yes Burnadette Pop, MD  simvastatin (ZOCOR) 10 MG tablet Take 10 mg by mouth at bedtime.   Yes [provider]  tamsulosin (FLOMAX) 0.4 MG CAPS capsule Take 0.4 mg by mouth daily. 10/11/19  Yes [provider]  torsemide (DEMADEX) 20 MG tablet Take 1 tablet (20 mg total) by mouth daily. Patient taking differently: Take 20 mg by mouth 2 (two) times daily. 04/13/20  Yes Georgeanna Lea, MD  LORazepam (ATIVAN) 0.5 MG tablet Take 1 tablet (0.5 mg total) by mouth 2 (two) times daily. Patient not taking: Reported on 07/15/2023 04/10/21   Burnadette Pop, MD  polyethylene glycol (MIRALAX / GLYCOLAX) 17 g packet Take 17 g by mouth daily as needed for mild constipation. Patient not taking: Reported on 07/15/2023 04/10/21   Burnadette Pop, MD    Inpatient Medications: Scheduled Meds:  calcium carbonate  1 tablet Oral Daily   Chlorhexidine Gluconate Cloth  6 each Topical Daily   donepezil  10 mg Oral QHS   escitalopram  5 mg Oral Daily   gabapentin  100 mg Oral Q48H   levothyroxine  25 mcg Oral Q0600   lidocaine  1 patch Transdermal Q24H   metoprolol succinate  25 mg Oral Daily   psyllium  1 packet Oral Daily   senna  1 tablet Oral BID   simvastatin  10 mg Oral QHS   tamsulosin  0.4 mg Oral Daily   Continuous Infusions:  PRN Meds: melatonin, ondansetron **OR** ondansetron (ZOFRAN) IV, oxyCODONE, sorbitol  Allergies:    Allergies  Allergen Reactions   Beef-Derived Products Hives    Severe hives, nausea, throat swelling   Acetaminophen Other  (See Comments)    urticaria urticaria   Alpha-Gal     Per mar   Amiodarone Other (See Comments)    Unknown  Affected my breathing   Prilosec [  Omeprazole]     Diarrhea, stomach cramps   Prednisone Palpitations    REACTION: causes tachycardia, increase BP    Social History:   Social History   Socioeconomic History   Marital status: Married    Spouse name: Not on file   Number of children: Not on file   Years of education: Not on file   Highest education level: Not on file  Occupational History   Not on file  Tobacco Use   Smoking status: Never   Smokeless tobacco: Never  Vaping Use   Vaping status: Never Used  Substance and Sexual Activity   Alcohol use: No   Drug use: No   Sexual activity: Never  Other Topics Concern   Not on file  Social History Narrative   Not on file   Social Determinants of Health   Financial Resource Strain: Not on file  Food Insecurity: Not on file  Transportation Needs: Not on file  Physical Activity: Not on file  Stress: Not on file  Social Connections: Not on file  Intimate Partner Violence: Not on file    Family History:    Family History  Problem Relation Age of Onset   Heart attack Father    Stroke Father    Stroke Mother    Alzheimer's disease Mother    Cancer Sister      ROS:  Please see the history of present illness.   All other ROS reviewed and negative.     Physical Exam/Data:   Vitals:   07/16/23 1835 07/16/23 2232 07/17/23 0234 07/17/23 0817  BP: 95/66 100/64 108/70 98/67  Pulse: 95 86 93 62  Resp: 16 17 19 16   Temp: 98.2 F (36.8 C) (!) 97.5 F (36.4 C) 97.7 F (36.5 C) 97.6 F (36.4 C)  TempSrc: Oral Oral Oral Oral  SpO2: 96% 98% 100% 100%  Weight:      Height:        Intake/Output Summary (Last 24 hours) at 07/17/2023 1118 Last data filed at 07/17/2023 1100 Gross per 24 hour  Intake 600 ml  Output 2600 ml  Net -2000 ml      07/15/2023    1:10 PM 06/30/2023    9:54 PM 08/05/2022    1:37 PM   Last 3 Weights  Weight (lbs) 149 lb 14.6 oz 149 lb 14.6 oz 150 lb  Weight (kg) 68 kg 68 kg 68.04 kg     Body mass index is 24.95 kg/m.  General:  Frail and chronically ill appearing. Sleepy on exam HEENT: normal Neck: no JVD Vascular: No carotid bruits; Distal pulses 2+ bilaterally Cardiac:  normal S1, S2; irregularly irregular; no murmur  Lungs:  clear to auscultation bilaterally, no wheezing, rhonchi or rales. Abd: soft, nontender, no hepatomegaly  Ext: 1+ lower extremity left side, trace right side. Right elbow with hematoma and bleeding under tegaderm dressing, possible abrasion? Musculoskeletal:  No deformities, generalized weakness noted Skin: warm and dry  Neuro:  no focal abnormalities noted Psych:  Normal affect   EKG:  The EKG was personally reviewed and demonstrates:  atrial fibrillation, ventricular rate in the 90s. Telemetry:  Telemetry was personally reviewed and demonstrates:  persistent afib, rate controlled with ventricular rates in the 70s-90s.  Relevant CV Studies:  Last echo from 2016 reports "moderate concentric LVH with anteroapical and septal akinesis. Estimated EF 30%. Moderate left atrial enlargement. Mild right atrial enlargement. Mild to moderate aortic regurgitation. Mild to moderate mitral regurgitation. Moderate tricuspid regurgitation with moderate  pulmonary hypertension. Moderate pulmonic regurgitation."  07/16/23 TTE  IMPRESSIONS     1. Left ventricular ejection fraction, by estimation, is 20 to 25%. The  left ventricle has severely decreased function. The left ventricle  demonstrates global hypokinesis. Left ventricular diastolic parameters are  consistent with Grade III diastolic  dysfunction (restrictive).   2. Right ventricular systolic function is moderately reduced. The right  ventricular size is moderately enlarged. There is severely elevated  pulmonary artery systolic pressure. The estimated right ventricular  systolic pressure is 68.0  mmHg.   3. Left atrial size was severely dilated.   4. Right atrial size was severely dilated.   5. The mitral valve is normal in structure. Moderate mitral valve  regurgitation. No evidence of mitral stenosis.   6. Tricuspid valve regurgitation is severe.   7. The aortic valve is calcified. Aortic valve regurgitation is mild. No  aortic stenosis is present.   8. The inferior vena cava is normal in size with greater than 50%  respiratory variability, suggesting right atrial pressure of 3 mmHg.   FINDINGS   Left Ventricle: Left ventricular ejection fraction, by estimation, is 20  to 25%. The left ventricle has severely decreased function. The left  ventricle demonstrates global hypokinesis. Definity contrast agent was  given IV to delineate the left  ventricular endocardial borders. The left ventricular internal cavity size  was normal in size. There is no left ventricular hypertrophy. Left  ventricular diastolic parameters are consistent with Grade III diastolic  dysfunction (restrictive).     LV Wall Scoring:  The apical septal segment, apical inferior segment, and apex are akinetic.   Right Ventricle: The right ventricular size is moderately enlarged. No  increase in right ventricular wall thickness. Right ventricular systolic  function is moderately reduced. There is severely elevated pulmonary  artery systolic pressure. The tricuspid  regurgitant velocity is 4.03 m/s, and with an assumed right atrial  pressure of 3 mmHg, the estimated right ventricular systolic pressure is  68.0 mmHg.   Left Atrium: Left atrial size was severely dilated.   Right Atrium: Right atrial size was severely dilated.   Pericardium: There is no evidence of pericardial effusion.   Mitral Valve: The mitral valve is normal in structure. Moderate mitral  valve regurgitation. No evidence of mitral valve stenosis.   Tricuspid Valve: The tricuspid valve is normal in structure. Tricuspid  valve  regurgitation is severe. No evidence of tricuspid stenosis.   Aortic Valve: The aortic valve is calcified. Aortic valve regurgitation is  mild. No aortic stenosis is present.   Pulmonic Valve: The pulmonic valve was normal in structure. Pulmonic valve  regurgitation is mild. No evidence of pulmonic stenosis.   Aorta: The aortic root is normal in size and structure.   Venous: The inferior vena cava is normal in size with greater than 50%  respiratory variability, suggesting right atrial pressure of 3 mmHg.   IAS/Shunts: No atrial level shunt detected by color flow Doppler.   Laboratory Data:  High Sensitivity Troponin:   Recent Labs  Lab 06/30/23 2036 06/30/23 2243 07/15/23 1432 07/15/23 1652  TROPONINIHS 72* 67* 104* 117*     Chemistry Recent Labs  Lab 07/15/23 1432 07/16/23 0631 07/17/23 0415  NA 135 135 135  K 4.0 4.5 4.4  CL 101 100 102  CO2 24 23 21*  GLUCOSE 104* 126* 86  BUN 54* 59* 65*  CREATININE 1.73* 2.03* 1.96*  CALCIUM 9.0 9.4 8.9  MG  --  1.7  --  GFRNONAA 36* 30* 31*  ANIONGAP 10 12 12     No results for input(s): "PROT", "ALBUMIN", "AST", "ALT", "ALKPHOS", "BILITOT" in the last 168 hours. Lipids No results for input(s): "CHOL", "TRIG", "HDL", "LABVLDL", "LDLCALC", "CHOLHDL" in the last 168 hours.  Hematology Recent Labs  Lab 07/15/23 1432 07/16/23 0631 07/17/23 0415  WBC 7.4 10.0 9.7  RBC 3.65* 3.70* 3.65*  HGB 10.9* 11.2* 10.8*  HCT 34.0* 34.0* 35.4*  MCV 93.2 91.9 97.0  MCH 29.9 30.3 29.6  MCHC 32.1 32.9 30.5  RDW 17.1* 17.1* 17.4*  PLT 173 162 139*   Thyroid  Recent Labs  Lab 07/15/23 1757 07/16/23 0631  TSH 19.276*  --   FREET4  --  0.90    BNP Recent Labs  Lab 07/15/23 1432  BNP 1,862.0*    DDimer  Recent Labs  Lab 07/16/23 0631  DDIMER 2.49*     Radiology/Studies:  VAS [place] LOWER EXTREMITY VENOUS (DVT)  Result Date: 07/16/2023  Lower Venous DVT Study Patient Name:  TERRELLE SLINGERLAND  Date of Exam:   07/16/2023  Medical Rec #: [id number]         Accession #:    [id number] Date of Birth: October 06, 1925        Patient Gender: M Patient Age:   22 years Exam Location:  [facility] Procedure:      VAS [place] LOWER EXTREMITY VENOUS (DVT) Referring Phys: CLAUDIA CLAIBORNE --------------------------------------------------------------------------------  Indications: Swelling.  Risk Factors: None identified. Limitations: Poor ultrasound/tissue interface and patient positioning. Comparison Study: No prior studies. Performing Technologist: Chanda Busing RVT  Examination Guidelines: A complete evaluation includes B-mode imaging, spectral Doppler, color Doppler, and power Doppler as needed of all accessible portions of each vessel. Bilateral testing is considered an integral part of a complete examination. Limited examinations for reoccurring indications may be performed as noted. The reflux portion of the exam is performed with the patient in reverse Trendelenburg.  +-----+---------------+---------+-----------+----------+--------------+ RIGHTCompressibilityPhasicitySpontaneityPropertiesThrombus Aging +-----+---------------+---------+-----------+----------+--------------+ CFV  Full           Yes      Yes                                 +-----+---------------+---------+-----------+----------+--------------+   +---------+---------------+---------+-----------+----------+--------------+ LEFT     CompressibilityPhasicitySpontaneityPropertiesThrombus Aging +---------+---------------+---------+-----------+----------+--------------+ CFV      Full           Yes      Yes                                 +---------+---------------+---------+-----------+----------+--------------+ SFJ      Full                                                        +---------+---------------+---------+-----------+----------+--------------+ FV Prox  Full                                                         +---------+---------------+---------+-----------+----------+--------------+ FV Mid   Full                                                        +---------+---------------+---------+-----------+----------+--------------+  FV DistalFull           Yes      Yes                                 +---------+---------------+---------+-----------+----------+--------------+ PFV      Full                                                        +---------+---------------+---------+-----------+----------+--------------+ POP      Full           Yes      Yes                                 +---------+---------------+---------+-----------+----------+--------------+ PTV      Full                                                        +---------+---------------+---------+-----------+----------+--------------+ PERO     Full                                                        +---------+---------------+---------+-----------+----------+--------------+    Summary: RIGHT: - No evidence of common femoral vein obstruction.  LEFT: - There is no evidence of deep vein thrombosis in the lower extremity. However, portions of this examination were limited- see technologist comments above.  - No cystic structure found in the popliteal fossa.  *See table(s) above for measurements and observations. Electronically signed by Heath Lark on 07/16/2023 at 5:27:54 PM.    Final    ECHOCARDIOGRAM COMPLETE  Result Date: 07/16/2023    ECHOCARDIOGRAM REPORT   Patient Name:   EZEQUIAS WALTHER Date of Exam: 07/16/2023 Medical Rec #:  [id number]        Height:       65.0 in Accession #:    [id number]       Weight:       149.9 lb Date of Birth:  [birth year]       BSA:          1.750 m Patient Age:    89 years         BP:           100/69 mmHg Patient Gender: M                HR:           85 bpm. Exam Location:  Inpatient Procedure: 2D Echo, Cardiac Doppler, Color Doppler and Intracardiac            Opacification  Agent REPORT CONTAINS CRITICAL RESULT Indications:    CHF Acute Systolic  History:        Patient has no prior history of Echocardiogram examinations.                 CAD; Arrythmias:Atrial Fibrillation. CKD, severe dementia.  Sonographer:  Milda Smart Referring Phys: [phone] JESSICA U Benjamine Mola  Sonographer Comments: Image acquisition challenging due to respiratory motion. IMPRESSIONS  1. Left ventricular ejection fraction, by estimation, is 20 to 25%. The left ventricle has severely decreased function. The left ventricle demonstrates global hypokinesis. Left ventricular diastolic parameters are consistent with Grade III diastolic dysfunction (restrictive).  2. Right ventricular systolic function is moderately reduced. The right ventricular size is moderately enlarged. There is severely elevated pulmonary artery systolic pressure. The estimated right ventricular systolic pressure is 68.0 mmHg.  3. Left atrial size was severely dilated.  4. Right atrial size was severely dilated.  5. The mitral valve is normal in structure. Moderate mitral valve regurgitation. No evidence of mitral stenosis.  6. Tricuspid valve regurgitation is severe.  7. The aortic valve is calcified. Aortic valve regurgitation is mild. No aortic stenosis is present.  8. The inferior vena cava is normal in size with greater than 50% respiratory variability, suggesting right atrial pressure of 3 mmHg. FINDINGS  Left Ventricle: Left ventricular ejection fraction, by estimation, is 20 to 25%. The left ventricle has severely decreased function. The left ventricle demonstrates global hypokinesis. Definity contrast agent was given IV to delineate the left ventricular endocardial borders. The left ventricular internal cavity size was normal in size. There is no left ventricular hypertrophy. Left ventricular diastolic parameters are consistent with Grade III diastolic dysfunction (restrictive).  LV Wall Scoring: The apical septal segment, apical inferior  segment, and apex are akinetic. Right Ventricle: The right ventricular size is moderately enlarged. No increase in right ventricular wall thickness. Right ventricular systolic function is moderately reduced. There is severely elevated pulmonary artery systolic pressure. The tricuspid regurgitant velocity is 4.03 m/s, and with an assumed right atrial pressure of 3 mmHg, the estimated right ventricular systolic pressure is 68.0 mmHg. Left Atrium: Left atrial size was severely dilated. Right Atrium: Right atrial size was severely dilated. Pericardium: There is no evidence of pericardial effusion. Mitral Valve: The mitral valve is normal in structure. Moderate mitral valve regurgitation. No evidence of mitral valve stenosis. Tricuspid Valve: The tricuspid valve is normal in structure. Tricuspid valve regurgitation is severe. No evidence of tricuspid stenosis. Aortic Valve: The aortic valve is calcified. Aortic valve regurgitation is mild. No aortic stenosis is present. Pulmonic Valve: The pulmonic valve was normal in structure. Pulmonic valve regurgitation is mild. No evidence of pulmonic stenosis. Aorta: The aortic root is normal in size and structure. Venous: The inferior vena cava is normal in size with greater than 50% respiratory variability, suggesting right atrial pressure of 3 mmHg. IAS/Shunts: No atrial level shunt detected by color flow Doppler.  LEFT VENTRICLE PLAX 2D LVIDd:         5.30 cm      Diastology LVIDs:         4.50 cm      LV e' medial:    2.31 cm/s LV PW:         1.30 cm      LV E/e' medial:  35.6 LV IVS:        1.10 cm      LV e' lateral:   5.80 cm/s LVOT diam:     2.00 cm      LV E/e' lateral: 14.2 LV SV:         48 LV SV Index:   27 LVOT Area:     3.14 cm  LV Volumes (MOD) LV vol d, MOD A2C: 123.0 ml LV vol d, MOD A4C:  141.0 ml LV vol s, MOD A2C: 80.0 ml LV vol s, MOD A4C: 90.1 ml LV SV MOD A2C:     43.0 ml LV SV MOD A4C:     141.0 ml LV SV MOD BP:      49.3 ml RIGHT VENTRICLE RV Basal diam:   4.40 cm RV Mid diam:    3.10 cm RV S prime:     4.78 cm/s TAPSE (M-mode): 1.0 cm LEFT ATRIUM              Index        RIGHT ATRIUM           Index LA diam:        5.10 cm  2.91 cm/m   RA Area:     32.30 cm LA Vol (A2C):   128.5 ml 73.43 ml/m  RA Volume:   123.00 ml 70.28 ml/m LA Vol (A4C):   105.4 ml 60.23 ml/m LA Biplane Vol: 126.0 ml 72.00 ml/m  AORTIC VALVE LVOT Vmax:   84.10 cm/s LVOT Vmean:  59.100 cm/s LVOT VTI:    0.152 m  AORTA Ao Root diam: 3.30 cm Ao Asc diam:  3.60 cm MITRAL VALVE                  TRICUSPID VALVE MV Area (PHT): 6.96 cm       TR Peak grad:   65.0 mmHg MV Decel Time: 109 msec       TR Mean grad:   38.0 mmHg MR Peak grad:    70.6 mmHg    TR Vmax:        403.00 cm/s MR Mean grad:    51.0 mmHg    TR Vmean:       283.0 cm/s MR Vmax:         420.00 cm/s MR Vmean:        339.0 cm/s   SHUNTS MR PISA:         1.27 cm     Systemic VTI:  0.15 m MR PISA Eff ROA: 12 mm       Systemic Diam: 2.00 cm MR PISA Radius:  0.45 cm MV E velocity: 82.30 cm/s Donato Schultz MD Electronically signed by Donato Schultz MD Signature Date/Time: 07/16/2023/2:48:23 PM    Final    DG Chest 2 View  Result Date: 07/15/2023 CLINICAL DATA:  Shortness of breath EXAM: CHEST - 2 VIEW COMPARISON:  X-ray 06/30/2023 FINDINGS: Enlarged cardiopericardial silhouette. Enlarged pulmonary artery centrally. Small pleural effusions with some adjacent opacities. Decreasing from previous. No pneumothorax calcified aorta. Degenerative changes of the spine on lateral view. IMPRESSION: Decreasing pleural effusions and adjacent opacities. Significant residual. Enlarged heart with enlarged central pulmonary vessels. Electronically Signed   By: Karen Kays M.D.   On: 07/15/2023 14:00     Assessment and Plan:   Acute on chronic HFrEF  Patient with LVEF of 30% as of 2016 echo (most recent study that I can find prior to this admission), brought to the ED with concern of lethargy and found to be hypoxic. BNP elevated to 1862 though this  is down slightly from recent ED visit with hypervolemia when BNP was 1922.7. TTE this admission with LVEF 20-25% and global hypokinesis.  Unclear that hypoxia is of cardiac etiology. Although LVEF this admission down from prior study in 2016, overall I feel that imaging is relatively stable. In both studies, patient with significantly elevated RVSP ( in 2016, this admission). CXR this  admission actually shows improvement in pleural effusions as well as adjacent opacities compared with 06/30/23 study. Given patient frailty, conservative management of HF recommended. Given creatinine bump from admission and relatively euvolemic appearance, would hold further IV lasix today. Elevated BNP also reflects CKD IIIb and chronic afib, not just elevated intravascular volume. Reasonable to resume home Torsemide 20mg  BID tomorrow. Could consider adding a third 20mg  dose for weight gain >5 lbs in 24 hours.  Continue Entresto 49-51mg  BID, Metoprolol Succinate 25mg .  Avoid Spironolactone given CKD and AKI. Given concern of UTI, would also avoid SGLT2.  Persistent atrial fibrillation  Patient with longstanding afib history. Rate controlled this admission.   Given significant hematuria noted this morning (question if patient pulled out foley?), would hold Eliquis until resolved. Continue metoprolol succinate 25mg .   Hypertension  BP stable. Continue HF GDMT as above.  CAD Hyperlipidemia  Patient with limited verbal contribution to exam, is not reporting any chest pain.   No ASA with Eliquis Continue Metoprolol Succinate 25mg  Continue Simvastatin 10mg  nightly  Hypothyroidism  TSH noted to be quite elevated this admission, up to 19.276. Patient started on Synthroid. Interestingly, T4 normal. T3 pending.  Very likely a contributing factor to lethargy and edema if truly low thyroid hormone. T3 pending. Management per primary team.   Per primary team: CKD IIIb Dementia Acute  hematuria Elevated D-dimer   Risk Assessment/Risk Scores:        New York Heart Association (NYHA) Functional Class NYHA Class II (difficult to determine given dementia)  CHA2DS2-VASc Score = 5   This indicates a 7.2% annual risk of stroke. The patient's score is based upon: CHF History: 1 HTN History: 1 Diabetes History: 0 Stroke History: 0 Vascular Disease History: 1 Age Score: 2 Gender Score: 0         For questions or updates, please contact [facility] Please consult [email or web address] for contact info under    Signed, Perlie Gold, PA-C  07/17/2023 11:18 AM

## 2023-07-17 NOTE — TOC Initial Note (Signed)
Transition of Care [facility]) - Initial/Assessment Note    Patient Details  Name: [name] MRN: [id number] Date of Birth: 02/10/1925  Transition of Care [facility]) CM/SW Contact:    Larrie Kass, LCSW Phone Number: 07/17/2023, 1:12 PM  Clinical Narrative:                  Pt is from [facility] as a LTC resident , [facility] to follow for d/c needs.     Patient Goals and CMS Choice            Expected Discharge Plan and Services                                              Prior Living Arrangements/Services                       Activities of Daily Living      Permission Sought/Granted                  Emotional Assessment              Admission diagnosis:  Shortness of breath [R06.02] Hypoxia [R09.02] Respiratory failure with hypoxia (HCC) [J96.91] Hypervolemia, unspecified hypervolemia type [E87.70] Fatigue, unspecified type [R53.83] Patient Active Problem List   Diagnosis Date Noted   Acute on chronic respiratory failure with hypoxia (HCC) 07/15/2023   Respiratory failure with hypoxia (HCC) 07/15/2023   Periprosthetic fracture of hip 04/08/2021   Dementia (HCC) 04/08/2021   Coagulopathy (HCC)    Anxiety disorder    Tubular adenoma    Sleep apnea    Left hip pain    Kidney stone    IBS (irritable bowel syndrome)    Hypertension    Hypercholesterolemia    GERD (gastroesophageal reflux disease)    Diverticulosis    Coronary artery disease    Chronic kidney disease, stage 3b (HCC)    BPH (benign prostatic hyperplasia)    Bilateral leg weakness 01/25/2019   Bilateral impacted cerumen 10/27/2018   Presbycusis of both ears 10/27/2018   Paroxysmal atrial fibrillation (HCC) 09/21/2018   Ischemic cardiomyopathy 09/21/2018   Bilateral hearing loss due to cerumen impaction 04/02/2018   Gross hematuria 01/28/2017   Bilateral carpal tunnel syndrome 08/07/2016   Primary osteoarthritis of both first carpometacarpal joints  08/07/2016   Primary osteoarthritis of both hands 08/07/2016   Lipoma of back 08/06/2016   CKD (chronic kidney disease) stage 3, GFR 30-59 ml/min (HCC) 06/02/2016   Anaphylactic reaction 06/02/2016   Chronic systolic CHF (congestive heart failure) (HCC)    Chronic sinus bradycardia    Mechanical instability of hip prosthesis (HCC) 09/06/2015   Sinus bradycardia 11/14/2014   Renal calculus 06/30/2013   Hip dislocation 01/31/2012   Osteoarthritis 08/12/2011   COUGH 11/03/2008   TUBULOVILLOUS ADENOMA, COLON 02/25/2008   HLD (hyperlipidemia) 02/25/2008   Hypertensive heart disease with heart failure (HCC) 02/25/2008   MYOCARDIAL INFARCTION 02/25/2008   Coronary artery disease involving native coronary artery of native heart with angina pectoris (HCC) 02/25/2008   GERD 02/25/2008   DIVERTICULOSIS, COLON 02/25/2008   DEGENERATIVE JOINT DISEASE, CERVICAL SPINE 02/25/2008   TRIGGER FINGER 02/25/2008   HYPERGLYCEMIA 02/25/2008   CARPAL TUNNEL SYNDROME, HX OF 02/25/2008   SVT (supraventricular tachycardia) 02/25/2008   BPH with obstruction/lower urinary tract symptoms 02/25/2008   Unspecified personal history  presenting hazards to health 02/25/2008   APPENDECTOMY, HX OF 02/25/2008   PERCUTANEOUS TRANSLUMINAL CORONARY ANGIOPLASTY, HX OF 02/25/2008   CARPAL TUNNEL RELEASE, RIGHT, HX OF 02/25/2008   NEPHROLITHIASIS, HX OF 02/25/2008   Status post percutaneous transluminal coronary angioplasty 02/25/2008   Myocardial infarction [facility]) 2001   PCP:  [facility]:   [facility] - [place], [place] - [address] [place] [address] Phone: [phone] Fax: [phone]     Social Determinants of Health (SDOH) Social History: SDOH Screenings   Tobacco Use: Low Risk  (06/30/2023)   SDOH Interventions:     Readmission Risk Interventions     No data to display

## 2023-07-17 NOTE — Progress Notes (Signed)
PROGRESS NOTE [name]  WNU:[id number] DOB: July 30, 1925 DOA: 07/15/2023 PCP: [facility]  Brief Narrative/Hospital Course: 89 y.o.m w/ significant for history of CAD- W/ stents, PAF on Eliquis, CKD, pleural effusions, and severe dementia who was sent in from the [facility] home because he has been sleeping a lot more lethargic which is not his baseline.  At baseline he "races" around the nursing facility in his wheelchair and he can stand up and go to the bathroom independently.  In ED: O2 sats were in the mid 80s on room air.  Once he was put on oxygen he was much less lethargic as a matter fact he got pretty agitated in the ED. Labs significant for elevated BNP 1862, troponin  67> 104>117, creatinine at 1.7>2.0, CBC stable without leukocytosis.    Subjective: Patient seen and examined this morning He is alert oriented able to tell me his name and that he is in [place] denies any complaint Past 24 hours afebrile heart rate mostly in 80s to 90s BP soft as low as 89/61 yesterday afternoon,, on [place] down to 3l from 6. Labs shows creatinine 2> 1.96, potassium okay chronic anemia with hemoglobin 10.8 g platelet 139 Tsh was up 19 w/ ft4 normal 0.9 UA unremarkable on admission Foley has hematuria.  Assessment and Plan: Principal Problem:   Acute on chronic respiratory failure with hypoxia (HCC) Active Problems:   Coronary artery disease involving native coronary artery of native heart with angina pectoris (HCC)   BPH with obstruction/lower urinary tract symptoms   Chronic systolic CHF (congestive heart failure) (HCC)   CKD (chronic kidney disease) stage 3, GFR 30-59 ml/min (HCC)   Paroxysmal atrial fibrillation (HCC)   Ischemic cardiomyopathy   Sleep apnea   Dementia (HCC)   Respiratory failure with hypoxia (HCC)   Acute hypoxic respiratory failure  Acute on chronic systolic and diastolic exacerbation Elevated troponin-/demand ischemia from systolic CHF: last  echo was 20-25%, with elevated BNP and troponin in the ED, cxr-compared to 715 There is a pleural effusion and adjacent opacities significant residual enlarged heart with enlarged central pulmonary vessels. Treating for CHF exacerbation with IV Lasix.  Although D-dimer slightly elevated unable to do CTA due to renal dysfunction, Doppler obtained and negative for DVT.  Repeat echo shows EF 20-25%, G3 DD moderately reduced right ventricle systolic function and moderately enlarged right ventricle with severely elevated pulmonary artery systolic pressure right atrial severely dilated left atrium severely dilated mitral valve normal aortic valve calcified>Previously right ventricle cavity normal in size and normal right ventricular function IN 2016.  On Lasix 20 mg twice daily, creatinine elevated but slightly better, no edema improved, cardiology consulted.  Continue Toprol, monitor intake and daily weight. GDMT per cardio. Net IO Since Admission: -2,000 mL [07/17/23 1306]  Filed Weights   07/15/23 1310  Weight: 68 kg    Recent Labs  Lab 07/15/23 1432 07/16/23 0631 07/17/23 0415  BNP 1,862.0*  --   --   BUN 54* 59* 65*  CREATININE 1.73* 2.03* 1.96*  K 4.0 4.5 4.4  MG  --  1.7  --     Abnormal thyroid function test: with elevated TSH however free T4 normal> will need recheck of his TSH before initiating Synthroid on outpatient basis  CAD: Holding aspirin as he is on Eliquis already, continue metoprolol and statin.  Permanent AF: rate controlled on Toprol and anticoagulated on Eliquis-will hold due for hematuria.  Dementia with sundowning and agitation in the ED: the  daughter generally stays with him in the hospital because he does have a lot of trouble with sundowning.  Continue delirium precaution fall precautions.   CKD IIIb: Baseline creatinine from 2022 1.5,?  AKI versus progression of CKD, monitor while on diuresis. Recent Labs    06/30/23 2036 07/15/23 1432 07/16/23 0631  07/17/23 0415  BUN 33* 54* 59* 65*  CREATININE 1.58* 1.73* 2.03* 1.96*  CO2 21* 24 23 21*      Hematuria: appear foley placed in ER- unclear why-having hematuria holding Eliquis for now.  Anemia: appears chronic in nature holding 10 to 11 g range, monitor Recent Labs  Lab 07/15/23 1432 07/16/23 0631 07/17/23 0415  HGB 10.9* 11.2* 10.8*  HCT 34.0* 34.0* 35.4*    DVT prophylaxis: scd Code Status:   Code Status: DNR Family Communication: plan of care discussed with patient at bedside. Patient status is: Inpatient because of acute hypoxia Level of care: Telemetry   Dispo: The patient is from: Nursing facility            Anticipated disposition: Back to nursing facility once better  Objective: Vitals last 24 hrs: Vitals:   07/16/23 1835 07/16/23 2232 07/17/23 0234 07/17/23 0817  BP: 95/66 100/64 108/70 98/67  Pulse: 95 86 93 62  Resp: 16 17 19 16   Temp: 98.2 F (36.8 C) (!) 97.5 F (36.4 C) 97.7 F (36.5 C) 97.6 F (36.4 C)  TempSrc: Oral Oral Oral Oral  SpO2: 96% 98% 100% 100%  Weight:      Height:       Weight change:   Physical Examination: General exam: alert awake, older than stated age HEENT:Oral mucosa moist, Ear/Nose WNL grossly Respiratory system: bilaterally clear BS, no use of accessory muscle Cardiovascular system: S1 & S2 +, No JVD. Gastrointestinal system: Abdomen soft,NT,ND, BS+ Nervous System:Alert, awake, oriented x 1-2, moving extremities. Extremities: LE edema mild wrinkling+,distal peripheral pulses palpable.  Skin: No rashes,no icterus. MSK: Normal muscle bulk,tone, power Foley bag with hematuria  Medications reviewed:  Scheduled Meds:  calcium carbonate  1 tablet Oral Daily   Chlorhexidine Gluconate Cloth  6 each Topical Daily   donepezil  10 mg Oral QHS   escitalopram  5 mg Oral Daily   gabapentin  100 mg Oral Q48H   levothyroxine  25 mcg Oral Q0600   lidocaine  1 patch Transdermal Q24H   metoprolol succinate  25 mg Oral Daily    psyllium  1 packet Oral Daily   senna  1 tablet Oral BID   simvastatin  10 mg Oral QHS   tamsulosin  0.4 mg Oral Daily   Continuous Infusions:    Diet Order             Diet Heart Room service appropriate? Yes; Fluid consistency: Thin  Diet effective now                   Intake/Output Summary (Last 24 hours) at 07/17/2023 1113 Last data filed at 07/17/2023 1100 Gross per 24 hour  Intake 600 ml  Output 2600 ml  Net -2000 ml   Net IO Since Admission: -2,000 mL [07/17/23 1113]  Wt Readings from Last 3 Encounters:  07/15/23 68 kg  06/30/23 68 kg  08/05/22 68 kg     Unresulted Labs (From admission, onward)     Start     Ordered   07/16/23 0500  T3, free  Tomorrow morning,   R        07/15/23 1929  Data Reviewed: I have personally reviewed following labs and imaging studies CBC: Recent Labs  Lab 07/15/23 1432 07/16/23 0631 07/17/23 0415  WBC 7.4 10.0 9.7  NEUTROABS 4.3  --   --   HGB 10.9* 11.2* 10.8*  HCT 34.0* 34.0* 35.4*  MCV 93.2 91.9 97.0  PLT 173 162 139*   Basic Metabolic Panel: Recent Labs  Lab 07/15/23 1432 07/16/23 0631 07/17/23 0415  NA 135 135 135  K 4.0 4.5 4.4  CL 101 100 102  CO2 24 23 21*  GLUCOSE 104* 126* 86  BUN 54* 59* 65*  CREATININE 1.73* 2.03* 1.96*  CALCIUM 9.0 9.4 8.9  MG  --  1.7  --    GFR: Estimated Creatinine Clearance: 19.6 mL/min (A) (by C-G formula based on SCr of 1.96 mg/dL (H)). Liver Function Tests: No results for input(s): "AST", "ALT", "ALKPHOS", "BILITOT", "PROT", "ALBUMIN" in the last 168 hours. No results for input(s): "LIPASE", "AMYLASE" in the last 168 hours. Recent Labs  Lab 07/15/23 1757  AMMONIA 33   Recent Labs    07/15/23 1757 07/16/23 0631  TSH 19.276*  --   FREET4  --  0.90   Sepsis Labs: No results for input(s): "PROCALCITON", "LATICACIDVEN" in the last 168 hours.  Recent Results (from the past 240 hour(s))  Urine Culture     Status: Abnormal   Collection Time: 07/15/23   5:16 PM   Specimen: Urine, Clean Catch  Result Value Ref Range Status   Specimen Description   Final    URINE, CLEAN CATCH Performed at [facility], [address], [place], [place] [address]    Special Requests   Final    NONE Performed at [facility], [address] [place], [place] [address]    Culture MULTIPLE SPECIES PRESENT, SUGGEST RECOLLECTION (A)  Final   Report Status 07/17/2023 FINAL  Final    Antimicrobials: Anti-infectives (From admission, onward)    None      Culture/Microbiology    Component Value Date/Time   SDES  07/15/2023 1716    URINE, CLEAN CATCH Performed at [facility], [address], [place], [place] [address]    SPECREQUEST  07/15/2023 1716    NONE Performed at [facility], [address] [place], [place] [address]    CULT MULTIPLE SPECIES PRESENT, SUGGEST RECOLLECTION (A) 07/15/2023 1716   REPTSTATUS 07/17/2023 FINAL 07/15/2023 1716     Radiology Studies: VAS [place] LOWER EXTREMITY VENOUS (DVT)  Result Date: 07/16/2023  Lower Venous DVT Study Patient Name:  OTT ESSE  Date of Exam:   07/16/2023 Medical Rec #: [id number]         Accession #:    [id number] Date of Birth: 1925-12-07        Patient Gender: M Patient Age:   81 years Exam Location:  [facility] Procedure:      VAS [place] LOWER EXTREMITY VENOUS (DVT) Referring Phys: CLAUDIA CLAIBORNE --------------------------------------------------------------------------------  Indications: Swelling.  Risk Factors: None identified. Limitations: Poor ultrasound/tissue interface and patient positioning. Comparison Study: No prior studies. Performing Technologist: Chanda Busing RVT  Examination Guidelines: A complete evaluation includes B-mode imaging, spectral Doppler, color Doppler, and power Doppler as needed of all accessible portions of each vessel. Bilateral testing is considered an integral part of a complete  examination. Limited examinations for reoccurring indications may be performed as noted. The reflux portion of the exam is performed with the patient in reverse Trendelenburg.  +-----+---------------+---------+-----------+----------+--------------+ RIGHTCompressibilityPhasicitySpontaneityPropertiesThrombus  Aging +-----+---------------+---------+-----------+----------+--------------+ CFV  Full           Yes      Yes                                 +-----+---------------+---------+-----------+----------+--------------+   +---------+---------------+---------+-----------+----------+--------------+ LEFT     CompressibilityPhasicitySpontaneityPropertiesThrombus Aging +---------+---------------+---------+-----------+----------+--------------+ CFV      Full           Yes      Yes                                 +---------+---------------+---------+-----------+----------+--------------+ SFJ      Full                                                        +---------+---------------+---------+-----------+----------+--------------+ FV Prox  Full                                                        +---------+---------------+---------+-----------+----------+--------------+ FV Mid   Full                                                        +---------+---------------+---------+-----------+----------+--------------+ FV DistalFull           Yes      Yes                                 +---------+---------------+---------+-----------+----------+--------------+ PFV      Full                                                        +---------+---------------+---------+-----------+----------+--------------+ POP      Full           Yes      Yes                                 +---------+---------------+---------+-----------+----------+--------------+ PTV      Full                                                         +---------+---------------+---------+-----------+----------+--------------+ PERO     Full                                                        +---------+---------------+---------+-----------+----------+--------------+  Summary: RIGHT: - No evidence of common femoral vein obstruction.  LEFT: - There is no evidence of deep vein thrombosis in the lower extremity. However, portions of this examination were limited- see technologist comments above.  - No cystic structure found in the popliteal fossa.  *See table(s) above for measurements and observations. Electronically signed by Heath Lark on 07/16/2023 at 5:27:54 PM.    Final    ECHOCARDIOGRAM COMPLETE  Result Date: 07/16/2023    ECHOCARDIOGRAM REPORT   Patient Name:   TAMAJ DAMBROSI Date of Exam: 07/16/2023 Medical Rec #:  [id number]        Height:       65.0 in Accession #:    [id number]       Weight:       149.9 lb Date of Birth:  [birth year]       BSA:          1.750 m Patient Age:    89 years         BP:           100/69 mmHg Patient Gender: M                HR:           85 bpm. Exam Location:  Inpatient Procedure: 2D Echo, Cardiac Doppler, Color Doppler and Intracardiac            Opacification Agent REPORT CONTAINS CRITICAL RESULT Indications:    CHF Acute Systolic  History:        Patient has no prior history of Echocardiogram examinations.                 CAD; Arrythmias:Atrial Fibrillation. CKD, severe dementia.  Sonographer:    Milda Smart Referring Phys: [phone] JESSICA U VANN  Sonographer Comments: Image acquisition challenging due to respiratory motion. IMPRESSIONS  1. Left ventricular ejection fraction, by estimation, is 20 to 25%. The left ventricle has severely decreased function. The left ventricle demonstrates global hypokinesis. Left ventricular diastolic parameters are consistent with Grade III diastolic dysfunction (restrictive).  2. Right ventricular systolic function is moderately reduced. The right ventricular size is  moderately enlarged. There is severely elevated pulmonary artery systolic pressure. The estimated right ventricular systolic pressure is 68.0 mmHg.  3. Left atrial size was severely dilated.  4. Right atrial size was severely dilated.  5. The mitral valve is normal in structure. Moderate mitral valve regurgitation. No evidence of mitral stenosis.  6. Tricuspid valve regurgitation is severe.  7. The aortic valve is calcified. Aortic valve regurgitation is mild. No aortic stenosis is present.  8. The inferior vena cava is normal in size with greater than 50% respiratory variability, suggesting right atrial pressure of 3 mmHg. FINDINGS  Left Ventricle: Left ventricular ejection fraction, by estimation, is 20 to 25%. The left ventricle has severely decreased function. The left ventricle demonstrates global hypokinesis. Definity contrast agent was given IV to delineate the left ventricular endocardial borders. The left ventricular internal cavity size was normal in size. There is no left ventricular hypertrophy. Left ventricular diastolic parameters are consistent with Grade III diastolic dysfunction (restrictive).  LV Wall Scoring: The apical septal segment, apical inferior segment, and apex are akinetic. Right Ventricle: The right ventricular size is moderately enlarged. No increase in right ventricular wall thickness. Right ventricular systolic function is moderately reduced. There is severely elevated pulmonary artery systolic pressure. The tricuspid regurgitant velocity is 4.03 m/s, and with an assumed right atrial pressure of  3 mmHg, the estimated right ventricular systolic pressure is 68.0 mmHg. Left Atrium: Left atrial size was severely dilated. Right Atrium: Right atrial size was severely dilated. Pericardium: There is no evidence of pericardial effusion. Mitral Valve: The mitral valve is normal in structure. Moderate mitral valve regurgitation. No evidence of mitral valve stenosis. Tricuspid Valve: The  tricuspid valve is normal in structure. Tricuspid valve regurgitation is severe. No evidence of tricuspid stenosis. Aortic Valve: The aortic valve is calcified. Aortic valve regurgitation is mild. No aortic stenosis is present. Pulmonic Valve: The pulmonic valve was normal in structure. Pulmonic valve regurgitation is mild. No evidence of pulmonic stenosis. Aorta: The aortic root is normal in size and structure. Venous: The inferior vena cava is normal in size with greater than 50% respiratory variability, suggesting right atrial pressure of 3 mmHg. IAS/Shunts: No atrial level shunt detected by color flow Doppler.  LEFT VENTRICLE PLAX 2D LVIDd:         5.30 cm      Diastology LVIDs:         4.50 cm      LV e' medial:    2.31 cm/s LV PW:         1.30 cm      LV E/e' medial:  35.6 LV IVS:        1.10 cm      LV e' lateral:   5.80 cm/s LVOT diam:     2.00 cm      LV E/e' lateral: 14.2 LV SV:         48 LV SV Index:   27 LVOT Area:     3.14 cm  LV Volumes (MOD) LV vol d, MOD A2C: 123.0 ml LV vol d, MOD A4C: 141.0 ml LV vol s, MOD A2C: 80.0 ml LV vol s, MOD A4C: 90.1 ml LV SV MOD A2C:     43.0 ml LV SV MOD A4C:     141.0 ml LV SV MOD BP:      49.3 ml RIGHT VENTRICLE RV Basal diam:  4.40 cm RV Mid diam:    3.10 cm RV S prime:     4.78 cm/s TAPSE (M-mode): 1.0 cm LEFT ATRIUM              Index        RIGHT ATRIUM           Index LA diam:        5.10 cm  2.91 cm/m   RA Area:     32.30 cm LA Vol (A2C):   128.5 ml 73.43 ml/m  RA Volume:   123.00 ml 70.28 ml/m LA Vol (A4C):   105.4 ml 60.23 ml/m LA Biplane Vol: 126.0 ml 72.00 ml/m  AORTIC VALVE LVOT Vmax:   84.10 cm/s LVOT Vmean:  59.100 cm/s LVOT VTI:    0.152 m  AORTA Ao Root diam: 3.30 cm Ao Asc diam:  3.60 cm MITRAL VALVE                  TRICUSPID VALVE MV Area (PHT): 6.96 cm       TR Peak grad:   65.0 mmHg MV Decel Time: 109 msec       TR Mean grad:   38.0 mmHg MR Peak grad:    70.6 mmHg    TR Vmax:        403.00 cm/s MR Mean grad:    51.0 mmHg    TR Vmean:  283.0 cm/s MR Vmax:         420.00 cm/s MR Vmean:        339.0 cm/s   SHUNTS MR PISA:         1.27 cm     Systemic VTI:  0.15 m MR PISA Eff ROA: 12 mm       Systemic Diam: 2.00 cm MR PISA Radius:  0.45 cm MV E velocity: 82.30 cm/s Donato Schultz MD Electronically signed by Donato Schultz MD Signature Date/Time: 07/16/2023/2:48:23 PM    Final    DG Chest 2 View  Result Date: 07/15/2023 CLINICAL DATA:  Shortness of breath EXAM: CHEST - 2 VIEW COMPARISON:  X-ray 06/30/2023 FINDINGS: Enlarged cardiopericardial silhouette. Enlarged pulmonary artery centrally. Small pleural effusions with some adjacent opacities. Decreasing from previous. No pneumothorax calcified aorta. Degenerative changes of the spine on lateral view. IMPRESSION: Decreasing pleural effusions and adjacent opacities. Significant residual. Enlarged heart with enlarged central pulmonary vessels. Electronically Signed   By: Karen Kays M.D.   On: 07/15/2023 14:00     LOS: 2 days   Lanae Boast, MD [facility]  07/17/2023, 11:13 AM

## 2023-07-18 DIAGNOSIS — R0602 Shortness of breath: Secondary | ICD-10-CM

## 2023-07-18 DIAGNOSIS — J9621 Acute and chronic respiratory failure with hypoxia: Secondary | ICD-10-CM | POA: Diagnosis not present

## 2023-07-18 DIAGNOSIS — I5023 Acute on chronic systolic (congestive) heart failure: Secondary | ICD-10-CM | POA: Diagnosis not present

## 2023-07-18 DIAGNOSIS — I4821 Permanent atrial fibrillation: Secondary | ICD-10-CM | POA: Diagnosis not present

## 2023-07-18 DIAGNOSIS — I110 Hypertensive heart disease with heart failure: Secondary | ICD-10-CM | POA: Diagnosis not present

## 2023-07-18 LAB — BASIC METABOLIC PANEL
Anion gap: 10 (ref 5–15)
BUN: 67 mg/dL — ABNORMAL HIGH (ref 8–23)
CO2: 23 mmol/L (ref 22–32)
Calcium: 8.4 mg/dL — ABNORMAL LOW (ref 8.9–10.3)
Chloride: 101 mmol/L (ref 98–111)
Creatinine, Ser: 1.92 mg/dL — ABNORMAL HIGH (ref 0.61–1.24)
GFR, Estimated: 32 mL/min — ABNORMAL LOW (ref >60)
Glucose, Bld: 105 mg/dL — ABNORMAL HIGH (ref 70–99)
Potassium: 4 mmol/L (ref 3.5–5.1)
Sodium: 134 mmol/L — ABNORMAL LOW (ref 135–145)

## 2023-07-18 LAB — COMPREHENSIVE METABOLIC PANEL
ALT: 18 U/L (ref 0–44)
AST: 27 U/L (ref 15–41)
Albumin: 2.4 g/dL — ABNORMAL LOW (ref 3.5–5.0)
Alkaline Phosphatase: 98 U/L (ref 38–126)
Anion gap: 6 (ref 5–15)
BUN: 69 mg/dL — ABNORMAL HIGH (ref 8–23)
CO2: 25 mmol/L (ref 22–32)
Calcium: 8.2 mg/dL — ABNORMAL LOW (ref 8.9–10.3)
Chloride: 99 mmol/L (ref 98–111)
Creatinine, Ser: 1.95 mg/dL — ABNORMAL HIGH (ref 0.61–1.24)
GFR, Estimated: 31 mL/min — ABNORMAL LOW (ref >60)
Glucose, Bld: 115 mg/dL — ABNORMAL HIGH (ref 70–99)
Potassium: 4.5 mmol/L (ref 3.5–5.1)
Sodium: 130 mmol/L — ABNORMAL LOW (ref 135–145)
Total Bilirubin: 0.9 mg/dL (ref 0.3–1.2)
Total Protein: 5 g/dL — ABNORMAL LOW (ref 6.5–8.1)

## 2023-07-18 LAB — CBC
HCT: 26.7 % — ABNORMAL LOW (ref 39.0–52.0)
HCT: 25.2 % — ABNORMAL LOW (ref 39.0–52.0)
Hemoglobin: 8.6 g/dL — ABNORMAL LOW (ref 13.0–17.0)
Hemoglobin: 8.2 g/dL — ABNORMAL LOW (ref 13.0–17.0)
MCH: 30.2 pg (ref 26.0–34.0)
MCH: 30.3 pg (ref 26.0–34.0)
MCHC: 32.2 g/dL (ref 30.0–36.0)
MCHC: 32.5 g/dL (ref 30.0–36.0)
MCV: 93.7 fL (ref 80.0–100.0)
MCV: 93 fL (ref 80.0–100.0)
Platelets: 141 10*3/uL — ABNORMAL LOW (ref 150–400)
Platelets: 132 10*3/uL — ABNORMAL LOW (ref 150–400)
RBC: 2.85 MIL/uL — ABNORMAL LOW (ref 4.22–5.81)
RBC: 2.71 MIL/uL — ABNORMAL LOW (ref 4.22–5.81)
RDW: 16.9 % — ABNORMAL HIGH (ref 11.5–15.5)
RDW: 17.1 % — ABNORMAL HIGH (ref 11.5–15.5)
WBC: 8.7 10*3/uL (ref 4.0–10.5)
WBC: 7.7 10*3/uL (ref 4.0–10.5)
nRBC: 0 % (ref 0.0–0.2)
nRBC: 0 % (ref 0.0–0.2)

## 2023-07-18 MED ORDER — METOPROLOL SUCCINATE ER 25 MG PO TB24
25.0000 mg | ORAL_TABLET | Freq: Once | ORAL | Status: AC
  Administered 2023-07-18: 25 mg via ORAL
  Filled 2023-07-18: qty 1

## 2023-07-18 MED ORDER — METOPROLOL SUCCINATE ER 50 MG PO TB24
50.0000 mg | ORAL_TABLET | Freq: Every day | ORAL | Status: DC
  Filled 2023-07-18: qty 1

## 2023-07-18 MED ORDER — MIDODRINE HCL 5 MG PO TABS
5.0000 mg | ORAL_TABLET | ORAL | Status: AC
  Administered 2023-07-18: 5 mg via ORAL
  Filled 2023-07-18: qty 1

## 2023-07-18 MED ORDER — SODIUM CHLORIDE 0.9 % IV BOLUS
250.0000 mL | Freq: Once | INTRAVENOUS | Status: AC
  Administered 2023-07-18: 250 mL via INTRAVENOUS

## 2023-07-18 MED ORDER — SODIUM CHLORIDE 0.9 % IV BOLUS
500.0000 mL | Freq: Once | INTRAVENOUS | Status: AC
  Administered 2023-07-18: 500 mL via INTRAVENOUS

## 2023-07-18 MED ORDER — MIDODRINE HCL 5 MG PO TABS: 5.0000 mg | ORAL_TABLET | Freq: Three times a day (TID) | ORAL | Status: DC

## 2023-07-18 NOTE — Progress Notes (Signed)
   07/18/23 1701  Assess: MEWS Score  BP (!) 80/48  Assess: MEWS Score  MEWS Temp 0  MEWS Systolic 2  MEWS Pulse 0  MEWS RR 0  MEWS LOC 0  MEWS Score 2  MEWS Score Color Yellow  Assess: if the MEWS score is Yellow or Red  Were vital signs accurate and taken at a resting state? Yes  Does the patient meet 2 or more of the SIRS criteria? No  MEWS guidelines implemented  Yes, yellow  Treat  MEWS Interventions Considered administering scheduled or prn medications/treatments as ordered  Take Vital Signs  Increase Vital Sign Frequency  Yellow: Q2hr x1, continue Q4hrs until patient remains green for 12hrs  Escalate  MEWS: Escalate Yellow: Discuss with charge nurse and consider notifying provider and/or RRT  Notify: Charge Nurse/RN  Name of Charge Nurse/RN Notified Baird Lyons, RN  Provider Notification  Provider Name/Title Dr. Jonathon Bellows  Date Provider Notified 07/18/23  Time Provider Notified 1706  Method of Notification Page  Notification Reason Other (Comment) (BP)  Provider response See new orders  Date of Provider Response 07/18/23  Time of Provider Response 1708   Giving 500cc bolus and given midodrine 5 mg

## 2023-07-18 NOTE — Procedures (Signed)
Mr. [name] is very hard of hearing but I explained the procedure and he consented to exchanging his misplaced Foley catheter for a three-way hematuria catheter.  Following this the patient was prepped and draped in the usual sterile fashion.  10cc of lidocaine lubricant was injected directly into the urethra.  After adequate time for analgesic effect had been provided, a 91f three-way coud hematuria catheter was advanced into the bladder with minimal resistance.  Once position was confirmed, balloon was inflated with 30cc of sterile water.  There was return of scant dark red urine and clot material.  Around 100cc of clot was then hand irrigated from the bladder.  Foley was placed to gravity drainage with no dependent loops.  CBI was initiated and titrated to light pink irrigant.  Concluded the procedure.

## 2023-07-18 NOTE — Progress Notes (Signed)
Patient Name: AGNES PROBERT Date of Encounter: 07/18/2023 [facility] Cardiologist: Norman Herrlich, MD   Interval Summary  .    Feeling well.  Denies CP/SOB or palpitations.  Much more alert today.  Vital Signs .    Vitals:   07/17/23 1430 07/17/23 1451 07/17/23 2057 07/18/23 0507  BP: (!) 84/55 100/73 104/66 129/65  Pulse: 90 89 98 (!) 51  Resp: 18  15 14   Temp: 98 F (36.7 C)  98.1 F (36.7 C) 98.2 F (36.8 C)  TempSrc:   Oral Oral  SpO2: 95%  97% 92%  Weight:      Height:        Intake/Output Summary (Last 24 hours) at 07/18/2023 0949 Last data filed at 07/18/2023 0700 Gross per 24 hour  Intake 360 ml  Output 1950 ml  Net -1590 ml      07/15/2023    1:10 PM 06/30/2023    9:54 PM 08/05/2022    1:37 PM  Last 3 Weights  Weight (lbs) 149 lb 14.6 oz 149 lb 14.6 oz 150 lb  Weight (kg) 68 kg 68 kg 68.04 kg      Telemetry/ECG    Atrial fibrillation.  Rate 90s-110s.  - Personally Reviewed  Physical Exam .    VS:  BP 129/65 (BP Location: Left Arm)   Pulse (!) 51   Temp 98.2 F (36.8 C) (Oral)   Resp 14   Ht 5\' 5"  (1.651 m)   Wt 68 kg   SpO2 92%   BMI 24.95 kg/m  , BMI Body mass index is 24.95 kg/m. GENERAL:  Well appearing.  Frail.  Very hard of hearing.  Answers questions appropriately HEENT: Pupils equal round and reactive, fundi not visualized, oral mucosa unremarkable NECK:  No jugular venous distention, waveform within normal limits, carotid upstroke brisk and symmetric, no bruits, no thyromegaly LUNGS:  Clear to auscultation bilaterally HEART:  Irregularly irregular.  Tachycardic.  PMI not displaced or sustained,S1 and S2 within normal limits, no S3, no S4, no clicks, no rubs, no murmurs ABD:  Flat, positive bowel sounds normal in frequency in pitch, no bruits, no rebound, no guarding, no midline pulsatile mass, no hepatomegaly, no splenomegaly EXT:  2 plus pulses throughout, no edema, no cyanosis no clubbing SKIN: Multiple ecchymoses and skin  tears NEURO:  Cranial nerves II through XII grossly intact, motor grossly intact throughout PSYCH:  Cognitively intact, oriented to person and city   Assessment & Plan .     Mr. Raffo is a 29M with CAD sp PCI, hypertension, HFrEF, permanent atrial fibrillation, CKD 3B, and advanced dementia admitted with acute on chronic systolic diastolic heart failure and elevated troponin.  Patient is unable to provide any history.  Admitted for weight gain and change in mental status.   # Acute on chronic systolic diastolic heart failure: # RV Failure: # Pulmonary HTN:  # Hypertension: LVEF 20-25%.  No significant changes in LVEF.  It was 30% in 2016.  RV dysfunction is new.  However, RVSP was in 2016, so his elevated pulmonary pressures are old and likely the cause of his RV failure.  Given chronic Eliquis, low likelihood that this is due to acute PE.  No CT-A given his renal dysfunction.  Given his age, no plan for PAH work up. He looks great today and euvolemic on exam.  Continue to hold furosemide.  Renal function is improving.  His home Sherryll Burger continues to be held.  Blood pressures  are quite stable.  May need to add it back at a lower dose.  Resume home torsemide once renal function stabilizes.  Continue metoprolol.  Heart rates are uncontrolled.  Will increase to 50 mg daily.  # AoCKD: Creatinine improving after holding lasix and Entresto.  Would continue to hold today.   # Permanent atrial fibrillation: Chronic.  Rates are high today.  Increasing metoprolol as above.  Holding Eliquis due to active hematuria from pulling his Foley catheter.     # CAD: Holding aspirin.  He does not need aspirin and Eliquis.  Continue metoprolol and statin.   # Hypothyroidism: Levothyroxine adjusted per primary team.   For questions or updates, please contact [facility] Please consult [email or web address] for contact info under        Signed, Chilton Si, MD

## 2023-07-18 NOTE — Progress Notes (Signed)
PROGRESS NOTE [name]  VHQ:[id number] DOB: [birth year] DOA: 07/15/2023 PCP: [facility]  Brief Narrative/Hospital Course: 89 y.o.m w/ significant for history of CAD- W/ stents, PAF on Eliquis, CKD, pleural effusions, and severe dementia who was sent in [facility] the [facility] because he has been sleeping a lot more lethargic which is not his baseline.  At baseline he "races" around the nursing facility in his wheelchair and he can stand up and go to the bathroom independently.  In ED: O2 sats were in the mid 80s on room air.  Once he was put on oxygen he was much less lethargic as a matter fact he got pretty agitated in the ED. Labs significant for elevated BNP 1862, troponin  67> 104>117, creatinine at 1.7>2.0, CBC stable without leukocytosis.    Subjective: Patient seen and examined this morning He is alert awake hard of hearing able to tell me his name and that he is doing fine He is much more alert today Overnight patient has been afebrile BP stable Labs pending this morning--subsequently resulted creatinine at 1.9 stable hemoglobin further dropping 8.6 g He is much more alert awake today Assessment and Plan: Principal Problem:   Acute on chronic respiratory failure with hypoxia (HCC) Active Problems:   Coronary artery disease involving native coronary artery of native heart with angina pectoris (HCC)   BPH with obstruction/lower urinary tract symptoms   Chronic systolic CHF (congestive heart failure) (HCC)   CKD (chronic kidney disease) stage 3, GFR 30-59 ml/min (HCC)   Paroxysmal atrial fibrillation (HCC)   Ischemic cardiomyopathy   Sleep apnea   Dementia (HCC)   Respiratory failure with hypoxia (HCC)   Shortness of breath   Acute hypoxic respiratory failure  Acute on chronic systolic and diastolic exacerbation Elevated troponin-/demand ischemia from systolic CHF: last echo was 20-25%, with elevated BNP and troponin in the ED, cxr-compared to  7/15-There is a pleural effusion and adjacent opacities significant residual enlarged heart with enlarged central pulmonary vessels"Although D-dimer slightly elevated unable to do CTA due to renal dysfunction, Doppler obtained and negative for DVT.  Appreciate cardiology input on board> echo with EF 20-20% no significant changes in LVEF is elevated pulmonary pressures are overall unlikely the cause of his RV failure, given chronic Eliquis low likelihood that this is due to acute PE discussed with cardiology. Given this is no plan for PAH workup overall euvolemic now holding furosemide, renal function slowly improving, Entresto on hold-once renal stable will resume home torsemide.  Continue metoprolol-dose increased to 50 mg daily as heart rate is uncontrolled. Cont to monitor intake and daily weight. GDMT per cardio. Net IO Since Admission: -2,840 mL [07/18/23 1150]  Filed Weights   07/15/23 1310  Weight: 68 kg    Recent Labs  Lab 07/15/23 1432 07/16/23 0631 07/17/23 0415 07/18/23 0855  BNP 1,862.0*  --   --   --   BUN 54* 59* 65* 67*  CREATININE 1.73* 2.03* 1.96* 1.92*  K 4.0 4.5 4.4 4.0  MG  --  1.7  --   --     Abnormal thyroid function test: with elevated TSH however free T4 normal> will need recheck of his TSH before initiating Synthroid on outpatient basis  CAD: Holding aspirin as he is on Eliquis already, continue metoprolol and statin.  Permanent AF: rate controlled on Toprol and anticoagulated on Eliquis-will hold due for hematuria.  Dementia with sundowning and agitation in the ED: the daughter generally stays with him in  the hospital because he does have a lot of trouble with sundowning.  Appears much more alert awake today, continue delirium precaution fall precautions.   CKD IIIb: Baseline creatinine from 2022 1.5,?  AKI versus progression of CKD, creatinine slowly better diuretics on hold Recent Labs    06/30/23 2036 07/15/23 1432 07/16/23 0631 07/17/23 0415  07/18/23 0855  BUN 33* 54* 59* 65* 67*  CREATININE 1.58* 1.73* 2.03* 1.96* 1.92*  CO2 21* 24 23 21* 23    Hematuria: appear foley placed in ER-unclear why-having hematuria holding Eliquis for now. Per daughter he has had hematuria before form kidney stone and BPH and had TURP. Started having hematuria after foley insertion in ED.per daughter He has chronic urgency he was having to void every 10-20 minutes and as yelling out in waiting room and ED inserted foley. She thinks foley will help him. She says "It's gonna be a problem if he does not have a catheter, he will ask to go to toilet all the time and does not like bed railing and will be agitated" I will get Urology to see him. He is f/b [facility]. Messaged Cam.  Anemia: Acute blood loss in the setting of hematuria and Eliquis use blood workup showed hemoglobin dropped to 8.6 g, continue to monitor  keep folye for now until hematuria better before considering voiding trial  Recent Labs  Lab 07/15/23 1432 07/16/23 0631 07/17/23 0415 07/18/23 0855  HGB 10.9* 11.2* 10.8* 8.6*  HCT 34.0* 34.0* 35.4* 26.7*   DVT prophylaxis: scd Code Status:   Code Status: DNR Family Communication: plan of care discussed with patient at bedside. I called and updated his daughter. Patient status is: Inpatient because of acute hypoxia. Level of care: Telemetry   Dispo: The patient is from: Nursing facility            Anticipated disposition: Back to nursing facility once better  Objective: Vitals last 24 hrs: Vitals:   07/17/23 1430 07/17/23 1451 07/17/23 2057 07/18/23 0507  BP: (!) 84/55 100/73 104/66 129/65  Pulse: 90 89 98 (!) 51  Resp: 18  15 14   Temp: 98 F (36.7 C)  98.1 F (36.7 C) 98.2 F (36.8 C)  TempSrc:   Oral Oral  SpO2: 95%  97% 92%  Weight:      Height:       Weight change:   Physical Examination: General exam: alert awake, oriented x1, elderly HEENT:Oral mucosa moist, Ear/Nose WNL grossly Respiratory system:  Bilaterally clear BS,no use of accessory muscle Cardiovascular system: S1 & S2 +, No JVD. Gastrointestinal system: Abdomen soft,NT,ND, BS+ Nervous System: Alert, awake, moving all extremities,and following commands. Extremities: LE edema neg, skin wrinkling+distal peripheral pulses palpable and warm.  Skin: No rashes,no icterus. MSK: Normal muscle bulk,tone, power   Medications reviewed:  Scheduled Meds:  calcium carbonate  1 tablet Oral Daily   Chlorhexidine Gluconate Cloth  6 each Topical Daily   donepezil  10 mg Oral QHS   escitalopram  5 mg Oral Daily   gabapentin  100 mg Oral Q48H   levothyroxine  25 mcg Oral Q0600   lidocaine  1 patch Transdermal Q24H   metoprolol succinate  25 mg Oral Once   [START ON 07/19/2023] metoprolol succinate  50 mg Oral Daily   psyllium  1 packet Oral Daily   senna  1 tablet Oral BID   simvastatin  10 mg Oral QHS   tamsulosin  0.4 mg Oral Daily  Continuous Infusions:  Diet  Order             Diet Heart Room service appropriate? Yes; Fluid consistency: Thin  Diet effective now                  Intake/Output Summary (Last 24 hours) at 07/18/2023 1150 Last data filed at 07/18/2023 0700 Gross per 24 hour  Intake --  Output 1200 ml  Net -1200 ml   Net IO Since Admission: -2,840 mL [07/18/23 1150]  Wt Readings from Last 3 Encounters:  07/15/23 68 kg  06/30/23 68 kg  08/05/22 68 kg     Unresulted Labs (From admission, onward)     Start     Ordered   07/19/23 0500  Basic metabolic panel  Daily,   R     Question:  Specimen collection method  Answer:  Lab=Lab collect   07/18/23 0838   07/19/23 0500  CBC  Daily,   R     Question:  Specimen collection method  Answer:  Lab=Lab collect   07/18/23 0838          Data Reviewed: I have personally reviewed following labs and imaging studies CBC: Recent Labs  Lab 07/15/23 1432 07/16/23 0631 07/17/23 0415 07/18/23 0855  WBC 7.4 10.0 9.7 8.7  NEUTROABS 4.3  --   --   --   HGB 10.9* 11.2*  10.8* 8.6*  HCT 34.0* 34.0* 35.4* 26.7*  MCV 93.2 91.9 97.0 93.7  PLT 173 162 139* 141*   Basic Metabolic Panel: Recent Labs  Lab 07/15/23 1432 07/16/23 0631 07/17/23 0415 07/18/23 0855  NA 135 135 135 134*  K 4.0 4.5 4.4 4.0  CL 101 100 102 101  CO2 24 23 21* 23  GLUCOSE 104* 126* 86 105*  BUN 54* 59* 65* 67*  CREATININE 1.73* 2.03* 1.96* 1.92*  CALCIUM 9.0 9.4 8.9 8.4*  MG  --  1.7  --   --    Recent Labs  Lab 07/15/23 1757  AMMONIA 33   Recent Labs    07/15/23 1757 07/16/23 0631  TSH 19.276*  --   FREET4  --  0.90  T3FREE  --  2.6   Sepsis Labs: No results for input(s): "PROCALCITON", "LATICACIDVEN" in the last 168 hours.  Recent Results (from the past 240 hour(s))  Urine Culture     Status: Abnormal   Collection Time: 07/15/23  5:16 PM   Specimen: Urine, Clean Catch  Result Value Ref Range Status   Specimen Description   Final    URINE, CLEAN CATCH Performed at [facility], [address] [place], [place] [address]    Special Requests   Final    NONE Performed at [facility], [address], [place], [place] [address]    Culture MULTIPLE SPECIES PRESENT, SUGGEST RECOLLECTION (A)  Final   Report Status 07/17/2023 FINAL  Final    Antimicrobials: Anti-infectives (From admission, onward)    None      Culture/Microbiology    Component Value Date/Time   SDES  07/15/2023 1716    URINE, CLEAN CATCH Performed at [facility], [address] [place], [place] [address]    SPECREQUEST  07/15/2023 1716    NONE Performed at [facility], [address], [place], [place] [address]    CULT MULTIPLE SPECIES PRESENT, SUGGEST RECOLLECTION (A) 07/15/2023 1716   REPTSTATUS 07/17/2023 FINAL 07/15/2023 1716    Radiology Studies: ECHOCARDIOGRAM COMPLETE  Result Date: 07/16/2023  ECHOCARDIOGRAM REPORT   Patient Name:   [name] Date of Exam: 07/16/2023 Medical Rec #:   [id number]        Height:       65.0 in Accession #:    [id number]       Weight:       149.9 lb Date of Birth:  16-Oct-1925       BSA:          1.750 m Patient Age:    89 years         BP:           100/69 mmHg Patient Gender: M                HR:           85 bpm. Exam Location:  Inpatient Procedure: 2D Echo, Cardiac Doppler, Color Doppler and Intracardiac            Opacification Agent REPORT CONTAINS CRITICAL RESULT Indications:    CHF Acute Systolic  History:        Patient has no prior history of Echocardiogram examinations.                 CAD; Arrythmias:Atrial Fibrillation. CKD, severe dementia.  Sonographer:    Milda Smart Referring Phys: [phone] JESSICA U VANN  Sonographer Comments: Image acquisition challenging due to respiratory motion. IMPRESSIONS  1. Left ventricular ejection fraction, by estimation, is 20 to 25%. The left ventricle has severely decreased function. The left ventricle demonstrates global hypokinesis. Left ventricular diastolic parameters are consistent with Grade III diastolic dysfunction (restrictive).  2. Right ventricular systolic function is moderately reduced. The right ventricular size is moderately enlarged. There is severely elevated pulmonary artery systolic pressure. The estimated right ventricular systolic pressure is 68.0 mmHg.  3. Left atrial size was severely dilated.  4. Right atrial size was severely dilated.  5. The mitral valve is normal in structure. Moderate mitral valve regurgitation. No evidence of mitral stenosis.  6. Tricuspid valve regurgitation is severe.  7. The aortic valve is calcified. Aortic valve regurgitation is mild. No aortic stenosis is present.  8. The inferior vena cava is normal in size with greater than 50% respiratory variability, suggesting right atrial pressure of 3 mmHg. FINDINGS  Left Ventricle: Left ventricular ejection fraction, by estimation, is 20 to 25%. The left ventricle has severely decreased function. The left ventricle demonstrates  global hypokinesis. Definity contrast agent was given IV to delineate the left ventricular endocardial borders. The left ventricular internal cavity size was normal in size. There is no left ventricular hypertrophy. Left ventricular diastolic parameters are consistent with Grade III diastolic dysfunction (restrictive).  LV Wall Scoring: The apical septal segment, apical inferior segment, and apex are akinetic. Right Ventricle: The right ventricular size is moderately enlarged. No increase in right ventricular wall thickness. Right ventricular systolic function is moderately reduced. There is severely elevated pulmonary artery systolic pressure. The tricuspid regurgitant velocity is 4.03 m/s, and with an assumed right atrial pressure of 3 mmHg, the estimated right ventricular systolic pressure is 68.0 mmHg. Left Atrium: Left atrial size was severely dilated. Right Atrium: Right atrial size was severely dilated. Pericardium: There is no evidence of pericardial effusion. Mitral Valve: The mitral valve is normal in structure. Moderate mitral valve regurgitation. No evidence of mitral valve stenosis. Tricuspid Valve: The tricuspid valve is normal in structure. Tricuspid valve regurgitation is severe. No evidence of tricuspid stenosis. Aortic Valve: The aortic valve is  calcified. Aortic valve regurgitation is mild. No aortic stenosis is present. Pulmonic Valve: The pulmonic valve was normal in structure. Pulmonic valve regurgitation is mild. No evidence of pulmonic stenosis. Aorta: The aortic root is normal in size and structure. Venous: The inferior vena cava is normal in size with greater than 50% respiratory variability, suggesting right atrial pressure of 3 mmHg. IAS/Shunts: No atrial level shunt detected by color flow Doppler.  LEFT VENTRICLE PLAX 2D LVIDd:         5.30 cm      Diastology LVIDs:         4.50 cm      LV e' medial:    2.31 cm/s LV PW:         1.30 cm      LV E/e' medial:  35.6 LV IVS:        1.10 cm       LV e' lateral:   5.80 cm/s LVOT diam:     2.00 cm      LV E/e' lateral: 14.2 LV SV:         48 LV SV Index:   27 LVOT Area:     3.14 cm  LV Volumes (MOD) LV vol d, MOD A2C: 123.0 ml LV vol d, MOD A4C: 141.0 ml LV vol s, MOD A2C: 80.0 ml LV vol s, MOD A4C: 90.1 ml LV SV MOD A2C:     43.0 ml LV SV MOD A4C:     141.0 ml LV SV MOD BP:      49.3 ml RIGHT VENTRICLE RV Basal diam:  4.40 cm RV Mid diam:    3.10 cm RV S prime:     4.78 cm/s TAPSE (M-mode): 1.0 cm LEFT ATRIUM              Index        RIGHT ATRIUM           Index LA diam:        5.10 cm  2.91 cm/m   RA Area:     32.30 cm LA Vol (A2C):   128.5 ml 73.43 ml/m  RA Volume:   123.00 ml 70.28 ml/m LA Vol (A4C):   105.4 ml 60.23 ml/m LA Biplane Vol: 126.0 ml 72.00 ml/m  AORTIC VALVE LVOT Vmax:   84.10 cm/s LVOT Vmean:  59.100 cm/s LVOT VTI:    0.152 m  AORTA Ao Root diam: 3.30 cm Ao Asc diam:  3.60 cm MITRAL VALVE                  TRICUSPID VALVE MV Area (PHT): 6.96 cm       TR Peak grad:   65.0 mmHg MV Decel Time: 109 msec       TR Mean grad:   38.0 mmHg MR Peak grad:    70.6 mmHg    TR Vmax:        403.00 cm/s MR Mean grad:    51.0 mmHg    TR Vmean:       283.0 cm/s MR Vmax:         420.00 cm/s MR Vmean:        339.0 cm/s   SHUNTS MR PISA:         1.27 cm     Systemic VTI:  0.15 m MR PISA Eff ROA: 12 mm       Systemic Diam: 2.00 cm MR PISA Radius:  0.45 cm MV E velocity: 82.30 cm/s  Donato Schultz MD Electronically signed by Donato Schultz MD Signature Date/Time: 07/16/2023/2:48:23 PM    Final      LOS: 3 days   Lanae Boast, MD [facility]  07/18/2023, 11:50 AM

## 2023-07-18 NOTE — Consult Note (Addendum)
Urology Consult Note   Requesting Attending Physician:  Lanae Boast, MD Service Providing Consult: Urology  Consulting Attending: Dr. Margo Aye   Reason for Consult: Hematuria  HPI: [name] Card is seen in consultation for reasons noted above at the request of Lanae Boast, MD  This is a 89 y.o. male directed to [facility] emergency department from [facility], his nursing home, because he had been sleeping and more lethargic compared to his baseline.  He was ultimately admitted for acute on chronic systolic and diastolic exacerbation of heart failure-EF 20-25%.  He is being diuresed.  Patient suffers from dementia and is very hard of hearing.  He was getting up constantly to urinate and the decision was made to place a Foley catheter.  The catheter was traumatically placed with balloon inflated in the prostate.  Patient is on blood thinners and bled significantly, resulting in clot obstruction of urine.  He has had a roughly 2g drop in hemoglobin since yesterday.  It appears that he was followed by Dr. Logan Bores of [facility] for kidney stones, s/p TURP, and gross hematuria. Last office visit in 2020.    Past Medical History: Past Medical History:  Diagnosis Date   Anaphylactic reaction 06/02/2016   APPENDECTOMY, HX OF 02/25/2008   Qualifier: Diagnosis of  By: Genelle Gather CMA, [place]     Bilateral carpal tunnel syndrome 08/07/2016   Bilateral hearing loss due to cerumen impaction 04/02/2018   Bilateral impacted cerumen 10/27/2018   Bilateral leg weakness 01/25/2019   BPH (benign prostatic hyperplasia)    BPH with obstruction/lower urinary tract symptoms 02/25/2008   Qualifier: Diagnosis of  By: Genelle Gather CMA, [place]     CARPAL TUNNEL RELEASE, RIGHT, HX OF 02/25/2008   Qualifier: Diagnosis of  By: Genelle Gather CMA, [place]     Chronic sinus bradycardia    Chronic systolic (congestive) heart failure (HCC)    CKD (chronic kidney disease) stage 3, GFR 30-59 ml/min (HCC) 06/02/2016   CKD (chronic kidney disease),  stage III (HCC)    Coronary artery disease    Coronary artery disease involving native coronary artery of native heart with angina pectoris (HCC) 02/25/2008   Qualifier: Diagnosis of  By: Genelle Gather CMA, [place]     Cough 11/03/2008   Qualifier: Diagnosis of  By: Debby Bud MD, Rosalyn Gess    DEGENERATIVE JOINT DISEASE, CERVICAL SPINE 02/25/2008   Qualifier: Diagnosis of  By: Genelle Gather CMA, [place]     Diverticulosis    DIVERTICULOSIS, COLON 02/25/2008   Qualifier: Diagnosis of  By: Genelle Gather CMA, [place]     GERD 02/25/2008   Qualifier: Diagnosis of  By: Genelle Gather CMA, [place]     GERD (gastroesophageal reflux disease)    Gross hematuria 01/28/2017   Hip dislocation 01/31/2012   HLD (hyperlipidemia) 02/25/2008   Qualifier: Diagnosis of  By: Genelle Gather CMA, [place]     Hypercholesterolemia    HYPERGLYCEMIA 02/25/2008   Qualifier: History of  By: Genelle Gather CMA, [place]     Hypertension    Hypertensive heart disease with heart failure (HCC) 02/25/2008   Qualifier: Diagnosis of  By: Genelle Gather CMA, [place]     IBS (irritable bowel syndrome)    Ischemic cardiomyopathy 09/21/2018   Kidney stone    Left hip pain    Chronic left hip discomfort   Lipoma of back 08/06/2016   Mechanical instability of hip prosthesis (HCC) 09/06/2015   MYOCARDIAL INFARCTION 02/25/2008   Qualifier: History of  By: Genelle Gather CMA, [place]     Myocardial infarction (HCC)  2001   Previous anterolateral myocardial infarction with presistent ST-T wave changes   NEPHROLITHIASIS, HX OF 02/25/2008   Qualifier: Diagnosis of  By: Genelle Gather CMA, [place]     Osteoarthritis    Paroxysmal atrial fibrillation (HCC) 09/21/2018   PERCUTANEOUS TRANSLUMINAL CORONARY ANGIOPLASTY, HX OF 02/25/2008   Qualifier: Diagnosis of  By: Genelle Gather CMA, [place]     Primary osteoarthritis of both first carpometacarpal joints 08/07/2016   Primary osteoarthritis of both hands 08/07/2016   Renal calculus 06/30/2013   Sinus bradycardia 11/14/2014   Sleep apnea    uses cpap   SVT  (supraventricular tachycardia)    TRIGGER FINGER 02/25/2008   Annotation: right index finger Qualifier: History of  By: Genelle Gather CMA, [place]     Tubular adenoma    TUBULOVILLOUS ADENOMA, COLON 02/25/2008   Qualifier: History of  By: Genelle Gather CMA, [place]     Unspecified personal history presenting hazards to health 02/25/2008   Centricity Description: POLYPECTOMY, HX OF Qualifier: Diagnosis of  By: Genelle Gather CMA, [place]   Centricity Description: PROSTATECTOMY, TRANSURETHRAL, HX OF Qualifier: Diagnosis of  By: Genelle Gather CMA, [place]      Past Surgical History:  Past Surgical History:  Procedure Laterality Date   ANGIOPLASTY  2002   with stent and angioplasty in 2003   APPENDECTOMY     BACK SURGERY     CARDIAC CATHETERIZATION  05/07/2002   Ejection fraction 20%   CARPAL TUNNEL RELEASE Left 08/29/2016   Procedure: LEFT CARPAL TUNNEL RELEASE;  Surgeon: Cindee Salt, MD;  Location: [facility];  Service: Orthopedics;  Laterality: Left;  REG/FAB   FINGER SURGERY     HIP CLOSED REDUCTION  01/31/2012   Procedure: CLOSED MANIPULATION HIP;  Surgeon: Loanne Drilling, MD;  Location: [facility];  Service: Orthopedics;  Laterality: Right;  closed reduction right dislocated hip   HIP CLOSED REDUCTION  10/10/2012   Procedure: CLOSED MANIPULATION HIP;  Surgeon: Eugenia Mcalpine, MD;  Location: [facility];  Service: Orthopedics;  Laterality: Right;   HIP CLOSED REDUCTION Right 03/17/2015   Procedure: CLOSED REDUCTION HIP;  Surgeon: Samson Frederic, MD;  Location: [facility];  Service: Orthopedics;  Laterality: Right;   JOINT REPLACEMENT     BILATERAL HIP REPLACEMENTS   TONSILLECTOMY     TOTAL HIP REVISION Right 09/06/2015   Procedure: RIGHT TOTAL HIP ACETABULAR LINER REVISION (CONSTRAINED LINER);  Surgeon: Ollen Gross, MD;  Location: [facility];  Service: Orthopedics;  Laterality: Right;   TRANSURETHRAL RESECTION OF PROSTATE      Medication: Current Facility-Administered Medications  Medication Dose Route Frequency  Provider Last Rate Last Admin   calcium carbonate (TUMS - dosed in mg elemental calcium) chewable tablet 200 mg of elemental calcium  1 tablet Oral Daily Buena Irish, MD   200 mg of elemental calcium at 07/18/23 0904   Chlorhexidine Gluconate Cloth 2 % PADS 6 each  6 each Topical Daily Marlin Canary U, DO   6 each at 07/18/23 0905   donepezil (ARICEPT) tablet 10 mg  10 mg Oral QHS Buena Irish, MD   10 mg at 07/17/23 2234   escitalopram (LEXAPRO) tablet 5 mg  5 mg Oral Daily Buena Irish, MD   5 mg at 07/18/23 0905   gabapentin (NEURONTIN) capsule 100 mg  100 mg Oral Q48H Buena Irish, MD   100 mg at 07/17/23 2234   levothyroxine (SYNTHROID) tablet 25 mcg  25 mcg Oral Q0600 Buena Irish, MD   25 mcg at 07/18/23 0653   lidocaine (  LIDODERM) 5 % 1 patch  1 patch Transdermal Q24H Buena Irish, MD       melatonin tablet 3 mg  3 mg Oral QHS PRN Buena Irish, MD   3 mg at 07/16/23 0035   [START ON 07/19/2023] metoprolol succinate (TOPROL-XL) 24 hr tablet 50 mg  50 mg Oral Daily Chilton Si, MD       ondansetron [facility]) tablet 4 mg  4 mg Oral Q6H PRN Buena Irish, MD       Or   ondansetron [facility]) injection 4 mg  4 mg Intravenous Q6H PRN Buena Irish, MD       oxyCODONE (Oxy IR/ROXICODONE) immediate release tablet 2.5 mg  2.5 mg Oral Q8H PRN Buena Irish, MD       psyllium (HYDROCIL/METAMUCIL) 1 packet  1 packet Oral Daily Buena Irish, MD   1 packet at 07/18/23 2956   senna (SENOKOT) tablet 8.6 mg  1 tablet Oral BID Buena Irish, MD   8.6 mg at 07/18/23 0904   simvastatin (ZOCOR) tablet 10 mg  10 mg Oral QHS Buena Irish, MD   10 mg at 07/17/23 2234   sorbitol 70 % solution 30 mL  30 mL Oral Daily PRN Buena Irish, MD       tamsulosin [facility]) capsule 0.4 mg  0.4 mg Oral Daily Buena Irish, MD   0.4 mg at 07/18/23 2130    Allergies: Allergies  Allergen Reactions   Beef-Derived Products Hives    Severe  hives, nausea, throat swelling   Acetaminophen Other (See Comments)    urticaria urticaria   Alpha-Gal     Per mar   Amiodarone Other (See Comments)    Unknown  Affected my breathing   Prilosec [Omeprazole]     Diarrhea, stomach cramps   Prednisone Palpitations    REACTION: causes tachycardia, increase BP    Social History: Social History   Tobacco Use   Smoking status: Never   Smokeless tobacco: Never  Vaping Use   Vaping status: Never Used  Substance Use Topics   Alcohol use: No   Drug use: No    Family History Family History  Problem Relation Age of Onset   Heart attack Father    Stroke Father    Stroke Mother    Alzheimer's disease Mother    Cancer Sister     Review of Systems  Genitourinary:  Positive for hematuria.     Objective   Vital signs in last 24 hours: BP (!) 85/55   Pulse 82   Temp 98 F (36.7 C)   Resp 20   Ht 5\' 5"  (1.651 m)   Wt 68 kg   SpO2 100%   BMI 24.95 kg/m   Physical Exam General: advanced age HEENT: [place]/AT Pulmonary: Normal work of breathing Cardiovascular: RRR, no cyanosis Abdomen: Soft, NTTP, nondistended GU: foley in place obstructed by clot material Neuro: hard of hearing, dementia  Most Recent Labs: Lab Results  Component Value Date   WBC 8.7 07/18/2023   HGB 8.6 (L) 07/18/2023   HCT 26.7 (L) 07/18/2023   PLT 141 (L) 07/18/2023    Lab Results  Component Value Date   NA 134 (L) 07/18/2023   K 4.0 07/18/2023   CL 101 07/18/2023   CO2 23 07/18/2023   BUN 67 (H) 07/18/2023   CREATININE 1.92 (H) 07/18/2023   CALCIUM 8.4 (L) 07/18/2023   MG 1.7 07/16/2023    Lab Results  Component Value Date   INR 1.4 (H)  06/30/2023   APTT 27 08/31/2015     Urine Culture: @[id number] (laburin,org,[id number],[id number])@   IMAGING: ECHOCARDIOGRAM COMPLETE  Result Date: 07/16/2023    ECHOCARDIOGRAM REPORT   Patient Name:   ARTIS BEGGS Date of Exam: 07/16/2023 Medical Rec #:  [id number]        Height:       65.0 in  Accession #:    [id number]       Weight:       149.9 lb Date of Birth:  1925/07/19       BSA:          1.750 m Patient Age:    89 years         BP:           100/69 mmHg Patient Gender: M                HR:           85 bpm. Exam Location:  Inpatient Procedure: 2D Echo, Cardiac Doppler, Color Doppler and Intracardiac            Opacification Agent REPORT CONTAINS CRITICAL RESULT Indications:    CHF Acute Systolic  History:        Patient has no prior history of Echocardiogram examinations.                 CAD; Arrythmias:Atrial Fibrillation. CKD, severe dementia.  Sonographer:    Milda Smart Referring Phys: [phone] JESSICA U VANN  Sonographer Comments: Image acquisition challenging due to respiratory motion. IMPRESSIONS  1. Left ventricular ejection fraction, by estimation, is 20 to 25%. The left ventricle has severely decreased function. The left ventricle demonstrates global hypokinesis. Left ventricular diastolic parameters are consistent with Grade III diastolic dysfunction (restrictive).  2. Right ventricular systolic function is moderately reduced. The right ventricular size is moderately enlarged. There is severely elevated pulmonary artery systolic pressure. The estimated right ventricular systolic pressure is 68.0 mmHg.  3. Left atrial size was severely dilated.  4. Right atrial size was severely dilated.  5. The mitral valve is normal in structure. Moderate mitral valve regurgitation. No evidence of mitral stenosis.  6. Tricuspid valve regurgitation is severe.  7. The aortic valve is calcified. Aortic valve regurgitation is mild. No aortic stenosis is present.  8. The inferior vena cava is normal in size with greater than 50% respiratory variability, suggesting right atrial pressure of 3 mmHg. FINDINGS  Left Ventricle: Left ventricular ejection fraction, by estimation, is 20 to 25%. The left ventricle has severely decreased function. The left ventricle demonstrates global hypokinesis. Definity contrast  agent was given IV to delineate the left ventricular endocardial borders. The left ventricular internal cavity size was normal in size. There is no left ventricular hypertrophy. Left ventricular diastolic parameters are consistent with Grade III diastolic dysfunction (restrictive).  LV Wall Scoring: The apical septal segment, apical inferior segment, and apex are akinetic. Right Ventricle: The right ventricular size is moderately enlarged. No increase in right ventricular wall thickness. Right ventricular systolic function is moderately reduced. There is severely elevated pulmonary artery systolic pressure. The tricuspid regurgitant velocity is 4.03 m/s, and with an assumed right atrial pressure of 3 mmHg, the estimated right ventricular systolic pressure is 68.0 mmHg. Left Atrium: Left atrial size was severely dilated. Right Atrium: Right atrial size was severely dilated. Pericardium: There is no evidence of pericardial effusion. Mitral Valve: The mitral valve is normal in structure. Moderate mitral valve regurgitation. No evidence of mitral valve stenosis.  Tricuspid Valve: The tricuspid valve is normal in structure. Tricuspid valve regurgitation is severe. No evidence of tricuspid stenosis. Aortic Valve: The aortic valve is calcified. Aortic valve regurgitation is mild. No aortic stenosis is present. Pulmonic Valve: The pulmonic valve was normal in structure. Pulmonic valve regurgitation is mild. No evidence of pulmonic stenosis. Aorta: The aortic root is normal in size and structure. Venous: The inferior vena cava is normal in size with greater than 50% respiratory variability, suggesting right atrial pressure of 3 mmHg. IAS/Shunts: No atrial level shunt detected by color flow Doppler.  LEFT VENTRICLE PLAX 2D LVIDd:         5.30 cm      Diastology LVIDs:         4.50 cm      LV e' medial:    2.31 cm/s LV PW:         1.30 cm      LV E/e' medial:  35.6 LV IVS:        1.10 cm      LV e' lateral:   5.80 cm/s LVOT  diam:     2.00 cm      LV E/e' lateral: 14.2 LV SV:         48 LV SV Index:   27 LVOT Area:     3.14 cm  LV Volumes (MOD) LV vol d, MOD A2C: 123.0 ml LV vol d, MOD A4C: 141.0 ml LV vol s, MOD A2C: 80.0 ml LV vol s, MOD A4C: 90.1 ml LV SV MOD A2C:     43.0 ml LV SV MOD A4C:     141.0 ml LV SV MOD BP:      49.3 ml RIGHT VENTRICLE RV Basal diam:  4.40 cm RV Mid diam:    3.10 cm RV S prime:     4.78 cm/s TAPSE (M-mode): 1.0 cm LEFT ATRIUM              Index        RIGHT ATRIUM           Index LA diam:        5.10 cm  2.91 cm/m   RA Area:     32.30 cm LA Vol (A2C):   128.5 ml 73.43 ml/m  RA Volume:   123.00 ml 70.28 ml/m LA Vol (A4C):   105.4 ml 60.23 ml/m LA Biplane Vol: 126.0 ml 72.00 ml/m  AORTIC VALVE LVOT Vmax:   84.10 cm/s LVOT Vmean:  59.100 cm/s LVOT VTI:    0.152 m  AORTA Ao Root diam: 3.30 cm Ao Asc diam:  3.60 cm MITRAL VALVE                  TRICUSPID VALVE MV Area (PHT): 6.96 cm       TR Peak grad:   65.0 mmHg MV Decel Time: 109 msec       TR Mean grad:   38.0 mmHg MR Peak grad:    70.6 mmHg    TR Vmax:        403.00 cm/s MR Mean grad:    51.0 mmHg    TR Vmean:       283.0 cm/s MR Vmax:         420.00 cm/s MR Vmean:        339.0 cm/s   SHUNTS MR PISA:         1.27 cm     Systemic VTI:  0.15 m MR PISA  Eff ROA: 12 mm       Systemic Diam: 2.00 cm MR PISA Radius:  0.45 cm MV E velocity: 82.30 cm/s Donato Schultz MD Electronically signed by Donato Schultz MD Signature Date/Time: 07/16/2023/2:48:23 PM    Final     ------  Assessment:  89 y.o. male with clot obstruction of urine following traumatic Foley insertion  Recommendations: #Hematuria #Traumatic foley placement Place hematuria catheter Hand irrigate CBI Trend labs Hold Eliquis is medically appropriate Please use purewick catheter in the future for urinary management unless indwelling Foley is clearly indicated.  Case and plan discussed with Dr. Marisue Brooklyn, NP Pager: [phone]   Please contact the urology consult  pager with any further questions/concerns.

## 2023-07-18 NOTE — Plan of Care (Signed)
  Problem: Safety: Goal: Ability to remain free from injury will improve Outcome: Not Progressing   

## 2023-07-18 NOTE — Plan of Care (Signed)

## 2023-07-19 DIAGNOSIS — J9621 Acute and chronic respiratory failure with hypoxia: Secondary | ICD-10-CM | POA: Diagnosis not present

## 2023-07-19 MED ORDER — SODIUM CHLORIDE 0.9 % IR SOLN
3000.0000 mL | Status: DC
  Administered 2023-07-19 – 2023-07-20 (×2): 3000 mL

## 2023-07-19 MED ORDER — MIDODRINE HCL 5 MG PO TABS
2.5000 mg | ORAL_TABLET | Freq: Three times a day (TID) | ORAL | Status: DC
  Administered 2023-07-19 – 2023-07-25 (×19): 2.5 mg via ORAL
  Filled 2023-07-19 (×19): qty 1

## 2023-07-19 NOTE — Progress Notes (Signed)
Rounding Note    Patient Name: [name] Date of Encounter: 07/19/2023  [facility] Cardiologist: Norman Herrlich, MD    Subjective   89 year old gentleman with a history of coronary artery disease status post PCI.  He has a also has a history of hypertension, chronic systolic and diastolic congestive heart failure, permanent atrial fibrillation, CKD, advanced dementia.  Patient is unable to give any history.  He was admitted with worsening congestive heart failure and elevated troponin level.  Has been diuresed 2.5 liters since admission     He has chronic atrial fibrillation.  He had a rapid ventricular response yesterday.  His metoprolol was increased to 50 mg a day and his rates are much better controlled today.  Talked with is daughter and her husband today  He has generally declined in health over the past year    Inpatient Medications    Scheduled Meds:  calcium carbonate  1 tablet Oral Daily   Chlorhexidine Gluconate Cloth  6 each Topical Daily   donepezil  10 mg Oral QHS   escitalopram  5 mg Oral Daily   gabapentin  100 mg Oral Q48H   levothyroxine  25 mcg Oral Q0600   lidocaine  1 patch Transdermal Q24H   metoprolol succinate  50 mg Oral Daily   psyllium  1 packet Oral Daily   senna  1 tablet Oral BID   simvastatin  10 mg Oral QHS   tamsulosin  0.4 mg Oral Daily   Continuous Infusions:  PRN Meds: melatonin, ondansetron **OR** ondansetron (ZOFRAN) IV, oxyCODONE, sorbitol   Vital Signs    Vitals:   07/18/23 1918 07/18/23 2241 07/19/23 0245 07/19/23 0628  BP: (!) 73/46 98/63 101/69 116/77  Pulse:  84 75 74  Resp: 13 20 20 20   Temp:  98.1 F (36.7 C) 98.1 F (36.7 C) 98.2 F (36.8 C)  TempSrc:  Oral Oral Oral  SpO2:  100% 98% 94%  Weight:      Height:        Intake/Output Summary (Last 24 hours) at 07/19/2023 1028 Last data filed at 07/19/2023 1012 Gross per 24 hour  Intake 6639.69 ml  Output 6330 ml  Net 309.69 ml       07/15/2023    1:10 PM 06/30/2023    9:54 PM 08/05/2022    1:37 PM  Last 3 Weights  Weight (lbs) 149 lb 14.6 oz 149 lb 14.6 oz 150 lb  Weight (kg) 68 kg 68 kg 68.04 kg      Telemetry    Atrial fib  - Personally Reviewed  ECG     - Personally Reviewed  Physical Exam   GEN: elderly man,     Neck: No JVD Cardiac: irreg. Irreg.  Respiratory: Clear to auscultation bilaterally. GI: Soft, nontender, non-distended  MS: 1 + edema  Neuro:  Nonfocal  Psych: Normal affect   Labs    High Sensitivity Troponin:   Recent Labs  Lab 06/30/23 2036 06/30/23 2243 07/15/23 1432 07/15/23 1652  TROPONINIHS 72* 67* 104* 117*     Chemistry Recent Labs  Lab 07/16/23 0631 07/17/23 0415 07/18/23 0855 07/18/23 2004 07/19/23 0515  NA 135   < > 134* 130* 133*  K 4.5   < > 4.0 4.5 4.4  CL 100   < > 101 99 100  CO2 23   < > 23 25 23   GLUCOSE 126*   < > 105* 115* 97  BUN 59*   < >  67* 69* 70*  CREATININE 2.03*   < > 1.92* 1.95* 1.73*  CALCIUM 9.4   < > 8.4* 8.2* 8.5*  MG 1.7  --   --   --   --   PROT  --   --   --  5.0*  --   ALBUMIN  --   --   --  2.4*  --   AST  --   --   --  27  --   ALT  --   --   --  18  --   ALKPHOS  --   --   --  98  --   BILITOT  --   --   --  0.9  --   GFRNONAA 30*   < > 32* 31* 36*  ANIONGAP 12   < > 10 6 10    < > = values in this interval not displayed.    Lipids No results for input(s): "CHOL", "TRIG", "HDL", "LABVLDL", "LDLCALC", "CHOLHDL" in the last 168 hours.  Hematology Recent Labs  Lab 07/18/23 0855 07/18/23 2004 07/19/23 0515  WBC 8.7 7.7 8.3  RBC 2.85* 2.71* 3.04*  HGB 8.6* 8.2* 9.0*  HCT 26.7* 25.2* 28.0*  MCV 93.7 93.0 92.1  MCH 30.2 30.3 29.6  MCHC 32.2 32.5 32.1  RDW 16.9* 17.1* 17.1*  PLT 141* 132* 156   Thyroid  Recent Labs  Lab 07/15/23 1757 07/16/23 0631  TSH 19.276*  --   FREET4  --  0.90    BNP Recent Labs  Lab 07/15/23 1432  BNP 1,862.0*    DDimer  Recent Labs  Lab 07/16/23 0631  DDIMER 2.49*      Radiology    No results found.  Cardiac Studies      Patient Profile     89 y.o. male    Assessment & Plan    1.  Atrial fibrillation: Eliquis is currently on hold because of his hematuria.  Rate is well-controlled on current dose of metoprolol.  2.  Acute on chronic combined systolic and diastolic congestive heart failure: Continue current medications.  3.  Hematuria: He is currently getting bladder irrigation.  Further plans per urology and hospitalist team.  4.  Elevated troponin levels. Will continue to observe.  I suspect this was demand ischemia.  We have no plans for any ischemic workup at this time.     For questions or updates, please contact [facility] Please consult [email or web address] for contact info under        Signed, Kristeen Miss, MD  07/19/2023, 10:28 AM

## 2023-07-19 NOTE — Progress Notes (Signed)
    Patient Name: [name]           DOB: Aug 06, 1925  MRN: [id number]      Admission Date: 07/15/2023  Attending Provider: Lanae Boast, MD  Primary Diagnosis: Acute on chronic respiratory failure with hypoxia [facility])   Level of care: Telemetry    CROSS COVER NOTE   Date of Service   07/19/2023   [name], 89 y.o. male, was admitted on 07/15/2023 for Acute on chronic respiratory failure with hypoxia (HCC).    HPI/Events of Note   Hypotensive SBP 70-80 with MAP ~55, previously received 5 mg midodrine and 500 cc bolus. Other vitals WNL.  No other acute changes reported. At bedside, patient is resting well.  Easy to arouse, alert only to self (baseline). No distress noted at this time. Echo- low EF 20-25% -- will need to be cautious regarding fluid administration.  No respiratory distress at this time.  No accessory muscle use.  Bilaterally clear breath sounds.  Will add an additional 250 cc bolus to be administered over the next 2 hours. Patient still having hematuria.  Urology following.  Will check hemoglobin.   Interventions/ Plan   Fluid bolus, 250 cc Midodrine, 5 mg Hemoglobin 8.6--> 8.2        Anthoney Harada, DNP, [employer]

## 2023-07-19 NOTE — Progress Notes (Signed)
  Progress Note   Patient: [name] ZOX:[id number] DOB: 10-30-1925 DOA: 07/15/2023     4 DOS: the patient was seen and examined on 07/19/2023 at 3:16PM      Brief hospital course: [name] is a 89 y.o. M with dementia, pAF on Eliquis, CAD s/p remote PCI, and CKD who presented from SNF for decreased mentation, decreased responsiveness.  In the ER, SpO2 80s, BNP elevated, CXR showed edema. Admitted on diuretics.  Developed gross hematuria after foley insertion in the ER.      Assessment and Plan: Acute respiratory failure with hypoxia due to acute on chronic systolic and diastolic congestive heart failure Admitted and Cardiology consulted.  Started on diuresis and diuresed 5.6L.    Hypotensive overnight, so Lasix held, midodrine given x1.  Still on 2L - Hold diuretics for now given hypotension - Hold home Entresto, torsemide, Toprol - Start midodrine  - Consult Cardiology, appreciate cares   Dementia -Continue donepezil, Lexapro   Acute kidney injury superimposed on chronic kidney disease stage IIIb Cr 2.1 on admission, baseline 1.5.  Improved now to 1.7 with diuresis. - Trend Cr  Gross hematuria -Continue CBI - Consult urology, appreciate cares - Continue Flomax  Permanent atrial fibrillation -Hold Eliquis due to hematuria - Hold Toprol - Start midodrine and resume metoprolol tomorrow if BP allows - Appreciate cardiology recommendations  Elevated TSH Suspect euthyroid sick.  Free T4 normal. -Recheck TSH in 6 weeks  Anemia Hemoglobin up to 9 - Trend hemoglobin  Coronary disease Demand ischemia Ischemia due to congestive heart failure, no further work up needed -Continue simvastatin         Subjective: Patient is confused and unable to provide any meaningful history.  He reports some shortness of breath.  No fever, no respiratory distress.  No nursing concerns.     Physical Exam: BP (!) 98/50 (BP Location: Left Arm)   Pulse 86   Temp  98.4 F (36.9 C) (Oral)   Resp 16   Ht 5\' 5"  (1.651 m)   Wt 68 kg   SpO2 93%   BMI 24.95 kg/m   Frail elderly male, lying in bed, interactive and appropriate Heart rhythm irregular, no murmurs, no pitting lower extremity edema Respiratory rate seems normal, lung sounds diminished, no rales or wheezes Abdomen soft without grimace to palpation, face symmetric, speech fluent Has advanced dementia     Data Reviewed: Discussed with cardiology Patient metabolic panel shows sodium stable, potassium normal Creatinine stable at 1.7 CBC shows hemoglobin up to 9     Disposition: Status is: Inpatient The patient was admitted with new hypoxia due to congestive heart failure  He was diuresed, but has developed hypotension so we are backing off diuresis today  Start midodrine, monitor blood pressure and adjust diuretics as able with cardiology  If urology remove his CBI, and cardiology finalize his diuretic regimen, possibly home by Monday or Tuesday        Author: Alberteen Sam, MD 07/19/2023 7:04 PM  For on call review [email or web address].

## 2023-07-19 NOTE — Progress Notes (Signed)
Urology Inpatient Progress Note  Subjective: Foley in place draining clear urine-min cbi  Anti-infectives: Anti-infectives (From admission, onward)    None       Current Facility-Administered Medications  Medication Dose Route Frequency Provider Last Rate Last Admin   calcium carbonate (TUMS - dosed in mg elemental calcium) chewable tablet 200 mg of elemental calcium  1 tablet Oral Daily Buena Irish, MD   200 mg of elemental calcium at 07/19/23 1129   Chlorhexidine Gluconate Cloth 2 % PADS 6 each  6 each Topical Daily Marlin Canary U, DO   6 each at 07/19/23 1130   donepezil (ARICEPT) tablet 10 mg  10 mg Oral QHS Buena Irish, MD   10 mg at 07/18/23 2016   escitalopram (LEXAPRO) tablet 5 mg  5 mg Oral Daily Buena Irish, MD   5 mg at 07/19/23 1126   gabapentin (NEURONTIN) capsule 100 mg  100 mg Oral Q48H Buena Irish, MD   100 mg at 07/17/23 2234   levothyroxine (SYNTHROID) tablet 25 mcg  25 mcg Oral Q0600 Buena Irish, MD   25 mcg at 07/19/23 0619   lidocaine (LIDODERM) 5 % 1 patch  1 patch Transdermal Q24H Buena Irish, MD       melatonin tablet 3 mg  3 mg Oral QHS PRN Buena Irish, MD   3 mg at 07/16/23 0035   ondansetron (ZOFRAN) tablet 4 mg  4 mg Oral Q6H PRN Buena Irish, MD       Or   ondansetron [facility]) injection 4 mg  4 mg Intravenous Q6H PRN Buena Irish, MD       oxyCODONE (Oxy IR/ROXICODONE) immediate release tablet 2.5 mg  2.5 mg Oral Q8H PRN Buena Irish, MD       psyllium (HYDROCIL/METAMUCIL) 1 packet  1 packet Oral Daily Buena Irish, MD   1 packet at 07/18/23 9629   senna (SENOKOT) tablet 8.6 mg  1 tablet Oral BID Buena Irish, MD   8.6 mg at 07/18/23 2017   simvastatin (ZOCOR) tablet 10 mg  10 mg Oral QHS Buena Irish, MD   10 mg at 07/18/23 2014   sorbitol 70 % solution 30 mL  30 mL Oral Daily PRN Buena Irish, MD       tamsulosin [facility]) capsule 0.4 mg  0.4 mg Oral Daily  Buena Irish, MD   0.4 mg at 07/19/23 1128     Objective: Vital signs in last 24 hours: Temp:  [97.8 F (36.6 C)-98.4 F (36.9 C)] 98.4 F (36.9 C) (08/03 1500) Pulse Rate:  [72-86] 86 (08/03 1500) Resp:  [13-20] 16 (08/03 1108) BP: (72-116)/(46-77) 98/50 (08/03 1500) SpO2:  [93 %-100 %] 93 % (08/03 1500)  Intake/Output from previous day: 08/02 0701 - 08/03 0700 In: 6239.7 [IV Piggyback:199.7] Out: 5900 [Urine:5900] Intake/Output this shift: Total I/O In: 620 [P.O.:580; Other:40] Out: 3480 [Urine:3480]  GENERAL APPEARANCE:  Well appearing, well developed, well nourished, NAD HEENT:  Atraumatic, normocephalic, oropharynx clear NECK:  Supple without lymphadenopathy or thyromegaly ABDOMEN:  Soft, non-tender, no masses EXTREMITIES:  Moves all extremities well, without clubbing, cyanosis, or edema NEUROLOGIC:  Alert and oriented x 3, normal gait, CN II-XII grossly intact MENTAL STATUS:  appropriate BACK:  Non-tender to palpation, No CVAT SKIN:  Warm, dry, and intact   Lab Results:  Recent Labs    07/18/23 2004 07/19/23 0515  WBC 7.7 8.3  HGB 8.2* 9.0*  HCT 25.2* 28.0*  PLT 132* 156   BMET Recent Labs  07/18/23 2004 07/19/23 0515  NA 130* 133*  K 4.5 4.4  CL 99 100  CO2 25 23  GLUCOSE 115* 97  BUN 69* 70*  CREATININE 1.95* 1.73*  CALCIUM 8.2* 8.5*   PT/INR No results for input(s): "LABPROT", "INR" in the last 72 hours. ABG No results for input(s): "PHART", "HCO3" in the last 72 hours.  Invalid input(s): "PCO2", "PO2"  Studies/Results: No results found.   Assessment & Plan: H/o traumatic foley and gross hematuria Foley now draining clear on min CBI Will leave in for another 24-48 yrs prior to removal   Joline Maxcy, MD 07/19/2023

## 2023-07-20 DIAGNOSIS — I5043 Acute on chronic combined systolic (congestive) and diastolic (congestive) heart failure: Secondary | ICD-10-CM

## 2023-07-20 DIAGNOSIS — J9621 Acute and chronic respiratory failure with hypoxia: Secondary | ICD-10-CM | POA: Diagnosis not present

## 2023-07-20 DIAGNOSIS — I25119 Atherosclerotic heart disease of native coronary artery with unspecified angina pectoris: Secondary | ICD-10-CM

## 2023-07-20 DIAGNOSIS — R5383 Other fatigue: Secondary | ICD-10-CM | POA: Diagnosis not present

## 2023-07-20 DIAGNOSIS — E8770 Fluid overload, unspecified: Secondary | ICD-10-CM | POA: Diagnosis not present

## 2023-07-20 DIAGNOSIS — I480 Paroxysmal atrial fibrillation: Secondary | ICD-10-CM

## 2023-07-20 DIAGNOSIS — I4811 Longstanding persistent atrial fibrillation: Secondary | ICD-10-CM | POA: Diagnosis not present

## 2023-07-20 DIAGNOSIS — N138 Other obstructive and reflux uropathy: Secondary | ICD-10-CM

## 2023-07-20 DIAGNOSIS — N401 Enlarged prostate with lower urinary tract symptoms: Secondary | ICD-10-CM

## 2023-07-20 LAB — CBC
HCT: 26.1 % — ABNORMAL LOW (ref 39.0–52.0)
Hemoglobin: 8 g/dL — ABNORMAL LOW (ref 13.0–17.0)
MCH: 29.2 pg (ref 26.0–34.0)
MCHC: 30.7 g/dL (ref 30.0–36.0)
MCV: 95.3 fL (ref 80.0–100.0)
Platelets: 145 10*3/uL — ABNORMAL LOW (ref 150–400)
RBC: 2.74 MIL/uL — ABNORMAL LOW (ref 4.22–5.81)
RDW: 17.1 % — ABNORMAL HIGH (ref 11.5–15.5)
WBC: 7.1 10*3/uL (ref 4.0–10.5)
nRBC: 0 % (ref 0.0–0.2)

## 2023-07-20 LAB — BASIC METABOLIC PANEL WITH GFR
Anion gap: 9 (ref 5–15)
BUN: 67 mg/dL — ABNORMAL HIGH (ref 8–23)
CO2: 21 mmol/L — ABNORMAL LOW (ref 22–32)
Calcium: 8.5 mg/dL — ABNORMAL LOW (ref 8.9–10.3)
Chloride: 101 mmol/L (ref 98–111)
Creatinine, Ser: 1.58 mg/dL — ABNORMAL HIGH (ref 0.61–1.24)
GFR, Estimated: 40 mL/min — ABNORMAL LOW (ref >60)
Glucose, Bld: 103 mg/dL — ABNORMAL HIGH (ref 70–99)
Potassium: 4.1 mmol/L (ref 3.5–5.1)
Sodium: 131 mmol/L — ABNORMAL LOW (ref 135–145)

## 2023-07-20 NOTE — Plan of Care (Signed)
  Problem: Health Behavior/Discharge Planning: Goal: Ability to manage health-related needs will improve Outcome: Progressing   Problem: Clinical Measurements: Goal: Respiratory complications will improve Outcome: Progressing   Problem: Coping: Goal: Level of anxiety will decrease Outcome: Progressing   Problem: Elimination: Goal: Will not experience complications related to bowel motility Outcome: Progressing   Problem: Safety: Goal: Ability to remain free from injury will improve Outcome: Progressing   Problem: Education: Goal: Knowledge of General Education information will improve Description: Including pain rating scale, medication(s)/side effects and non-pharmacologic comfort measures Outcome: Not Progressing   Problem: Activity: Goal: Risk for activity intolerance will decrease Outcome: Not Progressing   Problem: Clinical Measurements: Goal: Ability to maintain clinical measurements within normal limits will improve Outcome: Adequate for Discharge Goal: Will remain free from infection Outcome: Adequate for Discharge Goal: Diagnostic test results will improve Outcome: Adequate for Discharge Goal: Cardiovascular complication will be avoided Outcome: Adequate for Discharge   Problem: Nutrition: Goal: Adequate nutrition will be maintained Outcome: Adequate for Discharge   Problem: Pain Managment: Goal: General experience of comfort will improve Outcome: Adequate for Discharge   Problem: Skin Integrity: Goal: Risk for impaired skin integrity will decrease Outcome: Adequate for Discharge

## 2023-07-20 NOTE — Progress Notes (Signed)
Rounding Note    Patient Name: [name] Date of Encounter: 07/20/2023  [facility] Cardiologist: Norman Herrlich, MD    Subjective   89 year old gentleman with a history of coronary artery disease status post PCI.  He has a also has a history of hypertension, chronic systolic and diastolic congestive heart failure, permanent atrial fibrillation, CKD, advanced dementia.  Patient is unable to give any history.  He was admitted with worsening congestive heart failure and elevated troponin level.  Has been diuresed 10.1  liters since admission   ( this is significantly more than he had yesterday - his I/O may not be correct. )        Inpatient Medications    Scheduled Meds:  calcium carbonate  1 tablet Oral Daily   Chlorhexidine Gluconate Cloth  6 each Topical Daily   donepezil  10 mg Oral QHS   escitalopram  5 mg Oral Daily   gabapentin  100 mg Oral Q48H   levothyroxine  25 mcg Oral Q0600   lidocaine  1 patch Transdermal Q24H   midodrine  2.5 mg Oral TID WC   psyllium  1 packet Oral Daily   senna  1 tablet Oral BID   simvastatin  10 mg Oral QHS   tamsulosin  0.4 mg Oral Daily   Continuous Infusions:  sodium chloride irrigation     PRN Meds: melatonin, ondansetron **OR** ondansetron (ZOFRAN) IV, oxyCODONE, sorbitol   Vital Signs    Vitals:   07/19/23 1500 07/19/23 2022 07/20/23 0643 07/20/23 0918  BP: (!) 98/50 100/70 96/64 (!) 95/57  Pulse: 86 86 60 74  Resp:    18  Temp: 98.4 F (36.9 C) 98 F (36.7 C) 98.9 F (37.2 C) 97.8 F (36.6 C)  TempSrc: Oral Oral Oral Oral  SpO2: 93% 98% 93% (!) 86%  Weight:      Height:        Intake/Output Summary (Last 24 hours) at 07/20/2023 0922 Last data filed at 07/20/2023 3295 Gross per 24 hour  Intake 980 ml  Output 8305 ml  Net -7325 ml      07/15/2023    1:10 PM 06/30/2023    9:54 PM 08/05/2022    1:37 PM  Last 3 Weights  Weight (lbs) 149 lb 14.6 oz 149 lb 14.6 oz 150 lb  Weight (kg) 68 kg 68 kg  68.04 kg      Telemetry    Afib with controlled V response - Personally Reviewed  ECG     - Personally Reviewed  Physical Exam   Physical Exam: Blood pressure (!) 95/57, pulse 74, temperature 97.8 F (36.6 C), temperature source Oral, resp. rate 18, height 5\' 5"  (1.651 m), weight 68 kg, SpO2 (!) 86%.       GEN:  chronically ill appearing man HEENT: Normal NECK: No JVD; No carotid bruits LYMPHATICS: No lymphadenopathy CARDIAC: irreg. Irreg.  RESPIRATORY:  Clear to auscultation without rales, wheezing or rhonchi  ABDOMEN: Soft, non-tender, non-distended MUSCULOSKELETAL:  No edema; No deformity  SKIN: Warm and dry NEUROLOGIC:  unable to assess,  significant dementia     Labs    High Sensitivity Troponin:   Recent Labs  Lab 06/30/23 2036 06/30/23 2243 07/15/23 1432 07/15/23 1652  TROPONINIHS 72* 67* 104* 117*     Chemistry Recent Labs  Lab 07/16/23 0631 07/17/23 0415 07/18/23 2004 07/19/23 0515 07/20/23 0505  NA 135   < > 130* 133* 131*  K 4.5   < >  4.5 4.4 4.1  CL 100   < > 99 100 101  CO2 23   < > 25 23 21*  GLUCOSE 126*   < > 115* 97 103*  BUN 59*   < > 69* 70* 67*  CREATININE 2.03*   < > 1.95* 1.73* 1.58*  CALCIUM 9.4   < > 8.2* 8.5* 8.5*  MG 1.7  --   --   --   --   PROT  --   --  5.0*  --   --   ALBUMIN  --   --  2.4*  --   --   AST  --   --  27  --   --   ALT  --   --  18  --   --   ALKPHOS  --   --  98  --   --   BILITOT  --   --  0.9  --   --   GFRNONAA 30*   < > 31* 36* 40*  ANIONGAP 12   < > 6 10 9    < > = values in this interval not displayed.    Lipids No results for input(s): "CHOL", "TRIG", "HDL", "LABVLDL", "LDLCALC", "CHOLHDL" in the last 168 hours.  Hematology Recent Labs  Lab 07/18/23 2004 07/19/23 0515 07/20/23 0505  WBC 7.7 8.3 7.1  RBC 2.71* 3.04* 2.74*  HGB 8.2* 9.0* 8.0*  HCT 25.2* 28.0* 26.1*  MCV 93.0 92.1 95.3  MCH 30.3 29.6 29.2  MCHC 32.5 32.1 30.7  RDW 17.1* 17.1* 17.1*  PLT 132* 156 145*   Thyroid   Recent Labs  Lab 07/15/23 1757 07/16/23 0631  TSH 19.276*  --   FREET4  --  0.90    BNP Recent Labs  Lab 07/15/23 1432  BNP 1,862.0*    DDimer  Recent Labs  Lab 07/16/23 0631  DDIMER 2.49*     Radiology    No results found.  Cardiac Studies      Patient Profile     89 y.o. male    Assessment & Plan    1.  Atrial fibrillation:  HR is well controlled.   He is off eliquis temporarily for tramatic foley removal Restart eliquis when ok with urology .  2.  Acute on chronic combined systolic and diastolic congestive heart failure: Continue current medications.  3.  Hematuria: He is currently getting bladder irrigation.  Further plans per urology and hospitalist team.  4.  Elevated troponin levels. Will continue to observe.  I suspect this was demand ischemia.  We have no plans for any ischemic workup at this time.  [facility] will sign off.   Medication Recommendations:   Other recommendations (labs, testing, etc):   Follow up as an outpatient:  with primary MD and with Dr. Dulce Sellar .     For questions or updates, please contact [facility] Please consult [email or web address] for contact info under        Signed, Kristeen Miss, MD  07/20/2023, 9:22 AM

## 2023-07-20 NOTE — Progress Notes (Signed)
Progress Note   Patient: [name] ZOX:[id number] DOB: Aug 31, 1925 DOA: 07/15/2023     5 DOS: the patient was seen and examined on 07/20/2023   Brief hospital course: 89 y.o.m w/ significant for history of CAD- W/ stents, PAF on Eliquis, CKD, pleural effusions, and severe dementia who was sent in from the [place] nursing home because he has been sleeping a lot more lethargic which is not his baseline.  At baseline he "races" around the nursing facility in his wheelchair and he can stand up and go to the bathroom independently.  In ED: O2 sats were in the mid 80s on room air.  Once he was put on oxygen he was much less lethargic as a matter fact he got pretty agitated in the ED. Labs significant for elevated BNP 1862, troponin  67> 104>117, creatinine at 1.7>2.0, CBC stable without leukocytosis. Patient is admitted to hospitalist service with impression of  respirator yfailure due to CHF exacerbation.  Assessment and Plan: Acute respiratory failure with hypoxia due to acute on chronic systolic and diastolic congestive heart failure Admitted and Cardiology consulted.  Did well with diuresis.  Hypotensive overnight, so Lasix held, midodrine given x1.  continue supplemental o2 as needed to maintain saturation above 92% Hold diuretics for now given hypotension. Hold home Entresto, torsemide, Toprol Midodrine 2.5 mg tid.   Dementia Continue donepezil, Lexapro Continue delirium precautions. Fall, aspiration precautions.   Acute kidney injury superimposed on chronic kidney disease stage IIIb Cr 2.1 on admission, baseline 1.5.  Improved now to 1.5 with diuresis. Monitor daily renal function.   Gross hematuria Urology follow up appreciated. Continue Flomax   Permanent atrial fibrillation Hold Eliquis due to hematuria Hold Toprol Will start metoprolol if BP allows Appreciate cardiology recommendations   Elevated TSH Suspect euthyroid sick.  Free T4 normal. Recheck TSH in 6 weeks    Anemia Hemoglobin up to 9 Trend hemoglobin   Coronary disease Demand ischemia Ischemia due to congestive heart failure, no further work up needed Continue simvastatin         Subjective: Patient is seen and examined today morning. He is lying comfortably. No apparent distress. Unable to follow commands.  Physical Exam: Vitals:   07/19/23 1500 07/19/23 2022 07/20/23 0643 07/20/23 0918  BP: (!) 98/50 100/70 96/64 (!) 95/57  Pulse: 86 86 60 74  Resp:    18  Temp: 98.4 F (36.9 C) 98 F (36.7 C) 98.9 F (37.2 C) 97.8 F (36.6 C)  TempSrc: Oral Oral Oral Oral  SpO2: 93% 98% 93% (!) 86%  Weight:      Height:       General - Elderly Caucasian male, sleeping, no apparent distress HEENT - PERRLA, EOMI, atraumatic head, non tender sinuses. Lung - Clear, rales, rhonchi, wheezes. Heart - S1, S2 heard, no murmurs, rubs, trace pedal edema. Abdomen soft, nontender, ostomy bag noted Neuro - Sleeping, did not follow commands, non focal exam. Skin - Warm and dry.  Data Reviewed:     Latest Ref Rng & Units 07/20/2023    5:05 AM 07/19/2023    5:15 AM 07/18/2023    8:04 PM  CBC  WBC 4.0 - 10.5 K/uL 7.1  8.3  7.7   Hemoglobin 13.0 - 17.0 g/dL 8.0  9.0  8.2   Hematocrit 39.0 - 52.0 % 26.1  28.0  25.2   Platelets 150 - 400 K/uL 145  156  132        Latest Ref Rng & Units  07/20/2023    5:05 AM 07/19/2023    5:15 AM 07/18/2023    8:04 PM  BMP  Glucose 70 - 99 mg/dL 161  97  096   BUN 8 - 23 mg/dL 67  70  69   Creatinine 0.61 - 1.24 mg/dL 0.45  4.09  8.11   Sodium 135 - 145 mmol/L 131  133  130   Potassium 3.5 - 5.1 mmol/L 4.1  4.4  4.5   Chloride 98 - 111 mmol/L 101  100  99   CO2 22 - 32 mmol/L 21  23  25    Calcium 8.9 - 10.3 mg/dL 8.5  8.5  8.2      Family Communication: Will contact family to discuss plan, palliative care.  Disposition: Status is: Inpatient Remains inpatient appropriate because: Low BP, diuresis as needed, supportive care.  Planned Discharge Destination:  Skilled nursing facility    Time spent: 44 minutes  Author: Marcelino Duster, MD 07/20/2023 11:43 AM  For on call review [email or web address].

## 2023-07-20 NOTE — Plan of Care (Signed)
  Problem: Nutrition: Goal: Adequate nutrition will be maintained Outcome: Progressing   Problem: Pain Managment: Goal: General experience of comfort will improve Outcome: Progressing   Problem: Safety: Goal: Ability to remain free from injury will improve Outcome: Progressing   

## 2023-07-21 DIAGNOSIS — I5043 Acute on chronic combined systolic (congestive) and diastolic (congestive) heart failure: Secondary | ICD-10-CM | POA: Diagnosis not present

## 2023-07-21 DIAGNOSIS — I4811 Longstanding persistent atrial fibrillation: Secondary | ICD-10-CM | POA: Diagnosis not present

## 2023-07-21 DIAGNOSIS — J9621 Acute and chronic respiratory failure with hypoxia: Secondary | ICD-10-CM | POA: Diagnosis not present

## 2023-07-21 MED ORDER — TORSEMIDE 20 MG PO TABS
20.0000 mg | ORAL_TABLET | Freq: Every day | ORAL | Status: DC
  Administered 2023-07-21 – 2023-07-25 (×5): 20 mg via ORAL
  Filled 2023-07-21 (×7): qty 1

## 2023-07-21 NOTE — Plan of Care (Signed)
  Problem: Health Behavior/Discharge Planning: Goal: Ability to manage health-related needs will improve Outcome: Progressing   Problem: Clinical Measurements: Goal: Respiratory complications will improve Outcome: Progressing   Problem: Activity: Goal: Risk for activity intolerance will decrease Outcome: Progressing   Problem: Nutrition: Goal: Adequate nutrition will be maintained Outcome: Progressing   Problem: Coping: Goal: Level of anxiety will decrease Outcome: Progressing   Problem: Pain Managment: Goal: General experience of comfort will improve Outcome: Progressing

## 2023-07-21 NOTE — Progress Notes (Signed)
Heart Failure Navigator Progress Note  Assessed for Heart & Vascular TOC clinic readiness.  Patient does not meet criteria due to per MD note patient with Dementia. .   Navigator will sign off at this time.   Rhae Hammock, BSN, Scientist, clinical (histocompatibility and immunogenetics) Only

## 2023-07-21 NOTE — Progress Notes (Signed)
Progress Note   Patient: [name] YNW:[id number] DOB: July 12, 1925 DOA: 07/15/2023     6 DOS: the patient was seen and examined on 07/21/2023   Brief hospital course: 89 y.o.m w/ significant for history of CAD- W/ stents, PAF on Eliquis, CKD, pleural effusions, and severe dementia who was sent in from the [facility] nursing home because he has been sleeping a lot more lethargic which is not his baseline.  At baseline he "races" around the nursing facility in his wheelchair and he can stand up and go to the bathroom independently.  In ED: O2 sats were in the mid 80s on room air.  Once he was put on oxygen he was much less lethargic as a matter fact he got pretty agitated in the ED. Labs significant for elevated BNP 1862, troponin  67> 104>117, creatinine at 1.7>2.0, CBC stable without leukocytosis. Patient is admitted to hospitalist service with impression of  respirator yfailure due to CHF exacerbation.  Assessment and Plan: Acute respiratory failure with hypoxia due to acute on chronic systolic and diastolic congestive heart failure Did well with diuresis. But due to hypotensive, renal dysfunction, IV Lasix held, midodrine given x1. Continue supplemental o2 as needed to maintain saturation above 92% Hold home Entresto. Will resume home dose torsemide, continue midodrine 2.5 mg tid. Cardiology signed off, suggested palliative care given his advanced age, multiple comorbidities.   Dementia Continue donepezil, Lexapro Continue delirium precautions. Fall, aspiration precautions.   Acute kidney injury superimposed on chronic kidney disease stage IIIb Cr 2.1 on admission, baseline 1.5.  Improved now to 1.5, resume home diuresis regimen. Monitor daily renal function.   Gross hematuria Urology follow up appreciated. S/p bladder irrigation. Urine cleared. Continue Flomax. Foley removed. Continue voiding trial.   Permanent atrial fibrillation Hold Eliquis due to hematuria Hold Toprol due to  borderline low BP.   Elevated TSH Suspect euthyroid sick.  Free T4 normal. Recheck TSH in 6 weeks   Acute on chronic Anemia Hemoglobin around 8.  No active bleeding. Trend hemoglobin   Coronary disease Demand ischemia Ischemia due to congestive heart failure, no further work up needed     Subjective: Patient is seen and examined today morning. He is eating breakfast.  RN at bedside assisting him. Foley removed on voiding trial.  Physical Exam: Vitals:   07/20/23 1900 07/21/23 0359 07/21/23 0906 07/21/23 1445  BP: 102/78 (!) 90/58 109/78 96/60  Pulse: 70 79 85 85  Resp: 14  17 15   Temp: 97.9 F (36.6 C) 98.4 F (36.9 C) 98 F (36.7 C) 98.3 F (36.8 C)  TempSrc: Axillary Oral Oral Oral  SpO2: 97% 93% 95% 96%  Weight:      Height:       General - Elderly Caucasian male,  no apparent distress HEENT - PERRLA, EOMI, atraumatic head, non tender sinuses. Lung - Clear, rales, rhonchi, wheezes. Heart - S1, S2 heard, no murmurs, rubs, trace pedal edema. Abdomen soft, nontender, ostomy bag noted Neuro - Sleeping, did not follow commands, non focal exam. Skin - Warm and dry.  Data Reviewed:     Latest Ref Rng & Units 07/21/2023    4:07 AM 07/20/2023    5:05 AM 07/19/2023    5:15 AM  CBC  WBC 4.0 - 10.5 K/uL 6.4  7.1  8.3   Hemoglobin 13.0 - 17.0 g/dL 8.3  8.0  9.0   Hematocrit 39.0 - 52.0 % 26.2  26.1  28.0   Platelets 150 - 400 K/uL  136  145  156        Latest Ref Rng & Units 07/21/2023    4:07 AM 07/20/2023    5:05 AM 07/19/2023    5:15 AM  BMP  Glucose 70 - 99 mg/dL 97  161  97   BUN 8 - 23 mg/dL 61  67  70   Creatinine 0.61 - 1.24 mg/dL 0.96  0.45  4.09   Sodium 135 - 145 mmol/L 131  131  133   Potassium 3.5 - 5.1 mmol/L 4.4  4.1  4.4   Chloride 98 - 111 mmol/L 100  101  100   CO2 22 - 32 mmol/L 24  21  23    Calcium 8.9 - 10.3 mg/dL 8.6  8.5  8.5      Family Communication: Discussed with son over phone, agreed with palliative care consult for goals of care  discussion. Consult placed.  Disposition: Status is: Inpatient Remains inpatient appropriate because: Low BP, diuresis, supportive care, urinary voiding trial.  Planned Discharge Destination: Skilled nursing facility    Time spent: 46 minutes  Author: Marcelino Duster, MD 07/21/2023 3:55 PM  For on call review [email or web address].

## 2023-07-21 NOTE — Progress Notes (Signed)
Has not urinated as of 1:00pm. bladder scan despite fluid intake. Will place order for nursing to replace coude if he gets up to overnight without urinating

## 2023-07-21 NOTE — Progress Notes (Signed)
Assume care from day shift. Patient is confused, oriented to self and family member not on bedside, unable to finish admission documentation.

## 2023-07-21 NOTE — Plan of Care (Signed)
  Problem: Education: Goal: Knowledge of General Education information will improve Description: Including pain rating scale, medication(s)/side effects and non-pharmacologic comfort measures Outcome: Progressing   Problem: Health Behavior/Discharge Planning: Goal: Ability to manage health-related needs will improve Outcome: Progressing   Problem: Elimination: Goal: Will not experience complications related to bowel motility Outcome: Progressing   Problem: Activity: Goal: Risk for activity intolerance will decrease Outcome: Not Progressing   Problem: Elimination: Goal: Will not experience complications related to urinary retention Outcome: Not Progressing   Problem: Safety: Goal: Ability to remain free from injury will improve Outcome: Not Progressing

## 2023-07-21 NOTE — Progress Notes (Signed)
     Subjective: Pt resting on rounds. I did not wake him. NAEON  Objective: Vital signs in last 24 hours: Temp:  [97.7 F (36.5 C)-98.4 F (36.9 C)] 98.4 F (36.9 C) (08/05 0359) Pulse Rate:  [68-94] 79 (08/05 0359) Resp:  [13-18] 14 (08/04 1900) BP: (87-102)/(57-78) 90/58 (08/05 0359) SpO2:  [86 %-100 %] 93 % (08/05 0359)  Intake/Output from previous day: 08/04 0701 - 08/05 0700 In: 360 [P.O.:360] Out: 2525 [Urine:2525]  Intake/Output this shift: No intake/output data recorded.  Physical Exam:  General: Alert and oriented CV: No cyanosis Lungs: equal chest rise Gu: CBI plugged. Clear, yellow, urine  Lab Results: Recent Labs    07/19/23 0515 07/20/23 0505 07/21/23 0407  HGB 9.0* 8.0* 8.3*  HCT 28.0* 26.1* 26.2*   BMET Recent Labs    07/20/23 0505 07/21/23 0407  NA 131* 131*  K 4.1 4.4  CL 101 100  CO2 21* 24  GLUCOSE 103* 97  BUN 67* 61*  CREATININE 1.58* 1.55*  CALCIUM 8.5* 8.6*     Studies/Results: No results found.  Assessment/Plan: #traumatic foley placement #hematuria CBI off since yesterday. Clear yellow urine today. Will proceed with TOV. Renal function better than his baseline, good UOP.    LOS: 6 days   Elmon Kirschner, NP [facility] Pager: [phone]  07/21/2023, 8:01 AM

## 2023-07-21 NOTE — Plan of Care (Signed)
  Problem: Nutrition: Goal: Adequate nutrition will be maintained Outcome: Progressing   Problem: Safety: Goal: Ability to remain free from injury will improve Outcome: Progressing   Problem: Skin Integrity: Goal: Risk for impaired skin integrity will decrease Outcome: Progressing   

## 2023-07-22 DIAGNOSIS — J9621 Acute and chronic respiratory failure with hypoxia: Secondary | ICD-10-CM | POA: Diagnosis not present

## 2023-07-22 NOTE — Progress Notes (Signed)
     Subjective: [name]. Pt repeating that he has to pee.   Objective: Vital signs in last 24 hours: Temp:  [98 F (36.7 C)-98.3 F (36.8 C)] 98.2 F (36.8 C) (08/06 0500) Pulse Rate:  [77-85] 77 (08/06 0500) Resp:  [15-17] 17 (08/06 0500) BP: (96-111)/(56-82) 111/82 (08/06 0500) SpO2:  [94 %-97 %] 97 % (08/06 0500)  Intake/Output from previous day: 08/05 0701 - 08/06 0700 In: 960 [P.O.:960] Out: 650 [Urine:650]  Intake/Output this shift: No intake/output data recorded.  Physical Exam:  General: Alert and oriented CV: No cyanosis Lungs: equal chest rise GU: 1f coude with clear yellow urine.   Lab Results: Recent Labs    07/20/23 0505 07/21/23 0407 07/22/23 0417  HGB 8.0* 8.3* 9.1*  HCT 26.1* 26.2* 29.5*   BMET Recent Labs    07/21/23 0407 07/22/23 0417  NA 131* 131*  K 4.4 4.9  CL 100 99  CO2 24 23  GLUCOSE 97 110*  BUN 61* 63*  CREATININE 1.55* 1.49*  CALCIUM 8.6* 9.1     Studies/Results: No results found.  Assessment/Plan: #traumatic foley removal #hematuria- resolved TOV 8/5 with PVR's not above yesterday and overnight. Pt expressing discomfort on rounds this am. PVR of . 74f coude placed. returned. Leave foley in place through discharge. Continue Flomax. Please call with questions.    LOS: 7 days   Elmon Kirschner, NP [facility] Pager: [phone]  07/22/2023, 8:28 AM

## 2023-07-22 NOTE — Progress Notes (Signed)
PROGRESS NOTE  Demetrios Isaacs Kamp  DOB: 1925/01/28  PCP: Fransico Michael [facility] ONG:[id number]  DOA: 07/15/2023  LOS: 7 days  Hospital Day: 8  Brief narrative: [name] is a 89 y.o. male with PMH significant for advanced dementia HTN, HLD, CAD/MI/stents, permanent A-fib on Eliquis, CHF, CKD, colonic diverticulosis, BPH s/p prior TURP Patient is a long-term resident of [facility]  7/30, patient was sent to the ED for lethargy.  At baseline he "races" around the nursing facility in his wheelchair and he can stand up and go to the bathroom independently.   In ED: O2 sats was in the mid 80s on room air.  Once he was put on oxygen, lethargy improved and patient actually got agitated.   Labs significant for elevated BNP 1862, troponin  67> 104>117, creatinine at 1.7>2.0, CBC stable without leukocytosis. Admitted to [facility] for CHF exacerbation. Cardiology was consulted.  His hospital course was also complicated by gross hematuria likely due to traumatic Foley placement, required urology consultation.  Subjective: Patient was seen and examined this morning.  Pleasant elderly Caucasian male.  Lying on bed.  Opens eyes on command.  Unable to have conversation with me. Chart reviewed In the last 24 hours, afebrile, blood pressure in 90s and low 100s, breathing on room air Last set of blood work from this morning with hemoglobin 9.1, WBC count normal, creatinine improving at 1.49, sodium stable at 131  Assessment and plan: Acute respiratory failure with hypoxia  Acute on chronic combined systolic and diastolic CHF HypOtension Sent from nursing home for lethargy, hypoxia  Workup with echo showed EF low at 20 to 25%  Cardiology consultation was obtained.   Aggressively diuresed with IV Lasix.  Complicated by hypotension required midodrine. Currently on torsemide 20 mg daily, midodrine 2.5 mg 3 times daily PTA meds-also included Toprol and Entresto.  Currently on hold  because of hypotension. Cardiology signed off, suggested palliative care given his advanced age, multiple comorbidities. Net IO Since Admission: -12,065.31 mL [07/22/23 1646] Continue to monitor for daily intake output, weight, blood pressure, BNP, renal function and electrolytes. Recent Labs  Lab 07/16/23 0631 07/17/23 0415 07/18/23 2004 07/19/23 0515 07/20/23 0505 07/21/23 0407 07/22/23 0417  BUN 59*   < > 69* 70* 67* 61* 63*  CREATININE 2.03*   < > 1.95* 1.73* 1.58* 1.55* 1.49*  NA 135   < > 130* 133* 131* 131* 131*  K 4.5   < > 4.5 4.4 4.1 4.4 4.9  MG 1.7  --   --   --   --   --   --    < > = values in this interval not displayed.   Demand ischemia History of CAD Troponin elevated due to LV strain.  No ischemia workup at this time.   Permanent atrial fibrillation Toprol on hold due to hypotension  Eliquis on hold due to hematuria    Gross hematuria Due to traumatic Foley placement.  Urology was consulted S/p bladder irrigation. Urine cleared. Continue Flomax. Foley catheter continues to show hematuria.  Urology to follow-up  Acute on chronic Anemia Baseline hemoglobin above 10.  Hemoglobin acutely low between 8 and 9 due to hematuria.  Stable currently. Continue to monitor Recent Labs    07/18/23 2004 07/19/23 0515 07/20/23 0505 07/21/23 0407 07/22/23 0417  HGB 8.2* 9.0* 8.0* 8.3* 9.1*  MCV 93.0 92.1 95.3 94.6 96.4   AKI on CKD 3B Baseline creatinine 1.5.  Had presented with a creatinine elevated  to 2.03 gradually improving.  Advanced dementia Continue donepezil, Lexapro Continue delirium precautions. Fall, aspiration precautions.  Elevated TSH Suspect euthyroid sick.  Free T4 normal. Recheck TSH in 6 weeks   Mobility: Long-term nursing home patient  Goals of care   Code Status: DNR     DVT prophylaxis:  Place and maintain sequential compression device Start: 07/22/23 1647   Antimicrobials: None currently Fluid: None Consultants:  Urology Family Communication: None at bedside  Status: Inpatient Level of care:  Telemetry   Patient from: Long-term nursing home Anticipated d/c to: Back to long-term nursing home Needs to continue in-hospital care:  Continues to have hematuria.   Diet:  Diet Order             Diet Heart Room service appropriate? Yes; Fluid consistency: Thin  Diet effective now                   Scheduled Meds:  calcium carbonate  1 tablet Oral Daily   Chlorhexidine Gluconate Cloth  6 each Topical Daily   donepezil  10 mg Oral QHS   escitalopram  5 mg Oral Daily   gabapentin  100 mg Oral Q48H   levothyroxine  25 mcg Oral Q0600   lidocaine  1 patch Transdermal Q24H   midodrine  2.5 mg Oral TID WC   psyllium  1 packet Oral Daily   senna  1 tablet Oral BID   simvastatin  10 mg Oral QHS   tamsulosin  0.4 mg Oral Daily   torsemide  20 mg Oral Daily    PRN meds: melatonin, ondansetron **OR** ondansetron (ZOFRAN) IV, oxyCODONE, sorbitol   Infusions:   sodium chloride irrigation      Antimicrobials: Anti-infectives (From admission, onward)    None       Nutritional status:  Body mass index is 24.95 kg/m.          Objective: Vitals:   07/22/23 0500 07/22/23 1421  BP: 111/82 103/60  Pulse: 77 70  Resp: 17 15  Temp: 98.2 F (36.8 C) 99.4 F (37.4 C)  SpO2: 97% 99%    Intake/Output Summary (Last 24 hours) at 07/22/2023 1646 Last data filed at 07/22/2023 1431 Gross per 24 hour  Intake 1080 ml  Output 2100 ml  Net -1020 ml   Filed Weights   07/15/23 1310  Weight: 68 kg   Weight change:  Body mass index is 24.95 kg/m.   Physical Exam: General exam: Pleasant, elderly Caucasian male.  Not in physical distress Skin: No rashes, lesions or ulcers. HEENT: Atraumatic, normocephalic, no obvious bleeding Lungs: Clear to auscultation bilaterally CVS: Regular rate and rhythm, no murmur GI/Abd soft, nontender, nondistended, bowel sound present CNS: Somnolent, opens  eyes to command.  Unable to have conversation.  Dementia at baseline Psychiatry: Flat affect Extremities: No pedal, no calf tenderness  Data Review: I have personally reviewed the laboratory data and studies available.  F/u labs ordered Unresulted Labs (From admission, onward)     Start     Ordered   07/19/23 0500  Basic metabolic panel  Daily,   R     Question:  Specimen collection method  Answer:  Lab=Lab collect   07/18/23 0838   07/19/23 0500  CBC  Daily,   R     Question:  Specimen collection method  Answer:  Lab=Lab collect   07/18/23 0838            Total time spent in review of labs and  imaging, patient evaluation, formulation of plan, documentation and communication with family: 45 minutes  Signed, Lorin Glass, MD [facility] 07/22/2023

## 2023-07-23 ENCOUNTER — Encounter (HOSPITAL_COMMUNITY): Payer: Self-pay | Admitting: Internal Medicine

## 2023-07-23 DIAGNOSIS — J9621 Acute and chronic respiratory failure with hypoxia: Secondary | ICD-10-CM | POA: Diagnosis not present

## 2023-07-23 MED ORDER — TAMSULOSIN HCL 0.4 MG PO CAPS
0.4000 mg | ORAL_CAPSULE | Freq: Two times a day (BID) | ORAL | Status: DC
  Administered 2023-07-23 – 2023-07-25 (×6): 0.4 mg via ORAL
  Filled 2023-07-23 (×6): qty 1

## 2023-07-23 NOTE — Progress Notes (Signed)
     Subjective: NAEON. Pt repeating that he has to pee.   Objective: Vital signs in last 24 hours: Temp:  [98.7 F (37.1 C)-99.4 F (37.4 C)] 98.7 F (37.1 C) (08/07 0529) Pulse Rate:  [70-100] 89 (08/07 0529) Resp:  [15-19] 19 (08/07 0529) BP: (103-109)/(60-66) 104/64 (08/07 0529) SpO2:  [97 %-100 %] 97 % (08/07 0529)  Intake/Output from previous day: 08/06 0701 - 08/07 0700 In: 840 [P.O.:840] Out: 3900 [Urine:3900]  Intake/Output this shift: No intake/output data recorded.  Physical Exam:  General: Alert and oriented CV: No cyanosis Lungs: equal chest rise GU: 51f coude with clear yellow urine.   Lab Results: Recent Labs    07/21/23 0407 07/22/23 0417 07/23/23 0417  HGB 8.3* 9.1* 8.5*  HCT 26.2* 29.5* 26.3*   BMET Recent Labs    07/22/23 0417 07/23/23 0417  NA 131* 129*  K 4.9 4.7  CL 99 96*  CO2 23 21*  GLUCOSE 110* 99  BUN 63* 65*  CREATININE 1.49* 1.61*  CALCIUM 9.1 8.6*     Studies/Results: No results found.  Assessment/Plan: #traumatic foley removal #hematuria- resolved TOV 8/5 with PVR's not above . Spurious results from bladder scanned. Actual  PVR of on 8/6. Coude placed. Clear yellow urine today. Pt continues to pull at catheter. Requires safety mitts. This is a difficult situation. He may continue to require the catheter. With Eliquis and a history of dementia and traumatic foley removal, he will end up at the hospital again.  Will trial twice daily flomax while in hospital.    LOS: 8 days   Elmon Kirschner, NP [facility] Pager: [phone]  07/23/2023, 8:27 AM

## 2023-07-23 NOTE — Consult Note (Signed)
Consultation Note Date: 07/23/2023   Patient Name: [name]  DOB: 06/17/1925  MRN: [id number]  Age / Sex: 89 y.o., male  PCP: Lottie Dawson Referring Physician: Lorin Glass, MD  Reason for Consultation: Establishing goals of care  HPI/Patient Profile: 89 y.o. male    admitted on 07/15/2023    Clinical Assessment and Goals of Care: 89 year old gentleman with advanced dementia, lives at [facility] long-term care, his wife who is also 48 years old he lives at the same facility.  Son Teagon Gentil is the next of kin. Patient has advanced dementia hypertension dyslipidemia coronary artery disease status post MI and stent placement, permanent atrial fibrillation was on Eliquis, CHF CKD and history of BPH status post prior TURP. Patient admitted to hospital medicine service for acute on chronic combined systolic and diastolic congestive heart failure, hypotension, hospital course complicated by ongoing gross hematuria traumatic, patient continues to pull on Foley catheter, urology following.  Cardiology was also consulted.  The patient has ejection fraction 20 to 25%.  Also has a possible demand ischemia and acute on chronic anemia. Palliative care consult for ongoing goals of care discussions has been requested. Chart reviewed, palliative consult request received, patient seen, answers a few questions.  Call placed and was able to reach son Juno Liden.  NEXT OF KIN Son Antonyo Huebel.  SUMMARY OF RECOMMENDATIONS   Goals of care discussions: Call placed and was able to reach patient's son Kenroy Vicencio.  He gave a background on the patient.  Patient has been at [facility] for the last 2 years.  Prior to that he was at [facility] assisted living facility and prior to that he was at home.  Patient is a 46 year old wife also lives with him at [facility] long-term care.  We discussed about  the patient's underlying comorbidities as well as how this hospitalization is going.  Goals wishes and values attempted to be explored.  Overall, patient's family is in favor of the addition of palliative services at [facility].  We discussed about tracking the patient's overall disease trajectory and that the patient will likely require transitioning from palliative to full hospice support in the near future once he is back at the [facility].  The patient's son is in full understanding of the patient's current condition and the projected disease trajectory with his heart failure as well as other comorbidities such as dementia. Recommend addition of palliative services at patient's long-term care facility-[facility].  Continue Foley catheter for comfort. Thank you for the consult  Code Status/Advance Care Planning: DNR   Symptom Management:     Palliative Prophylaxis:  Delirium Protocol  Psycho-social/Spiritual:  Desire for further Chaplaincy support:yes Additional Recommendations: Caregiving  Support/Resources  Prognosis:  Unable to determine  Discharge Planning: Skilled Nursing Facility for rehab with Palliative care service follow-up      Primary Diagnoses: Present on Admission:  CKD (chronic kidney disease) stage 3, GFR 30-59 ml/min (HCC)  Paroxysmal atrial fibrillation (HCC)  Ischemic cardiomyopathy  Sleep apnea  Dementia (HCC)  Coronary artery disease involving native coronary artery of native heart with angina pectoris (HCC)  BPH with obstruction/lower urinary tract symptoms  Chronic systolic CHF (congestive heart failure) (HCC)  Acute on chronic respiratory failure with hypoxia (HCC)  Respiratory failure with hypoxia (HCC)   I have reviewed the medical record, interviewed the patient and family, and examined the patient. The following aspects are pertinent.  Past Medical History:  Diagnosis Date   Anaphylactic reaction 06/02/2016   APPENDECTOMY, HX OF  02/25/2008   Qualifier: Diagnosis of  By: Genelle Gather CMA, [place]     Bilateral carpal tunnel syndrome 08/07/2016   Bilateral hearing loss due to cerumen impaction 04/02/2018   Bilateral impacted cerumen 10/27/2018   Bilateral leg weakness 01/25/2019   BPH (benign prostatic hyperplasia)    BPH with obstruction/lower urinary tract symptoms 02/25/2008   Qualifier: Diagnosis of  By: Genelle Gather CMA, [place]     CARPAL TUNNEL RELEASE, RIGHT, HX OF 02/25/2008   Qualifier: Diagnosis of  By: Genelle Gather CMA, [place]     Chronic sinus bradycardia    Chronic systolic (congestive) heart failure (HCC)    CKD (chronic kidney disease) stage 3, GFR 30-59 ml/min (HCC) 06/02/2016   CKD (chronic kidney disease), stage III (HCC)    Coronary artery disease    Coronary artery disease involving native coronary artery of native heart with angina pectoris (HCC) 02/25/2008   Qualifier: Diagnosis of  By: Genelle Gather CMA, [place]     Cough 11/03/2008   Qualifier: Diagnosis of  By: Debby Bud MD, Rosalyn Gess    DEGENERATIVE JOINT DISEASE, CERVICAL SPINE 02/25/2008   Qualifier: Diagnosis of  By: Genelle Gather CMA, [place]     Diverticulosis    DIVERTICULOSIS, COLON 02/25/2008   Qualifier: Diagnosis of  By: Genelle Gather CMA, [place]     GERD 02/25/2008   Qualifier: Diagnosis of  By: Genelle Gather CMA, [place]     GERD (gastroesophageal reflux disease)    Gross hematuria 01/28/2017   Hip dislocation 01/31/2012   HLD (hyperlipidemia) 02/25/2008   Qualifier: Diagnosis of  By: Genelle Gather CMA, [place]     Hypercholesterolemia    HYPERGLYCEMIA 02/25/2008   Qualifier: History of  By: Genelle Gather CMA, [place]     Hypertension    Hypertensive heart disease with heart failure (HCC) 02/25/2008   Qualifier: Diagnosis of  By: Genelle Gather CMA, [place]     IBS (irritable bowel syndrome)    Ischemic cardiomyopathy 09/21/2018   Kidney stone    Left hip pain    Chronic left hip discomfort   Lipoma of back 08/06/2016   Mechanical instability of hip prosthesis (HCC) 09/06/2015   MYOCARDIAL  INFARCTION 02/25/2008   Qualifier: History of  By: Genelle Gather CMA, [place]     Myocardial infarction (HCC) 2001   Previous anterolateral myocardial infarction with presistent ST-T wave changes   NEPHROLITHIASIS, HX OF 02/25/2008   Qualifier: Diagnosis of  By: Genelle Gather CMA, [place]     Osteoarthritis    Paroxysmal atrial fibrillation (HCC) 09/21/2018   PERCUTANEOUS TRANSLUMINAL CORONARY ANGIOPLASTY, HX OF 02/25/2008   Qualifier: Diagnosis of  By: Genelle Gather CMA, [place]     Primary osteoarthritis of both first carpometacarpal joints 08/07/2016   Primary osteoarthritis of both hands 08/07/2016   Renal calculus 06/30/2013   Sinus bradycardia 11/14/2014   Sleep apnea    uses cpap   SVT (supraventricular tachycardia)    TRIGGER FINGER 02/25/2008   Annotation: right index finger Qualifier: History of  By: Genelle Gather CMA, [place]     Tubular  adenoma    TUBULOVILLOUS ADENOMA, COLON 02/25/2008   Qualifier: History of  By: Genelle Gather CMA, [place]     Unspecified personal history presenting hazards to health 02/25/2008   Centricity Description: POLYPECTOMY, HX OF Qualifier: Diagnosis of  By: Genelle Gather CMA, [place]   Centricity Description: PROSTATECTOMY, TRANSURETHRAL, HX OF Qualifier: Diagnosis of  By: Genelle Gather CMA, [place]     Social History   Socioeconomic History   Marital status: Married    Spouse name: Not on file   Number of children: Not on file   Years of education: Not on file   Highest education level: Not on file  Occupational History   Not on file  Tobacco Use   Smoking status: Never   Smokeless tobacco: Never  Vaping Use   Vaping status: Never Used  Substance and Sexual Activity   Alcohol use: No   Drug use: No   Sexual activity: Never  Other Topics Concern   Not on file  Social History Narrative   Not on file   Social Determinants of Health   Financial Resource Strain: Not on file  Food Insecurity: Not on file  Transportation Needs: Not on file  Physical Activity: Not on file  Stress: Not  on file  Social Connections: Not on file   Family History  Problem Relation Age of Onset   Heart attack Father    Stroke Father    Stroke Mother    Alzheimer's disease Mother    Cancer Sister    Scheduled Meds:  calcium carbonate  1 tablet Oral Daily   Chlorhexidine Gluconate Cloth  6 each Topical Daily   donepezil  10 mg Oral QHS   escitalopram  5 mg Oral Daily   gabapentin  100 mg Oral Q48H   levothyroxine  25 mcg Oral Q0600   lidocaine  1 patch Transdermal Q24H   midodrine  2.5 mg Oral TID WC   psyllium  1 packet Oral Daily   senna  1 tablet Oral BID   simvastatin  10 mg Oral QHS   tamsulosin  0.4 mg Oral BID   torsemide  20 mg Oral Daily   Continuous Infusions:  sodium chloride irrigation     PRN Meds:.melatonin, ondansetron **OR** ondansetron (ZOFRAN) IV, oxyCODONE, sorbitol Medications Prior to Admission:  Prior to Admission medications   Medication Sig Start Date End Date Taking? Authorizing Provider  calcium carbonate (TUMS - DOSED IN MG ELEMENTAL CALCIUM) 500 MG chewable tablet Chew 1 tablet by mouth daily.   Yes [provider]  cholecalciferol (VITAMIN D3) 25 MCG (1000 UNIT) tablet Take 2,000 Units by mouth daily.   Yes [provider]  diclofenac Sodium (VOLTAREN ARTHRITIS PAIN) 1 % GEL Apply 2 g topically 2 (two) times daily.   Yes [provider]  donepezil (ARICEPT) 10 MG tablet Take 10 mg by mouth at bedtime.   Yes [provider]  ELIQUIS 2.5 MG TABS tablet TAKE 1 TABLET BY MOUTH TWICE A DAY Patient taking differently: Take 2.5 mg by mouth 2 (two) times daily. 08/15/20  Yes Baldo Daub, MD  escitalopram (LEXAPRO) 5 MG tablet Take 5 mg by mouth daily.   Yes [provider]  gabapentin (NEURONTIN) 100 MG capsule Take 100 mg by mouth every other day.   Yes [provider]  lidocaine (HM LIDOCAINE PATCH) 4 % Place 1 patch onto the skin daily. Apply to hip and low back   Yes [provider]   Menthol, Topical Analgesic, (  BIOFREEZE COOL THE PAIN) 4 % GEL Apply 1 application  topically 2 (two) times daily.   Yes [provider]  metoprolol succinate (TOPROL-XL) 25 MG 24 hr tablet Take 25 mg by mouth daily. 03/09/21  Yes [provider]  oxyCODONE (ROXICODONE) 5 MG immediate release tablet Take 1 tablet (5 mg total) by mouth every 6 (six) hours as needed for severe pain. Patient taking differently: Take 2.5 mg by mouth every 8 (eight) hours as needed for severe pain. 04/10/21  Yes Adhikari, Willia Craze, MD  Psyllium (METAMUCIL 4 IN 1 FIBER) 43 % POWD Take 1 Dose by mouth daily. 5 ml powder with 8-10 ounces of water and prune juice   Yes [provider]  sacubitril-valsartan (ENTRESTO) 49-51 MG Take 1 tablet by mouth 2 (two) times daily. 06/14/20  Yes Baldo Daub, MD  senna (SENOKOT) 8.6 MG TABS tablet Take 1 tablet (8.6 mg total) by mouth 2 (two) times daily. 04/10/21  Yes Burnadette Pop, MD  simvastatin (ZOCOR) 10 MG tablet Take 10 mg by mouth at bedtime.   Yes [provider]  tamsulosin (FLOMAX) 0.4 MG CAPS capsule Take 0.4 mg by mouth daily. 10/11/19  Yes [provider]  torsemide (DEMADEX) 20 MG tablet Take 1 tablet (20 mg total) by mouth daily. Patient taking differently: Take 20 mg by mouth 2 (two) times daily. 04/13/20  Yes Georgeanna Lea, MD  LORazepam (ATIVAN) 0.5 MG tablet Take 1 tablet (0.5 mg total) by mouth 2 (two) times daily. Patient not taking: Reported on 07/15/2023 04/10/21   Burnadette Pop, MD  polyethylene glycol (MIRALAX / GLYCOLAX) 17 g packet Take 17 g by mouth daily as needed for mild constipation. Patient not taking: Reported on 07/15/2023 04/10/21   Burnadette Pop, MD   Allergies  Allergen Reactions   Beef-Derived Products Hives    Severe hives, nausea, throat swelling   Acetaminophen Other (See Comments)    urticaria urticaria   Alpha-Gal     Per mar   Amiodarone Other (See Comments)    Unknown  Affected my  breathing   Prilosec [Omeprazole]     Diarrhea, stomach cramps   Prednisone Palpitations    REACTION: causes tachycardia, increase BP   Review of Systems +weakness  Physical Exam Pleasant elderly gentleman resting in bed No acute distress Answers a few yes/no questions Has baseline dementia Flat affect Has Foley catheter with clear urine Regular work of breathing S1-S2  Vital Signs: BP (!) 94/58   Pulse 79   Temp 99.5 F (37.5 C) (Oral)   Resp 17   Ht 5\' 5"  (1.651 m)   Wt 68 kg   SpO2 90%   BMI 24.95 kg/m  Pain Scale: Faces POSS *See Group Information*: 1-Acceptable,Awake and alert Pain Score: 0-No pain   SpO2: SpO2: 90 % O2 Device:SpO2: 90 % O2 Flow Rate: .O2 Flow Rate (L/min): 3 L/min  IO: Intake/output summary:  Intake/Output Summary (Last 24 hours) at 07/23/2023 1607 Last data filed at 07/23/2023 1430 Gross per 24 hour  Intake --  Output 2050 ml  Net -2050 ml    LBM: Last BM Date : 07/22/23 Baseline Weight: Weight: 68 kg Most recent weight: Weight: 68 kg     Palliative Assessment/Data:   PPS 40%  Time In:  1530 Time Out:  1630 Time Total:  60  Greater than 50%  of this time was spent counseling and coordinating care related to the above assessment and plan.  Signed by: Rosalin Hawking,  MD   Please contact Palliative Medicine Team phone at [phone] for questions and concerns.  For individual provider: See Loretha Stapler

## 2023-07-23 NOTE — Progress Notes (Signed)
PROGRESS NOTE  [name]  DOB: 1925-10-30  PCP: Fransico Michael [facility] ZOX:[id number]  DOA: 07/15/2023  LOS: 8 days  Hospital Day: 9  Brief narrative: [name] is a 89 y.o. male with PMH significant for advanced dementia HTN, HLD, CAD/MI/stents, permanent A-fib on Eliquis, CHF, CKD, colonic diverticulosis, BPH s/p prior TURP Patient is a long-term resident of [facility] 7/30, patient was sent to the ED for lethargy.  At baseline he "races" around the nursing facility in his wheelchair and he can stand up and go to the bathroom independently.   In ED: O2 sats was in the mid 80s on room air.  Once he was put on oxygen, lethargy improved and patient actually got agitated.   Labs significant for elevated BNP 1862, troponin  67> 104>117, creatinine at 1.7>2.0, CBC stable without leukocytosis. Admitted to [facility] for CHF exacerbation. Cardiology was consulted.  His hospital course was also complicated by gross hematuria likely due to traumatic Foley placement, required urology consultation.  Subjective: Patient was seen and examined this morning.   Pleasant elderly demented male with mittens in hands.  Answer simple yes/no questions.   Foley catheter with clear urine today.   Continues to reach out to catheter.  No family at bedside  Assessment and plan: Acute respiratory failure with hypoxia  Acute on chronic combined systolic and diastolic CHF HypOtension Sent from nursing home for lethargy, hypoxia  Workup with echo showed EF low at 20 to 25%  Cardiology consultation was obtained.   Aggressively diuresed with IV Lasix.  Complicated by hypotension required midodrine. Currently on torsemide 20 mg daily, midodrine 2.5 mg 3 times daily PTA meds-also included Toprol and Entresto.  Currently on hold because of hypotension. Cardiology signed off, suggested palliative care given his advanced age, multiple comorbidities. Net IO Since Admission: -14,315.31  mL [07/23/23 1323] Continue to monitor for daily intake output, weight, blood pressure, BNP, renal function and electrolytes. Recent Labs  Lab 07/19/23 0515 07/20/23 0505 07/21/23 0407 07/22/23 0417 07/23/23 0417  BUN 70* 67* 61* 63* 65*  CREATININE 1.73* 1.58* 1.55* 1.49* 1.61*  NA 133* 131* 131* 131* 129*  K 4.4 4.1 4.4 4.9 4.7   Demand ischemia History of CAD Troponin elevated due to LV strain.  No ischemia workup at this time.   Permanent atrial fibrillation Toprol on hold due to hypotension  Eliquis on hold due to hematuria    Gross hematuria Due to traumatic Foley placement.  Urology was consulted S/p bladder irrigation.  Urine is clear.  Patient continues to pull out catheter. Urology following.  Currently on Flomax 0.4 mg.  Noted increased frequency from daily to twice daily today  Acute on chronic Anemia Baseline hemoglobin above 10.  Hemoglobin acutely low between 8 and 9 due to hematuria.  Stable currently. Continue to monitor Recent Labs    07/19/23 0515 07/20/23 0505 07/21/23 0407 07/22/23 0417 07/23/23 0417  HGB 9.0* 8.0* 8.3* 9.1* 8.5*  MCV 92.1 95.3 94.6 96.4 94.3   AKI on CKD 3B Baseline creatinine 1.5.  Had presented with a creatinine elevated to 2.03 gradually improving.  Advanced dementia Continue donepezil, Lexapro Continue delirium precautions. Fall, aspiration precautions.  Elevated TSH Suspect euthyroid sick.  Free T4 normal. Recheck TSH in 6 weeks   Mobility: Long-term nursing home patient  Goals of care   Code Status: DNR     DVT prophylaxis:  Place and maintain sequential compression device Start: 07/22/23 1647   Antimicrobials: None currently  Fluid: None Consultants: Urology Family Communication: None at bedside  Status: Inpatient Level of care:  Med-Surg   Patient from: Long-term nursing home Anticipated d/c to: Back to long-term nursing home Needs to continue in-hospital care:  After clearance from  urology   Diet:  Diet Order             Diet Heart Room service appropriate? Yes; Fluid consistency: Thin  Diet effective now                   Scheduled Meds:  calcium carbonate  1 tablet Oral Daily   Chlorhexidine Gluconate Cloth  6 each Topical Daily   donepezil  10 mg Oral QHS   escitalopram  5 mg Oral Daily   gabapentin  100 mg Oral Q48H   levothyroxine  25 mcg Oral Q0600   lidocaine  1 patch Transdermal Q24H   midodrine  2.5 mg Oral TID WC   psyllium  1 packet Oral Daily   senna  1 tablet Oral BID   simvastatin  10 mg Oral QHS   tamsulosin  0.4 mg Oral BID   torsemide  20 mg Oral Daily    PRN meds: melatonin, ondansetron **OR** ondansetron (ZOFRAN) IV, oxyCODONE, sorbitol   Infusions:   sodium chloride irrigation      Antimicrobials: Anti-infectives (From admission, onward)    None       Nutritional status:  Body mass index is 24.95 kg/m.          Objective: Vitals:   07/22/23 1935 07/23/23 0529  BP: 109/66 104/64  Pulse: 100 89  Resp: 18 19  Temp: 98.7 F (37.1 C) 98.7 F (37.1 C)  SpO2: 100% 97%    Intake/Output Summary (Last 24 hours) at 07/23/2023 1323 Last data filed at 07/23/2023 0500 Gross per 24 hour  Intake 480 ml  Output 2750 ml  Net -2270 ml   Filed Weights   07/15/23 1310  Weight: 68 kg   Weight change:  Body mass index is 24.95 kg/m.   Physical Exam: General exam: Pleasant, elderly Caucasian male.  Not in physical distress Skin: No rashes, lesions or ulcers. HEENT: Atraumatic, normocephalic, no obvious bleeding Lungs: Clear to auscultation bilaterally CVS: Regular rate and rhythm, no murmur GI/Abd soft, nontender, nondistended, bowel sound present CNS: Somnolent, opens eyes to command.  Answers simple yes/no questions.  Dementia at baseline Psychiatry: Flat affect Extremities: No pedal, no calf tenderness  Data Review: I have personally reviewed the laboratory data and studies available.  F/u labs  ordered Unresulted Labs (From admission, onward)     Start     Ordered   Pending  MRSA Next Gen by PCR, Nasal  Once,   R        Pending            Total time spent in review of labs and imaging, patient evaluation, formulation of plan, documentation and communication with family: 45 minutes  Signed, Lorin Glass, MD [facility] 07/23/2023

## 2023-07-23 NOTE — Care Management Important Message (Signed)
Important Message  Patient Details IM Letter placed in room. Name: [name] MRN: [id number] Date of Birth: 1925/08/08   Medicare Important Message Given:  Yes     Caren Macadam 07/23/2023, 12:39 PM

## 2023-07-24 DIAGNOSIS — J9621 Acute and chronic respiratory failure with hypoxia: Secondary | ICD-10-CM | POA: Diagnosis not present

## 2023-07-24 NOTE — Progress Notes (Addendum)
PROGRESS NOTE  [name]  DOB: 11/02/1925  PCP: Fransico Michael [facility] UXL:[id number]  DOA: 07/15/2023  LOS: 9 days  Hospital Day: 10  Brief narrative: [name] is a 89 y.o. male with PMH significant for advanced dementia HTN, HLD, CAD/MI/stents, permanent A-fib on Eliquis, CHF, CKD, colonic diverticulosis, BPH s/p prior TURP Patient is a long-term resident of [facility] 7/30, patient was sent to the ED for lethargy.  At baseline he "races" around the nursing facility in his wheelchair and he can stand up and go to the bathroom independently.   In ED: O2 sats was in the mid 80s on room air.  Once he was put on oxygen, lethargy improved and patient actually got agitated.   Labs significant for elevated BNP 1862, troponin  67> 104>117, creatinine at 1.7>2.0, CBC stable without leukocytosis. Admitted to [facility] for CHF exacerbation. Cardiology was consulted.  His hospital course was also complicated by gross hematuria likely due to traumatic Foley placement, required urology consultation.  Subjective: Patient was seen and examined this morning.   Pleasant elderly demented male.  Lying down in bed.  Answers simple questions on command.  Not agitated or restless today.  No mittens in hand today.  Foley catheter with clear urine.  Assessment and plan: Acute respiratory failure with hypoxia  Acute on chronic combined systolic and diastolic CHF HypOtension Sent from nursing home for lethargy, hypoxia  Workup with echo showed EF low at 20 to 25%  Cardiology consultation was obtained.   Aggressively diuresed with IV Lasix.  Complicated by hypotension required midodrine. Currently on torsemide 20 mg daily, midodrine 2.5 mg 3 times daily PTA meds-also included Toprol and Entresto.  Currently on hold because of hypotension. Cardiology signed off, suggested palliative care given his advanced age, multiple comorbidities. Net IO Since Admission: -15,735.31 mL  [07/24/23 1209] Continue to monitor for daily intake output, weight, blood pressure, BNP, renal function and electrolytes. Recent Labs  Lab 07/19/23 0515 07/20/23 0505 07/21/23 0407 07/22/23 0417 07/23/23 0417  BUN 70* 67* 61* 63* 65*  CREATININE 1.73* 1.58* 1.55* 1.49* 1.61*  NA 133* 131* 131* 131* 129*  K 4.4 4.1 4.4 4.9 4.7   Demand ischemia History of CAD Troponin elevated due to LV strain.  No ischemia workup at this time.   Permanent atrial fibrillation Toprol on hold due to hypotension  Eliquis on hold due to hematuria    Gross hematuria Due to traumatic Foley placement.  Urology was consulted S/p bladder irrigation.  Urine is clear.  Patient continues to pull out catheter. Urology following.  Currently on Flomax 0.4 mg twice daily.  Acute on chronic Anemia Baseline hemoglobin above 10.  Hemoglobin acutely low between 8 and 9 due to hematuria.  Stable currently. Continue to monitor Recent Labs    07/19/23 0515 07/20/23 0505 07/21/23 0407 07/22/23 0417 07/23/23 0417  HGB 9.0* 8.0* 8.3* 9.1* 8.5*  MCV 92.1 95.3 94.6 96.4 94.3   AKI on CKD 3B Baseline creatinine 1.5.  Had presented with a creatinine elevated to 2.03 gradually improving.  Advanced dementia Continue donepezil, Lexapro Continue delirium precautions. Fall, aspiration precautions.  Elevated TSH Suspect euthyroid sick.  Free T4 normal. Recheck TSH in 6 weeks   Mobility: Long-term nursing home patient  Goals of care   Code Status: DNR.  Palliative care consultation appreciated.  Family interested in keeping the patient comfortable    DVT prophylaxis:  Place and maintain sequential compression device Start: 07/22/23 1647  Antimicrobials: None currently Fluid: None Consultants: Urology Family Communication: None at bedside  Status: Inpatient Level of care:  Med-Surg   Patient from: Long-term nursing home Anticipated d/c to: Back to long-term nursing home after nephrology  clearance.   Diet:  Diet Order             Diet Heart Room service appropriate? Yes; Fluid consistency: Thin  Diet effective now                   Scheduled Meds:  calcium carbonate  1 tablet Oral Daily   Chlorhexidine Gluconate Cloth  6 each Topical Daily   donepezil  10 mg Oral QHS   escitalopram  5 mg Oral Daily   gabapentin  100 mg Oral Q48H   levothyroxine  25 mcg Oral Q0600   lidocaine  1 patch Transdermal Q24H   midodrine  2.5 mg Oral TID WC   psyllium  1 packet Oral Daily   senna  1 tablet Oral BID   simvastatin  10 mg Oral QHS   tamsulosin  0.4 mg Oral BID   torsemide  20 mg Oral Daily    PRN meds: melatonin, ondansetron **OR** ondansetron (ZOFRAN) IV, oxyCODONE, sorbitol   Infusions:   sodium chloride irrigation      Antimicrobials: Anti-infectives (From admission, onward)    None       Nutritional status:  Body mass index is 24.95 kg/m.          Objective: Vitals:   07/23/23 2018 07/24/23 0554  BP: (!) 93/53 99/68  Pulse: 86 93  Resp: 20 18  Temp: 98.8 F (37.1 C) 98 F (36.7 C)  SpO2: 90% 90%    Intake/Output Summary (Last 24 hours) at 07/24/2023 1209 Last data filed at 07/24/2023 0940 Gross per 24 hour  Intake 480 ml  Output 1900 ml  Net -1420 ml   Filed Weights   07/15/23 1310  Weight: 68 kg   Weight change:  Body mass index is 24.95 kg/m.   Physical Exam: General exam: Pleasant, elderly Caucasian male.  Not in physical distress Skin: No rashes, lesions or ulcers. HEENT: Atraumatic, normocephalic, no obvious bleeding Lungs: Clear to auscultation bilaterally CVS: Regular rate and rhythm, no murmur GI/Abd soft, nontender, nondistended, bowel sound present CNS: Alert, awake.  Answers simple yes/no questions.  Dementia at baseline Psychiatry: Flat affect Extremities: No pedal, no calf tenderness  Data Review: I have personally reviewed the laboratory data and studies available.  F/u labs ordered Unresulted Labs  (From admission, onward)     Start     Ordered   Pending  MRSA Next Gen by PCR, Nasal  Once,   R        Pending            Total time spent in review of labs and imaging, patient evaluation, formulation of plan, documentation and communication with family: 45 minutes  Signed, Lorin Glass, MD [facility] 07/24/2023

## 2023-07-24 NOTE — Plan of Care (Signed)
  Problem: Education: Goal: Knowledge of General Education information will improve Description: Including pain rating scale, medication(s)/side effects and non-pharmacologic comfort measures Outcome: Progressing   Problem: Clinical Measurements: Goal: Diagnostic test results will improve Outcome: Progressing Goal: Respiratory complications will improve Outcome: Progressing   Problem: Activity: Goal: Risk for activity intolerance will decrease Outcome: Progressing   Problem: Safety: Goal: Ability to remain free from injury will improve Outcome: Progressing   Problem: Skin Integrity: Goal: Risk for impaired skin integrity will decrease Outcome: Progressing

## 2023-07-24 NOTE — Plan of Care (Signed)

## 2023-07-25 DIAGNOSIS — J9621 Acute and chronic respiratory failure with hypoxia: Secondary | ICD-10-CM | POA: Diagnosis not present

## 2023-07-25 LAB — BASIC METABOLIC PANEL
Anion gap: 10 (ref 5–15)
BUN: 59 mg/dL — ABNORMAL HIGH (ref 8–23)
CO2: 25 mmol/L (ref 22–32)
Calcium: 9 mg/dL (ref 8.9–10.3)
Chloride: 96 mmol/L — ABNORMAL LOW (ref 98–111)
Creatinine, Ser: 1.57 mg/dL — ABNORMAL HIGH (ref 0.61–1.24)
GFR, Estimated: 40 mL/min — ABNORMAL LOW (ref >60)
Glucose, Bld: 95 mg/dL (ref 70–99)
Potassium: 4.3 mmol/L (ref 3.5–5.1)
Sodium: 131 mmol/L — ABNORMAL LOW (ref 135–145)

## 2023-07-25 LAB — CBC
HCT: 24.6 % — ABNORMAL LOW (ref 39.0–52.0)
Hemoglobin: 8 g/dL — ABNORMAL LOW (ref 13.0–17.0)
MCH: 30.1 pg (ref 26.0–34.0)
MCHC: 32.5 g/dL (ref 30.0–36.0)
MCV: 92.5 fL (ref 80.0–100.0)
Platelets: 169 10*3/uL (ref 150–400)
RBC: 2.66 MIL/uL — ABNORMAL LOW (ref 4.22–5.81)
RDW: 16.8 % — ABNORMAL HIGH (ref 11.5–15.5)
WBC: 6.6 10*3/uL (ref 4.0–10.5)
nRBC: 0 % (ref 0.0–0.2)

## 2023-07-25 LAB — MRSA NEXT GEN BY PCR, NASAL: MRSA by PCR Next Gen: NOT DETECTED

## 2023-07-25 MED ORDER — LEVOTHYROXINE SODIUM 25 MCG PO TABS: 25.0000 ug | ORAL_TABLET | Freq: Every day | ORAL | Status: DC

## 2023-07-25 MED ORDER — MIDODRINE HCL 2.5 MG PO TABS: 2.5000 mg | ORAL_TABLET | Freq: Three times a day (TID) | ORAL | Status: DC

## 2023-07-25 MED ORDER — MELATONIN 3 MG PO TABS: 3.0000 mg | ORAL_TABLET | Freq: Every evening | ORAL | Status: DC | PRN

## 2023-07-25 NOTE — Progress Notes (Signed)
Called [facility] to give report, call forwarded and SW answered phone, I gave direct number for her to give nurse to call back when available

## 2023-07-25 NOTE — Plan of Care (Signed)

## 2023-07-25 NOTE — TOC Transition Note (Signed)
Transition of Care [facility]) - CM/SW Discharge Note   Patient Details  Name: [name] MRN: [id number] Date of Birth: 29-May-1925  Transition of Care [facility]) CM/SW Contact:  Larrie Kass, LCSW Phone Number: 07/25/2023, 2:43 PM   Clinical Narrative:     Pt to return to [facility] , Rm assignment 301B call report [phone]. PTAR called no further TOC needs TOC sign off.    Final next level of care: Long Term Nursing Home Barriers to Discharge: No Barriers Identified   Patient Goals and CMS Choice      Discharge Placement                  Patient to be transferred to facility by: E[facility]   Patient and family notified of of transfer: 07/25/23  Discharge Plan and Services Additional resources added to the After Visit Summary for                                       Social Determinants of Health (SDOH) Interventions SDOH Screenings   Housing: Patient Unable To Answer (07/23/2023)  Tobacco Use: Low Risk  (07/23/2023)     Readmission Risk Interventions     No data to display

## 2023-07-25 NOTE — Progress Notes (Signed)
     Subjective: NAEON. Pt repeating that he has to pee.   Objective: Vital signs in last 24 hours: Temp:  [97.7 F (36.5 C)-98.1 F (36.7 C)] 97.7 F (36.5 C) (08/09 0610) Pulse Rate:  [81-93] 93 (08/09 0610) Resp:  [20] 20 (08/09 0610) BP: (101-105)/(64-66) 105/65 (08/09 0610) SpO2:  [97 %-100 %] 100 % (08/09 0610)  Intake/Output from previous day: 08/08 0701 - 08/09 0700 In: 720 [P.O.:720] Out: 2100 [Urine:2100]  Intake/Output this shift: No intake/output data recorded.  Physical Exam:  General: Alert and oriented CV: No cyanosis Lungs: equal chest rise GU: 31f coude with clear yellow urine.   Lab Results: Recent Labs    07/23/23 0417  HGB 8.5*  HCT 26.3*   BMET Recent Labs    07/23/23 0417  NA 129*  K 4.7  CL 96*  CO2 21*  GLUCOSE 99  BUN 65*  CREATININE 1.61*  CALCIUM 8.6*     Studies/Results: No results found.  Assessment/Plan: #traumatic foley removal #hematuria- resolved TOV 8/5, failed with PVR of 750. Coude placed on 8/6.  Titrated up to twice daily flomax when coude was replaced. Can consider another voiding trial after about a week of higher dose. Please contact urology with whoever will be his point of contact when discharging, so that we can help schedule his voiding trial, or catheter exchanges if foley is to remain for palliation.  Creatinine keeping with his baseline.    LOS: 10 days   Elmon Kirschner, NP [facility] Pager: [phone]  07/25/2023, 8:28 AM

## 2023-07-25 NOTE — Discharge Summary (Signed)
Physician Discharge Summary  Cheron Neyer Class ZOX:[id number] DOB: 1925-10-15 DOA: 07/15/2023  PCP: Fransico Michael [place]  Admit date: 07/15/2023 Discharge date: 07/25/2023  Admitted From: [facility] Discharge disposition: Back to facility   Brief narrative: [name] is a 89 y.o. male with PMH significant for advanced dementia HTN, HLD, CAD/MI/stents, permanent A-fib on Eliquis, CHF, CKD, colonic diverticulosis, BPH s/p prior TURP Patient is a long-term resident of [facility] 7/30, patient was sent to the ED for lethargy.  At baseline he "races" around the nursing facility in his wheelchair and he can stand up and go to the bathroom independently.   In ED: O2 sats was in the mid 80s on room air.  Once he was put on oxygen, lethargy improved and patient actually got agitated.   Labs significant for elevated BNP 1862, troponin  67> 104>117, creatinine at 1.7>2.0, CBC stable without leukocytosis. Admitted to [facility] for CHF exacerbation. Cardiology was consulted.  His hospital course was also complicated by gross hematuria likely due to traumatic Foley placement, required urology consultation.  Subjective: Patient was seen and examined this morning.   Pleasant elderly Caucasian male with dementia.  Taking his breakfast.  Not in distress.  Foley catheter with clear urine.  Assessment and plan: Acute respiratory failure with hypoxia  Acute on chronic combined systolic and diastolic CHF HypOtension Sent from nursing home for lethargy, hypoxia  Workup with echo showed EF low at 20 to 25%  Cardiology consultation was obtained.   Aggressively diuresed with IV Lasix.  Complicated by hypotension required midodrine. Currently on torsemide 20 mg daily, midodrine 2.5 mg 3 times daily PTA meds-also included Toprol and Entresto.  Currently on hold because of hypotension. Cardiology signed off, suggested palliative care given his advanced age, multiple  comorbidities. Net IO Since Admission: -17,555.31 mL [07/25/23 1433] Continue to monitor for daily intake output, weight, blood pressure, BNP, renal function and electrolytes. Recent Labs  Lab 07/20/23 0505 07/21/23 0407 07/22/23 0417 07/23/23 0417 07/25/23 0845  BUN 67* 61* 63* 65* 59*  CREATININE 1.58* 1.55* 1.49* 1.61* 1.57*  NA 131* 131* 131* 129* 131*  K 4.1 4.4 4.9 4.7 4.3   Demand ischemia History of CAD Troponin elevated due to LV strain.  No ischemia workup at this time.   Permanent atrial fibrillation Toprol on hold due to hypotension  Eliquis on hold due to hematuria    Gross hematuria Due to traumatic Foley placement.  Urology was consulted S/p bladder irrigation.  Urine is clear.  Currently on Flomax 0.4 mg twice daily. Per urology, to discharge on Foley catheter for now.  Can consider another voiding trial in about a week.  To follow-up with [facility] as an outpatient.  Acute on chronic Anemia Baseline hemoglobin above 10.  Hemoglobin acutely low between 8 and 9 due to hematuria.  Stable currently. Continue to monitor Recent Labs    07/20/23 0505 07/21/23 0407 07/22/23 0417 07/23/23 0417 07/25/23 0845  HGB 8.0* 8.3* 9.1* 8.5* 8.0*  MCV 95.3 94.6 96.4 94.3 92.5   AKI on CKD 3B Baseline creatinine 1.5.  Had presented with a creatinine elevated to 2.03 gradually improving.  Advanced dementia Continue donepezil, Lexapro Continue delirium precautions. Fall, aspiration precautions.  Elevated TSH Suspect euthyroid sick.  Free T4 normal. Recheck TSH in 6 weeks   Goals of care   Code Status: DNR.  Palliative care consultation appreciated.  Family interested in keeping the patient comfortable  Wounds:  - Wound /  Incision (Open or Dehisced) 07/16/23 Skin tear Arm Left (Active)  Date First Assessed/Time First Assessed: 07/16/23 2240   Wound Type: Skin tear  Location: Arm  Location Orientation: Left  Present on Admission: Yes    Assessments  07/16/2023 10:40 PM 07/24/2023 12:20 PM  Dressing Type Thin film Foam - Lift dressing to assess site every shift  Dressing Changed -- Changed  Dressing Status -- Clean, Dry, Intact  Dressing Change Frequency -- PRN  Site / Wound Assessment Bleeding Dry  Peri-wound Assessment Intact Intact     No associated orders.    Discharge Exam:   Vitals:   07/24/23 1209 07/24/23 2114 07/25/23 0610 07/25/23 1319  BP: 101/64 105/66 105/65 111/80  Pulse: 81 81 93 82  Resp:  20 20   Temp: 98.1 F (36.7 C) 97.7 F (36.5 C) 97.7 F (36.5 C) 97.7 F (36.5 C)  TempSrc: Oral Oral Oral Oral  SpO2: 97% 100% 100% 100%  Weight:      Height:        Body mass index is 24.95 kg/m.   General exam: Pleasant, elderly Caucasian male.  Not in physical distress.  Foley catheter with clear urine Skin: No rashes, lesions or ulcers. HEENT: Atraumatic, normocephalic, no obvious bleeding Lungs: Clear to auscultation bilaterally CVS: Regular rate and rhythm, no murmur GI/Abd soft, nontender, nondistended, bowel sound present CNS: Alert, awake.  Answers simple yes/no questions.  Dementia at baseline Psychiatry: Flat affect Extremities: No pedal, no calf tenderness  Follow ups:    Follow-up Information     [place] Follow up.   Specialty: Assisted Living Facility Contact information: [address] [place] [address] [phone]                 Discharge Instructions:   Discharge Instructions     Call MD for:  difficulty breathing, headache or visual disturbances   Complete by: As directed    Call MD for:  extreme fatigue   Complete by: As directed    Call MD for:  hives   Complete by: As directed    Call MD for:  persistant dizziness or light-headedness   Complete by: As directed    Call MD for:  persistant nausea and vomiting   Complete by: As directed    Call MD for:  severe uncontrolled pain   Complete by: As directed    Call MD for:  temperature  >100.4   Complete by: As directed    Diet general   Complete by: As directed    Discharge instructions   Complete by: As directed    General discharge instructions: Follow with Primary MD Fransico Michael [facility] in 7 days  Please request your PCP  to go over your hospital tests, procedures, radiology results at the follow up. Please get your medicines reviewed and adjusted.  Your PCP may decide to repeat certain labs or tests as needed. Do not drive, operate heavy machinery, perform activities at heights, swimming or participation in water activities or provide baby sitting services if your were admitted for syncope or siezures until you have seen by Primary MD or a Neurologist and advised to do so again. [place] Controlled Substance Reporting System database was reviewed. Do not drive, operate heavy machinery, perform activities at heights, swim, participate in water activities or provide baby-sitting services while on medications for pain, sleep and mood until your outpatient physician has reevaluated you and advised to do so again.  You  are strongly recommended to comply with the dose, frequency and duration of prescribed medications. Activity: As tolerated with Full fall precautions use walker/cane & assistance as needed Avoid using any recreational substances like cigarette, tobacco, alcohol, or non-prescribed drug. If you experience worsening of your admission symptoms, develop shortness of breath, life threatening emergency, suicidal or homicidal thoughts you must seek medical attention immediately by calling 911 or calling your MD immediately  if symptoms less severe. You must read complete instructions/literature along with all the possible adverse reactions/side effects for all the medicines you take and that have been prescribed to you. Take any new medicine only after you have completely understood and accepted all the possible adverse reactions/side effects.  Wear Seat  belts while driving. You were cared for by a hospitalist during your hospital stay. If you have any questions about your discharge medications or the care you received while you were in the hospital after you are discharged, you can call the unit and ask to speak with the hospitalist or the covering physician. Once you are discharged, your primary care physician will handle any further medical issues. Please note that NO REFILLS for any discharge medications will be authorized once you are discharged, as it is imperative that you return to your primary care physician (or establish a relationship with a primary care physician if you do not have one).   Discharge wound care:   Complete by: As directed    Increase activity slowly   Complete by: As directed        Discharge Medications:   Allergies as of 07/25/2023       Reactions   Beef-derived Products Hives   Severe hives, nausea, throat swelling   Acetaminophen Other (See Comments)   urticaria urticaria   Alpha-gal    Per mar   Amiodarone Other (See Comments)   Unknown Affected my breathing   Prilosec [omeprazole]    Diarrhea, stomach cramps   Prednisone Palpitations   REACTION: causes tachycardia, increase BP        Medication List     STOP taking these medications    Eliquis 2.5 MG Tabs tablet Generic drug: apixaban   Entresto 49-51 MG Generic drug: sacubitril-valsartan   LORazepam 0.5 MG tablet Commonly known as: ATIVAN   metoprolol succinate 25 MG 24 hr tablet Commonly known as: TOPROL-XL       TAKE these medications    Biofreeze Cool The Pain 4 % Gel Generic drug: Menthol (Topical Analgesic) Apply 1 application  topically 2 (two) times daily.   calcium carbonate 500 MG chewable tablet Commonly known as: TUMS - dosed in mg elemental calcium Chew 1 tablet by mouth daily.   cholecalciferol 25 MCG (1000 UNIT) tablet Commonly known as: VITAMIN D3 Take 2,000 Units by mouth daily.   donepezil 10 MG  tablet Commonly known as: ARICEPT Take 10 mg by mouth at bedtime.   escitalopram 5 MG tablet Commonly known as: LEXAPRO Take 5 mg by mouth daily.   gabapentin 100 MG capsule Commonly known as: NEURONTIN Take 100 mg by mouth every other day.   HM Lidocaine Patch 4 % Generic drug: lidocaine Place 1 patch onto the skin daily. Apply to hip and low back   levothyroxine 25 MCG tablet Commonly known as: SYNTHROID Take 1 tablet (25 mcg total) by mouth daily at 6 (six) AM. Start taking on: July 26, 2023   melatonin 3 MG Tabs tablet Take 1 tablet (3 mg total) by mouth at bedtime  as needed.   Metamucil 4 in 1 Fiber 43 % Powd Generic drug: Psyllium Take 1 Dose by mouth daily. 5 ml powder with 8-10 ounces of water and prune juice   midodrine 2.5 MG tablet Commonly known as: PROAMATINE Take 1 tablet (2.5 mg total) by mouth 3 (three) times daily with meals.   oxyCODONE 5 MG immediate release tablet Commonly known as: Roxicodone Take 1 tablet (5 mg total) by mouth every 6 (six) hours as needed for severe pain. What changed:  how much to take when to take this   polyethylene glycol 17 g packet Commonly known as: MIRALAX / GLYCOLAX Take 17 g by mouth daily as needed for mild constipation.   senna 8.6 MG Tabs tablet Commonly known as: SENOKOT Take 1 tablet (8.6 mg total) by mouth 2 (two) times daily.   simvastatin 10 MG tablet Commonly known as: ZOCOR Take 10 mg by mouth at bedtime.   tamsulosin 0.4 MG Caps capsule Commonly known as: FLOMAX Take 0.4 mg by mouth daily.   torsemide 20 MG tablet Commonly known as: DEMADEX Take 1 tablet (20 mg total) by mouth daily. What changed: when to take this   Voltaren Arthritis Pain 1 % Gel Generic drug: diclofenac Sodium Apply 2 g topically 2 (two) times daily.               Discharge Care Instructions  (From admission, onward)           Start     Ordered   07/25/23 0000  Discharge wound care:        07/25/23 1433              The results of significant diagnostics from this hospitalization (including imaging, microbiology, ancillary and laboratory) are listed below for reference.    Procedures and Diagnostic Studies:   VAS [place] LOWER EXTREMITY VENOUS (DVT)  Result Date: 07/16/2023  Lower Venous DVT Study Patient Name:  HURIEL PORTER  Date of Exam:   07/16/2023 Medical Rec #: [id number]         Accession #:    [id number] Date of Birth: 1925/01/12        Patient Gender: M Patient Age:   15 years Exam Location:  [facility] Procedure:      VAS [place] LOWER EXTREMITY VENOUS (DVT) Referring Phys: CLAUDIA CLAIBORNE --------------------------------------------------------------------------------  Indications: Swelling.  Risk Factors: None identified. Limitations: Poor ultrasound/tissue interface and patient positioning. Comparison Study: No prior studies. Performing Technologist: Chanda Busing RVT  Examination Guidelines: A complete evaluation includes B-mode imaging, spectral Doppler, color Doppler, and power Doppler as needed of all accessible portions of each vessel. Bilateral testing is considered an integral part of a complete examination. Limited examinations for reoccurring indications may be performed as noted. The reflux portion of the exam is performed with the patient in reverse Trendelenburg.  +-----+---------------+---------+-----------+----------+--------------+ RIGHTCompressibilityPhasicitySpontaneityPropertiesThrombus Aging +-----+---------------+---------+-----------+----------+--------------+ CFV  Full           Yes      Yes                                 +-----+---------------+---------+-----------+----------+--------------+   +---------+---------------+---------+-----------+----------+--------------+ LEFT     CompressibilityPhasicitySpontaneityPropertiesThrombus Aging +---------+---------------+---------+-----------+----------+--------------+ CFV      Full            Yes      Yes                                 +---------+---------------+---------+-----------+----------+--------------+  SFJ      Full                                                        +---------+---------------+---------+-----------+----------+--------------+ FV Prox  Full                                                        +---------+---------------+---------+-----------+----------+--------------+ FV Mid   Full                                                        +---------+---------------+---------+-----------+----------+--------------+ FV DistalFull           Yes      Yes                                 +---------+---------------+---------+-----------+----------+--------------+ PFV      Full                                                        +---------+---------------+---------+-----------+----------+--------------+ POP      Full           Yes      Yes                                 +---------+---------------+---------+-----------+----------+--------------+ PTV      Full                                                        +---------+---------------+---------+-----------+----------+--------------+ PERO     Full                                                        +---------+---------------+---------+-----------+----------+--------------+    Summary: RIGHT: - No evidence of common femoral vein obstruction.  LEFT: - There is no evidence of deep vein thrombosis in the lower extremity. However, portions of this examination were limited- see technologist comments above.  - No cystic structure found in the popliteal fossa.  *See table(s) above for measurements and observations. Electronically signed by Heath Lark on 07/16/2023 at 5:27:54 PM.    Final    ECHOCARDIOGRAM COMPLETE  Result Date: 07/16/2023    ECHOCARDIOGRAM REPORT   Patient Name:   JENNIE MCNAIR Date of Exam: 07/16/2023 Medical Rec #:  [id number]        Height:        65.0  in Accession #:    [id number]       Weight:       149.9 lb Date of Birth:  01/28/1925       BSA:          1.750 m Patient Age:    89 years         BP:           100/69 mmHg Patient Gender: M                HR:           85 bpm. Exam Location:  Inpatient Procedure: 2D Echo, Cardiac Doppler, Color Doppler and Intracardiac            Opacification Agent REPORT CONTAINS CRITICAL RESULT Indications:    CHF Acute Systolic  History:        Patient has no prior history of Echocardiogram examinations.                 CAD; Arrythmias:Atrial Fibrillation. CKD, severe dementia.  Sonographer:    Milda Smart Referring Phys: [phone] JESSICA U VANN  Sonographer Comments: Image acquisition challenging due to respiratory motion. IMPRESSIONS  1. Left ventricular ejection fraction, by estimation, is 20 to 25%. The left ventricle has severely decreased function. The left ventricle demonstrates global hypokinesis. Left ventricular diastolic parameters are consistent with Grade III diastolic dysfunction (restrictive).  2. Right ventricular systolic function is moderately reduced. The right ventricular size is moderately enlarged. There is severely elevated pulmonary artery systolic pressure. The estimated right ventricular systolic pressure is 68.0 mmHg.  3. Left atrial size was severely dilated.  4. Right atrial size was severely dilated.  5. The mitral valve is normal in structure. Moderate mitral valve regurgitation. No evidence of mitral stenosis.  6. Tricuspid valve regurgitation is severe.  7. The aortic valve is calcified. Aortic valve regurgitation is mild. No aortic stenosis is present.  8. The inferior vena cava is normal in size with greater than 50% respiratory variability, suggesting right atrial pressure of 3 mmHg. FINDINGS  Left Ventricle: Left ventricular ejection fraction, by estimation, is 20 to 25%. The left ventricle has severely decreased function. The left ventricle demonstrates global hypokinesis. Definity  contrast agent was given IV to delineate the left ventricular endocardial borders. The left ventricular internal cavity size was normal in size. There is no left ventricular hypertrophy. Left ventricular diastolic parameters are consistent with Grade III diastolic dysfunction (restrictive).  LV Wall Scoring: The apical septal segment, apical inferior segment, and apex are akinetic. Right Ventricle: The right ventricular size is moderately enlarged. No increase in right ventricular wall thickness. Right ventricular systolic function is moderately reduced. There is severely elevated pulmonary artery systolic pressure. The tricuspid regurgitant velocity is 4.03 m/s, and with an assumed right atrial pressure of 3 mmHg, the estimated right ventricular systolic pressure is 68.0 mmHg. Left Atrium: Left atrial size was severely dilated. Right Atrium: Right atrial size was severely dilated. Pericardium: There is no evidence of pericardial effusion. Mitral Valve: The mitral valve is normal in structure. Moderate mitral valve regurgitation. No evidence of mitral valve stenosis. Tricuspid Valve: The tricuspid valve is normal in structure. Tricuspid valve regurgitation is severe. No evidence of tricuspid stenosis. Aortic Valve: The aortic valve is calcified. Aortic valve regurgitation is mild. No aortic stenosis is present. Pulmonic Valve: The pulmonic valve was normal in structure. Pulmonic valve regurgitation is mild. No evidence of pulmonic stenosis. Aorta: The aortic root is normal  in size and structure. Venous: The inferior vena cava is normal in size with greater than 50% respiratory variability, suggesting right atrial pressure of 3 mmHg. IAS/Shunts: No atrial level shunt detected by color flow Doppler.  LEFT VENTRICLE PLAX 2D LVIDd:         5.30 cm      Diastology LVIDs:         4.50 cm      LV e' medial:    2.31 cm/s LV PW:         1.30 cm      LV E/e' medial:  35.6 LV IVS:        1.10 cm      LV e' lateral:   5.80 cm/s  LVOT diam:     2.00 cm      LV E/e' lateral: 14.2 LV SV:         48 LV SV Index:   27 LVOT Area:     3.14 cm  LV Volumes (MOD) LV vol d, MOD A2C: 123.0 ml LV vol d, MOD A4C: 141.0 ml LV vol s, MOD A2C: 80.0 ml LV vol s, MOD A4C: 90.1 ml LV SV MOD A2C:     43.0 ml LV SV MOD A4C:     141.0 ml LV SV MOD BP:      49.3 ml RIGHT VENTRICLE RV Basal diam:  4.40 cm RV Mid diam:    3.10 cm RV S prime:     4.78 cm/s TAPSE (M-mode): 1.0 cm LEFT ATRIUM              Index        RIGHT ATRIUM           Index LA diam:        5.10 cm  2.91 cm/m   RA Area:     32.30 cm LA Vol (A2C):   128.5 ml 73.43 ml/m  RA Volume:   123.00 ml 70.28 ml/m LA Vol (A4C):   105.4 ml 60.23 ml/m LA Biplane Vol: 126.0 ml 72.00 ml/m  AORTIC VALVE LVOT Vmax:   84.10 cm/s LVOT Vmean:  59.100 cm/s LVOT VTI:    0.152 m  AORTA Ao Root diam: 3.30 cm Ao Asc diam:  3.60 cm MITRAL VALVE                  TRICUSPID VALVE MV Area (PHT): 6.96 cm       TR Peak grad:   65.0 mmHg MV Decel Time: 109 msec       TR Mean grad:   38.0 mmHg MR Peak grad:    70.6 mmHg    TR Vmax:        403.00 cm/s MR Mean grad:    51.0 mmHg    TR Vmean:       283.0 cm/s MR Vmax:         420.00 cm/s MR Vmean:        339.0 cm/s   SHUNTS MR PISA:         1.27 cm     Systemic VTI:  0.15 m MR PISA Eff ROA: 12 mm       Systemic Diam: 2.00 cm MR PISA Radius:  0.45 cm MV E velocity: 82.30 cm/s Donato Schultz MD Electronically signed by Donato Schultz MD Signature Date/Time: 07/16/2023/2:48:23 PM    Final    DG Chest 2 View  Result Date: 07/15/2023 CLINICAL DATA:  Shortness of breath EXAM: CHEST -  2 VIEW COMPARISON:  X-ray 06/30/2023 FINDINGS: Enlarged cardiopericardial silhouette. Enlarged pulmonary artery centrally. Small pleural effusions with some adjacent opacities. Decreasing from previous. No pneumothorax calcified aorta. Degenerative changes of the spine on lateral view. IMPRESSION: Decreasing pleural effusions and adjacent opacities. Significant residual. Enlarged heart with enlarged  central pulmonary vessels. Electronically Signed   By: Karen Kays M.D.   On: 07/15/2023 14:00     Labs:   Basic Metabolic Panel: Recent Labs  Lab 07/20/23 0505 07/21/23 0407 07/22/23 0417 07/23/23 0417 07/25/23 0845  NA 131* 131* 131* 129* 131*  K 4.1 4.4 4.9 4.7 4.3  CL 101 100 99 96* 96*  CO2 21* 24 23 21* 25  GLUCOSE 103* 97 110* 99 95  BUN 67* 61* 63* 65* 59*  CREATININE 1.58* 1.55* 1.49* 1.61* 1.57*  CALCIUM 8.5* 8.6* 9.1 8.6* 9.0   GFR Estimated Creatinine Clearance: 24.5 mL/min (A) (by C-G formula based on SCr of 1.57 mg/dL (H)). Liver Function Tests: Recent Labs  Lab 07/18/23 2004  AST 27  ALT 18  ALKPHOS 98  BILITOT 0.9  PROT 5.0*  ALBUMIN 2.4*   No results for input(s): "LIPASE", "AMYLASE" in the last 168 hours. No results for input(s): "AMMONIA" in the last 168 hours. Coagulation profile No results for input(s): "INR", "PROTIME" in the last 168 hours.  CBC: Recent Labs  Lab 07/20/23 0505 07/21/23 0407 07/22/23 0417 07/23/23 0417 07/25/23 0845  WBC 7.1 6.4 7.7 9.3 6.6  HGB 8.0* 8.3* 9.1* 8.5* 8.0*  HCT 26.1* 26.2* 29.5* 26.3* 24.6*  MCV 95.3 94.6 96.4 94.3 92.5  PLT 145* 136* 173 164 169   Cardiac Enzymes: No results for input(s): "CKTOTAL", "CKMB", "CKMBINDEX", "TROPONINI" in the last 168 hours. BNP: Invalid input(s): "POCBNP" CBG: No results for input(s): "GLUCAP" in the last 168 hours. D-Dimer No results for input(s): "DDIMER" in the last 72 hours. Hgb A1c No results for input(s): "HGBA1C" in the last 72 hours. Lipid Profile No results for input(s): "CHOL", "HDL", "LDLCALC", "TRIG", "CHOLHDL", "LDLDIRECT" in the last 72 hours. Thyroid function studies No results for input(s): "TSH", "T4TOTAL", "T3FREE", "THYROIDAB" in the last 72 hours.  Invalid input(s): "FREET3" Anemia work up No results for input(s): "VITAMINB12", "FOLATE", "FERRITIN", "TIBC", "IRON", "RETICCTPCT" in the last 72 hours. Microbiology Recent Results (from the  past 240 hour(s))  Urine Culture     Status: Abnormal   Collection Time: 07/15/23  5:16 PM   Specimen: Urine, Clean Catch  Result Value Ref Range Status   Specimen Description   Final    URINE, CLEAN CATCH Performed at [facility], [address], [place], [place] [address]    Special Requests   Final    NONE Performed at [facility], [address], [place], [place] [address]    Culture MULTIPLE SPECIES PRESENT, SUGGEST RECOLLECTION (A)  Final   Report Status 07/17/2023 FINAL  Final  Urine Culture (for pregnant, neutropenic or urologic patients or patients with an indwelling urinary catheter)     Status: None   Collection Time: 07/18/23  4:31 PM   Specimen: Urine, Catheterized  Result Value Ref Range Status   Specimen Description   Final    URINE, CATHETERIZED Performed at [facility], [address] [place], [place] [address]    Special Requests   Final    NONE Performed at [place] [facility], [address], [place], [place] [address]    Culture   Final  NO GROWTH Performed at [facility], [address], [place], [place] [address]    Report Status 07/20/2023 FINAL  Final    Time coordinating discharge: 45 minutes  Signed:    [facility] 07/25/2023, 2:33 PM

## 2023-08-12 ENCOUNTER — Ambulatory Visit: Payer: Medicare Other | Admitting: Cardiology

## 2023-11-16 DEATH — deceased
